# Patient Record
Sex: Male | Born: 1935 | Race: White | Hispanic: No | Marital: Married | Smoking: Former smoker
Health system: Southern US, Community
[De-identification: ages and names within clinical notes are randomized; demographics above are authoritative.]

## PROBLEM LIST (undated history)

## (undated) DIAGNOSIS — M545 Low back pain, unspecified: Secondary | ICD-10-CM

## (undated) DIAGNOSIS — N189 Chronic kidney disease, unspecified: Secondary | ICD-10-CM

## (undated) DIAGNOSIS — Z8679 Personal history of other diseases of the circulatory system: Secondary | ICD-10-CM

## (undated) DIAGNOSIS — N1330 Unspecified hydronephrosis: Secondary | ICD-10-CM

## (undated) DIAGNOSIS — R972 Elevated prostate specific antigen [PSA]: Secondary | ICD-10-CM

## (undated) DIAGNOSIS — Z8551 Personal history of malignant neoplasm of bladder: Secondary | ICD-10-CM

## (undated) DIAGNOSIS — D509 Iron deficiency anemia, unspecified: Secondary | ICD-10-CM

## (undated) DIAGNOSIS — K5909 Other constipation: Secondary | ICD-10-CM

## (undated) DIAGNOSIS — Z978 Presence of other specified devices: Secondary | ICD-10-CM

## (undated) DIAGNOSIS — G8929 Other chronic pain: Secondary | ICD-10-CM

## (undated) DIAGNOSIS — N401 Enlarged prostate with lower urinary tract symptoms: Secondary | ICD-10-CM

## (undated) DIAGNOSIS — M1990 Unspecified osteoarthritis, unspecified site: Secondary | ICD-10-CM

## (undated) DIAGNOSIS — L853 Xerosis cutis: Secondary | ICD-10-CM

## (undated) DIAGNOSIS — Z973 Presence of spectacles and contact lenses: Secondary | ICD-10-CM

## (undated) DIAGNOSIS — N138 Other obstructive and reflux uropathy: Secondary | ICD-10-CM

## (undated) DIAGNOSIS — R399 Unspecified symptoms and signs involving the genitourinary system: Secondary | ICD-10-CM

## (undated) DIAGNOSIS — J302 Other seasonal allergic rhinitis: Secondary | ICD-10-CM

## (undated) DIAGNOSIS — Z8619 Personal history of other infectious and parasitic diseases: Secondary | ICD-10-CM

## (undated) DIAGNOSIS — C679 Malignant neoplasm of bladder, unspecified: Secondary | ICD-10-CM

## (undated) DIAGNOSIS — N402 Nodular prostate without lower urinary tract symptoms: Secondary | ICD-10-CM

## (undated) DIAGNOSIS — R4189 Other symptoms and signs involving cognitive functions and awareness: Secondary | ICD-10-CM

## (undated) DIAGNOSIS — Z860100 Personal history of colon polyps, unspecified: Secondary | ICD-10-CM

## (undated) DIAGNOSIS — R339 Retention of urine, unspecified: Secondary | ICD-10-CM

## (undated) DIAGNOSIS — M48061 Spinal stenosis, lumbar region without neurogenic claudication: Secondary | ICD-10-CM

## (undated) HISTORY — PX: OTHER SURGICAL HISTORY: SHX169

## (undated) HISTORY — PX: HERNIA REPAIR: SHX51

---

## 2000-08-17 ENCOUNTER — Encounter (INDEPENDENT_AMBULATORY_CARE_PROVIDER_SITE_OTHER): Payer: Self-pay | Admitting: Specialist

## 2000-08-17 ENCOUNTER — Ambulatory Visit (HOSPITAL_COMMUNITY): Admission: RE | Admit: 2000-08-17 | Discharge: 2000-08-17 | Payer: Self-pay | Admitting: Gastroenterology

## 2003-06-04 ENCOUNTER — Encounter: Payer: Self-pay | Admitting: Internal Medicine

## 2003-06-13 ENCOUNTER — Encounter: Payer: Self-pay | Admitting: Internal Medicine

## 2004-02-26 ENCOUNTER — Ambulatory Visit: Payer: Self-pay | Admitting: Internal Medicine

## 2004-06-02 ENCOUNTER — Ambulatory Visit: Payer: Self-pay | Admitting: Internal Medicine

## 2004-10-30 ENCOUNTER — Encounter: Payer: Self-pay | Admitting: Internal Medicine

## 2005-01-12 ENCOUNTER — Ambulatory Visit: Payer: Self-pay | Admitting: Internal Medicine

## 2005-02-23 ENCOUNTER — Ambulatory Visit: Payer: Self-pay | Admitting: Internal Medicine

## 2005-06-23 ENCOUNTER — Ambulatory Visit: Payer: Self-pay | Admitting: Internal Medicine

## 2005-08-17 ENCOUNTER — Encounter: Payer: Self-pay | Admitting: Internal Medicine

## 2005-09-04 LAB — HM COLONOSCOPY

## 2005-09-30 ENCOUNTER — Ambulatory Visit: Payer: Self-pay | Admitting: Internal Medicine

## 2006-02-17 ENCOUNTER — Ambulatory Visit: Payer: Self-pay | Admitting: Internal Medicine

## 2006-07-21 ENCOUNTER — Ambulatory Visit: Payer: Self-pay | Admitting: Internal Medicine

## 2006-07-21 LAB — CONVERTED CEMR LAB
ALT: 17 units/L (ref 0–40)
AST: 28 units/L (ref 0–37)
Albumin: 3.7 g/dL (ref 3.5–5.2)
Alkaline Phosphatase: 64 units/L (ref 39–117)
Basophils Absolute: 0 10*3/uL (ref 0.0–0.1)
Basophils Relative: 0.4 % (ref 0.0–1.0)
CO2: 32 meq/L (ref 19–32)
Calcium: 9.5 mg/dL (ref 8.4–10.5)
Chloride: 109 meq/L (ref 96–112)
Cholesterol: 189 mg/dL (ref 0–200)
Creatinine, Ser: 0.8 mg/dL (ref 0.4–1.5)
Glucose, Bld: 94 mg/dL (ref 70–99)
Hemoglobin: 14.6 g/dL (ref 13.0–17.0)
LDL Cholesterol: 118 mg/dL — ABNORMAL HIGH (ref 0–99)
MCHC: 34 g/dL (ref 30.0–36.0)
MCV: 92.8 fL (ref 78.0–100.0)
Monocytes Absolute: 0.3 10*3/uL (ref 0.2–0.7)
Monocytes Relative: 6.3 % (ref 3.0–11.0)
Neutro Abs: 3.2 10*3/uL (ref 1.4–7.7)
RDW: 12.7 % (ref 11.5–14.6)
Total CHOL/HDL Ratio: 2.9
Triglycerides: 28 mg/dL (ref 0–149)

## 2006-08-06 ENCOUNTER — Ambulatory Visit: Payer: Self-pay | Admitting: Internal Medicine

## 2006-12-02 ENCOUNTER — Encounter: Payer: Self-pay | Admitting: Internal Medicine

## 2007-01-06 DIAGNOSIS — N400 Enlarged prostate without lower urinary tract symptoms: Secondary | ICD-10-CM | POA: Insufficient documentation

## 2007-01-06 DIAGNOSIS — Z86010 Personal history of colonic polyps: Secondary | ICD-10-CM | POA: Insufficient documentation

## 2007-02-08 ENCOUNTER — Telehealth: Payer: Self-pay | Admitting: Internal Medicine

## 2007-02-11 ENCOUNTER — Telehealth: Payer: Self-pay | Admitting: Internal Medicine

## 2007-02-15 ENCOUNTER — Ambulatory Visit: Payer: Self-pay | Admitting: Internal Medicine

## 2007-02-15 DIAGNOSIS — M542 Cervicalgia: Secondary | ICD-10-CM

## 2007-07-21 ENCOUNTER — Encounter: Payer: Self-pay | Admitting: Internal Medicine

## 2007-07-22 ENCOUNTER — Ambulatory Visit: Payer: Self-pay | Admitting: Internal Medicine

## 2007-07-22 DIAGNOSIS — R498 Other voice and resonance disorders: Secondary | ICD-10-CM | POA: Insufficient documentation

## 2007-07-22 LAB — CONVERTED CEMR LAB
ALT: 18 units/L (ref 0–53)
AST: 25 units/L (ref 0–37)
Alkaline Phosphatase: 59 units/L (ref 39–117)
BUN: 12 mg/dL (ref 6–23)
CO2: 32 meq/L (ref 19–32)
Eosinophils Relative: 2.3 % (ref 0.0–5.0)
GFR calc non Af Amer: 100 mL/min
Lymphocytes Relative: 28.1 % (ref 12.0–46.0)
MCHC: 34 g/dL (ref 30.0–36.0)
Monocytes Absolute: 0.4 10*3/uL (ref 0.1–1.0)
Neutro Abs: 3.4 10*3/uL (ref 1.4–7.7)
Platelets: 202 10*3/uL (ref 150–400)
Potassium: 4.1 meq/L (ref 3.5–5.1)
RBC: 4.58 M/uL (ref 4.22–5.81)
Sodium: 140 meq/L (ref 135–145)
Total Bilirubin: 1.1 mg/dL (ref 0.3–1.2)
Total Protein: 6.9 g/dL (ref 6.0–8.3)
WBC: 5.4 10*3/uL (ref 4.5–10.5)

## 2007-07-29 ENCOUNTER — Telehealth: Payer: Self-pay | Admitting: Internal Medicine

## 2007-08-01 ENCOUNTER — Encounter: Admission: RE | Admit: 2007-08-01 | Discharge: 2007-08-24 | Payer: Self-pay | Admitting: Internal Medicine

## 2007-08-12 ENCOUNTER — Encounter: Payer: Self-pay | Admitting: Internal Medicine

## 2007-11-13 DIAGNOSIS — L02229 Furuncle of trunk, unspecified: Secondary | ICD-10-CM

## 2007-11-18 ENCOUNTER — Ambulatory Visit: Payer: Self-pay | Admitting: Family Medicine

## 2007-11-19 ENCOUNTER — Encounter: Payer: Self-pay | Admitting: Internal Medicine

## 2007-11-22 ENCOUNTER — Telehealth: Payer: Self-pay | Admitting: Internal Medicine

## 2007-11-29 ENCOUNTER — Encounter: Payer: Self-pay | Admitting: Internal Medicine

## 2007-12-20 ENCOUNTER — Ambulatory Visit: Payer: Self-pay | Admitting: Internal Medicine

## 2007-12-20 DIAGNOSIS — M5430 Sciatica, unspecified side: Secondary | ICD-10-CM | POA: Insufficient documentation

## 2007-12-21 ENCOUNTER — Ambulatory Visit: Payer: Self-pay | Admitting: Family Medicine

## 2007-12-21 DIAGNOSIS — S336XXA Sprain of sacroiliac joint, initial encounter: Secondary | ICD-10-CM | POA: Insufficient documentation

## 2007-12-28 ENCOUNTER — Telehealth: Payer: Self-pay | Admitting: Internal Medicine

## 2008-01-16 ENCOUNTER — Encounter: Payer: Self-pay | Admitting: Internal Medicine

## 2008-01-19 ENCOUNTER — Ambulatory Visit: Payer: Self-pay | Admitting: Internal Medicine

## 2008-02-21 ENCOUNTER — Encounter: Payer: Self-pay | Admitting: Internal Medicine

## 2008-07-31 ENCOUNTER — Ambulatory Visit: Payer: Self-pay | Admitting: Internal Medicine

## 2008-08-08 ENCOUNTER — Telehealth: Payer: Self-pay | Admitting: Internal Medicine

## 2008-10-01 ENCOUNTER — Encounter: Payer: Self-pay | Admitting: Internal Medicine

## 2008-11-22 ENCOUNTER — Encounter: Payer: Self-pay | Admitting: Internal Medicine

## 2009-01-31 ENCOUNTER — Ambulatory Visit: Payer: Self-pay | Admitting: Internal Medicine

## 2009-01-31 DIAGNOSIS — M1990 Unspecified osteoarthritis, unspecified site: Secondary | ICD-10-CM

## 2009-09-20 ENCOUNTER — Telehealth: Payer: Self-pay

## 2009-09-20 ENCOUNTER — Ambulatory Visit: Payer: Self-pay | Admitting: Internal Medicine

## 2009-09-20 DIAGNOSIS — R0989 Other specified symptoms and signs involving the circulatory and respiratory systems: Secondary | ICD-10-CM

## 2009-09-23 ENCOUNTER — Encounter: Payer: Self-pay | Admitting: Internal Medicine

## 2009-09-23 ENCOUNTER — Ambulatory Visit: Payer: Self-pay

## 2009-10-04 ENCOUNTER — Ambulatory Visit: Payer: Self-pay | Admitting: Internal Medicine

## 2009-10-04 DIAGNOSIS — L738 Other specified follicular disorders: Secondary | ICD-10-CM

## 2009-10-16 ENCOUNTER — Encounter: Payer: Self-pay | Admitting: Internal Medicine

## 2009-11-12 ENCOUNTER — Telehealth: Payer: Self-pay | Admitting: Family Medicine

## 2009-11-13 ENCOUNTER — Ambulatory Visit: Payer: Self-pay | Admitting: Family Medicine

## 2009-11-13 ENCOUNTER — Telehealth (INDEPENDENT_AMBULATORY_CARE_PROVIDER_SITE_OTHER): Payer: Self-pay | Admitting: *Deleted

## 2009-11-13 DIAGNOSIS — R071 Chest pain on breathing: Secondary | ICD-10-CM

## 2009-12-26 ENCOUNTER — Telehealth: Payer: Self-pay | Admitting: Internal Medicine

## 2010-01-30 ENCOUNTER — Ambulatory Visit: Payer: Self-pay | Admitting: Internal Medicine

## 2010-04-21 ENCOUNTER — Telehealth: Payer: Self-pay | Admitting: Internal Medicine

## 2010-04-21 ENCOUNTER — Ambulatory Visit
Admission: RE | Admit: 2010-04-21 | Discharge: 2010-04-21 | Payer: Self-pay | Source: Home / Self Care | Attending: Internal Medicine | Admitting: Internal Medicine

## 2010-04-21 DIAGNOSIS — J069 Acute upper respiratory infection, unspecified: Secondary | ICD-10-CM | POA: Insufficient documentation

## 2010-05-04 LAB — CONVERTED CEMR LAB
ALT: 16 units/L (ref 0–53)
AST: 23 units/L (ref 0–37)
AST: 24 units/L (ref 0–37)
Albumin: 4.1 g/dL (ref 3.5–5.2)
Alkaline Phosphatase: 67 units/L (ref 39–117)
Alkaline Phosphatase: 74 units/L (ref 39–117)
BUN: 14 mg/dL (ref 6–23)
Basophils Absolute: 0 10*3/uL (ref 0.0–0.1)
Basophils Absolute: 0 10*3/uL (ref 0.0–0.1)
Basophils Relative: 0 % (ref 0.0–3.0)
CO2: 31 meq/L (ref 19–32)
CO2: 31 meq/L (ref 19–32)
Chloride: 108 meq/L (ref 96–112)
Creatinine, Ser: 0.8 mg/dL (ref 0.4–1.5)
Direct LDL: 124.7 mg/dL
Eosinophils Absolute: 0.2 10*3/uL (ref 0.0–0.7)
Eosinophils Relative: 2.2 % (ref 0.0–5.0)
Eosinophils Relative: 2.6 % (ref 0.0–5.0)
GFR calc non Af Amer: 103.84 mL/min (ref >60)
Glucose, Bld: 89 mg/dL (ref 70–99)
HCT: 41.5 % (ref 39.0–52.0)
HCT: 42.4 % (ref 39.0–52.0)
Hemoglobin: 14 g/dL (ref 13.0–17.0)
Lymphs Abs: 1.5 10*3/uL (ref 0.7–4.0)
MCV: 92.6 fL (ref 78.0–100.0)
Monocytes Absolute: 0.3 10*3/uL (ref 0.1–1.0)
Monocytes Absolute: 0.3 10*3/uL (ref 0.1–1.0)
Monocytes Relative: 5.7 % (ref 3.0–12.0)
Neutro Abs: 3.8 10*3/uL (ref 1.4–7.7)
Neutrophils Relative %: 64.7 % (ref 43.0–77.0)
PSA: 3.64 ng/mL (ref 0.10–4.00)
Platelets: 174 10*3/uL (ref 150.0–400.0)
Potassium: 4 meq/L (ref 3.5–5.1)
Potassium: 4.7 meq/L (ref 3.5–5.1)
RBC: 4.58 M/uL (ref 4.22–5.81)
RDW: 13 % (ref 11.5–14.6)
Sodium: 144 meq/L (ref 135–145)
TSH: 3.33 microintl units/mL (ref 0.35–5.50)
Total CHOL/HDL Ratio: 3
Total CHOL/HDL Ratio: 3
Triglycerides: 39 mg/dL (ref 0.0–149.0)

## 2010-05-06 NOTE — Progress Notes (Signed)
Summary: Call a nurse    [place] Triage Call Report Triage Record Num: [id number] Operator: Lynden Oxford Patient Name: Benjamin Cardenas Call Date & Time: 12/26/2009 2:18:41AM Patient Phone: PCP: Marletta Lor Patient Gender: Male PCP Fax : [phone] Patient DOB: 07/04/1929 Practice Name: [facility] Reason for Call: Pt says he ate a grape about 45 mins ago, feels like something may be stuck in his throat. Can swallow ok and no breathing problems/ or pain. Advised to eat something soft and drink flds. Pt to call back if no relief. Protocol(s) Used: Swallowing Difficulty Recommended Outcome per Protocol: See Provider within 2 Weeks Reason for Outcome: All other situations Care Advice:  ~

## 2010-05-06 NOTE — Assessment & Plan Note (Signed)
Summary: ?boil in genital area/cjr/pt rescd per kim//ccm   Vital Signs:  Patient profile:   77 year old male Weight:      164 pounds Temp:     97.5 degrees F oral BP sitting:   110 / 68  (left arm) Cuff size:   regular  Vitals Entered By: Cay Schillings LPN (July  1, 624THL QA348G PM) CC: c/o ?boil in (L) groin area Is Patient Diabetic? No   CC:  c/o ?boil in (L) groin area.  History of Present Illness: 77 year old patient who is concerned about a small lesion and on the skin on his scrotal region.  He notices last night.  There's been no pain or local inflammation.  There's been no drainage.  He was seen last month for a physical and results of his lab as well as a aorta.  Ultrasound reviewed. He has a history of osteo-arthritis, and neck pain, which have been fairly stable.  No other new concerns or complaints  Allergies: 1)  ! * Z Pack 2)  ! * Plague Vaccine 3)  ! Amoxicillin 4)  ! Sulfa 5)  Cipro 6)  Doxycycline Hyclate (Doxycycline Hyclate) 7)  Levaquin (Levofloxacin)  Past History:  Past Medical History: Reviewed history from 01/31/2009 and no changes required. High cholesterol Colonic polyps, hx of Benign prostatic hypertrophy neck pain left hip extensor, weakness Osteoarthritis  Physical Exam  General:  Well-developed,well-nourished,in no acute distress; alert,appropriate and cooperative throughout examination Skin:  small 3-mm papule, involving the scrotal skin on the left; appeared most consistent with small area of folliculitis   Impression & Recommendations:  Problem # 1:  FOLLICULITIS (0000000) local skin care discussed  Problem # 2:  OSTEOARTHRITIS (ICD-715.90)  His updated medication list for this problem includes:    Aspirin 81 Mg Tbec (Aspirin) .[name]... Take 1 tablet by mouth once a day  His updated medication list for this problem includes:    Aspirin 81 Mg Tbec (Aspirin) .[name]... Take 1 tablet by mouth once a day  His updated medication list  for this problem includes:    Aspirin 81 Mg Tbec (Aspirin) .[name]... Take 1 tablet by mouth once a day  Complete Medication List: 1)  Aspirin 81 Mg Tbec (Aspirin) .... Take 1 tablet by mouth once a day 2)  Fexofenadine Hcl 180 Mg Tabs (Fexofenadine hcl) .[name].. 1 once daily 3)  Naphcon 0.012 % Soln (Naphazoline hcl) 4)  Fish Oil 1200 Mg Caps (Omega-3 fatty acids) .... Qd 5)  Saw Palmetto 500 Mg Caps (Saw palmetto (serenoa repens)) .... Qd 6)  Westcort 0.2 % Oint (Hydrocortisone valerate) .... Prn 7)  Vitamin E 200 Unit Caps (Vitamin e) .... Qd  Patient Instructions: 1)  Please schedule a follow-up appointment in 1 year.

## 2010-05-06 NOTE — Progress Notes (Signed)
Summary: xray results  Phone Note Call from Patient Call back at Home Phone [phone]   Caller: Patient Call For: Marletta Lor  MD Summary of Call: pt would like xray result Initial call taken by: Glo Herring,  November 13, 2009 3:31 PM  Follow-up for Phone Call        Informed pt Follow-up by: Gardenia Phlegm RMA,  November 13, 2009 4:04 PM

## 2010-05-06 NOTE — Progress Notes (Signed)
Summary: hurt ribs & into back k pat  Phone Note Call from Patient Call back at Home Phone [phone]   Caller: vm Call For: todd for k Summary of Call: Hurt left ribs to back yesterday, leaning on hard surface & stretching up to retrrieve something.  Xray?  Ligaments or ribs?  Heard a sound when I did it.  Aches ribs & back.  Xray left ribs & back?  Or does he have to see you first? Shelbie Hutching, RN  November 12, 2009 4:15 PM  Initial call taken by: Shelbie Hutching, RN,  November 12, 2009 3:53 PM  Follow-up for Phone Call        off his visit tomorrow with doctor blythe Follow-up by: Dorena Cookey MD,  November 12, 2009 4:21 PM  Additional Follow-up for Phone Call Additional follow up Details #1::        Phone Call Completed Additional Follow-up by: Shelbie Hutching, RN,  November 12, 2009 4:57 PM

## 2010-05-06 NOTE — Progress Notes (Signed)
Summary: changes to med list       New/Updated Medications: FISH OIL 1200 MG CAPS (OMEGA-3 FATTY ACIDS) qd VITAMIN E 200 UNIT CAPS (VITAMIN E) qd

## 2010-05-06 NOTE — Miscellaneous (Signed)
Summary: Orders Update  Clinical Lists Changes  Problems: Added new problem of OTHER SYMPTOMS INVOLVING CARDIOVASCULAR SYSTEM (ICD-785.9) Orders: Added new Test order of Abdominal Aorta Duplex (Abd Aorta Duplex) - Signed

## 2010-05-06 NOTE — Letter (Signed)
Summary: [facility]  [facility]   Imported By: Laural Benes 10/21/2009 14:24:27  _____________________________________________________________________  External Attachment:    Type:   Image     Comment:   External Document

## 2010-05-06 NOTE — Assessment & Plan Note (Signed)
Summary: pt will come in fasting/njr/pt rsc from bmp/cjr/pt rescd from...   Vital Signs:  Patient profile:   77 year old male Height:      65.5 inches Weight:      162 pounds Temp:     97.6 degrees F oral BP sitting:   110 / 70  (left arm) Cuff size:   regular  Vitals Entered By: Clearnce Sorrel CMA (September 20, 2009 9:39 AM) CC: CPX fasting   CC:  CPX fasting.  History of Present Illness: 77 year old patient who is seen today for a comprehensive evaluation.  He has a history of colonic polyps osteoarthritis.  Neck pain.  BPH.  He is followed by urology annually and is scheduled for a recheck next month.  He has no concerns or complaints today. Here for Medicare AWV:  1.   Risk factors based on Past M, S, F history: no significant cardiovascular risk factors.  Family history is positive for colon cancer in a first-degree relative 2.   Physical Activities: exercises regularly 3 to 4 times per week.  This includes walking and swimming 3.   Depression/mood: no history of depression or mood disorder 4.   Hearing: no deficits 5.   ADL's: completely independent in all aspects of daily living 6.   Fall Risk: low 7.   Home Safety: no problems identified 8.   Height, weight, &visual acuity:no change in height, or weight no difficulties with visual acuity has had a recent eye exam 9.   Counseling:  heart healthy diet, ongoing regular exercise regimen discussed and encouraged 10.   Labs ordered based on risk factors: complete laboratory profile, including PSA, and lipid profile will be reviewed 11.           Referral Coordination- follow-up with the urology will need a follow-up colonoscopy in one year 48.           Care Plan- continued heart healthy diet regular.  Exercise regimen 13.            Cognitive Assessment- alert and oriented, with normal affect   Allergies: 1)  ! * Z Pack 2)  ! * Plague Vaccine 3)  ! Amoxicillin 4)  ! Sulfa 5)  Cipro 6)  Doxycycline Hyclate (Doxycycline  Hyclate) 7)  Levaquin (Levofloxacin)  Past History:  Past Medical History: Reviewed history from 01/31/2009 and no changes required. High cholesterol Colonic polyps, hx of Benign prostatic hypertrophy neck pain left hip extensor, weakness Osteoarthritis  Past Surgical History: Reviewed history from 07/31/2008 and no changes required. Colon polypectomy status post right angle hernia repair at age 76 colonoscopy 2007 negative for treadmill ETT May 2008  Family History: Reviewed history from 07/22/2007 and no changes required. Family History Breast cancer 1st degree relative <50 Family History of Colon CA 1st degree relative <60 Family History of Stroke F 1st degree relative <60 Family History of Cardiovascular disorder father died at 60, MI mother died age 35 complications of cerebrovascular disease, pneumonia; history of congestive heart failure  One brother.  History colon cancer, status post CABG one sister died of breast cancer at age 36  Social History: Reviewed history from 01/06/2007 and no changes required. Occupation: Married Former Smoker  Review of Systems  The patient denies anorexia, fever, weight loss, weight gain, vision loss, decreased hearing, hoarseness, chest pain, syncope, dyspnea on exertion, peripheral edema, prolonged cough, headaches, hemoptysis, abdominal pain, melena, hematochezia, severe indigestion/heartburn, hematuria, incontinence, genital sores, muscle weakness, suspicious skin lesions, transient blindness, difficulty  walking, depression, unusual weight change, abnormal bleeding, enlarged lymph nodes, angioedema, breast masses, and testicular masses.    Physical Exam  General:  Well-developed,well-nourished,in no acute distress; alert,appropriate and cooperative throughout examination Head:  Normocephalic and atraumatic without obvious abnormalities. No apparent alopecia or balding. Eyes:  No corneal or conjunctival inflammation noted. EOMI.  Perrla. Funduscopic exam benign, without hemorrhages, exudates or papilledema. Vision grossly normal. Ears:  External ear exam shows no significant lesions or deformities.  Otoscopic examination reveals clear canals, tympanic membranes are intact bilaterally without bulging, retraction, inflammation or discharge. Hearing is grossly normal bilaterally. Nose:  External nasal examination shows no deformity or inflammation. Nasal mucosa are pink and moist without lesions or exudates. Mouth:  Oral mucosa and oropharynx without lesions or exudates.  Teeth in good repair. Neck:  No deformities, masses, or tenderness noted. Chest Wall:  No deformities, masses, tenderness or gynecomastia noted. Breasts:  No masses or gynecomastia noted Lungs:  Normal respiratory effort, chest expands symmetrically. Lungs are clear to auscultation, no crackles or wheezes. Heart:  Normal rate and regular rhythm. S1 and S2 normal without gallop, murmur, click, rub or other extra sounds. Abdomen:  Bowel sounds positive,abdomen soft and non-tender without masses, organomegaly or hernias noted.  prominent aortic pulsation Rectal:  per urology Genitalia:  Testes bilaterally descended without nodularity, tenderness or masses. No scrotal masses or lesions. No penis lesions or urethral discharge. Prostate:  per urology Msk:  No deformity or scoliosis noted of thoracic or lumbar spine.   Pulses:  R and L carotid,radial,femoral,dorsalis pedis and posterior tibial pulses are full and equal bilaterally Extremities:  No clubbing, cyanosis, edema, or deformity noted with normal full range of motion of all joints.   Neurologic:  No cranial nerve deficits noted. Station and gait are normal. Plantar reflexes are down-going bilaterally. DTRs are symmetrical throughout except for diminished left patellar reflex. Sensory, motor and coordinative functions appear intact. Skin:  Intact without suspicious lesions or rashes Cervical Nodes:  No  lymphadenopathy noted Axillary Nodes:  No palpable lymphadenopathy Inguinal Nodes:  No significant adenopathy Psych:  Cognition and judgment appear intact. Alert and cooperative with normal attention span and concentration. No apparent delusions, illusions, hallucinations   Impression & Recommendations:  Problem # 1:  Benjamin Cardenas (ICD-V70.0)  Orders: EKG w/ Interpretation (93000) First annual wellness visit with prevention plan  [id number]) Venipuncture [id number]) TLB-Lipid Panel (80061-LIPID) TLB-BMP (Basic Metabolic Panel-BMET) (99991111) TLB-CBC Platelet - w/Differential (85025-CBCD) TLB-Hepatic/Liver Function Pnl (80076-HEPATIC) TLB-TSH (Thyroid Stimulating Hormone) (84443-TSH) TLB-PSA (Prostate Specific Antigen) (84153-PSA) [facility] ([facility]ascular)  Complete Medication List: 1)  Aspirin 81 Mg Tbec (Aspirin) .... Take 1 tablet by mouth once a day 2)  Fexofenadine Hcl 180 Mg Tabs (Fexofenadine hcl) .[name].. 1 once daily 3)  Naphcon 0.012 % Soln (Naphazoline hcl) 4)  Fish Oil 1000 Mg Caps (Omega-3 fatty acids) .... Qd 5)  Vita-plus E 400 Unit Caps (Vitamin e) .... Qd 6)  Saw Palmetto 500 Mg Caps (Saw palmetto (serenoa repens)) .... Qd 7)  Westcort 0.2 % Oint (Hydrocortisone valerate) .... Prn  Patient Instructions: 1)  Limit your Sodium (Salt). 2)  It is important that you exercise regularly at least 20 minutes 5 times a week. If you develop chest pain, have severe difficulty breathing, or feel very tired , stop exercising immediately and seek medical attention. 3)  Take calcium +Vitamin D daily. 4)  urology follow-up as scheduled Prescriptions: FEXOFENADINE HCL 180 MG TABS (FEXOFENADINE HCL) 1 once daily  #90 x 6  Entered and Authorized by:   Marletta Lor  MD   Signed by:   Marletta Lor  MD on 09/20/2009   Method used:   Electronically to        [facility]  [phone]* (retail)       [place], [place]  [address]       Ph:  [phone] or [phone]       Fax: [phone]   RxID:   [id number]

## 2010-05-06 NOTE — Assessment & Plan Note (Signed)
Summary: FLU SHOT//SLM  Nurse Visit   Allergies: 1)  ! * Z Pack 2)  ! * Plague Vaccine 3)  ! Amoxicillin 4)  ! Sulfa 5)  Cipro 6)  Doxycycline Hyclate (Doxycycline Hyclate) 7)  Levaquin (Levofloxacin)  Review of Systems       Flu Vaccine Consent Questions     Do you have a history of severe allergic reactions to this vaccine? no    Any prior history of allergic reactions to egg and/or gelatin? no    Do you have a sensitivity to the preservative Thimersol? no    Do you have a past history of Guillan-Barre Syndrome? no    Do you currently have an acute febrile illness? no    Have you ever had a severe reaction to latex? no    Vaccine information given and explained to patient? yes    Are you currently pregnant? no    Lot Number:[id number]   Exp Date:10/04/2010   Site Given  Left Deltoid IM    Orders Added: 1)  Flu Vaccine 20yrs + MEDICARE PATIENTS [Q2039] 2)  Administration Flu vaccine - MCR [id number]

## 2010-05-06 NOTE — Assessment & Plan Note (Signed)
Summary: FRACTURED RIBS/BACK ACHING ALSO/K PT/PS   Vital Signs:  Patient profile:   77 year old male Height:      65.5 inches (166.37 cm) Weight:      164 pounds (74.55 kg) BMI:     26.97 O2 Sat:      98 % on Room air Temp:     98.1 degrees F (36.72 degrees C) oral Pulse rate:   56 / minute BP sitting:   124 / 62  (left arm) Cuff size:   regular  Vitals Entered By: Gardenia Phlegm RMA (November 13, 2009 9:13 AM)  O2 Flow:  Room air CC: Possible fractured ribs on left side (pt states he slipped 2 days ago)/ Back aching on left side/ CF Is Patient Diabetic? No   History of Present Illness: Patient in today with concerns over some pain over his left thorax. 2 days ago he was reaching over a countertop to grab something when he lost his balance, slipped and fell against the counter and hit his left lateral chest wall. He felt or heard (he is not sure) a crunching sensation and it has hurt ever since. He can find a comfortable position sitting or lying down where it does not bother him but with certain movements/twisting/bending the paincan get sharp and worse. He denies any SOB/cough/wheeze/palp/f/c/congestion/GI or GU c/o.  Current Medications (verified): 1)  Aspirin 81 Mg Tbec (Aspirin) .... Take 1 Tablet By Mouth Once A Day 2)  Fexofenadine Hcl 180 Mg Tabs (Fexofenadine Hcl) .[name].. 1 Once Daily 3)  Naphcon 0.012 %  Soln (Naphazoline Hcl) 4)  Fish Oil 1200 Mg Caps (Omega-3 Fatty Acids) .... Qd 5)  Saw Palmetto 500 Mg Caps (Saw Palmetto (Serenoa Repens)) .... Qd 6)  Westcort 0.2 % Oint (Hydrocortisone Valerate) .... Prn 7)  Vitamin E 200 Unit Caps (Vitamin E) .... Qd  Allergies (verified): 1)  ! * Z Pack 2)  ! * Plague Vaccine 3)  ! Amoxicillin 4)  ! Sulfa 5)  Cipro 6)  Doxycycline Hyclate (Doxycycline Hyclate) 7)  Levaquin (Levofloxacin)  Past History:  Past medical history reviewed for relevance to current acute and chronic problems. Social history (including risk factors)  reviewed for relevance to current acute and chronic problems.  Past Medical History: Reviewed history from 01/31/2009 and no changes required. High cholesterol Colonic polyps, hx of Benign prostatic hypertrophy neck pain left hip extensor, weakness Osteoarthritis  Social History: Reviewed history from 01/06/2007 and no changes required. Occupation: Married Former Smoker  Review of Systems      See HPI  Physical Exam  General:  Well-developed,well-nourished,in no acute distress; alert,appropriate and cooperative throughout examination Head:  Normocephalic and atraumatic without obvious abnormalities. No apparent alopecia or balding. Mouth:  Oral mucosa and oropharynx without lesions or exudates.  Teeth in good repair. Neck:  No deformities, masses, or tenderness noted. Lungs:  Normal respiratory effort, chest expands symmetrically. Lungs are clear to auscultation, no crackles or wheezes. Heart:  Normal rate and regular rhythm. S1 and S2 normal without gallop, murmur, click, rub or other extra sounds. Abdomen:  Bowel sounds positive,abdomen soft and non-tender without masses, organomegaly or hernias noted. Msk:  Pain with palpation noted over ribs 5-10 from below his scapula around to the front midclavicular line. No crepitus or bony abnormality palpated Extremities:  No clubbing, cyanosis, edema, or deformity noted s.   Skin:  Intact without suspicious lesions or rashes Psych:  Cognition and judgment appear intact. Alert and cooperative with normal attention  span and concentration. No apparent delusions, illusions, hallucinations   Impression & Recommendations:  Problem # 1:  CHEST WALL PAIN, ACUTE (ICD-786.52)  His updated medication list for this problem includes:    Aspirin 81 Mg Tbec (Aspirin) .[name]... Take 1 tablet by mouth once a day    Meloxicam 7.5 Mg Tabs (Meloxicam) .[name]... 1 tab by mouth daily as needed pain  Orders: T-Ribs Unilateral 2 Views (71100TC) s/p trauma.  Given samples of Flector patches to use apply 1 to painful area every 12 hours as needed. May try the meloxicam when the Flector is gone, if inadequate relief can call for more Flector. If no fracture is found on xray may resume normal activity as tolerated. If fracture noted should refrain from excessive lifting , bending and twisting for next 2-4 weeks.  Complete Medication List: 1)  Aspirin 81 Mg Tbec (Aspirin) .... Take 1 tablet by mouth once a day 2)  Fexofenadine Hcl 180 Mg Tabs (Fexofenadine hcl) .[name].. 1 once daily 3)  Naphcon 0.012 % Soln (Naphazoline hcl) 4)  Fish Oil 1200 Mg Caps (Omega-3 fatty acids) .... Qd 5)  Saw Palmetto 500 Mg Caps (Saw palmetto (serenoa repens)) .... Qd 6)  Westcort 0.2 % Oint (Hydrocortisone valerate) .... Prn 7)  Vitamin E 200 Unit Caps (Vitamin e) .... Qd 8)  Meloxicam 7.5 Mg Tabs (Meloxicam) .[name].. 1 tab by mouth daily as needed pain 9)  Flector 1.3 % Ptch (Diclofenac epolamine) .[name].. 1 patch applied topically to painful area every 12 hours as needed. may alternate with meloxicam  Patient Instructions: 1)  Please schedule a follow-up appointment as needed if symptoms worsen or do not improve. 2)  Try Flector patches first, change every 12 hours, samples provided, when samples gone try Meloxicam tablets for pain daily if painis peristent. If pain persists and Meloxicam does not work as well as Research officer, political party we can call in a prescription for them. 3)  Take 650 - 1000 mg of tylenol every 4-6 hours as needed for relief of pain or comfort of fever. Avoid taking more than 3000 mg in a 24 hour period( can cause liver damage in higher doses).  4)  Avoid heavy lifting, twisting, bending until the xray results are available Prescriptions: FLECTOR 1.3 % PTCH (DICLOFENAC EPOLAMINE) 1 patch applied topically to painful area every 12 hours as needed. May alternate with Meloxicam  #6 x 0   Entered and Authorized by:   Penni Homans MD   Signed by:   Penni Homans MD on  11/13/2009   Method used:   Samples Given   RxID:   [id number] MELOXICAM 7.5 MG TABS (MELOXICAM) 1 tab by mouth daily as needed pain  #30 x 1   Entered and Authorized by:   Penni Homans MD   Signed by:   Penni Homans MD on 11/13/2009   Method used:   Electronically to        [facility]  [phone]* (retail)       [place], [place]  [address]       Ph: [phone] or [phone]       Fax: [phone]   RxID:   [id number]

## 2010-05-08 NOTE — Progress Notes (Signed)
Summary: Call A Nurse   [place] Triage Call Report Triage Record Num: [id number] Operator: Jeanett Schlein Patient Name: Benjamin Cardenas Call Date & Time: 04/20/2010 10:34:17AM Patient Phone: [phone] PCP: Marletta Lor Patient Gender: Male PCP Fax : [phone] Patient DOB: 1929-04-13 Practice Name: [facility] Reason for Call: Pt calling, has had some sinus issues for a week. Had yellow nasal drainage. Last night 1/14, he woke up at least 4 times with "almost choking with the mucous". He would cough up a TBSP yellow/brown mucous. Has a temp 99.3. Needs to be seen in 24 hours. Has Mucinex and will start to take that as directed. Protocol(s) Used: Cough - Adult Recommended Outcome per Protocol: See Provider within 24 hours Reason for Outcome: Productive cough with colored sputum (other than clear or white sputum) Care Advice: Increase fluids to 8-12 eight oz (1.6 to 2.4 liters) glasses per day, half of them to be water. Soups, popsicles, fruit juices, non-caffeinated sodas (unless restricting sodium intake), jello, broths, decaf teas, etc. are all okay. Warm fluids can be soothing.  ~  ~ SYMPTOM / CONDITION MANAGEMENT Coughing up mucus or phlegm helps to get rid of an infection. A productive cough should not be stopped. A cough medicine with guaifenesin (Robitussin, Mucinex) can help loosen the mucus. Cough medicine with dextromethorphan (DM) should be avoided. Drinking lots of fluids can help loosen the mucus too, especially warm fluids.  ~ 04/20/2010 10:42:39AM Page 1 of 1 [id number]

## 2010-05-08 NOTE — Assessment & Plan Note (Signed)
Summary: chest congestion//ccm   Vital Signs:  Patient profile:   77 year old male Weight:      166 pounds Temp:     97.5 degrees F oral BP sitting:   120 / 80  (left arm) Cuff size:   regular  Vitals Entered By: Cay Schillings LPN (January 16, [id number] 11:03 AM) CC: c/o chest congestion , productive cough Is Patient Diabetic? No   CC:  c/o chest congestion  and productive cough.  History of Present Illness: 77 year old patient, who presents today with a 3-day history of head and chest congestion.  He has had reductive cough that has largely resolved.  His sinus congestion also improved.  Over the weekend.  He had temperature as high as 100.9 degrees, but this has normalized today.  He feels greatly improved.  He has been using Mucinex, with nice benefit. He has a history of arthritis, which has been stable and reasonably well-controlled on meloxicam  Preventive Screening-Counseling & Management  Alcohol-Tobacco     Smoking Status: quit  Allergies: 1)  ! * Z Pack 2)  ! * Plague Vaccine 3)  ! Amoxicillin 4)  ! Sulfa 5)  Cipro (Ciprofloxacin) 6)  Doxycycline Hyclate (Doxycycline Hyclate) 7)  Levaquin (Levofloxacin)  Past History:  Past Medical History: Reviewed history from 01/31/2009 and no changes required. High cholesterol Colonic polyps, hx of Benign prostatic hypertrophy neck pain left hip extensor, weakness Osteoarthritis  Review of Systems       The patient complains of anorexia, fever, and prolonged cough.  The patient denies weight loss, weight gain, vision loss, decreased hearing, hoarseness, chest pain, syncope, dyspnea on exertion, peripheral edema, headaches, hemoptysis, abdominal pain, melena, hematochezia, severe indigestion/heartburn, hematuria, incontinence, genital sores, muscle weakness, suspicious skin lesions, transient blindness, difficulty walking, depression, unusual weight change, abnormal bleeding, enlarged lymph nodes, angioedema, breast  masses, and testicular masses.    Physical Exam  General:  Well-developed,well-nourished,in no acute distress; alert,appropriate and cooperative throughout examination Head:  Normocephalic and atraumatic without obvious abnormalities. No apparent alopecia or balding. Eyes:  No corneal or conjunctival inflammation noted. EOMI. Perrla. Funduscopic exam benign, without hemorrhages, exudates or papilledema. Vision grossly normal. Ears:  External ear exam shows no significant lesions or deformities.  Otoscopic examination reveals clear canals, tympanic membranes are intact bilaterally without bulging, retraction, inflammation or discharge. Hearing is grossly normal bilaterally. Mouth:  Oral mucosa and oropharynx without lesions or exudates.  Teeth in good repair. Neck:  No deformities, masses, or tenderness noted. Lungs:  Normal respiratory effort, chest expands symmetrically. Lungs are clear to auscultation, no crackles or wheezes. Heart:  Normal rate and regular rhythm. S1 and S2 normal without gallop, murmur, click, rub or other extra sounds. Abdomen:  Bowel sounds positive,abdomen soft and non-tender without masses, organomegaly or hernias noted. Msk:  No deformity or scoliosis noted of thoracic or lumbar spine.     Impression & Recommendations:  Problem # 1:  URI (ICD-465.9)  His updated medication list for this problem includes:    Aspirin 81 Mg Tbec (Aspirin) .[name]... Take 1 tablet by mouth once a day    Fexofenadine Hcl 180 Mg Tabs (Fexofenadine hcl) .[name]... 1 once daily    Meloxicam 7.5 Mg Tabs (Meloxicam) .[name]... 1 tab by mouth daily as needed pain  His updated medication list for this problem includes:    Aspirin 81 Mg Tbec (Aspirin) .[name]... Take 1 tablet by mouth once a day    Fexofenadine Hcl 180 Mg Tabs (Fexofenadine hcl) .Marland KitchenMarland KitchenMarland KitchenMarland Kitchen  1 once daily    Meloxicam 7.5 Mg Tabs (Meloxicam) .[name]... 1 tab by mouth daily as needed pain  Problem # 2:  OSTEOARTHRITIS (ICD-715.90)  His updated medication  list for this problem includes:    Aspirin 81 Mg Tbec (Aspirin) .[name]... Take 1 tablet by mouth once a day    Meloxicam 7.5 Mg Tabs (Meloxicam) .[name]... 1 tab by mouth daily as needed pain  His updated medication list for this problem includes:    Aspirin 81 Mg Tbec (Aspirin) .[name]... Take 1 tablet by mouth once a day    Meloxicam 7.5 Mg Tabs (Meloxicam) .[name]... 1 tab by mouth daily as needed pain  Complete Medication List: 1)  Aspirin 81 Mg Tbec (Aspirin) .... Take 1 tablet by mouth once a day 2)  Fexofenadine Hcl 180 Mg Tabs (Fexofenadine hcl) .[name].. 1 once daily 3)  Naphcon 0.012 % Soln (Naphazoline hcl) 4)  Fish Oil 1200 Mg Caps (Omega-3 fatty acids) .... Qd 5)  Saw Palmetto 500 Mg Caps (Saw palmetto (serenoa repens)) .... Qd 6)  Westcort 0.2 % Oint (Hydrocortisone valerate) .... Prn 7)  Vitamin E 200 Unit Caps (Vitamin e) .... Qd 8)  Meloxicam 7.5 Mg Tabs (Meloxicam) .[name].. 1 tab by mouth daily as needed pain 9)  Flector 1.3 % Ptch (Diclofenac epolamine) .[name].. 1 patch applied topically to painful area every 12 hours as needed. may alternate with meloxicam  Patient Instructions: 1)  Get plenty of rest, drink lots of clear liquids, and use Tylenol or Ibuprofen for fever and comfort. Return in 7-10 days if you're not better:sooner if you're feeling worse.   Orders Added: 1)  Est. Patient Level III [id number]

## 2010-05-19 ENCOUNTER — Telehealth: Payer: Self-pay | Admitting: *Deleted

## 2010-05-19 NOTE — Telephone Encounter (Signed)
[place] for both but [employer] may have restrictions such as age

## 2010-05-19 NOTE — Telephone Encounter (Signed)
[place] and [place] both want to give blood due to needs in their family.  Is it ok?

## 2010-05-19 NOTE — Telephone Encounter (Signed)
Notified pts. Of Dr. Marthann Schiller recommendations.

## 2010-08-22 NOTE — Assessment & Plan Note (Signed)
[facility] OFFICE NOTE   NAME:Benjamin Cardenas, Cipres                      MRN:          [id number]  DATE:07/21/2006                            DOB:          Aug 30, 1929    REASON FOR VISIT:  This is a 77 year old gentleman seen today for an  annual exam.  He enjoys excellent health.  He is followed bi-annually by  Dr. Karsten Ro for BPH.  He has a history of mild hypercholesterolemia but  last lipid profile was fairly unremarkable. He has a history of colonic  polyps.  He has been hospitalized in the past for back trauma in 1964,  chest pain in 1973. He has also had a right inguinal hernia repair at  age 55.  He is a former smoker discontinued in 1980.  He takes daily  aspirin, a number of supplements, and Allegra for allergies.   REVIEW OF SYSTEMS:  Review of systems is fairly noncontributory.  He  does have some chronic voice complaints.  He did have colonoscopy in  2007.   FAMILY HISTORY:  Positive for cardiac disease.  Father died at 33.  Brother is status post CABG.  Brother also had colon cancer.   PHYSICAL EXAMINATION:  GENERAL APPEARANCE:  Exam reveals a healthy-  appearing fit male in no acute distress.  VITAL SIGNS: Blood pressure is low normal.  HEENT:  Fundi, ear, nose and throat are clear.  NECK:  No adenopathy, bruits or thyroid enlargement.  CHEST:  Clear.  CARDIOVASCULAR:  Normal heart sounds, no murmurs.  ABDOMEN:  Benign.  GENITOURINARY:  External genitalia normal.  RECTAL:  Exam not repeated.  EXTREMITIES: Full peripheral pulses.  No edema.  NEUROLOGICAL:  Negative.   IMPRESSION:  1. Benign prostatic hypertrophy.  2. Seasonal allergic rhinitis.  3. Mild degenerative joint disease.  4. History of colonic polyps.   DISPOSITION:  Will set up for an exercise stress test at his  convenience.  He will consider an ENT referral.  Otherwise, he will  return in one year for followup.     Marletta Lor, MD  Electronically Signed   PFK/MedQ  DD: 07/21/2006  DT: 07/21/2006  Job #: [id number]

## 2010-09-18 ENCOUNTER — Encounter: Payer: Self-pay | Admitting: Internal Medicine

## 2010-09-23 ENCOUNTER — Encounter: Payer: Self-pay | Admitting: Internal Medicine

## 2010-09-23 ENCOUNTER — Ambulatory Visit (INDEPENDENT_AMBULATORY_CARE_PROVIDER_SITE_OTHER): Payer: Medicare Other | Admitting: Internal Medicine

## 2010-09-23 VITALS — BP 114/80 | HR 70 | Temp 97.5°F | Resp 16 | Ht 66.0 in | Wt 163.0 lb

## 2010-09-23 DIAGNOSIS — Z0000 Encounter for general adult medical examination without abnormal findings: Secondary | ICD-10-CM

## 2010-09-23 DIAGNOSIS — N400 Enlarged prostate without lower urinary tract symptoms: Secondary | ICD-10-CM

## 2010-09-23 DIAGNOSIS — Z86010 Personal history of colonic polyps: Secondary | ICD-10-CM

## 2010-09-23 DIAGNOSIS — M1990 Unspecified osteoarthritis, unspecified site: Secondary | ICD-10-CM

## 2010-09-23 DIAGNOSIS — M542 Cervicalgia: Secondary | ICD-10-CM

## 2010-09-23 DIAGNOSIS — R0989 Other specified symptoms and signs involving the circulatory and respiratory systems: Secondary | ICD-10-CM

## 2010-09-23 LAB — LIPID PANEL
Total CHOL/HDL Ratio: 3
VLDL: 5 mg/dL (ref 0.0–40.0)

## 2010-09-23 LAB — CBC WITH DIFFERENTIAL/PLATELET
Basophils Absolute: 0 10*3/uL (ref 0.0–0.1)
Hemoglobin: 13.6 g/dL (ref 13.0–17.0)
Lymphocytes Relative: 25.7 % (ref 12.0–46.0)
Monocytes Relative: 6.1 % (ref 3.0–12.0)
Neutro Abs: 3.9 10*3/uL (ref 1.4–7.7)
RBC: 4.26 Mil/uL (ref 4.22–5.81)
RDW: 14.2 % (ref 11.5–14.6)

## 2010-09-23 LAB — BASIC METABOLIC PANEL
Calcium: 9.6 mg/dL (ref 8.4–10.5)
GFR: 97.69 mL/min (ref >60.00)
Glucose, Bld: 102 mg/dL — ABNORMAL HIGH (ref 70–99)
Sodium: 143 mEq/L (ref 135–145)

## 2010-09-23 LAB — TSH: TSH: 3.14 u[IU]/mL (ref 0.35–5.50)

## 2010-09-23 LAB — HEPATIC FUNCTION PANEL
AST: 26 U/L (ref 0–37)
Albumin: 4.2 g/dL (ref 3.5–5.2)
Alkaline Phosphatase: 66 U/L (ref 39–117)

## 2010-09-23 MED ORDER — MELOXICAM 7.5 MG PO TABS: 7.5000 mg | ORAL_TABLET | Freq: Every day | ORAL | Status: DC | PRN

## 2010-09-23 NOTE — Patient Instructions (Addendum)
Limit your sodium (Salt) intake    It is important that you exercise regularly, at least 20 minutes 3 to 4 times per week.  If you develop chest pain or shortness of breath seek  medical attention.  Return in one year for follow-up  In

## 2010-09-23 NOTE — Progress Notes (Signed)
Subjective:    Patient ID: Benjamin Cardenas, male    DOB: 1929-10-26, 77 y.o.   MRN: [id number]  HPI  History of Present Illness:  50 -year-old patient who is seen today for a comprehensive evaluation. He has a history of colonic polyps osteoarthritis. Neck pain. BPH. He is followed by urology annually and is scheduled for a recheck next month. He has no concerns or complaints today.   Here for Medicare AWV:  1. Risk factors based on Past M, S, F history: no significant cardiovascular risk factors. Family history is positive for colon cancer in a first-degree relative  2. Physical Activities: exercises regularly 3 to 4 times per week. This includes walking and swimming  3. Depression/mood: no history of depression or mood disorder  4. Hearing: no deficits  5. ADL's: completely independent in all aspects of daily living  6. Fall Risk: low  7. Home Safety: no problems identified  8. Height, weight, &visual acuity:no change in height, or weight no difficulties with visual acuity has had a recent eye exam  9. Counseling: heart healthy diet, ongoing regular exercise regimen discussed and encouraged  10. Labs ordered based on risk factors: complete laboratory profile, including PSA, and lipid profile will be reviewed  11. Referral Coordination- follow-up with the urology will need a follow-up colonoscopy in one year  21. Care Plan- continued heart healthy diet regular. Exercise regimen  13. Cognitive Assessment- alert and oriented, with normal affect   Allergies:  1) ! * Z Pack  2) ! * Plague Vaccine  3) ! Amoxicillin  4) ! Sulfa  5) Cipro  6) Doxycycline Hyclate (Doxycycline Hyclate)  7) Levaquin (Levofloxacin)   Past History:  Past Medical History:  Reviewed history from 01/31/2009 and no changes required.  High cholesterol  Colonic polyps, hx of  Benign prostatic hypertrophy  neck pain  left hip extensor, weakness  Osteoarthritis   Past Surgical History:  Reviewed history from  07/31/2008 and no changes required.  Colon polypectomy  status post right angle hernia repair at age 84  colonoscopy 2007  negative for treadmill ETT May 2008   Family History:  Reviewed history from 07/22/2007 and no changes required.  Family History Breast cancer 1st degree relative <50  Family History of Colon CA 1st degree relative <60  Family History of Stroke F 1st degree relative <60  Family History of Cardiovascular disorder  father died at 47, MI  mother died age 62 complications of cerebrovascular disease, pneumonia; history of congestive heart failure  One brother. History colon cancer, status post CABG  one sister died of breast cancer at age 11   Social History:  Reviewed history from 01/06/2007 and no changes required.  Occupation:  Married  Former Production designer, theatre/television/film  Past Medical History  Diagnosis Date  . BENIGN PROSTATIC HYPERTROPHY 01/06/2007  . COLONIC POLYPS, HX OF 01/06/2007  . NECK PAIN 02/15/2007  . OSTEOARTHRITIS 01/31/2009  . Other symptoms involving cardiovascular system 09/20/2009   Past Surgical History  Procedure Date  . Hernia repair     does not have a smoking history on file. He does not have any smokeless tobacco history on file. His alcohol and drug histories not on file. family history is not on file. Allergies  Allergen Reactions  . Amoxicillin   . Ciprofloxacin     REACTION: unspecified  . Doxycycline Hyclate     REACTION: unspecified  . Levofloxacin     REACTION: unspecified  . Sulfonamide Derivatives   chx  Review of Systems  Constitutional: Negative for fever, chills, activity change, appetite change and fatigue.  HENT: Negative for hearing loss, ear pain, congestion, rhinorrhea, sneezing, mouth sores, trouble swallowing, neck pain, neck stiffness, dental problem, voice change, sinus pressure and tinnitus.   Eyes: Negative for photophobia, pain, redness and visual disturbance.  Respiratory: Negative for apnea, cough, choking, chest  tightness, shortness of breath and wheezing.   Cardiovascular: Negative for chest pain, palpitations and leg swelling.  Gastrointestinal: Negative for nausea, vomiting, abdominal pain, diarrhea, constipation, blood in stool, abdominal distention, anal bleeding and rectal pain.  Genitourinary: Negative for dysuria, urgency, frequency, hematuria, flank pain, decreased urine volume, discharge, penile swelling, scrotal swelling, difficulty urinating, genital sores and testicular pain.  Musculoskeletal: Negative for myalgias, back pain, joint swelling, arthralgias and gait problem.  Skin: Positive for rash. Negative for color change and wound.       Followed by dermatology for lichen planus  Neurological: Negative for dizziness, tremors, seizures, syncope, facial asymmetry, speech difficulty, weakness, light-headedness, numbness and headaches.  Hematological: Negative for adenopathy. Does not bruise/bleed easily.  Psychiatric/Behavioral: Negative for suicidal ideas, hallucinations, behavioral problems, confusion, sleep disturbance, self-injury, dysphoric mood, decreased concentration and agitation. The patient is not nervous/anxious.        Objective:   Physical Exam  Constitutional: He is oriented to person, place, and time. He appears well-developed and well-nourished.  HENT:  Head: Normocephalic and atraumatic.  Right Ear: External ear normal.  Left Ear: External ear normal.  Nose: Nose normal.  Mouth/Throat: Oropharynx is clear and moist.  Eyes: Conjunctivae and EOM are normal. Pupils are equal, round, and reactive to light. No scleral icterus.  Neck: Normal range of motion. Neck supple. No JVD present. No thyromegaly present.  Cardiovascular: Normal rate, regular rhythm, normal heart sounds and intact distal pulses.  Exam reveals no gallop and no friction rub.   No murmur heard. Pulmonary/Chest: Effort normal and breath sounds normal. He exhibits no tenderness.  Abdominal: Soft. Bowel  sounds are normal. He exhibits no distension and no mass. There is no tenderness.  Genitourinary: Penis normal.       Per urology  Musculoskeletal: Normal range of motion. He exhibits no edema and no tenderness.  Lymphadenopathy:    He has no cervical adenopathy.  Neurological: He is alert and oriented to person, place, and time. He has normal reflexes. No cranial nerve deficit. Coordination normal.  Skin: Skin is warm and dry. No rash noted.  Psychiatric: He has a normal mood and affect. His behavior is normal.          Assessment & Plan:   Annual clinical exam BPH Osteoarthritis History of colonic polyps

## 2011-01-23 ENCOUNTER — Ambulatory Visit (INDEPENDENT_AMBULATORY_CARE_PROVIDER_SITE_OTHER): Payer: Medicare Other | Admitting: Internal Medicine

## 2011-01-23 ENCOUNTER — Encounter: Payer: Self-pay | Admitting: Internal Medicine

## 2011-01-23 VITALS — BP 112/78 | Temp 98.1°F | Wt 155.0 lb

## 2011-01-23 DIAGNOSIS — K4190 Unilateral femoral hernia, without obstruction or gangrene, not specified as recurrent: Secondary | ICD-10-CM

## 2011-01-23 NOTE — Patient Instructions (Signed)
Gen. surgery evaluation as discussed   Call or return to clinic prn if these symptoms worsen or fail to improve as anticipated.   [name]

## 2011-01-23 NOTE — Progress Notes (Signed)
  Subjective:    Patient ID: Benjamin Cardenas, male    DOB: [birth year], 77 y.o.   MRN: [id number]  HPI 77 year old patient who presents with a several day history of increasing pain involving the left groin area. He has recently returned from a trip to the [place] and a considerable discomfort with his activities. He also has a dermatitis and has been [facility]   Review of Systems  Gastrointestinal: Positive for abdominal pain.  Skin: Positive for rash.       Objective:   Physical Exam  Constitutional: He appears well-developed and well-nourished. No distress.  Abdominal: Soft. Bowel sounds are normal.       Left femoral hernia easily reducible          Assessment & Plan:   Symptomatic left femoral hernia. We'll set up for general surgery evaluation Chronic dermatitis. Evaluation at [facility]

## 2011-01-28 ENCOUNTER — Ambulatory Visit (INDEPENDENT_AMBULATORY_CARE_PROVIDER_SITE_OTHER): Payer: Medicare Other | Admitting: Surgery

## 2011-01-28 ENCOUNTER — Encounter (INDEPENDENT_AMBULATORY_CARE_PROVIDER_SITE_OTHER): Payer: Self-pay | Admitting: Surgery

## 2011-01-28 DIAGNOSIS — K4090 Unilateral inguinal hernia, without obstruction or gangrene, not specified as recurrent: Secondary | ICD-10-CM

## 2011-01-28 NOTE — Progress Notes (Signed)
Chief Complaint  Patient presents with  . New Evaluation    hernia - patient referred by Dr. Bluford Kaufmann and Dr. Vicie Mutters    HISTORY: Patient is a 77 year old white male referred by his primary physician with newly diagnosed inguinal hernia. Patient has been symptomatic for over a month. He has noted slight increase in size of the hernia. It is causing discomfort with physical activity. Symptoms are better in the morning but progressive throughout the day. He denies any signs or symptoms of intestinal obstruction.  Patient has had a previous right inguinal hernia repair as a child. There is been no sign of recurrence. He has had no other abdominal surgery.  Patient does note nocturnal urinary frequency. This has become more significant as the hernia has become larger.   Past Medical History  Diagnosis Date  . BENIGN PROSTATIC HYPERTROPHY 01/06/2007  . COLONIC POLYPS, HX OF 01/06/2007  . NECK PAIN 02/15/2007  . OSTEOARTHRITIS 01/31/2009  . Other symptoms involving cardiovascular system 09/20/2009  . Cough   . Nasal congestion   . Rash october 2012    covering majority of pts body      Current Outpatient Prescriptions  Medication Sig Dispense Refill  . aspirin 81 MG tablet Take 81 mg by mouth daily.        . fexofenadine (ALLEGRA) 180 MG tablet Take 180 mg by mouth daily.        . GuaiFENesin (MUCINEX PO) Take 1,200 mg by mouth as needed.        . hydrocortisone valerate (WEST-CORT) 0.2 % ointment Apply topically as needed.        . naphazoline (CLEAR EYES) 0.012 % ophthalmic solution 1 drop 4 (four) times daily.        . Omega-3 Fatty Acids (FISH OIL) 1000 MG CAPS Take by mouth daily.        . Pseudoephedrine-DM-GG (SUDAFED COUGH PO) Take by mouth as needed.           Allergies  Allergen Reactions  . Amoxicillin   . Ciprofloxacin     REACTION: unspecified  . Doxycycline Hyclate     REACTION: unspecified  . Levofloxacin     REACTION: unspecified  . Sulfonamide  Derivatives      History reviewed. No pertinent family history.   History   Social History  . Marital Status: Married    Spouse Name: N/A    Number of Children: N/A  . Years of Education: N/A   Social History Main Topics  . Smoking status: Former Research scientist (life sciences)  . Smokeless tobacco: Never Used  . Alcohol Use: Yes  . Drug Use: No  . Sexually Active: None   Other Topics Concern  . None   Social History Narrative  . None     REVIEW OF SYSTEMS - PERTINENT POSITIVES ONLY: Positive for pain left groin. Positive for urinary frequency.   EXAM: Filed Vitals:   01/28/11 1129  BP: 128/80  Pulse: 60  Temp: 97.3 F (36.3 C)  Resp: 20    HEENT: normocephalic; pupils equal and reactive; sclerae clear; dentition good; mucous membranes moist NECK:  No palpable nodules; symmetric on extension; no palpable anterior or posterior cervical lymphadenopathy; no supraclavicular masses; no tenderness CHEST: clear to auscultation bilaterally without rales, rhonchi, or wheezes CARDIAC: regular rate and rhythm without significant murmur; peripheral pulses are full GU:  No sign of umbilical hernia. Well-healed surgical wound right groin. Palpation in the right inguinal canal with cough and Valsalva and  shows no sign of recurrence. Left groin shows an obvious bulge. Palpation in the left inguinal canal with cough and Valsalva shows a direct inguinal hernia. It is reducible. It is mildly tender. Penis and testicles are normal without mass or lesion. EXT:  non-tender without edema; no deformity NEURO: no gross focal deficits; no sign of tremor   LABORATORY RESULTS: See E-Chart for most recent results   RADIOLOGY RESULTS: See E-Chart or I-Site for most recent results   IMPRESSION: #1 left inguinal hernia, likely direct, reducible, moderately symptomatic #2 history of right inguinal hernia repair as a child   PLAN: The patient and I had a lengthy discussion regarding inguinal hernia repair in  the use of prosthetic mesh. I provided him with written literature to review. I think he is a good candidate for open left inguinal hernia repair with mesh. This can be done as an outpatient procedure. We will make arrangements for surgery in the near future.  The patient I discussed potential complications including recurrence and infection. He understands and wishes to proceed.  The risks and benefits of the procedure have been discussed at length with the patient.  The patient understands the proposed procedure, potential alternative treatments, and the course of recovery to be expected.  All of the patient's questions have been answered at this time.  The patient wishes to proceed with surgery and will schedule a date for their procedure through our office staff.   Earnstine Regal, MD, [facility]    Visit Diagnoses: 1. Inguinal hernia unilateral, non-recurrent, left     Primary Care Physician: Nyoka Cowden, MD

## 2011-02-05 ENCOUNTER — Ambulatory Visit (INDEPENDENT_AMBULATORY_CARE_PROVIDER_SITE_OTHER)
Admission: RE | Admit: 2011-02-05 | Discharge: 2011-02-05 | Disposition: A | Discharge status: Home / Self Care | Payer: Medicare Other | Source: Ambulatory Visit | Attending: Family Medicine | Admitting: Family Medicine

## 2011-02-05 ENCOUNTER — Encounter: Payer: Self-pay | Admitting: Family Medicine

## 2011-02-05 ENCOUNTER — Ambulatory Visit (INDEPENDENT_AMBULATORY_CARE_PROVIDER_SITE_OTHER): Payer: Medicare Other | Admitting: Family Medicine

## 2011-02-05 DIAGNOSIS — R05 Cough: Secondary | ICD-10-CM

## 2011-02-05 DIAGNOSIS — R0989 Other specified symptoms and signs involving the circulatory and respiratory systems: Secondary | ICD-10-CM

## 2011-02-05 DIAGNOSIS — R059 Cough, unspecified: Secondary | ICD-10-CM

## 2011-02-05 MED ORDER — AZITHROMYCIN 250 MG PO TABS: ORAL_TABLET | ORAL | Status: DC

## 2011-02-05 NOTE — Patient Instructions (Signed)
Leave off pseudoephedrine for now Lots of fluids Follow up promptly for any vomiting or worsening respiratory symptoms.

## 2011-02-05 NOTE — Progress Notes (Signed)
  Subjective:    Patient ID: Benjamin Cardenas, male    DOB: [birth year], 77 y.o.   MRN: [id number]  HPI  Patient seen with 2 week history of cough. Productive of yellow sputum. Not aware of any fever or chills. No dyspnea. No pleuritic pain. Ex-smoker. No reported appetite or weight changes. Has taken Mucinex DM and over-the-counter pseudoephedrine without mitral. Denies hemoptysis. Patient has not had any recent GERD symptoms, postnasal drip, wheezing. Does have some BPH history and somewhat slow urinary stream since starting Sudafed. Multiple drug allergies are reviewed.  Past Medical History  Diagnosis Date  . BENIGN PROSTATIC HYPERTROPHY 01/06/2007  . COLONIC POLYPS, HX OF 01/06/2007  . NECK PAIN 02/15/2007  . OSTEOARTHRITIS 01/31/2009  . Other symptoms involving cardiovascular system 09/20/2009  . Cough   . Nasal congestion   . Rash october 2012    covering majority of pts body    Past Surgical History  Procedure Date  . Hernia repair     reports that he has quit smoking. He has never used smokeless tobacco. He reports that he drinks alcohol. He reports that he does not use illicit drugs. family history is not on file. Allergies  Allergen Reactions  . Amoxicillin   . Ciprofloxacin     REACTION: unspecified  . Doxycycline Hyclate     REACTION: unspecified  . Levofloxacin     REACTION: unspecified  . Sulfonamide Derivatives       Review of Systems  Constitutional: Positive for fatigue. Negative for fever, chills, appetite change and unexpected weight change.  HENT: Negative for sore throat and postnasal drip.   Respiratory: Positive for cough. Negative for wheezing.   Cardiovascular: Negative for chest pain.  Genitourinary: Negative for dysuria.  Neurological: Negative for syncope.  Hematological: Negative for adenopathy.       Objective:   Physical Exam  Constitutional: He is oriented to person, place, and time. He appears well-developed and well-nourished.  HENT:    Right Ear: External ear normal.  Left Ear: External ear normal.  Mouth/Throat: Oropharynx is clear and moist.  Neck: Neck supple.  Cardiovascular: Normal rate and regular rhythm.   Pulmonary/Chest: He has rales.       Patient has rales left base. Right base is clear Normal respiratory rate. No retractions.  Musculoskeletal: He exhibits no edema.  Lymphadenopathy:    He has no cervical adenopathy.  Neurological: He is alert and oriented to person, place, and time.          Assessment & Plan:  Cough with asymmetric breath sounds as above. Chest x-ray to rule out left lower lobe pneumonia. Pulse oximetry 97%. No signs of respiratory distress. Start Zithromax for 5 days. He has multiple other drug allergies which are reviewed. No clear history of allergy to Zithromax. If x-ray confirms pneumonia followup with primary next week to reassess

## 2011-02-06 ENCOUNTER — Telehealth: Payer: Self-pay | Admitting: *Deleted

## 2011-02-06 NOTE — Telephone Encounter (Signed)
Pt called and said that he has enough abx to last until 02/09/11 and has sch a fup ov with Dr Burnice Logan and his earliest appt on 02/17/11. Pt is wondering if he would need to have fup with Dr Elease Hashimoto on 11/6, since Dr Burnice Logan will still be out of office.

## 2011-02-06 NOTE — Telephone Encounter (Signed)
Follow up on the 13 th should be fine as long as no fever and doing well symptomatically. 5 days of Zithromax should be adequate as this works for 10 days.

## 2011-02-06 NOTE — Telephone Encounter (Signed)
Pt is scheduled for hernia surg 11/26 - please schedule in one of the same day slots with dr. Burnice Logan

## 2011-02-06 NOTE — Telephone Encounter (Signed)
[facility] Triage Call Report Triage Record Num: [id number] Operator: Vaughan Sine Patient Name: Benjamin Cardenas Call Date & Time: 02/05/2011 7:27:21PM Patient Phone: [phone] PCP: Marletta Lor Patient Gender: Male PCP Fax : [phone] Patient DOB: July 02, 1929 Practice Name: [facility] Reason for Call: Pamala Hurry, Spouse, calling regarding Other. PCP is Bluford Kaufmann. Callback number is [phone]. Spouse wanting to know how long Pt would be contagous after being DX w/ Pneumonia on 02-05-11, ABX started. Per Health Education, advised Spouse Pt should allow 48 hrs for ABX to enter system and as long as Pt was Afebrile, Pneumonia is spread via dropplet procautions, advised Pt to wash hands and rest. Spouse verbalized understanding. Protocol(s) Used: Information Only Call; No Symptom Triage (Adult) Recommended Outcome per Protocol: Provide Information or Advice Only Reason for Outcome: Health information question; no triage required. Information provided from approved references or clinical experience. Care Advice: ~ 02/05/2011 7:36:22PM Page 1 of 1 [email or web address]

## 2011-02-06 NOTE — Telephone Encounter (Signed)
DR. Elease Hashimoto AWARE I HAVE ASKED FOR APPT NEXT WEEK WITH DR. Burnice Logan

## 2011-02-09 ENCOUNTER — Telehealth: Payer: Self-pay | Admitting: *Deleted

## 2011-02-09 NOTE — Telephone Encounter (Signed)
Benjamin Cardenas, please call pt re: pre op exam this week.  He is still not feeling well.

## 2011-02-09 NOTE — Telephone Encounter (Signed)
Spoke with pt - concerned about cough - no fever , still with drainage , appt made for Friday

## 2011-02-09 NOTE — Telephone Encounter (Signed)
Pt called and said that he does not have a fever, but he does have the same symptoms as before and has yellow sputum. Pt says that is uncomfortable to take deep breaths through mouth, it makes him cough. Should he still wait until 11/13 to see Dr Burnice Logan?

## 2011-02-10 NOTE — Telephone Encounter (Signed)
I spoke with pt earlier today, OV scheduled with Dr Raliegh Ip on Friday, 11/9

## 2011-02-13 ENCOUNTER — Ambulatory Visit (INDEPENDENT_AMBULATORY_CARE_PROVIDER_SITE_OTHER): Payer: Medicare Other | Admitting: Internal Medicine

## 2011-02-13 ENCOUNTER — Encounter: Payer: Self-pay | Admitting: Internal Medicine

## 2011-02-13 DIAGNOSIS — J189 Pneumonia, unspecified organism: Secondary | ICD-10-CM

## 2011-02-13 DIAGNOSIS — K4090 Unilateral inguinal hernia, without obstruction or gangrene, not specified as recurrent: Secondary | ICD-10-CM

## 2011-02-13 NOTE — Progress Notes (Signed)
  Subjective:    Patient ID: Benjamin Cardenas, male    DOB: Jun 07, 1929, 77 y.o.   MRN: [id number]  HPI 77 year old patient who is seen today for followup of a left lower lobe community-acquired pneumonia. He has a 20-pack-year smoking history but discontinued in 1980. He has minimal residual cough this seems to be improving and feels quite well. He is scheduled for elective hernia surgery later this month. There's been no fever chest x-ray revealed a left lower lobe infiltrate. He did antibiotic therapy with azithromycin  Review of Systems  Constitutional: Positive for fatigue and unexpected weight change. Negative for fever, chills and appetite change.  HENT: Negative for hearing loss, ear pain, congestion, sore throat, trouble swallowing, neck stiffness, dental problem, voice change and tinnitus.   Eyes: Negative for pain, discharge and visual disturbance.  Respiratory: Positive for cough. Negative for chest tightness, wheezing and stridor.   Cardiovascular: Negative for chest pain, palpitations and leg swelling.  Gastrointestinal: Negative for nausea, vomiting, abdominal pain, diarrhea, constipation, blood in stool and abdominal distention.  Genitourinary: Negative for urgency, hematuria, flank pain, discharge, difficulty urinating and genital sores.  Musculoskeletal: Negative for myalgias, back pain, joint swelling, arthralgias and gait problem.  Skin: Negative for rash.  Neurological: Negative for dizziness, syncope, speech difficulty, weakness, numbness and headaches.  Hematological: Negative for adenopathy. Does not bruise/bleed easily.  Psychiatric/Behavioral: Negative for behavioral problems and dysphoric mood. The patient is not nervous/anxious.        Objective:   Physical Exam  Constitutional: He is oriented to person, place, and time. He appears well-developed and well-nourished. No distress.  HENT:  Head: Normocephalic.  Right Ear: External ear normal.  Left Ear: External ear  normal.  Eyes: Conjunctivae and EOM are normal.  Neck: Normal range of motion.  Cardiovascular: Normal rate and normal heart sounds.   Pulmonary/Chest: Effort normal and breath sounds normal.       Persistent rales left lower lung  Abdominal: Bowel sounds are normal.  Musculoskeletal: Normal range of motion. He exhibits no edema and no tenderness.  Neurological: He is alert and oriented to person, place, and time.  Psychiatric: He has a normal mood and affect. His behavior is normal.          Assessment & Plan:   Resolving left lower lobes required pneumonia History of former tobacco use History weight loss Symptomatic inguinal hernia  Patient has been asked to return in 6 weeks for followup a followup chest x-ray will be obtained at that time to confirm resolution

## 2011-02-13 NOTE — Patient Instructions (Signed)
Return in 6 weeks for followup and followup chest x-ray

## 2011-02-16 ENCOUNTER — Encounter (HOSPITAL_COMMUNITY): Payer: Self-pay

## 2011-02-16 ENCOUNTER — Telehealth: Payer: Self-pay | Admitting: Internal Medicine

## 2011-02-16 NOTE — Telephone Encounter (Signed)
Attempt to call- [facility] - lmtcb if questions - no futher request needed.Benjamin Cardenas

## 2011-02-16 NOTE — Telephone Encounter (Signed)
No further pre op studies needed

## 2011-02-16 NOTE — Telephone Encounter (Signed)
Pt called and said that he is sch for hernia op on 03/02/11. Pt is having pre-op on 11/13 and is also suppose to have an xray done prior to surgery, so he may be getting xray done during pre-op. Pt is sch to see Dr Raliegh Ip on 12/21 and needs to know if Dr Raliegh Ip is wanting any additional xray done?

## 2011-02-17 ENCOUNTER — Other Ambulatory Visit: Payer: Self-pay

## 2011-02-17 ENCOUNTER — Encounter (HOSPITAL_COMMUNITY): Payer: Medicare Other

## 2011-02-17 ENCOUNTER — Ambulatory Visit (INDEPENDENT_AMBULATORY_CARE_PROVIDER_SITE_OTHER): Payer: Medicare Other | Admitting: Surgery

## 2011-02-17 ENCOUNTER — Encounter (HOSPITAL_COMMUNITY): Payer: Self-pay

## 2011-02-17 ENCOUNTER — Ambulatory Visit (HOSPITAL_COMMUNITY)
Admission: RE | Admit: 2011-02-17 | Discharge: 2011-02-17 | Disposition: A | Discharge status: Home / Self Care | Payer: Medicare Other | Source: Ambulatory Visit | Attending: Surgery | Admitting: Surgery

## 2011-02-17 ENCOUNTER — Ambulatory Visit: Payer: Medicare Other | Admitting: Internal Medicine

## 2011-02-17 DIAGNOSIS — Z8701 Personal history of pneumonia (recurrent): Secondary | ICD-10-CM | POA: Insufficient documentation

## 2011-02-17 DIAGNOSIS — Z01812 Encounter for preprocedural laboratory examination: Secondary | ICD-10-CM | POA: Insufficient documentation

## 2011-02-17 DIAGNOSIS — Z01818 Encounter for other preprocedural examination: Secondary | ICD-10-CM | POA: Insufficient documentation

## 2011-02-17 DIAGNOSIS — Z01810 Encounter for preprocedural cardiovascular examination: Secondary | ICD-10-CM | POA: Insufficient documentation

## 2011-02-17 LAB — PROTIME-INR
INR: 0.92 (ref 0.00–1.49)
Prothrombin Time: 12.6 seconds (ref 11.6–15.2)

## 2011-02-17 LAB — SURGICAL PCR SCREEN: Staphylococcus aureus: POSITIVE — AB

## 2011-02-17 LAB — DIFFERENTIAL
Eosinophils Absolute: 0.1 10*3/uL (ref 0.0–0.7)
Lymphs Abs: 1.3 10*3/uL (ref 0.7–4.0)
Monocytes Relative: 7 % (ref 3–12)
Neutro Abs: 4.3 10*3/uL (ref 1.7–7.7)
Neutrophils Relative %: 70 % (ref 43–77)

## 2011-02-17 LAB — BASIC METABOLIC PANEL
Calcium: 10.4 mg/dL (ref 8.4–10.5)
GFR calc Af Amer: >90 mL/min (ref >90)
GFR calc non Af Amer: 87 mL/min — ABNORMAL LOW (ref >90)
Potassium: 5.4 mEq/L — ABNORMAL HIGH (ref 3.5–5.1)
Sodium: 139 mEq/L (ref 135–145)

## 2011-02-17 LAB — URINALYSIS, ROUTINE W REFLEX MICROSCOPIC
Bilirubin Urine: NEGATIVE
Ketones, ur: NEGATIVE mg/dL
Leukocytes, UA: NEGATIVE
Nitrite: NEGATIVE
Specific Gravity, Urine: 1.005 (ref 1.005–1.030)
Urobilinogen, UA: 0.2 mg/dL (ref 0.0–1.0)
pH: 6.5 (ref 5.0–8.0)

## 2011-02-17 LAB — CBC
Hemoglobin: 14.4 g/dL (ref 13.0–17.0)
MCH: 30.8 pg (ref 26.0–34.0)
Platelets: 247 10*3/uL (ref 150–400)
RBC: 4.68 MIL/uL (ref 4.22–5.81)
WBC: 6.2 10*3/uL (ref 4.0–10.5)

## 2011-02-17 NOTE — Progress Notes (Signed)
Faxed to [facility]

## 2011-02-17 NOTE — Progress Notes (Signed)
Quick Note:  These results are acceptable for scheduled surgery. TMG ______

## 2011-02-17 NOTE — Patient Instructions (Signed)
[place]  02/17/2011   Your procedure is scheduled on:  Mon. 03/02/2011  Report to [place] at 1110 AM.  Call this number if you have problems the morning of surgery: [phone]   Remember:   Do not eat food:after midnight  Do not drink clear liquids: 6 Hours before arrival.  Take these medicines the morning of surgery with A SIP OF WATER: allegra, may use clear eyes opthalmic sol.   Do not wear jewelry, make-up or nail polish.  Do not wear lotions, powders, or perfumes.   Do not shave 48 hours prior to surgery.  Do not bring valuables to the hospital.  Contacts, dentures or bridgework may not be worn into surgery.  Leave suitcase in the car. After surgery it may be brought to your room.  For patients admitted to the hospital, checkout time is 11:00 AM the day of discharge.   Patients discharged the day of surgery will not be allowed to drive home.  Name and phone number of your driver: [phone]  Special Instructions: CHG Shower Use Special Wash: 1/2 bottle night before surgery and 1/2 bottle morning of surgery.   Please read over the following fact sheets that you were given: MRSA Information

## 2011-02-18 ENCOUNTER — Telehealth (INDEPENDENT_AMBULATORY_CARE_PROVIDER_SITE_OTHER): Payer: Self-pay

## 2011-02-18 ENCOUNTER — Telehealth: Payer: Self-pay | Admitting: *Deleted

## 2011-02-18 NOTE — Telephone Encounter (Signed)
Per Dr. Shawna Orleans, he can visit with his famiily and friends.

## 2011-02-18 NOTE — Telephone Encounter (Signed)
Wife called with concern about chest xray, husband still has a bad cough, and positive MRSA swab. I told her that her husband  will be treated today for MRSA, xray of lungs where clear. All other concern should be referred to his primary care provider at this point. He also be around a patient receiving chemo today.  They are advised to check with the patients healthcare providers about exposure.

## 2011-02-18 NOTE — Telephone Encounter (Signed)
Wife called to ask Dr. Amparo Bristol what to do about his surgery coming up, and whether he should be around pt with Chemo.  Already had his chest xray that was negative.  His MRSA was positive, and they need to know whether they should go out with this couple since wife states her friend is being treated for ovarian Cancer.  His surgery will be hernia in the Spring.

## 2011-02-19 NOTE — Telephone Encounter (Signed)
Ok to visit if not ill

## 2011-02-24 ENCOUNTER — Telehealth (INDEPENDENT_AMBULATORY_CARE_PROVIDER_SITE_OTHER): Payer: Self-pay

## 2011-02-24 NOTE — Telephone Encounter (Signed)
C/o constipation. Taking Doculax is there something more effective for his constipation. Patient advised to try OTC Miralax. He still concerned about the rash he is being treated for by [facility]. He is wondering if the rash could be related to his positive MRSA test?

## 2011-02-25 DIAGNOSIS — L432 Lichenoid drug reaction: Secondary | ICD-10-CM | POA: Insufficient documentation

## 2011-02-25 DIAGNOSIS — L858 Other specified epidermal thickening: Secondary | ICD-10-CM | POA: Insufficient documentation

## 2011-03-01 NOTE — H&P (Signed)
HISTORY AND PHYSICAL EXAM  Diagnoses     Inguinal hernia unilateral, non-recurrent     550.90     .  New Evaluation       hernia - patient referred by Dr. Bluford Kaufmann and Dr. Vicie Mutters    HISTORY: Patient is a 77 year old white male referred by his primary physician with newly diagnosed inguinal hernia. Patient has been symptomatic for over a month. He has noted slight increase in size of the hernia. It is causing discomfort with physical activity. Symptoms are better in the morning but progressive throughout the day. He denies any signs or symptoms of intestinal obstruction.  Patient has had a previous right inguinal hernia repair as a child. There is been no sign of recurrence. He has had no other abdominal surgery.  Patient does note nocturnal urinary frequency. This has become more significant as the hernia has become larger.    Past Medical History   Diagnosis  Date   .  BENIGN PROSTATIC HYPERTROPHY  01/06/2007   .  COLONIC POLYPS, HX OF  01/06/2007   .  NECK PAIN  02/15/2007   .  OSTEOARTHRITIS  01/31/2009   .  Other symptoms involving cardiovascular system  09/20/2009   .  Cough     .  Nasal congestion     .  Rash  october 2012       covering majority of pts body       Current Outpatient Prescriptions   Medication  Sig  Dispense  Refill   .  aspirin 81 MG tablet  Take 81 mg by mouth daily.           .  fexofenadine (ALLEGRA) 180 MG tablet  Take 180 mg by mouth daily.           .  GuaiFENesin (MUCINEX PO)  Take 1,200 mg by mouth as needed.           .  hydrocortisone valerate (WEST-CORT) 0.2 % ointment  Apply topically as needed.           .  naphazoline (CLEAR EYES) 0.012 % ophthalmic solution  1 drop 4 (four) times daily.           .  Omega-3 Fatty Acids (FISH OIL) 1000 MG CAPS  Take by mouth daily.           .  Pseudoephedrine-DM-GG (SUDAFED COUGH PO)  Take by mouth as needed.            Allergies   Allergen  Reactions   .  Amoxicillin     .  Ciprofloxacin        REACTION: unspecified   .  Doxycycline Hyclate         REACTION: unspecified   .  Levofloxacin         REACTION: unspecified   .  Sulfonamide Derivatives      History reviewed. No pertinent family history.  Social History   .  Marital Status:  Married       Spouse Name:  N/A       Number of Children:  N/A   .  Years of Education:  N/A   Social History Main Topics   .  Smoking status:  Former Research scientist (life sciences)   .  Smokeless tobacco:  Never Used   .  Alcohol Use:  Yes   .  Drug Use:  No   .  Sexually Active:  None    REVIEW  OF SYSTEMS - PERTINENT POSITIVES ONLY: Positive for pain left groin. Positive for urinary frequency.  EXAM: Filed Vitals:     01/28/11 1129   BP:  128/80   Pulse:  60   Temp:  97.3 F (36.3 C)   Resp:  20   HEENT:           normocephalic; pupils equal and reactive; sclerae clear; dentition good; mucous membranes moist NECK:             No palpable nodules; symmetric on extension; no palpable anterior or posterior cervical lymphadenopathy; no supraclavicular masses; no tenderness CHEST:           clear to auscultation bilaterally without rales, rhonchi, or wheezes CARDIAC:       regular rate and rhythm without significant murmur; peripheral pulses are full GU:                  No sign of umbilical hernia. Well-healed surgical wound right groin. Palpation in the right inguinal canal with cough and Valsalva and shows no sign of recurrence. Left groin shows an obvious bulge. Palpation in the left inguinal canal with cough and Valsalva shows a direct inguinal hernia. It is reducible. It is mildly tender. Penis and testicles are normal without mass or lesion. EXT:                non-tender without edema; no deformity NEURO:          no gross focal deficits; no sign of tremor  LABORATORY RESULTS: See E-Chart for most recent results  RADIOLOGY RESULTS: See E-Chart or I-Site for most recent results  IMPRESSION: #1 left inguinal hernia, likely direct, reducible,  moderately symptomatic #2 history of right inguinal hernia repair as a child  PLAN: The patient and I had a lengthy discussion regarding inguinal hernia repair in the use of prosthetic mesh. I provided him with written literature to review. I think he is a good candidate for open left inguinal hernia repair with mesh. This can be done as an outpatient procedure. We will make arrangements for surgery in the near future.  The patient I discussed potential complications including recurrence and infection. He understands and wishes to proceed.  The risks and benefits of the procedure have been discussed at length with the patient.  The patient understands the proposed procedure, potential alternative treatments, and the course of recovery to be expected.  All of the patient's questions have been answered at this time.  The patient wishes to proceed with surgery.   Earnstine Regal, MD, [facility]

## 2011-03-02 ENCOUNTER — Encounter (HOSPITAL_COMMUNITY): Payer: Self-pay | Admitting: Anesthesiology

## 2011-03-02 ENCOUNTER — Encounter (HOSPITAL_COMMUNITY): Payer: Self-pay | Admitting: *Deleted

## 2011-03-02 ENCOUNTER — Ambulatory Visit (HOSPITAL_COMMUNITY)
Admission: RE | Admit: 2011-03-02 | Discharge: 2011-03-02 | Disposition: A | Discharge status: Home / Self Care | Payer: Medicare Other | Source: Ambulatory Visit | Attending: Surgery | Admitting: Surgery

## 2011-03-02 ENCOUNTER — Encounter (HOSPITAL_COMMUNITY)
Admission: RE | Disposition: A | Discharge status: Home / Self Care | Payer: Self-pay | Source: Ambulatory Visit | Attending: Surgery

## 2011-03-02 ENCOUNTER — Ambulatory Visit (HOSPITAL_COMMUNITY): Payer: Medicare Other | Admitting: Anesthesiology

## 2011-03-02 DIAGNOSIS — K4090 Unilateral inguinal hernia, without obstruction or gangrene, not specified as recurrent: Secondary | ICD-10-CM | POA: Insufficient documentation

## 2011-03-02 DIAGNOSIS — Z79899 Other long term (current) drug therapy: Secondary | ICD-10-CM | POA: Insufficient documentation

## 2011-03-02 HISTORY — PX: INGUINAL HERNIA REPAIR: SHX194

## 2011-03-02 SURGERY — REPAIR, HERNIA, INGUINAL, ADULT
Anesthesia: General | Site: Pelvis | Laterality: Left | Wound class: Clean

## 2011-03-02 MED ORDER — VANCOMYCIN HCL IN DEXTROSE 1-5 GM/200ML-% IV SOLN
1000.0000 mg | INTRAVENOUS | Status: AC
  Administered 2011-03-02: 1000 mg via INTRAVENOUS

## 2011-03-02 MED ORDER — HYDROCODONE-ACETAMINOPHEN 10-325 MG PO TABS
ORAL_TABLET | ORAL | Status: AC
  Administered 2011-03-02: 1 via ORAL
  Filled 2011-03-02: qty 1

## 2011-03-02 MED ORDER — BUPIVACAINE-EPINEPHRINE (PF) 0.5% -1:200000 IJ SOLN
INTRAMUSCULAR | Status: AC
  Filled 2011-03-02: qty 10

## 2011-03-02 MED ORDER — BUPIVACAINE HCL (PF) 0.5 % IJ SOLN
INTRAMUSCULAR | Status: DC | PRN
  Administered 2011-03-02: 20 mL

## 2011-03-02 MED ORDER — GLYCOPYRROLATE 0.2 MG/ML IJ SOLN
INTRAMUSCULAR | Status: DC | PRN
  Administered 2011-03-02: .4 mg via INTRAVENOUS

## 2011-03-02 MED ORDER — SODIUM CHLORIDE 0.9 % IR SOLN
Status: DC | PRN
  Administered 2011-03-02: 1000 mL

## 2011-03-02 MED ORDER — VANCOMYCIN HCL IN DEXTROSE 1-5 GM/200ML-% IV SOLN
INTRAVENOUS | Status: AC
  Filled 2011-03-02: qty 200

## 2011-03-02 MED ORDER — CISATRACURIUM BESYLATE 2 MG/ML IV SOLN
INTRAVENOUS | Status: DC | PRN
  Administered 2011-03-02: 8 mg via INTRAVENOUS

## 2011-03-02 MED ORDER — LACTATED RINGERS IV SOLN
INTRAVENOUS | Status: DC | PRN
  Administered 2011-03-02: 12:00:00 via INTRAVENOUS
  Administered 2011-03-02: 14:00:00

## 2011-03-02 MED ORDER — FENTANYL CITRATE 0.05 MG/ML IJ SOLN
INTRAMUSCULAR | Status: AC
  Filled 2011-03-02: qty 2

## 2011-03-02 MED ORDER — NEOSTIGMINE METHYLSULFATE 1 MG/ML IJ SOLN
INTRAMUSCULAR | Status: DC | PRN
  Administered 2011-03-02: 3 mg via INTRAVENOUS

## 2011-03-02 MED ORDER — ETOMIDATE 2 MG/ML IV SOLN
INTRAVENOUS | Status: DC | PRN
  Administered 2011-03-02: 13 mg via INTRAVENOUS

## 2011-03-02 MED ORDER — HYDROCODONE-ACETAMINOPHEN 10-325 MG PO TABS
1.0000 | ORAL_TABLET | ORAL | Status: DC | PRN
  Administered 2011-03-02: 1 via ORAL

## 2011-03-02 MED ORDER — METOCLOPRAMIDE HCL 5 MG/ML IJ SOLN: 10.0000 mg | Freq: Once | INTRAMUSCULAR | Status: DC | PRN

## 2011-03-02 MED ORDER — FENTANYL CITRATE 0.05 MG/ML IJ SOLN
25.0000 ug | INTRAMUSCULAR | Status: DC | PRN
  Administered 2011-03-02 (×2): 50 ug via INTRAVENOUS

## 2011-03-02 MED ORDER — HYDROCODONE-ACETAMINOPHEN 10-325 MG PO TABS: 1.0000 | ORAL_TABLET | ORAL | Status: DC | PRN

## 2011-03-02 MED ORDER — ONDANSETRON HCL 4 MG/2ML IJ SOLN
INTRAMUSCULAR | Status: DC | PRN
  Administered 2011-03-02: 4 mg via INTRAVENOUS

## 2011-03-02 MED ORDER — LACTATED RINGERS IV SOLN: INTRAVENOUS | Status: DC

## 2011-03-02 MED ORDER — FENTANYL CITRATE 0.05 MG/ML IJ SOLN
INTRAMUSCULAR | Status: DC | PRN
  Administered 2011-03-02 (×2): 50 ug via INTRAVENOUS

## 2011-03-02 SURGICAL SUPPLY — 39 items
APL SKNCLS STERI-STRIP NONHPOA (GAUZE/BANDAGES/DRESSINGS) ×1
APPLICATOR COTTON TIP 6IN STRL (MISCELLANEOUS) ×2 IMPLANT
BENZOIN TINCTURE PRP APPL 2/3 (GAUZE/BANDAGES/DRESSINGS) ×2 IMPLANT
BLADE HEX COATED 2.75 (ELECTRODE) ×2 IMPLANT
BLADE SURG 15 STRL LF DISP TIS (BLADE) ×1 IMPLANT
BLADE SURG 15 STRL SS (BLADE) ×2
BLADE SURG SZ10 CARB STEEL (BLADE) IMPLANT
CANISTER SUCTION 2500CC (MISCELLANEOUS) ×2 IMPLANT
CLOSURE STERI STRIP 1/2 X4 (GAUZE/BANDAGES/DRESSINGS) ×1 IMPLANT
CLOTH BEACON ORANGE TIMEOUT ST (SAFETY) ×2 IMPLANT
DECANTER SPIKE VIAL GLASS SM (MISCELLANEOUS) ×2 IMPLANT
DRAIN PENROSE 18X1/2 LTX STRL (DRAIN) ×2 IMPLANT
DRAPE LAPAROTOMY TRNSV 102X78 (DRAPE) ×2 IMPLANT
ELECT REM PT RETURN 9FT ADLT (ELECTROSURGICAL) ×2
ELECTRODE REM PT RTRN 9FT ADLT (ELECTROSURGICAL) ×1 IMPLANT
GLOVE BIOGEL PI IND STRL 7.0 (GLOVE) ×1 IMPLANT
GLOVE BIOGEL PI INDICATOR 7.0 (GLOVE) ×1
GLOVE SURG ORTHO 8.0 STRL STRW (GLOVE) ×2 IMPLANT
GOWN STRL NON-REIN LRG LVL3 (GOWN DISPOSABLE) ×2 IMPLANT
GOWN STRL REIN XL XLG (GOWN DISPOSABLE) ×4 IMPLANT
KIT BASIN OR (CUSTOM PROCEDURE TRAY) ×2 IMPLANT
MESH ULTRAPRO 3X6 7.6X15CM (Mesh General) ×1 IMPLANT
NDL HYPO 25X1 1.5 SAFETY (NEEDLE) ×1 IMPLANT
NEEDLE HYPO 25X1 1.5 SAFETY (NEEDLE) ×2 IMPLANT
NS IRRIG 1000ML POUR BTL (IV SOLUTION) ×2 IMPLANT
PACK BASIC VI WITH GOWN DISP (CUSTOM PROCEDURE TRAY) ×2 IMPLANT
PENCIL BUTTON HOLSTER BLD 10FT (ELECTRODE) ×2 IMPLANT
SPONGE GAUZE 4X4 12PLY (GAUZE/BANDAGES/DRESSINGS) ×2 IMPLANT
SPONGE LAP 4X18 X RAY DECT (DISPOSABLE) ×6 IMPLANT
STRIP CLOSURE SKIN 1/2X4 (GAUZE/BANDAGES/DRESSINGS) ×2 IMPLANT
SUT NOVA NAB GS-22 2 0 T19 (SUTURE) ×4 IMPLANT
SUT SILK 2 0 SH (SUTURE) ×2 IMPLANT
SUT VIC AB 3-0 SH 18 (SUTURE) ×2 IMPLANT
SUT VIC AB 4-0 PS2 27 (SUTURE) ×2 IMPLANT
SYR BULB IRRIGATION 50ML (SYRINGE) ×2 IMPLANT
SYR CONTROL 10ML LL (SYRINGE) ×2 IMPLANT
TAPE CLOTH SURG 4X10 WHT LF (GAUZE/BANDAGES/DRESSINGS) ×1 IMPLANT
TOWEL OR 17X26 10 PK STRL BLUE (TOWEL DISPOSABLE) ×2 IMPLANT
YANKAUER SUCT BULB TIP 10FT TU (MISCELLANEOUS) ×2 IMPLANT

## 2011-03-02 NOTE — Progress Notes (Signed)
In and out cath performed with 250cc amber urine returned. Tolerated well. Pt and wife verbalize understanding of d/c instructions. Wife has prescription for pain meds. Vss. Pain controlled well. Informed pt and wife if he is unable to void in 8 hours/or when he becomes uncomfortable to return to the ER per Dr. Lear Ng orders. Verbalize understanding. D/C home with family.

## 2011-03-02 NOTE — Anesthesia Postprocedure Evaluation (Signed)
  Anesthesia Post-op Note  Patient: Benjamin Cardenas  Procedure(s) Performed:  HERNIA REPAIR INGUINAL ADULT - with mesh   Patient Location: PACU  Anesthesia Type: General  Level of Consciousness: awake and alert   Airway and Oxygen Therapy: Patient Spontanous Breathing  Post-op Pain: mild  Post-op Assessment: Post-op Vital signs reviewed, Patient's Cardiovascular Status Stable, Respiratory Function Stable, Patent Airway and No signs of Nausea or vomiting  Post-op Vital Signs: stable  Complications: No apparent anesthesia complications

## 2011-03-02 NOTE — Anesthesia Preprocedure Evaluation (Signed)
Anesthesia Evaluation  Patient identified by MRN, date of birth, ID band Patient awake    Reviewed: Allergy & Precautions, H&P , NPO status , Patient's Chart, lab work & pertinent test results  Airway Mallampati: II TM Distance: >3 FB Neck ROM: Full    Dental No notable dental hx.    Pulmonary neg pulmonary ROS, pneumonia ,  clear to auscultation  Pulmonary exam normal       Cardiovascular neg cardio ROS Regular Normal    Neuro/Psych Negative Neurological ROS  Negative Psych ROS   GI/Hepatic negative GI ROS, Neg liver ROS,   Endo/Other  Negative Endocrine ROS  Renal/GU negative Renal ROS  Genitourinary negative   Musculoskeletal negative musculoskeletal ROS (+)   Abdominal   Peds negative pediatric ROS (+)  Hematology negative hematology ROS (+)   Anesthesia Other Findings   Reproductive/Obstetrics negative OB ROS                           Anesthesia Physical Anesthesia Plan  ASA: II  Anesthesia Plan: General   Post-op Pain Management:    Induction: Intravenous  Airway Management Planned: LMA  Additional Equipment:   Intra-op Plan:   Post-operative Plan: Extubation in OR  Informed Consent: I have reviewed the patients History and Physical, chart, labs and discussed the procedure including the risks, benefits and alternatives for the proposed anesthesia with the patient or authorized representative who has indicated his/her understanding and acceptance.   Dental advisory given  Plan Discussed with: CRNA  Anesthesia Plan Comments:         Anesthesia Quick Evaluation

## 2011-03-02 NOTE — Op Note (Signed)
Inguinal Hernia, Open, Procedure Note  Pre-operative Diagnosis:  Left inguinal hernia  Post-operative Diagnosis: same  Procedure:  Left inguinal hernia repair with mesh  Surgeon:  Earnstine Regal, MD, FACS  Anesthesia:  General  Indications: The patient presented with a left reducible inguinal hernia.    Procedure Details  The patient was seen again in the Holding Room. The risks, benefits, complications, treatment options, and expected outcomes were discussed with the patient.  There was concurrence with the proposed plan, and informed consent was obtained. The site of surgery was properly noted/marked. The patient was taken to the Operating Room, identified by name, and the procedure verified as hernia repair. A Time Out was held and the above information confirmed.  The patient was placed in the supine position and underwent induction of anesthesia.  The lower abdomen and groin was prepped and draped in the usual strict aseptic fashion.  After ascertaining that an adequate level of anesthesia had been obtained, and incision is made in the groin with a #10 blade.  Dissection is carried through the subcutaneous tissues and hemostasis obtained with the electrocautery.  A Gelpi retractor is placed for exposure.  The external oblique fascia is incised in line with it's fibers and extended through the external inguinal ring.  The cord structures are dissected out of the inguinal canal and encircled with a Penrose drain.  The floor of the inguinal canal is dissected out.  The cord is explored and a moderate sized indirect inguinal hernia sac is identified.  The sac is dissected out up to the level of the internal inguinal ring.  The sac is opened and contains appendix epiploica which are reduced back into the peritoneal cavity.  A high ligation of the sac is performed with a 2-0 silk suture.  The floor of the inguinal canal is reconstructed with a sheet of mesh cut to the appropriate dimensions.  It is  secured to the pubic tubercle with a 2-0 Novafil suture and along the inguinal ligament with a running 2-0 Novafil suture.  Mesh is split to accommodate the cord structures.  The superior edge of the mesh is secured to the transversalis and internal oblique muscles with interrupted 2-0 Novafil sutures.  The tails of the mesh are overlapped lateral to the cord structures and secured to the inguinal ligament with interrupted 2-0 Novafil sutures to recreate the internal inguinal ring.  Cord structures are returned to the inguinal canal.  Local anesthetic is infiltrated throughout the field.  External oblique fascia is closed with interrupted 3-0 Vicryl sutures.  Subcutaneous tissues are closed with interrupted 3-0 Vicryl sutures.  Skin is anesthetized with local anesthetic, and the skin edges re-approximated with a running 4-0 Monocryl suture.  Wound is washed and dried and benzoin and steristrips are applied.  A gauze dressing is then applied.  Instrument, sponge, and needle counts were correct prior to closure and at the conclusion of the case.  Earnstine Regal, MD, [facility]   Findings: Hernia as above  Estimated Blood Loss: Minimal         Specimens: None  Complications: None; patient tolerated the procedure well.         Disposition: PACU - hemodynamically stable.         Condition: stable

## 2011-03-02 NOTE — Progress Notes (Signed)
Patient here from PACU at 1400. Has been unable to void. Has ambulated well and had 3 cups of liquid intake and 400 cc ivf. Bladder scan showed 500cc.  MD on call made aware and order received.

## 2011-03-02 NOTE — Transfer of Care (Signed)
Immediate Anesthesia Transfer of Care Note  Patient: Benjamin Cardenas  Procedure(s) Performed:  HERNIA REPAIR INGUINAL ADULT - with mesh   Patient Location: PACU  Anesthesia Type: General  Level of Consciousness: awake, alert  and sedated  Airway & Oxygen Therapy: Patient Spontanous Breathing and Patient connected to face mask oxygen  Post-op Assessment: Report given to PACU RN and Post -op Vital signs reviewed and stable  Post vital signs: Reviewed and stable  Complications: No apparent anesthesia complications

## 2011-03-02 NOTE — Interval H&P Note (Signed)
History and Physical Interval Note:   03/02/2011   12:34 PM   Benjamin Cardenas  has presented today for surgery, with the diagnosis of left inguinal hernia   The various methods of treatment have been discussed with the patient. After consideration of risks, benefits and other options for treatment, the patient has consented to  Procedure(s):  HERNIA REPAIR INGUINAL ADULT as a surgical intervention .  The patients' history has been reviewed, patient examined, no change in status, stable for surgery.  I have reviewed the patients' chart and labs.  Questions were answered to the patient's satisfaction.  Patient was examined in the holding area.  New vitals were recorded.  He is acceptable for surgery.  Earnstine Regal, MD, [facility]    Earnstine Regal  MD

## 2011-03-04 ENCOUNTER — Encounter (HOSPITAL_COMMUNITY): Payer: Self-pay | Admitting: Surgery

## 2011-03-06 ENCOUNTER — Telehealth (INDEPENDENT_AMBULATORY_CARE_PROVIDER_SITE_OTHER): Payer: Self-pay

## 2011-03-06 NOTE — Telephone Encounter (Signed)
Pts wife called asking what pt can take for constipation. Miralax is not helping. Advised MOM adult dose,increase fluids,use glycerin suppository if needed. Pt to call if recommendations do not resolve constipation.

## 2011-03-10 ENCOUNTER — Ambulatory Visit (INDEPENDENT_AMBULATORY_CARE_PROVIDER_SITE_OTHER): Payer: Medicare Other | Admitting: Surgery

## 2011-03-10 ENCOUNTER — Encounter (INDEPENDENT_AMBULATORY_CARE_PROVIDER_SITE_OTHER): Payer: Self-pay | Admitting: Surgery

## 2011-03-10 VITALS — BP 118/72 | HR 72 | Temp 97.6°F | Resp 12 | Ht 67.0 in | Wt 152.2 lb

## 2011-03-10 DIAGNOSIS — K4090 Unilateral inguinal hernia, without obstruction or gangrene, not specified as recurrent: Secondary | ICD-10-CM

## 2011-03-10 NOTE — Progress Notes (Signed)
Visit Diagnoses: 1. Inguinal hernia unilateral, non-recurrent, left     HISTORY: Patient returns for his first postoperative visit having undergone left inguinal hernia repair with mesh. Postoperative course has been uneventful.  EXAM: Surgical wound is well healed. Steri-Strips are removed in the office. Mild soft tissue swelling. No sign of recurrence with cough or Valsalva. No sign of infection.  IMPRESSION: Status post left inguinal hernia repair with mesh  PLAN: Patient will begin applying topical creams to his incision. He is restricted to 25 pounds lifting. He will return in 6 weeks for a final wound check.   Earnstine Regal, MD, [facility]

## 2011-03-10 NOTE — Patient Instructions (Signed)
  COCOA BUTTER & VITAMIN E CREAM  (Palmer's or other brand)  Apply cocoa butter/vitamin E cream to your incision 2 - 3 times daily.  Massage cream into incision for one minute with each application.  Use sunscreen (50 SPF or higher) for first 6 months after surgery.  You may substitute Mederma or other scar reducing creams as desired.   

## 2011-03-27 ENCOUNTER — Ambulatory Visit: Payer: Medicare Other | Admitting: Internal Medicine

## 2011-04-22 ENCOUNTER — Ambulatory Visit (INDEPENDENT_AMBULATORY_CARE_PROVIDER_SITE_OTHER): Payer: Medicare Other | Admitting: Surgery

## 2011-04-22 ENCOUNTER — Encounter (INDEPENDENT_AMBULATORY_CARE_PROVIDER_SITE_OTHER): Payer: Self-pay | Admitting: Surgery

## 2011-04-22 VITALS — BP 108/72 | HR 100 | Temp 97.0°F | Resp 16 | Ht 67.0 in | Wt 155.6 lb

## 2011-04-22 DIAGNOSIS — K4090 Unilateral inguinal hernia, without obstruction or gangrene, not specified as recurrent: Secondary | ICD-10-CM

## 2011-04-22 NOTE — Patient Instructions (Signed)
  COCOA BUTTER & VITAMIN E CREAM  (Palmer's or other brand)  Apply cocoa butter/vitamin E cream to your incision 2 - 3 times daily.  Massage cream into incision for one minute with each application.  Use sunscreen (50 SPF or higher) for first 6 months after surgery if area is exposed to sun.  You may substitute Mederma or other scar reducing creams as desired.   

## 2011-04-22 NOTE — Progress Notes (Signed)
Visit Diagnoses: 1. Inguinal hernia unilateral, non-recurrent, left     HISTORY: The patient is a 78 year old white male who underwent left inguinal hernia repair with mesh. No postoperative complications.  EXAM: Surgical wound is well-healed. No soft tissue swelling. With Valsalva and cough in a standing position there is no sign of recurrent hernia.  IMPRESSION: Status post left inguinal hernia repair with mesh  PLAN: Patient may resume all normal activity without restriction. I've asked him to go slowly at the gym as he increases his exercise regimen over the next month. He will apply topical creams to his incision. He will return to see me as needed.  Earnstine Regal, MD, [facility]

## 2011-05-14 ENCOUNTER — Ambulatory Visit (INDEPENDENT_AMBULATORY_CARE_PROVIDER_SITE_OTHER): Payer: Medicare Other | Admitting: Internal Medicine

## 2011-05-14 ENCOUNTER — Encounter: Payer: Self-pay | Admitting: Internal Medicine

## 2011-05-14 DIAGNOSIS — J069 Acute upper respiratory infection, unspecified: Secondary | ICD-10-CM | POA: Diagnosis not present

## 2011-05-14 DIAGNOSIS — M1990 Unspecified osteoarthritis, unspecified site: Secondary | ICD-10-CM

## 2011-05-14 NOTE — Progress Notes (Signed)
  Subjective:    Patient ID: Benjamin Cardenas, male    DOB: [birth year], 78 y.o.   MRN: [id number]  HPI  78 year old patient who is seen today for followup. He has developed a mild viral URI with some nasal congestion. Preoperatively he was treated for MRSA colonization and was concerned about the need to retreat in view of his mild nasal symptoms. He has had no fever he does have a history of allergic rhinitis. He did quite well with his left inguinal hernia surgery. He has osteoarthritis which has been stable.    Review of Systems  HENT: Positive for congestion and rhinorrhea.   Respiratory: Positive for cough.        Objective:   Physical Exam  Constitutional: He is oriented to person, place, and time. He appears well-developed.  HENT:  Head: Normocephalic.  Right Ear: External ear normal.  Left Ear: External ear normal.  Eyes: Conjunctivae and EOM are normal.  Neck: Normal range of motion.  Cardiovascular: Normal rate and normal heart sounds.   Pulmonary/Chest: Breath sounds normal.  Abdominal: Bowel sounds are normal.  Musculoskeletal: Normal range of motion. He exhibits no edema and no tenderness.  Neurological: He is alert and oriented to person, place, and time.  Psychiatric: He has a normal mood and affect. His behavior is normal.          Assessment & Plan:    Viral URI. Will treat symptomatically History of MRSA colonization. Osteoarthritis  We'll see in 6 months for his annual exam

## 2011-05-14 NOTE — Patient Instructions (Signed)
Get plenty of rest, Drink lots of  clear liquids, and use Tylenol or ibuprofen for fever and discomfort.      It is important that you exercise regularly, at least 20 minutes 3 to 4 times per week.  If you develop chest pain or shortness of breath seek  medical attention.  Return in 6 months for follow-up

## 2011-06-16 ENCOUNTER — Encounter: Payer: Self-pay | Admitting: Internal Medicine

## 2011-06-16 ENCOUNTER — Ambulatory Visit (INDEPENDENT_AMBULATORY_CARE_PROVIDER_SITE_OTHER): Payer: Medicare Other | Admitting: Internal Medicine

## 2011-06-16 DIAGNOSIS — J069 Acute upper respiratory infection, unspecified: Secondary | ICD-10-CM | POA: Diagnosis not present

## 2011-06-16 DIAGNOSIS — M1990 Unspecified osteoarthritis, unspecified site: Secondary | ICD-10-CM | POA: Diagnosis not present

## 2011-06-16 NOTE — Progress Notes (Signed)
  Subjective:    Patient ID: Benjamin Cardenas, male    DOB: Sep 01, 1929, 78 y.o.   MRN: [id number]  HPI 78 year old patient who has a history of osteoarthritis;  he was seen for URI approximately 5 weeks ago and then relapsed approximately 2 weeks ago. His chief complaint is cough. Cough is minimally reproductive of dark sputum. He has had no documented fever. Denies any chest pain chills or shortness of breath. No wheezing.    Review of Systems  Constitutional: Negative for fever, chills, appetite change and fatigue.  HENT: Positive for congestion and voice change. Negative for hearing loss, ear pain, sore throat, trouble swallowing, neck stiffness, dental problem and tinnitus.   Eyes: Negative for pain, discharge and visual disturbance.  Respiratory: Positive for cough. Negative for chest tightness, wheezing and stridor.   Cardiovascular: Negative for chest pain, palpitations and leg swelling.  Gastrointestinal: Negative for nausea, vomiting, abdominal pain, diarrhea, constipation, blood in stool and abdominal distention.  Genitourinary: Negative for urgency, hematuria, flank pain, discharge, difficulty urinating and genital sores.  Musculoskeletal: Negative for myalgias, back pain, joint swelling, arthralgias and gait problem.  Skin: Negative for rash.  Neurological: Negative for dizziness, syncope, speech difficulty, weakness, numbness and headaches.  Hematological: Negative for adenopathy. Does not bruise/bleed easily.  Psychiatric/Behavioral: Negative for behavioral problems and dysphoric mood. The patient is not nervous/anxious.        Objective:   Physical Exam  Constitutional: He is oriented to person, place, and time. He appears well-developed and well-nourished. No distress.  HENT:  Head: Normocephalic.  Right Ear: External ear normal.  Left Ear: External ear normal.  Eyes: Conjunctivae and EOM are normal.  Neck: Normal range of motion.  Cardiovascular: Normal rate and normal  heart sounds.   Pulmonary/Chest: Effort normal and breath sounds normal. No respiratory distress. He has no wheezes. He has no rales.  Abdominal: Bowel sounds are normal.  Musculoskeletal: Normal range of motion. He exhibits no edema and no tenderness.  Neurological: He is alert and oriented to person, place, and time.  Psychiatric: He has a normal mood and affect. His behavior is normal.          Assessment & Plan:   Viral URI. Will treat symptomatically Osteoarthritis stable

## 2011-06-16 NOTE — Patient Instructions (Signed)
Get plenty of rest, Drink lots of  clear liquids, and use Tylenol or ibuprofen for fever and discomfort.     Use saline irrigation, warm  moist compresses and over-the-counter decongestants only as directed.  Call if there is no improvement in 5 to 7 days, or sooner if you develop increasing pain, fever, or any new symptoms.  musinex dm  One twice daily

## 2011-06-25 ENCOUNTER — Encounter: Payer: Self-pay | Admitting: Internal Medicine

## 2011-06-25 ENCOUNTER — Ambulatory Visit (INDEPENDENT_AMBULATORY_CARE_PROVIDER_SITE_OTHER): Payer: Medicare Other | Admitting: Internal Medicine

## 2011-06-25 VITALS — BP 110/70 | Temp 98.4°F | Wt 154.0 lb

## 2011-06-25 DIAGNOSIS — J069 Acute upper respiratory infection, unspecified: Secondary | ICD-10-CM

## 2011-06-25 MED ORDER — AZITHROMYCIN 250 MG PO TABS: ORAL_TABLET | ORAL | Status: AC

## 2011-06-25 NOTE — Progress Notes (Signed)
  Subjective:    Patient ID: Benjamin Cardenas, male    DOB: Aug 10, 1929, 78 y.o.   MRN: [id number]  HPI 78 year old patient who was treated earlier for a URI. Last night he developed worsening cough which now is described as a dark orange. He states that he had fever as high as 102 twice during the night. His chief complaint today is persistent cough and malaise. Denies any chills denies any chest pain shortness or breath or wheezing    Review of Systems  Constitutional: Positive for fever and fatigue. Negative for chills and appetite change.  HENT: Negative for hearing loss, ear pain, congestion, sore throat, trouble swallowing, neck stiffness, dental problem, voice change and tinnitus.   Eyes: Negative for pain, discharge and visual disturbance.  Respiratory: Positive for cough. Negative for chest tightness, wheezing and stridor.   Cardiovascular: Negative for chest pain, palpitations and leg swelling.  Gastrointestinal: Negative for nausea, vomiting, abdominal pain, diarrhea, constipation, blood in stool and abdominal distention.  Genitourinary: Negative for urgency, hematuria, flank pain, discharge, difficulty urinating and genital sores.  Musculoskeletal: Negative for myalgias, back pain, joint swelling, arthralgias and gait problem.  Skin: Negative for rash.  Neurological: Negative for dizziness, syncope, speech difficulty, weakness, numbness and headaches.  Hematological: Negative for adenopathy. Does not bruise/bleed easily.  Psychiatric/Behavioral: Negative for behavioral problems and dysphoric mood. The patient is not nervous/anxious.        Objective:   Physical Exam  Constitutional: He is oriented to person, place, and time. He appears well-developed.  HENT:  Head: Normocephalic.  Right Ear: External ear normal.  Left Ear: External ear normal.  Eyes: Conjunctivae and EOM are normal.  Neck: Normal range of motion.  Cardiovascular: Normal rate and normal heart sounds.     Pulmonary/Chest: Breath sounds normal. No respiratory distress. He has no wheezes.       O2 saturation 98% Pulse rate 78  Abdominal: Bowel sounds are normal.  Musculoskeletal: Normal range of motion. He exhibits no edema and no tenderness.  Neurological: He is alert and oriented to person, place, and time.  Psychiatric: He has a normal mood and affect. His behavior is normal.          Assessment & Plan:   URI with bronchitis. Patient has multiple antibiotic allergies but has done well with azithromycin. We'll treat

## 2011-06-25 NOTE — Patient Instructions (Signed)
Get plenty of rest, Drink lots of  clear liquids, and use Tylenol or ibuprofen for fever and discomfort.    Mucinex  DM twice daily  Call or return to clinic prn if these symptoms worsen or fail to improve as anticipated.  Take your antibiotic as prescribed until ALL of it is gone, but stop if you develop a rash, swelling, or any side effects of the medication.  Contact our office as soon as possible if  there are side effects of the medication.

## 2011-07-10 ENCOUNTER — Encounter: Payer: Self-pay | Admitting: Internal Medicine

## 2011-07-10 ENCOUNTER — Ambulatory Visit (INDEPENDENT_AMBULATORY_CARE_PROVIDER_SITE_OTHER): Payer: Medicare Other | Admitting: Internal Medicine

## 2011-07-10 VITALS — BP 140/82 | Temp 98.5°F | Wt 154.0 lb

## 2011-07-10 DIAGNOSIS — R071 Chest pain on breathing: Secondary | ICD-10-CM

## 2011-07-10 DIAGNOSIS — J069 Acute upper respiratory infection, unspecified: Secondary | ICD-10-CM

## 2011-07-10 NOTE — Patient Instructions (Signed)
Call or return to clinic prn if these symptoms worsen or fail to improve as anticipated.

## 2011-07-10 NOTE — Progress Notes (Signed)
  Subjective:    Patient ID: Benjamin Cardenas, male    DOB: 1929-06-13, 78 y.o.   MRN: [id number]  HPI  78 year old patient who was seen earlier for a URI. He continues to have the nonproductive cough and has developed some mild bilateral chest wall pain. His chief complaint is persistent cough. Denies any sputum production  Fever chillswheezing or shortness of breath. He continues using Mucinex    Review of Systems  Constitutional: Negative for fever, chills, appetite change and fatigue.  HENT: Negative for hearing loss, ear pain, congestion, sore throat, trouble swallowing, neck stiffness, dental problem, voice change and tinnitus.   Eyes: Negative for pain, discharge and visual disturbance.  Respiratory: Positive for cough. Negative for chest tightness, wheezing and stridor.   Cardiovascular: Positive for chest pain. Negative for palpitations and leg swelling.  Gastrointestinal: Negative for nausea, vomiting, abdominal pain, diarrhea, constipation, blood in stool and abdominal distention.  Genitourinary: Negative for urgency, hematuria, flank pain, discharge, difficulty urinating and genital sores.  Musculoskeletal: Negative for myalgias, back pain, joint swelling, arthralgias and gait problem.  Skin: Negative for rash.  Neurological: Negative for dizziness, syncope, speech difficulty, weakness, numbness and headaches.  Hematological: Negative for adenopathy. Does not bruise/bleed easily.  Psychiatric/Behavioral: Negative for behavioral problems and dysphoric mood. The patient is not nervous/anxious.        Objective:   Physical Exam  Constitutional: He is oriented to person, place, and time. He appears well-developed.       Blood pressure 130/78  HENT:  Head: Normocephalic.  Right Ear: External ear normal.  Left Ear: External ear normal.  Eyes: Conjunctivae and EOM are normal.  Neck: Normal range of motion.  Cardiovascular: Normal rate and normal heart sounds.   Pulmonary/Chest:  Effort normal and breath sounds normal. No respiratory distress. He has no wheezes. He has no rales. He exhibits no tenderness.       O2 saturation 97 percent  Abdominal: Bowel sounds are normal.  Musculoskeletal: Normal range of motion. He exhibits no edema and no tenderness.  Neurological: He is alert and oriented to person, place, and time.  Psychiatric: He has a normal mood and affect. His behavior is normal.          Assessment & Plan:   Resolving viral tracheobronchitis. Patient was reassured. We'll continue symptomatic treatment Associated mild chest wall pain. He'll continue Aleve twice daily He'll call develops fever or any worsening shortness of breath

## 2011-07-28 DIAGNOSIS — L570 Actinic keratosis: Secondary | ICD-10-CM | POA: Diagnosis not present

## 2011-07-28 DIAGNOSIS — Q828 Other specified congenital malformations of skin: Secondary | ICD-10-CM | POA: Diagnosis not present

## 2011-07-28 DIAGNOSIS — L821 Other seborrheic keratosis: Secondary | ICD-10-CM | POA: Diagnosis not present

## 2011-07-28 DIAGNOSIS — I781 Nevus, non-neoplastic: Secondary | ICD-10-CM | POA: Diagnosis not present

## 2011-09-23 ENCOUNTER — Ambulatory Visit (INDEPENDENT_AMBULATORY_CARE_PROVIDER_SITE_OTHER): Payer: Medicare Other | Admitting: Internal Medicine

## 2011-09-23 ENCOUNTER — Encounter: Payer: Self-pay | Admitting: Internal Medicine

## 2011-09-23 VITALS — BP 118/78 | HR 68 | Temp 97.9°F | Resp 16 | Ht 66.5 in | Wt 157.0 lb

## 2011-09-23 DIAGNOSIS — M1990 Unspecified osteoarthritis, unspecified site: Secondary | ICD-10-CM

## 2011-09-23 DIAGNOSIS — N400 Enlarged prostate without lower urinary tract symptoms: Secondary | ICD-10-CM | POA: Diagnosis not present

## 2011-09-23 DIAGNOSIS — Z0000 Encounter for general adult medical examination without abnormal findings: Secondary | ICD-10-CM | POA: Diagnosis not present

## 2011-09-23 DIAGNOSIS — Z86010 Personal history of colonic polyps: Secondary | ICD-10-CM | POA: Diagnosis not present

## 2011-09-23 NOTE — Patient Instructions (Signed)
It is important that you exercise regularly, at least 20 minutes 3 to 4 times per week.  If you develop chest pain or shortness of breath seek  medical attention.  Return in one year for follow-up   

## 2011-09-23 NOTE — Progress Notes (Signed)
Subjective:    Patient ID: Benjamin Cardenas, male    DOB: 08/18/1929, 78 y.o.   MRN: [id number]  HPI   History of Present Illness:   68 -year-old patient who is seen today for a comprehensive evaluation. He has a history of colonic polyps osteoarthritis. Neck pain. BPH. He is followed by urology annually and is scheduled for a recheck next month. He has no concerns or complaints today.  Since his last exam he has had a left angle hernia repair.  Here for Medicare AWV:  1. Risk factors based on Past M, S, F history: no significant cardiovascular risk factors. Family history is positive for colon cancer in a first-degree relative  2. Physical Activities: exercises regularly 3 to 4 times per week. This includes walking and swimming  3. Depression/mood: no history of depression or mood disorder  4. Hearing: no deficits  5. ADL's: completely independent in all aspects of daily living  6. Fall Risk: low  7. Home Safety: no problems identified  8. Height, weight, &visual acuity:no change in height, or weight no difficulties with visual acuity has had a recent eye exam  9. Counseling: heart healthy diet, ongoing regular exercise regimen discussed and encouraged  10. Labs ordered based on risk factors: complete laboratory profile, including PSA, and lipid profile will be reviewed  11. Referral Coordination- follow-up with the urology will need a follow-up colonoscopy in one year  52. Care Plan- continued heart healthy diet regular. Exercise regimen  13. Cognitive Assessment- alert and oriented, with normal affect   Allergies:  1) ! * Z Pack  2) ! * Plague Vaccine  3) ! Amoxicillin  4) ! Sulfa  5) Cipro  6) Doxycycline Hyclate (Doxycycline Hyclate)  7) Levaquin (Levofloxacin)   Past History:  Past Medical History:  Reviewed history from 01/31/2009 and no changes required.  High cholesterol  Colonic polyps, hx of  Benign prostatic hypertrophy  neck pain  left hip extensor, weakness    Osteoarthritis   Past Surgical History:  Reviewed history from 07/31/2008 and no changes required.  Colon polypectomy  status post right angle hernia repair at age 13  colonoscopy 2007  2010 [facility]) negative for treadmill ETT May 2008  Left inguinal hernia repair  Family History:  Reviewed history from 07/22/2007 and no changes required.  Family History Breast cancer 1st degree relative <50  Family History of Colon CA 1st degree relative <60  Family History of Stroke F 1st degree relative <60  Family History of Cardiovascular disorder  father died at 50, MI  mother died age 3 complications of cerebrovascular disease, pneumonia; history of congestive heart failure  One brother. History colon cancer, status post CABG  one sister died of breast cancer at age 78   Social History:  Reviewed history from 01/06/2007 and no changes required.  Occupation: retired Married  Former Smoker  Past Medical History  Diagnosis Date  . BENIGN PROSTATIC HYPERTROPHY 01/06/2007  . COLONIC POLYPS, HX OF 01/06/2007  . NECK PAIN 02/15/2007  . Other symptoms involving cardiovascular system 09/20/2009  . Cough   . Nasal congestion   . Rash october 2012    covering majority of pts body   . Pneumonia 1985    02/05/2011-most recent  . OSTEOARTHRITIS 01/31/2009  . MRSA nasal colonization     tested postive before surgary Jan 2013   Past Surgical History  Procedure Date  . Hernia repair 76    age 27-rupture right inguinal hernia  . Inguinal  hernia repair 03/02/2011    Procedure: HERNIA REPAIR INGUINAL ADULT;  Surgeon: Earnstine Regal, MD;  Location: [facility];  Service: General;  Laterality: Left;  with mesh     reports that he quit smoking about 32 years ago. His smoking use included Cigarettes. He has a 34.5 pack-year smoking history. He has never used smokeless tobacco. He reports that he drinks about .6 ounces of alcohol per week. He reports that he does not use illicit drugs. family history  includes Cancer in his brother and sister and Heart disease in his father and mother. Allergies  Allergen Reactions  . Sulfonamide Derivatives Swelling  . Amoxicillin Rash  . Ciprofloxacin Rash    REACTION: unspecified  . Doxycycline Hyclate Rash    REACTION: unspecified  . Levofloxacin Rash    REACTION: unspecified  chx   Review of Systems  Constitutional: Negative for fever, chills, activity change, appetite change and fatigue.  HENT: Negative for hearing loss, ear pain, congestion, rhinorrhea, sneezing, mouth sores, trouble swallowing, neck pain, neck stiffness, dental problem, voice change, sinus pressure and tinnitus.   Eyes: Negative for photophobia, pain, redness and visual disturbance.  Respiratory: Negative for apnea, cough, choking, chest tightness, shortness of breath and wheezing.   Cardiovascular: Negative for chest pain, palpitations and leg swelling.  Gastrointestinal: Negative for nausea, vomiting, abdominal pain, diarrhea, constipation, blood in stool, abdominal distention, anal bleeding and rectal pain.  Genitourinary: Negative for dysuria, urgency, frequency, hematuria, flank pain, decreased urine volume, discharge, penile swelling, scrotal swelling, difficulty urinating, genital sores and testicular pain.  Musculoskeletal: Negative for myalgias, back pain, joint swelling, arthralgias and gait problem.  Skin: Positive for rash. Negative for color change and wound.       Followed by dermatology for lichen planus  Neurological: Negative for dizziness, tremors, seizures, syncope, facial asymmetry, speech difficulty, weakness, light-headedness, numbness and headaches.  Hematological: Negative for adenopathy. Does not bruise/bleed easily.  Psychiatric/Behavioral: Negative for suicidal ideas, hallucinations, behavioral problems, confusion, disturbed wake/sleep cycle, self-injury, dysphoric mood, decreased concentration and agitation. The patient is not nervous/anxious.         Objective:   Physical Exam  Constitutional: He is oriented to person, place, and time. He appears well-developed and well-nourished.  HENT:  Head: Normocephalic and atraumatic.  Right Ear: External ear normal.  Left Ear: External ear normal.  Nose: Nose normal.  Mouth/Throat: Oropharynx is clear and moist.  Eyes: Conjunctivae and EOM are normal. Pupils are equal, round, and reactive to light. No scleral icterus.  Neck: Normal range of motion. Neck supple. No JVD present. No thyromegaly present.  Cardiovascular: Normal rate, regular rhythm, normal heart sounds and intact distal pulses.  Exam reveals no gallop and no friction rub.   No murmur heard. Pulmonary/Chest: Effort normal and breath sounds normal. He exhibits no tenderness.  Abdominal: Soft. Bowel sounds are normal. He exhibits no distension and no mass. There is no tenderness.  Genitourinary: Penis normal.       Per urology  Musculoskeletal: Normal range of motion. He exhibits no edema and no tenderness.  Lymphadenopathy:    He has no cervical adenopathy.  Neurological: He is alert and oriented to person, place, and time. He has normal reflexes. No cranial nerve deficit. Coordination normal.       Left patellar reflex absent  Skin: Skin is warm and dry. No rash noted.  Psychiatric: He has a normal mood and affect. His behavior is normal.  Assessment & Plan:   Annual clinical exam BPH  followup urology next month Osteoarthritis History of colonic polyps  Heart healthy diet regular exercise encouraged. Will reassess in one year or as needed

## 2011-10-26 DIAGNOSIS — N401 Enlarged prostate with lower urinary tract symptoms: Secondary | ICD-10-CM | POA: Diagnosis not present

## 2011-10-30 DIAGNOSIS — N401 Enlarged prostate with lower urinary tract symptoms: Secondary | ICD-10-CM | POA: Diagnosis not present

## 2011-10-30 DIAGNOSIS — N402 Nodular prostate without lower urinary tract symptoms: Secondary | ICD-10-CM | POA: Diagnosis not present

## 2011-10-30 DIAGNOSIS — R972 Elevated prostate specific antigen [PSA]: Secondary | ICD-10-CM | POA: Diagnosis not present

## 2012-01-08 DIAGNOSIS — L821 Other seborrheic keratosis: Secondary | ICD-10-CM | POA: Diagnosis not present

## 2012-01-08 DIAGNOSIS — L940 Localized scleroderma [morphea]: Secondary | ICD-10-CM | POA: Diagnosis not present

## 2012-01-08 DIAGNOSIS — L259 Unspecified contact dermatitis, unspecified cause: Secondary | ICD-10-CM | POA: Diagnosis not present

## 2012-02-02 ENCOUNTER — Ambulatory Visit (INDEPENDENT_AMBULATORY_CARE_PROVIDER_SITE_OTHER): Payer: Medicare Other

## 2012-02-02 DIAGNOSIS — Z23 Encounter for immunization: Secondary | ICD-10-CM

## 2012-02-03 DIAGNOSIS — Z23 Encounter for immunization: Secondary | ICD-10-CM | POA: Diagnosis not present

## 2012-03-24 ENCOUNTER — Encounter: Payer: Self-pay | Admitting: Internal Medicine

## 2012-03-24 ENCOUNTER — Ambulatory Visit (INDEPENDENT_AMBULATORY_CARE_PROVIDER_SITE_OTHER): Payer: Medicare Other | Admitting: Internal Medicine

## 2012-03-24 VITALS — BP 100/60 | HR 70 | Temp 97.8°F | Resp 18 | Wt 164.0 lb

## 2012-03-24 DIAGNOSIS — J069 Acute upper respiratory infection, unspecified: Secondary | ICD-10-CM

## 2012-03-24 NOTE — Patient Instructions (Signed)
Acute bronchitis symptoms for less than 10 days are generally not helped by antibiotics.  Take over-the-counter expectorants and cough medications such as  Mucinex DM.  Call if there is no improvement in 5 to 7 days or if he developed worsening cough, fever, or new symptoms, such as shortness of breath or chest pain.    

## 2012-03-24 NOTE — Progress Notes (Signed)
Subjective:    Patient ID: Benjamin Cardenas, male    DOB: Apr 13, 1929, 78 y.o.   MRN: [id number]  HPI  78 year old patient who presents with a 5 to six-day history of head and chest congestion. He has had mild cough. No fever. He was concerned due to  a history of pneumonia that occurred about this time one year ago.  Past Medical History  Diagnosis Date  . BENIGN PROSTATIC HYPERTROPHY 01/06/2007  . COLONIC POLYPS, HX OF 01/06/2007  . NECK PAIN 02/15/2007  . Other symptoms involving cardiovascular system 09/20/2009  . Cough   . Nasal congestion   . Rash october 2012    covering majority of pts body   . Pneumonia 1985    02/05/2011-most recent  . OSTEOARTHRITIS 01/31/2009  . MRSA nasal colonization     tested postive before surgary Jan 2013    History   Social History  . Marital Status: Married    Spouse Name: N/A    Number of Children: N/A  . Years of Education: N/A   Occupational History  . Not on file.   Social History Main Topics  . Smoking status: Former Smoker -- 1.5 packs/day for 23 years    Types: Cigarettes    Quit date: 02/17/1979  . Smokeless tobacco: Never Used  . Alcohol Use: 0.6 oz/week    1 Cans of beer per week  . Drug Use: No  . Sexually Active: Not on file   Other Topics Concern  . Not on file   Social History Narrative  . No narrative on file    Past Surgical History  Procedure Date  . Hernia repair 30    age 22-rupture right inguinal hernia  . Inguinal hernia repair 03/02/2011    Procedure: HERNIA REPAIR INGUINAL ADULT;  Surgeon: Earnstine Regal, MD;  Location: [facility];  Service: General;  Laterality: Left;  with mesh     Family History  Problem Relation Age of Onset  . Heart disease Mother   . Heart disease Father   . Cancer Sister     breast  . Cancer Brother     colon    Allergies  Allergen Reactions  . Sulfonamide Derivatives Swelling  . Amoxicillin Rash  . Ciprofloxacin Rash    REACTION: unspecified  . Doxycycline Hyclate  Rash    REACTION: unspecified  . Levofloxacin Rash    REACTION: unspecified    Current Outpatient Prescriptions on File Prior to Visit  Medication Sig Dispense Refill  . aspirin 81 MG tablet Take 81 mg by mouth daily.       . fexofenadine (ALLEGRA) 180 MG tablet Take 180 mg by mouth daily.       . Magnesium Hydroxide (MILK OF MAGNESIA PO) Take by mouth as needed.        . naphazoline (CLEAR EYES) 0.012 % ophthalmic solution 1 drop 4 (four) times daily.       . Omega-3 Fatty Acids (FISH OIL) 1000 MG CAPS Take 1,000 mg by mouth daily.         BP 100/60  Pulse 70  Temp 97.8 F (36.6 C) (Oral)  Resp 18  Wt 164 lb (74.39 kg)  SpO2 98%      Review of Systems  Constitutional: Negative for fever, chills, appetite change and fatigue.  HENT: Positive for congestion. Negative for hearing loss, ear pain, sore throat, trouble swallowing, neck stiffness, dental problem, voice change and tinnitus.   Eyes: Negative for pain, discharge and  visual disturbance.  Respiratory: Positive for cough. Negative for chest tightness, wheezing and stridor.   Cardiovascular: Negative for chest pain, palpitations and leg swelling.  Gastrointestinal: Negative for nausea, vomiting, abdominal pain, diarrhea, constipation, blood in stool and abdominal distention.  Genitourinary: Negative for urgency, hematuria, flank pain, discharge, difficulty urinating and genital sores.  Musculoskeletal: Negative for myalgias, back pain, joint swelling, arthralgias and gait problem.  Skin: Negative for rash.  Neurological: Negative for dizziness, syncope, speech difficulty, weakness, numbness and headaches.  Hematological: Negative for adenopathy. Does not bruise/bleed easily.  Psychiatric/Behavioral: Negative for behavioral problems and dysphoric mood. The patient is not nervous/anxious.        Objective:   Physical Exam  Constitutional: He is oriented to person, place, and time. He appears well-developed.  HENT:   Head: Normocephalic.  Right Ear: External ear normal.  Left Ear: External ear normal.  Eyes: Conjunctivae normal and EOM are normal.  Neck: Normal range of motion.  Cardiovascular: Normal rate and normal heart sounds.   Pulmonary/Chest: Effort normal and breath sounds normal. No respiratory distress. He has no wheezes. He has no rales.       O2 saturation 98%  Abdominal: Bowel sounds are normal.  Musculoskeletal: Normal range of motion. He exhibits no edema and no tenderness.  Neurological: He is alert and oriented to person, place, and time.  Psychiatric: He has a normal mood and affect. His behavior is normal.          Assessment & Plan:   Viral URI with cough  We'll continue symptomatic treatment. He'll call if there is any clinical worsening or fever

## 2012-07-11 ENCOUNTER — Emergency Department (HOSPITAL_COMMUNITY)
Admission: EM | Admit: 2012-07-11 | Discharge: 2012-07-11 | Disposition: A | Discharge status: Home / Self Care | Payer: Medicare Other | Attending: Emergency Medicine | Admitting: Emergency Medicine

## 2012-07-11 ENCOUNTER — Encounter (HOSPITAL_COMMUNITY): Payer: Self-pay | Admitting: Emergency Medicine

## 2012-07-11 ENCOUNTER — Ambulatory Visit: Payer: Medicare Other

## 2012-07-11 ENCOUNTER — Ambulatory Visit (INDEPENDENT_AMBULATORY_CARE_PROVIDER_SITE_OTHER): Payer: Medicare Other | Admitting: Internal Medicine

## 2012-07-11 ENCOUNTER — Telehealth: Payer: Self-pay | Admitting: Internal Medicine

## 2012-07-11 ENCOUNTER — Encounter: Payer: Self-pay | Admitting: Internal Medicine

## 2012-07-11 VITALS — BP 140/82 | HR 86 | Temp 98.7°F | Resp 20 | Wt 157.0 lb

## 2012-07-11 DIAGNOSIS — Z8739 Personal history of other diseases of the musculoskeletal system and connective tissue: Secondary | ICD-10-CM | POA: Insufficient documentation

## 2012-07-11 DIAGNOSIS — Z87448 Personal history of other diseases of urinary system: Secondary | ICD-10-CM | POA: Insufficient documentation

## 2012-07-11 DIAGNOSIS — M1990 Unspecified osteoarthritis, unspecified site: Secondary | ICD-10-CM | POA: Insufficient documentation

## 2012-07-11 DIAGNOSIS — Z8709 Personal history of other diseases of the respiratory system: Secondary | ICD-10-CM | POA: Diagnosis not present

## 2012-07-11 DIAGNOSIS — Z860100 Personal history of colon polyps, unspecified: Secondary | ICD-10-CM | POA: Insufficient documentation

## 2012-07-11 DIAGNOSIS — M542 Cervicalgia: Secondary | ICD-10-CM

## 2012-07-11 DIAGNOSIS — R5381 Other malaise: Secondary | ICD-10-CM | POA: Diagnosis not present

## 2012-07-11 DIAGNOSIS — IMO0001 Reserved for inherently not codable concepts without codable children: Secondary | ICD-10-CM | POA: Diagnosis not present

## 2012-07-11 DIAGNOSIS — Z7982 Long term (current) use of aspirin: Secondary | ICD-10-CM | POA: Insufficient documentation

## 2012-07-11 DIAGNOSIS — Z8679 Personal history of other diseases of the circulatory system: Secondary | ICD-10-CM | POA: Insufficient documentation

## 2012-07-11 DIAGNOSIS — Z8614 Personal history of Methicillin resistant Staphylococcus aureus infection: Secondary | ICD-10-CM | POA: Insufficient documentation

## 2012-07-11 DIAGNOSIS — Z79899 Other long term (current) drug therapy: Secondary | ICD-10-CM | POA: Insufficient documentation

## 2012-07-11 DIAGNOSIS — Z8701 Personal history of pneumonia (recurrent): Secondary | ICD-10-CM | POA: Diagnosis not present

## 2012-07-11 DIAGNOSIS — R21 Rash and other nonspecific skin eruption: Secondary | ICD-10-CM

## 2012-07-11 DIAGNOSIS — M7989 Other specified soft tissue disorders: Secondary | ICD-10-CM | POA: Diagnosis not present

## 2012-07-11 DIAGNOSIS — Z87891 Personal history of nicotine dependence: Secondary | ICD-10-CM | POA: Diagnosis not present

## 2012-07-11 LAB — CBC WITH DIFFERENTIAL/PLATELET
HCT: 38.3 % — ABNORMAL LOW (ref 39.0–52.0)
Hemoglobin: 13.4 g/dL (ref 13.0–17.0)
Lymphocytes Relative: 21 % (ref 12–46)
MCHC: 35 g/dL (ref 30.0–36.0)
Monocytes Absolute: 0.4 10*3/uL (ref 0.1–1.0)
Monocytes Relative: 7 % (ref 3–12)
Neutro Abs: 4.4 10*3/uL (ref 1.7–7.7)
WBC: 6.3 10*3/uL (ref 4.0–10.5)

## 2012-07-11 LAB — URINALYSIS, ROUTINE W REFLEX MICROSCOPIC
Bilirubin Urine: NEGATIVE
Glucose, UA: NEGATIVE mg/dL
Hgb urine dipstick: NEGATIVE
Specific Gravity, Urine: 1.008 (ref 1.005–1.030)
Urobilinogen, UA: 0.2 mg/dL (ref 0.0–1.0)

## 2012-07-11 LAB — ROCKY MTN SPOTTED FVR AB, IGG-BLOOD: RMSF IgG: 0.17 IV

## 2012-07-11 LAB — COMPREHENSIVE METABOLIC PANEL
Albumin: 3.4 g/dL — ABNORMAL LOW (ref 3.5–5.2)
Alkaline Phosphatase: 86 U/L (ref 39–117)
BUN: 15 mg/dL (ref 6–23)
Chloride: 103 mEq/L (ref 96–112)
Glucose, Bld: 111 mg/dL — ABNORMAL HIGH (ref 70–99)
Potassium: 4.1 mEq/L (ref 3.5–5.1)
Total Bilirubin: 0.5 mg/dL (ref 0.3–1.2)

## 2012-07-11 LAB — ROCKY MTN SPOTTED FVR AB, IGM-BLOOD: RMSF IgM: 0.07 IV (ref 0.00–0.89)

## 2012-07-11 LAB — PROTIME-INR: Prothrombin Time: 12.2 seconds (ref 11.6–15.2)

## 2012-07-11 LAB — SEDIMENTATION RATE: Sed Rate: 10 mm/hr (ref 0–16)

## 2012-07-11 MED ORDER — DOXYCYCLINE HYCLATE 100 MG PO CAPS: 100.0000 mg | ORAL_CAPSULE | Freq: Two times a day (BID) | ORAL | Status: DC

## 2012-07-11 NOTE — ED Notes (Signed)
Noted bilateral leg and hand swelling and tight, stated swelling on the hand started on Friday, rashes also on the legs started yesterday. No open area noted. Stated having problem gripping things. Denies any numbness.

## 2012-07-11 NOTE — ED Notes (Signed)
PT. REPORTS SWELLING AT HANDS AND FEET WITH RASHES AT LOWER LEGS AND AXILLA FOR SEVERAL DAYS .

## 2012-07-11 NOTE — Patient Instructions (Signed)
Call or return to clinic prn if these symptoms worsen or fail to improve as anticipated.  Return in 8 days for followup

## 2012-07-11 NOTE — Telephone Encounter (Signed)
[facility] Triage Call Report Triage Record Num: [id number] Operator: Verneita Griffes Patient Name: Benjamin Cardenas Call Date & Time: 07/11/2012 12:03:48AM Patient Phone: [phone] PCP: Marletta Lor Patient Gender: Male PCP Fax : [phone] Patient DOB: 03-Jun-1929 Practice Name: [facility] Reason for Call: Caller: Precious Reel; PCP: Bluford Kaufmann [facility]); CB#: [phone]; Call regarding Rash/Hives; Wife reports pt hands and feet are swollen with red rash on legs up to knee. States hands hot to touch , no itching but are painful. rash red on legs and warm to touch also Temp 99.4 orally, BP 131/74 Rash Guideline - Neg responses til New Rash And any temp elevation in frail elderly Disposition - Call Provider Immediately - override to See Immediately per Nursing Judgement To [facility] for evaluation now. Protocol(s) Used: Rash Recommended Outcome per Protocol: Call Provider Immediately Override Outcome if Used in Protocol: See ED Immediately RN Reason for Override Outcome: Nursing Judgement Used. Reason for Outcome: New rash/eruptions AND any temperature elevation in an immunocompromised individual or frail elderly Care Advice: ~ 04/

## 2012-07-11 NOTE — ED Provider Notes (Signed)
History     CSN: [id number]  Arrival date & time 07/11/12  [id number]   First MD Initiated Contact with Patient 07/11/12 0114      Chief Complaint  Patient presents with  . Hand Problem  . Rash    (Consider location/radiation/quality/duration/timing/severity/associated sxs/prior treatment) HPI Pt with 2 days of rash to Bl lower ext and swelling to both hand and feet. Question possible bite to L shoulder preceeding rash. No fever chills, HA, myalgias. +fatigue. No epistaxis, blood in stool, blood in urine, recent travel.  Past Medical History  Diagnosis Date  . BENIGN PROSTATIC HYPERTROPHY 01/06/2007  . COLONIC POLYPS, HX OF 01/06/2007  . NECK PAIN 02/15/2007  . Other symptoms involving cardiovascular system 09/20/2009  . Cough   . Nasal congestion   . Rash october 2012    covering majority of pts body   . Pneumonia 1985    02/05/2011-most recent  . OSTEOARTHRITIS 01/31/2009  . MRSA nasal colonization     tested postive before surgary Jan 2013    Past Surgical History  Procedure Laterality Date  . Hernia repair  50    age 63-rupture right inguinal hernia  . Inguinal hernia repair  03/02/2011    Procedure: HERNIA REPAIR INGUINAL ADULT;  Surgeon: Earnstine Regal, MD;  Location: [facility];  Service: General;  Laterality: Left;  with mesh     Family History  Problem Relation Age of Onset  . Heart disease Mother   . Heart disease Father   . Cancer Sister     breast  . Cancer Brother     colon    History  Substance Use Topics  . Smoking status: Former Smoker -- 1.50 packs/day for 23 years    Types: Cigarettes    Quit date: 02/17/1979  . Smokeless tobacco: Never Used  . Alcohol Use: 0.6 oz/week    1 Cans of beer per week      Review of Systems  Constitutional: Negative for fever and chills.  HENT: Negative for neck pain and neck stiffness.   Respiratory: Negative for cough and shortness of breath.   Cardiovascular: Positive for leg swelling. Negative for chest pain and  palpitations.  Gastrointestinal: Negative for nausea, vomiting, abdominal pain, diarrhea and blood in stool.  Genitourinary: Negative for hematuria.  Musculoskeletal: Positive for myalgias. Negative for back pain.  Skin: Positive for rash. Negative for pallor and wound.  Neurological: Negative for dizziness, weakness, light-headedness, numbness and headaches.    Allergies  Sulfonamide derivatives; Amoxicillin; Ciprofloxacin; Doxycycline hyclate; and Levofloxacin  Home Medications   Current Outpatient Rx  Name  Route  Sig  Dispense  Refill  . aspirin 81 MG tablet   Oral   Take 81 mg by mouth daily.          . fexofenadine (ALLEGRA) 180 MG tablet   Oral   Take 180 mg by mouth daily.          . naphazoline (CLEAR EYES) 0.012 % ophthalmic solution      1 drop 4 (four) times daily.          [name] PRESCRIPTION MEDICATION   Topical   Apply 1 application topically 2 (two) times daily as needed (Cream and Ointment for rash/dry skin from dermatologist.).         [name] doxycycline (VIBRAMYCIN) 100 MG capsule   Oral   Take 1 capsule (100 mg total) by mouth 2 (two) times daily. One po bid x 7 days   14  capsule   0     BP 124/71  Pulse 87  Temp(Src) 98.2 F (36.8 C) (Oral)  Resp 16  SpO2 100%  Physical Exam  Nursing note and vitals reviewed. Constitutional: He is oriented to person, place, and time. He appears well-developed and well-nourished. No distress.  HENT:  Head: Normocephalic and atraumatic.  Mouth/Throat: Oropharynx is clear and moist.  Eyes: EOM are normal. Pupils are equal, round, and reactive to light.  Neck: Normal range of motion. Neck supple.  Cardiovascular: Normal rate and regular rhythm.   Pulmonary/Chest: Effort normal and breath sounds normal. No respiratory distress. He has no wheezes. He has no rales.  Abdominal: Soft. Bowel sounds are normal. He exhibits no distension and no mass. There is no tenderness. There is no rebound and no guarding.   Musculoskeletal: Normal range of motion. He exhibits edema (Bl feet and hand edema and blanching erythema). He exhibits no tenderness.  Neurological: He is alert and oriented to person, place, and time.  Skin: Skin is warm and dry. Rash (non blanching petichial rash from knees to feet bl. No pain. ) noted. There is erythema.  Psychiatric: He has a normal mood and affect. His behavior is normal.    ED Course  Procedures (including critical care time)  Labs Reviewed  CBC WITH DIFFERENTIAL - Abnormal; Notable for the following:    HCT 38.3 (*)    All other components within normal limits  COMPREHENSIVE METABOLIC PANEL - Abnormal; Notable for the following:    Glucose, Bld 111 (*)    Albumin 3.4 (*)    GFR calc non Af Amer 86 (*)    All other components within normal limits  PROTIME-INR  URINALYSIS, ROUTINE W REFLEX MICROSCOPIC  APTT  ROCKY MTN SPOTTED FVR AB, IGG-BLOOD  ROCKY MTN SPOTTED FVR AB, IGM-BLOOD   No results found.   1. Rash       MDM   Discussed with triad hospitalist. Suggested covering for RMSF and having f/u with the next 1-2 days with PMD to check titer. Suspect likely vasculitis. Return precautions given.    Pt has had reaction to doxycyline in the past described as rash. No airway compromise. Discussed options of waiting to start abx until seen PMD and pt states reaction was mild and would like to go ahead and start.     Julianne Rice, MD 07/11/12 0400

## 2012-07-11 NOTE — Progress Notes (Signed)
Subjective:    Patient ID: Benjamin Cardenas, male    DOB: [birth year], 79 y.o.   MRN: [id number]  HPI  79 year old patient who is seen today in followup after an ED visit there earlier today. The patient presented to the emergency room complaining of a rash involving his lower extremities as well as swelling involving his hands and feet that began 3 days ago. The patient was noted to have a petechial rash and he was started on doxycycline until RMSF   serology is available.  He has some tightness and stiffness involving his hands especially when he grips but no generalized arthralgias. Denies any fever or headache. He is unclear the duration of the rash involving his lower extremities. He has been seen by dermatology in the past for a variety of rashes the date back several years. He also states that he has had a skin rash secondary to doxycycline and 2005.  The patient is unaware of any erythematous blanching macular rash that progressed to the petechial rash. There is no rash involving his palms or soles of the feet. No tick bite exposure  ED records reviewed  Past Medical History  Diagnosis Date  . BENIGN PROSTATIC HYPERTROPHY 01/06/2007  . COLONIC POLYPS, HX OF 01/06/2007  . NECK PAIN 02/15/2007  . Other symptoms involving cardiovascular system 09/20/2009  . Cough   . Nasal congestion   . Rash october 2012    covering majority of pts body   . Pneumonia 1985    02/05/2011-most recent  . OSTEOARTHRITIS 01/31/2009  . MRSA nasal colonization     tested postive before surgary Jan 2013    History   Social History  . Marital Status: Married    Spouse Name: N/A    Number of Children: N/A  . Years of Education: N/A   Occupational History  . Not on file.   Social History Main Topics  . Smoking status: Former Smoker -- 1.50 packs/day for 23 years    Types: Cigarettes    Quit date: 02/17/1979  . Smokeless tobacco: Never Used  . Alcohol Use: 0.6 oz/week    1 Cans of beer per week  . Drug  Use: No  . Sexually Active: Not on file   Other Topics Concern  . Not on file   Social History Narrative  . No narrative on file    Past Surgical History  Procedure Laterality Date  . Hernia repair  40    age 70-rupture right inguinal hernia  . Inguinal hernia repair  03/02/2011    Procedure: HERNIA REPAIR INGUINAL ADULT;  Surgeon: Earnstine Regal, MD;  Location: [facility];  Service: General;  Laterality: Left;  with mesh     Family History  Problem Relation Age of Onset  . Heart disease Mother   . Heart disease Father   . Cancer Sister     breast  . Cancer Brother     colon    Allergies  Allergen Reactions  . Sulfonamide Derivatives Swelling  . Amoxicillin Rash  . Ciprofloxacin Rash    REACTION: unspecified  . Doxycycline Hyclate Rash    REACTION: unspecified  . Levofloxacin Rash    REACTION: unspecified    Current Outpatient Prescriptions on File Prior to Visit  Medication Sig Dispense Refill  . aspirin 81 MG tablet Take 81 mg by mouth daily.       [name] doxycycline (VIBRAMYCIN) 100 MG capsule Take 1 capsule (100 mg total) by mouth 2 (two) times daily.  One po bid x 7 days  14 capsule  0  . fexofenadine (ALLEGRA) 180 MG tablet Take 180 mg by mouth daily.       . naphazoline (CLEAR EYES) 0.012 % ophthalmic solution 1 drop 4 (four) times daily.       [name] PRESCRIPTION MEDICATION Apply 1 application topically 2 (two) times daily as needed (Cream and Ointment for rash/dry skin from dermatologist.).       No current facility-administered medications on file prior to visit.    BP 140/82  Pulse 86  Temp(Src) 98.7 F (37.1 C) (Oral)  Resp 20  Wt 157 lb (71.215 kg)  BMI 24.96 kg/m2  SpO2 98%        Review of Systems  Constitutional: Negative for fever, chills, appetite change and fatigue.  HENT: Negative for hearing loss, ear pain, congestion, sore throat, trouble swallowing, neck stiffness, dental problem, voice change and tinnitus.   Eyes: Negative for pain,  discharge and visual disturbance.  Respiratory: Negative for cough, chest tightness, wheezing and stridor.   Cardiovascular: Positive for leg swelling. Negative for chest pain and palpitations.  Gastrointestinal: Negative for nausea, vomiting, abdominal pain, diarrhea, constipation, blood in stool and abdominal distention.  Genitourinary: Negative for urgency, hematuria, flank pain, discharge, difficulty urinating and genital sores.  Musculoskeletal: Negative for myalgias, back pain, joint swelling, arthralgias and gait problem.  Skin: Positive for rash.  Neurological: Negative for dizziness, syncope, speech difficulty, weakness, numbness and headaches.  Hematological: Negative for adenopathy. Does not bruise/bleed easily.  Psychiatric/Behavioral: Negative for behavioral problems and dysphoric mood. The patient is not nervous/anxious.        Objective:   Physical Exam  Constitutional: He is oriented to person, place, and time. He appears well-developed and well-nourished. No distress.  Appears clinically well  HENT:  Head: Normocephalic.  Right Ear: External ear normal.  Left Ear: External ear normal.  Eyes: Conjunctivae and EOM are normal.  Neck: Normal range of motion.  Cardiovascular: Normal rate and normal heart sounds.   Pulmonary/Chest: Breath sounds normal.  Abdominal: Bowel sounds are normal.  Musculoskeletal: Normal range of motion. He exhibits edema. He exhibits no tenderness.  Patient has considerable puffiness involving his hands and milder swelling involving his feet  Neurological: He is alert and oriented to person, place, and time.  Skin:  A nonblanching petechial rash present involving the lower legs from the knees to the ankles. No dermatitis affecting the palms or the soles  Psychiatric: He has a normal mood and affect. His behavior is normal.          Assessment & Plan:   Petechial rash lower extremities. Unclear etiology.  Consistent with a leukocytoclastic  vasculitis. Except for the rash, little clinical suspicion for [facility] spotted fever History of cervical disc disease and osteoarthritis  ED records reviewed. Laboratory studies including white count were unremarkable will check a sed rate today. The patient has taken a single tablet of doxycycline several hours ago. The patient will discontinue if he develops any side effects. Await [facility] spotted fever serology. We'll check a sed rate. Will hold off on further studies and recheck early next week.

## 2012-07-13 ENCOUNTER — Telehealth: Payer: Self-pay | Admitting: Internal Medicine

## 2012-07-13 NOTE — Telephone Encounter (Signed)
Called for results of sed rate and tests done to rule out [place] spotted fever at office visit 07/11/12.  Since there are some abnormal values (CMP and CBC) and no comment from the MD, results not provided;  advised to follow up with office when open 07/14/12 per MD lab orders.

## 2012-07-14 NOTE — Telephone Encounter (Signed)
Left message on voicemail to call office.  

## 2012-07-14 NOTE — Telephone Encounter (Signed)
Spoke to pt told him Sed rate came back normal and [facility] Spotted fever was negative also. Pt verbalized understanding, and said he has 3 days left of Doxycycline should he finish it? Told pt I would finish antibiotic being as no side effects and keep follow up appt on 4/17. Also Dr. Raliegh Ip is out of the office until Tues and I will follow up with him with results and if there is anything else I will get back to him. Pt verbalized understanding.

## 2012-07-14 NOTE — Telephone Encounter (Signed)
Caller: Barbara/Spouse; Phone: [phone]; Reason for Call: Missed call from office nurse -regarding lab tests; he is taking Doxycycline for possible [facility] Spotted Fever.  Please call patient or spouse; they are on cell phone today.

## 2012-07-16 ENCOUNTER — Ambulatory Visit (INDEPENDENT_AMBULATORY_CARE_PROVIDER_SITE_OTHER): Payer: Medicare Other | Admitting: Family Medicine

## 2012-07-16 ENCOUNTER — Encounter: Payer: Self-pay | Admitting: Family Medicine

## 2012-07-16 VITALS — BP 122/62 | HR 76 | Temp 97.8°F | Ht 67.0 in | Wt 159.8 lb

## 2012-07-16 DIAGNOSIS — R609 Edema, unspecified: Secondary | ICD-10-CM

## 2012-07-16 MED ORDER — SPIRONOLACTONE 25 MG PO TABS: 25.0000 mg | ORAL_TABLET | Freq: Every day | ORAL | Status: DC

## 2012-07-16 NOTE — Patient Instructions (Signed)
Edema Edema is an abnormal build-up of fluids in tissues. Because this is partly dependent on gravity (water flows to the lowest place), it is more common in the legs and thighs (lower extremities). It is also common in the looser tissues, like around the eyes. Painless swelling of the feet and ankles is common and increases as a person ages. It may affect both legs and may include the calves or even thighs. When squeezed, the fluid may move out of the affected area and may leave a dent for a few moments. CAUSES   Prolonged standing or sitting in one place for extended periods of time. Movement helps pump tissue fluid into the veins, and absence of movement prevents this, resulting in edema.  Varicose veins. The valves in the veins do not work as well as they should. This causes fluid to leak into the tissues.  Fluid and salt overload.  Injury, burn, or surgery to the leg, ankle, or foot, may damage veins and allow fluid to leak out.  Sunburn damages vessels. Leaky vessels allow fluid to go out into the sunburned tissues.  Allergies (from insect bites or stings, medications or chemicals) cause swelling by allowing vessels to become leaky.  Protein in the blood helps keep fluid in your vessels. Low protein, as in malnutrition, allows fluid to leak out.  Hormonal changes, including pregnancy and menstruation, cause fluid retention. This fluid may leak out of vessels and cause edema.  Medications that cause fluid retention. Examples are sex hormones, blood pressure medications, steroid treatment, or anti-depressants.  Some illnesses cause edema, especially heart failure, kidney disease, or liver disease.  Surgery that cuts veins or lymph nodes, such as surgery done for the heart or for breast cancer, may result in edema. DIAGNOSIS  Your caregiver is usually easily able to determine what is causing your swelling (edema) by simply asking what is wrong (getting a history) and examining you (doing  a physical). Sometimes x-rays, EKG (electrocardiogram or heart tracing), and blood work may be done to evaluate for underlying medical illness. TREATMENT  General treatment includes:  Leg elevation (or elevation of the affected body part).  Restriction of fluid intake.  Prevention of fluid overload.  Compression of the affected body part. Compression with elastic bandages or support stockings squeezes the tissues, preventing fluid from entering and forcing it back into the blood vessels.  Diuretics (also called water pills or fluid pills) pull fluid out of your body in the form of increased urination. These are effective in reducing the swelling, but can have side effects and must be used only under your caregiver's supervision. Diuretics are appropriate only for some types of edema. The specific treatment can be directed at any underlying causes discovered. Heart, liver, or kidney disease should be treated appropriately. HOME CARE INSTRUCTIONS   Elevate the legs (or affected body part) above the level of the heart, while lying down.  Avoid sitting or standing still for prolonged periods of time.  Avoid putting anything directly under the knees when lying down, and do not wear constricting clothing or garters on the upper legs.  Exercising the legs causes the fluid to work back into the veins and lymphatic channels. This may help the swelling go down.  The pressure applied by elastic bandages or support stockings can help reduce ankle swelling.  A low-salt diet may help reduce fluid retention and decrease the ankle swelling.  Take any medications exactly as prescribed. SEEK MEDICAL CARE IF:  Your edema is   not responding to recommended treatments. SEEK IMMEDIATE MEDICAL CARE IF:   You develop shortness of breath or chest pain.  You cannot breathe when you lay down; or if, while lying down, you have to get up and go to the window to get your breath.  You are having increasing  swelling without relief from treatment.  You develop a fever over 102 F (38.9 C).  You develop pain or redness in the areas that are swollen.  Tell your caregiver right away if you have gained 3 lb/1.4 kg in 1 day or 5 lb/2.3 kg in a week. MAKE SURE YOU:   Understand these instructions.  Will watch your condition.  Will get help right away if you are not doing well or get worse. Document Released: 03/23/2005 Document Revised: 09/22/2011 Document Reviewed: 11/09/2007 [employer] Patient Information 2013 [employer].  

## 2012-07-16 NOTE — Progress Notes (Signed)
  Subjective:    Benjamin Cardenas is a 79 y.o. male who presents for evaluation of edema in both feet.--- last week he states his hands got very swollen too but that has resolved.  The edema has been moderate. Onset of symptoms was 1 week ago, and patient reports symptoms have gradually improved since that time. The edema is present all day. The patient states the problem is new. The swelling has been aggravated by dependency of involved area. The swelling has been relieved by elevation of involved area. Associated factors include: nothing. Cardiac risk factors: advanced age (older than 89 for men, 57 for women), male gender and obesity (BMI >= 30 kg/m2).  Pt had a rash on legs too and was seen in ER---see er visit.  Labs were done and he was given doxy--- rmsf neg.  The following portions of the patient's history were reviewed and updated as appropriate: allergies, current medications, past family history, past medical history, past social history, past surgical history and problem list.  Review of Systems Pertinent items are noted in HPI.   Objective:    BP 122/62  Pulse 76  Temp(Src) 97.8 F (36.6 C) (Oral)  Ht 5\' 7"  (1.702 m)  Wt 159 lb 12 oz (72.462 kg)  BMI 25.01 kg/m2  SpO2 97% General appearance: alert, cooperative, appears stated age and no distress Lungs: clear to auscultation bilaterally Heart: S1, S2 normal Extremities: edema b/l +1 pitting edema, no calf pain,  no errythema   Cardiographics ECG: not done  Imaging Chest x-ray: not indicated   Assessment:     Edema .    Plan:    Recommendations: decrease sodium in the diet, elevate feet above the level of the heart whenever possible and use of compression stockings. Diuretic ordered The patient was also instructed to call IMMEDIATELY (i.e., day or night) if any cardiopulmonary symptoms occur, especially chest pain, shortness of breath, dyspnea on exertion, paroxysmal nocturnal dyspnea, or orthopnea, and these were  explained. Follow up in 1 week--- keep your appointment with Dr Raliegh Ip - and as needed.

## 2012-07-18 ENCOUNTER — Telehealth: Payer: Self-pay | Admitting: Internal Medicine

## 2012-07-18 NOTE — Telephone Encounter (Signed)
[facility] Triage Call Report Triage Record Num: [id number] Operator: Doug Sou Patient Name: Benjamin Cardenas Call Date & Time: 07/16/2012 12:22:49AM Patient Phone: [phone] PCP: Marletta Lor Patient Gender: Male PCP Fax : [phone] Patient DOB: [birth year] [facility] Name[facility] Reason for Call: Caller: Precious Reel; PCP: Bluford Kaufmann [facility]); CB#: [phone]; Call regarding feet swelling. Started swelling on 07/10/12. States the feet seem to be swollen worse since 07/10/12. Hands started swelling on 07/08/12. Hand swelling looks better. Started breaking out in a rash on 07/10/12 from the ankles to both knees that was red dots. States some areas look bruised. Went to the ED on 07/10/12 and was started on Doxycycline for possible [facility] Spotted Fever. Had a Sed rate that was negative and the [facility] Spotted Fever test was negative. Was seen in the office on 07/11/12. Was called by the office on 07/14/12 and was told the lab results were negative and was instructed to finish the Doxycycline and to see Dr. Burnice Logan on 07/21/12. States the rash to the legs look sort of like bruises since 07/15/12. Had a reaction in 2004 to Doxycycline and broke out in whelps. States he is going to stop taking the Doxycycline. Triaged per Foot Non-Injury guideline. To see provider within 4 hours due to new marked swelling. Care advice given. Instructed to be seen within 4 hours and caller requests appointment for 4/12 and appointment scheduled at [facility] for 10 am on 4/12. Caller agreed. Protocol(s) Used: Foot Non-Injury Recommended Outcome per Protocol: See Provider within 4 hours Reason for Outcome: New marked swelling (twice the normal size as compared to usual appearance) Care Advice: ~ Another adult should drive. ~ Call provider if symptoms worsen or new symptoms develop. ~ Limit weight-bearing activity until evaluated by provider. Avoid  standing or sitting with legs dependent for more than 1-2 hours at a time. Change positions and move extremities every hour. Do not cross legs. Avoid tight or restricting clothing. ~ ~ SYMPTOM / CONDITION MANAGEMENT Position affected part so it is elevated at least 12 inches (30 cm) above level of heart to improve circulation and decrease discomfort. ~ ~ CAUTIONS ~ List, or take, all current prescription(s), nonprescription or alternative medication(s) to provider for evaluation. 07/16/2012 1:20:31AM Page 1 of 1 [email or web address]

## 2012-07-21 ENCOUNTER — Ambulatory Visit (INDEPENDENT_AMBULATORY_CARE_PROVIDER_SITE_OTHER): Payer: Medicare Other | Admitting: Internal Medicine

## 2012-07-21 ENCOUNTER — Encounter: Payer: Self-pay | Admitting: Internal Medicine

## 2012-07-21 VITALS — BP 110/70 | HR 68 | Temp 98.7°F | Resp 18 | Wt 155.0 lb

## 2012-07-21 DIAGNOSIS — R609 Edema, unspecified: Secondary | ICD-10-CM | POA: Diagnosis not present

## 2012-07-21 DIAGNOSIS — R600 Localized edema: Secondary | ICD-10-CM

## 2012-07-21 NOTE — Progress Notes (Signed)
Subjective:    Patient ID: Benjamin Cardenas, male    DOB: May 15, 1929, 79 y.o.   MRN: [id number]  HPI  79 year old patient who is seen today for followup. He was in the Saturday clinic complaining of worsening pedal edema. He had been seen earlier in the ED due to the petechial rash involving his lower extremities. This is also associated with considerable swelling of both hands. Basically he has improved the hand edema has resolved and he has only trace peal edema his lower extremity rash has also resolved. He generally feels quite well today. He was initially placed on doxycycline but this was discontinued when [facility] spotted fever screen was negative  Past Medical History  Diagnosis Date  . BENIGN PROSTATIC HYPERTROPHY 01/06/2007  . COLONIC POLYPS, HX OF 01/06/2007  . NECK PAIN 02/15/2007  . Other symptoms involving cardiovascular system 09/20/2009  . Cough   . Nasal congestion   . Rash october 2012    covering majority of pts body   . Pneumonia 1985    02/05/2011-most recent  . OSTEOARTHRITIS 01/31/2009  . MRSA nasal colonization     tested postive before surgary Jan 2013    History   Social History  . Marital Status: Married    Spouse Name: N/A    Number of Children: N/A  . Years of Education: N/A   Occupational History  . Not on file.   Social History Main Topics  . Smoking status: Former Smoker -- 1.50 packs/day for 23 years    Types: Cigarettes    Quit date: 02/17/1979  . Smokeless tobacco: Never Used  . Alcohol Use: 0.6 oz/week    1 Cans of beer per week  . Drug Use: No  . Sexually Active: Not on file   Other Topics Concern  . Not on file   Social History Narrative  . No narrative on file    Past Surgical History  Procedure Laterality Date  . Hernia repair  5    age 79-rupture right inguinal hernia  . Inguinal hernia repair  03/02/2011    Procedure: HERNIA REPAIR INGUINAL ADULT;  Surgeon: Earnstine Regal, MD;  Location: [facility];  Service: General;   Laterality: Left;  with mesh     Family History  Problem Relation Age of Onset  . Heart disease Mother   . Heart disease Father   . Cancer Sister     breast  . Cancer Brother     colon    Allergies  Allergen Reactions  . Sulfonamide Derivatives Swelling  . Amoxicillin Rash  . Ciprofloxacin Rash    REACTION: unspecified  . Doxycycline Hyclate Rash    REACTION: unspecified  . Levofloxacin Rash    REACTION: unspecified    Current Outpatient Prescriptions on File Prior to Visit  Medication Sig Dispense Refill  . aspirin 81 MG tablet Take 81 mg by mouth daily.       . diphenhydrAMINE (BENADRYL) 25 MG tablet Take 25 mg by mouth 2 (two) times daily.      . fexofenadine (ALLEGRA) 180 MG tablet Take 180 mg by mouth daily.       . naphazoline (CLEAR EYES) 0.012 % ophthalmic solution 1 drop 4 (four) times daily.       [name] PRESCRIPTION MEDICATION Apply 1 application topically 2 (two) times daily as needed (Cream and Ointment for rash/dry skin from dermatologist.).      [name] spironolactone (ALDACTONE) 25 MG tablet Take 1 tablet (25 mg total) by  mouth daily.  30 tablet  0   No current facility-administered medications on file prior to visit.    BP 110/70  Pulse 68  Temp(Src) 98.7 F (37.1 C) (Oral)  Resp 18  Wt 155 lb (70.308 kg)  BMI 24.27 kg/m2       Review of Systems  Constitutional: Negative for fever, chills, appetite change and fatigue.  HENT: Negative for hearing loss, ear pain, congestion, sore throat, trouble swallowing, neck stiffness, dental problem, voice change and tinnitus.   Eyes: Negative for pain, discharge and visual disturbance.  Respiratory: Negative for cough, chest tightness, wheezing and stridor.   Cardiovascular: Positive for leg swelling. Negative for chest pain and palpitations.  Gastrointestinal: Negative for nausea, vomiting, abdominal pain, diarrhea, constipation, blood in stool and abdominal distention.  Genitourinary: Negative for urgency,  hematuria, flank pain, discharge, difficulty urinating and genital sores.  Musculoskeletal: Negative for myalgias, back pain, joint swelling, arthralgias and gait problem.  Skin: Positive for rash.  Neurological: Negative for dizziness, syncope, speech difficulty, weakness, numbness and headaches.  Hematological: Negative for adenopathy. Does not bruise/bleed easily.  Psychiatric/Behavioral: Negative for behavioral problems and dysphoric mood. The patient is not nervous/anxious.        Objective:   Physical Exam  Constitutional: He is oriented to person, place, and time. He appears well-developed.  HENT:  Head: Normocephalic.  Right Ear: External ear normal.  Left Ear: External ear normal.  Eyes: Conjunctivae and EOM are normal.  Neck: Normal range of motion.  Cardiovascular: Normal rate, regular rhythm and normal heart sounds.   Pulmonary/Chest: Breath sounds normal. He has no rales.  Abdominal: Bowel sounds are normal.  Musculoskeletal: Normal range of motion. He exhibits no edema and no tenderness.  Hand edema has resolved Rash of her lower extremities has resolved Trace pedal edema  Neurological: He is alert and oriented to person, place, and time.  Psychiatric: He has a normal mood and affect. His behavior is normal.          Assessment & Plan:   Probable viral syndrome with exanthem. Now clinically resolved. Patient was reassured return here as scheduled for his general followup

## 2012-07-21 NOTE — Patient Instructions (Signed)
Limit your sodium (Salt) intake  Call or return to clinic prn if these symptoms worsen or fail to improve as anticipated.   

## 2012-08-03 DIAGNOSIS — L570 Actinic keratosis: Secondary | ICD-10-CM | POA: Diagnosis not present

## 2012-08-03 DIAGNOSIS — L259 Unspecified contact dermatitis, unspecified cause: Secondary | ICD-10-CM | POA: Diagnosis not present

## 2012-08-03 DIAGNOSIS — D239 Other benign neoplasm of skin, unspecified: Secondary | ICD-10-CM | POA: Diagnosis not present

## 2012-08-03 DIAGNOSIS — L821 Other seborrheic keratosis: Secondary | ICD-10-CM | POA: Diagnosis not present

## 2012-09-23 ENCOUNTER — Encounter: Payer: Self-pay | Admitting: Internal Medicine

## 2012-09-23 ENCOUNTER — Ambulatory Visit (INDEPENDENT_AMBULATORY_CARE_PROVIDER_SITE_OTHER): Payer: Medicare Other | Admitting: Internal Medicine

## 2012-09-23 VITALS — BP 110/72 | HR 69 | Temp 97.9°F | Resp 20 | Ht 66.0 in | Wt 157.0 lb

## 2012-09-23 DIAGNOSIS — R0989 Other specified symptoms and signs involving the circulatory and respiratory systems: Secondary | ICD-10-CM | POA: Diagnosis not present

## 2012-09-23 DIAGNOSIS — M1990 Unspecified osteoarthritis, unspecified site: Secondary | ICD-10-CM | POA: Diagnosis not present

## 2012-09-23 DIAGNOSIS — Z86010 Personal history of colonic polyps: Secondary | ICD-10-CM | POA: Diagnosis not present

## 2012-09-23 DIAGNOSIS — N400 Enlarged prostate without lower urinary tract symptoms: Secondary | ICD-10-CM | POA: Diagnosis not present

## 2012-09-23 DIAGNOSIS — Z0000 Encounter for general adult medical examination without abnormal findings: Secondary | ICD-10-CM | POA: Diagnosis not present

## 2012-09-23 LAB — POCT URINALYSIS DIPSTICK
Bilirubin, UA: NEGATIVE
Leukocytes, UA: NEGATIVE
Nitrite, UA: NEGATIVE
Protein, UA: NEGATIVE
Urobilinogen, UA: 0.2
pH, UA: 7

## 2012-09-23 LAB — TSH: TSH: 3.4 u[IU]/mL (ref 0.35–5.50)

## 2012-09-23 NOTE — Progress Notes (Signed)
Patient ID: Benjamin Cardenas, male   DOB: 01-03-1930, 79 y.o.   MRN: [id number]  Subjective:    Patient ID: Benjamin Cardenas, male    DOB: [birth year], 79 y.o.   MRN: [id number]  HPI  History of Present Illness:   79 year-old patient who is seen today for a comprehensive evaluation. He has a history of colonic polyps osteoarthritis. Neck pain. BPH. He is followed by urology annually.  He has no concerns or complaints today.  Since his last exam he has had a left inguinal  hernia repair. He complains of some memory concerns; MMSE today was normal  Here for Medicare AWV:   1. Risk factors based on Past M, S, F history: no significant cardiovascular risk factors. Family history is positive for colon cancer in a first-degree relative  2. Physical Activities: exercises regularly 3 to 4 times per week. This includes walking and swimming  3. Depression/mood: no history of depression or mood disorder  4. Hearing: no deficits  5. ADL's: completely independent in all aspects of daily living  6. Fall Risk: low  7. Home Safety: no problems identified  8. Height, weight, &visual acuity:no change in height, or weight no difficulties with visual acuity has had a recent eye exam  9. Counseling: heart healthy diet, ongoing regular exercise regimen discussed and encouraged  10. Labs ordered based on risk factors: complete laboratory profile, including PSA, and lipid profile will be reviewed  11. Referral Coordination- follow-up with the urology will need a follow-up colonoscopy in one year  53. Care Plan- continued heart healthy diet regular. Exercise regimen  13. Cognitive Assessment- alert and oriented, with normal affect   Allergies:   1) ! * Z Pack  2) ! * Plague Vaccine  3) ! Amoxicillin  4) ! Sulfa  5) Cipro  6) Doxycycline Hyclate (Doxycycline Hyclate)  7) Levaquin (Levofloxacin)   Past History:  Past Medical History:  Reviewed history from 01/31/2009 and no changes required.   High  cholesterol  Colonic polyps, hx of  Benign prostatic hypertrophy  neck pain  left hip extensor, weakness  Osteoarthritis   Past Surgical History:  Reviewed history from 07/31/2008 and no changes required.   Colon polypectomy  status post right angle hernia repair at age 79  colonoscopy 2007  2010 [facility]) negative for treadmill ETT May 2008  Left inguinal hernia repair  11/12  Family History:  Reviewed history from 07/22/2007 and no changes required.   Family History Breast cancer 1st degree relative <50  Family History of Colon CA 1st degree relative <60  brother with colon cancer Family History of Stroke F 1st degree relative <60  Family History of Cardiovascular disorder   father died at 89, MI  mother died age 73 complications of cerebrovascular disease, pneumonia; history of congestive heart failure  One brother. History colon cancer, status post CABG  one sister died of breast cancer at age 31   Social History:  Reviewed history from 01/06/2007 and no changes required.  Occupation: retired Married  Former Smoker  Past Medical History  Diagnosis Date  . BENIGN PROSTATIC HYPERTROPHY 01/06/2007  . COLONIC POLYPS, HX OF 01/06/2007  . NECK PAIN 02/15/2007  . Other symptoms involving cardiovascular system 09/20/2009  . Cough   . Nasal congestion   . Rash october 2012    covering majority of pts body   . Pneumonia 1985    02/05/2011-most recent  . OSTEOARTHRITIS 01/31/2009  . MRSA nasal colonization  tested postive before surgary Jan 2013   Past Surgical History  Procedure Laterality Date  . Hernia repair  14    age 73-rupture right inguinal hernia  . Inguinal hernia repair  03/02/2011    Procedure: HERNIA REPAIR INGUINAL ADULT;  Surgeon: Earnstine Regal, MD;  Location: [facility];  Service: General;  Laterality: Left;  with mesh     reports that he quit smoking about 33 years ago. His smoking use included Cigarettes. He has a 34.5 pack-year smoking history. He has  never used smokeless tobacco. He reports that he drinks about 0.6 ounces of alcohol per week. He reports that he does not use illicit drugs. family history includes Cancer in his brother and sister and Heart disease in his father and mother. Allergies  Allergen Reactions  . Sulfonamide Derivatives Swelling  . Amoxicillin Rash  . Ciprofloxacin Rash    REACTION: unspecified  . Doxycycline Hyclate Rash    REACTION: unspecified  . Levofloxacin Rash    REACTION: unspecified  chx   Review of Systems  Constitutional: Negative for fever, chills, activity change, appetite change and fatigue.  HENT: Negative for hearing loss, ear pain, congestion, rhinorrhea, sneezing, mouth sores, trouble swallowing, neck pain, neck stiffness, dental problem, voice change, sinus pressure and tinnitus.   Eyes: Negative for photophobia, pain, redness and visual disturbance.  Respiratory: Negative for apnea, cough, choking, chest tightness, shortness of breath and wheezing.   Cardiovascular: Negative for chest pain, palpitations and leg swelling.  Gastrointestinal: Negative for nausea, vomiting, abdominal pain, diarrhea, constipation, blood in stool, abdominal distention, anal bleeding and rectal pain.  Genitourinary: Negative for dysuria, urgency, frequency, hematuria, flank pain, decreased urine volume, discharge, penile swelling, scrotal swelling, difficulty urinating, genital sores and testicular pain.  Musculoskeletal: Negative for myalgias, back pain, joint swelling, arthralgias and gait problem.  Skin: Positive for rash. Negative for color change and wound.       Followed by dermatology for lichen planus  Neurological: Negative for dizziness, tremors, seizures, syncope, facial asymmetry, speech difficulty, weakness, light-headedness, numbness and headaches.  Hematological: Negative for adenopathy. Does not bruise/bleed easily.  Psychiatric/Behavioral: Negative for suicidal ideas, hallucinations, behavioral  problems, confusion, sleep disturbance, self-injury, dysphoric mood, decreased concentration and agitation. The patient is not nervous/anxious.        Objective:   Physical Exam  Constitutional: He is oriented to person, place, and time. He appears well-developed and well-nourished.  HENT:  Head: Normocephalic and atraumatic.  Right Ear: External ear normal.  Left Ear: External ear normal.  Nose: Nose normal.  Mouth/Throat: Oropharynx is clear and moist.  Eyes: Conjunctivae and EOM are normal. Pupils are equal, round, and reactive to light. No scleral icterus.  Neck: Normal range of motion. Neck supple. No JVD present. No thyromegaly present.  Cardiovascular: Normal rate, regular rhythm, normal heart sounds and intact distal pulses.  Exam reveals no gallop and no friction rub.   No murmur heard. Decreased right posterior tibial pulse  Pulmonary/Chest: Effort normal and breath sounds normal. He exhibits no tenderness.  Abdominal: Soft. Bowel sounds are normal. He exhibits no distension and no mass. There is no tenderness.  Genitourinary: Prostate normal and penis normal. Guaiac negative stool.  Prostate +3 enlarged  External hemorrhoidal tags  Musculoskeletal: Normal range of motion. He exhibits no edema and no tenderness.  Lymphadenopathy:    He has no cervical adenopathy.  Neurological: He is alert and oriented to person, place, and time. He has normal reflexes. No cranial nerve deficit.  Coordination normal.  Left patellar reflex absent  Skin: Skin is warm and dry. No rash noted.  Psychiatric: He has a normal mood and affect. His behavior is normal.          Assessment & Plan:   Annual clinical exam BPH   Osteoarthritis History of colonic polyps;  it has been 4 years since his last colonoscopy  Heart healthy diet,  regular exercise encouraged. Will reassess in one year or as needed

## 2012-09-23 NOTE — Patient Instructions (Signed)
Limit your sodium (Salt) intake    It is important that you exercise regularly, at least 20 minutes 3 to 4 times per week.  If you develop chest pain or shortness of breath seek  medical attention.  Return in one year for follow-up   

## 2013-01-18 ENCOUNTER — Ambulatory Visit (INDEPENDENT_AMBULATORY_CARE_PROVIDER_SITE_OTHER): Payer: Medicare Other

## 2013-01-18 DIAGNOSIS — Z23 Encounter for immunization: Secondary | ICD-10-CM | POA: Diagnosis not present

## 2013-05-12 ENCOUNTER — Ambulatory Visit (INDEPENDENT_AMBULATORY_CARE_PROVIDER_SITE_OTHER): Payer: Medicare Other | Admitting: Internal Medicine

## 2013-05-12 ENCOUNTER — Encounter: Payer: Self-pay | Admitting: Internal Medicine

## 2013-05-12 VITALS — BP 110/70 | HR 68 | Temp 98.3°F | Resp 20 | Ht 66.0 in | Wt 156.0 lb

## 2013-05-12 DIAGNOSIS — J069 Acute upper respiratory infection, unspecified: Secondary | ICD-10-CM | POA: Diagnosis not present

## 2013-05-12 NOTE — Progress Notes (Signed)
Subjective:    Patient ID: Benjamin Cardenas, male    DOB: Aug 25, 1929, 80 y.o.   MRN: [id number]  HPI  80 year old patient who presents with a 4 to five-day history of mild sore throat nasal congestion and cough. There has been no documented fever. He's also had some hoarseness which has improved. He has been using Mucinex as well as nasal saline irrigation  Past Medical History  Diagnosis Date  . BENIGN PROSTATIC HYPERTROPHY 01/06/2007  . COLONIC POLYPS, HX OF 01/06/2007  . NECK PAIN 02/15/2007  . Other symptoms involving cardiovascular system 09/20/2009  . Cough   . Nasal congestion   . Rash october 2012    covering majority of pts body   . Pneumonia 1985    02/05/2011-most recent  . OSTEOARTHRITIS 01/31/2009  . MRSA nasal colonization     tested postive before surgary Jan 2013    History   Social History  . Marital Status: Married    Spouse Name: N/A    Number of Children: N/A  . Years of Education: N/A   Occupational History  . Not on file.   Social History Main Topics  . Smoking status: Former Smoker -- 1.50 packs/day for 23 years    Types: Cigarettes    Quit date: 02/17/1979  . Smokeless tobacco: Never Used  . Alcohol Use: 0.6 oz/week    1 Cans of beer per week  . Drug Use: No  . Sexual Activity: Not on file   Other Topics Concern  . Not on file   Social History Narrative  . No narrative on file    Past Surgical History  Procedure Laterality Date  . Hernia repair  63    age 43-rupture right inguinal hernia  . Inguinal hernia repair  03/02/2011    Procedure: HERNIA REPAIR INGUINAL ADULT;  Surgeon: Earnstine Regal, MD;  Location: [facility];  Service: General;  Laterality: Left;  with mesh     Family History  Problem Relation Age of Onset  . Heart disease Mother   . Heart disease Father   . Cancer Sister     breast  . Cancer Brother     colon    Allergies  Allergen Reactions  . Sulfonamide Derivatives Swelling  . Amoxicillin Rash  . Ciprofloxacin  Rash    REACTION: unspecified  . Doxycycline Hyclate Rash    REACTION: unspecified  . Levofloxacin Rash    REACTION: unspecified    Current Outpatient Prescriptions on File Prior to Visit  Medication Sig Dispense Refill  . aspirin 81 MG tablet Take 81 mg by mouth daily.       . fexofenadine (ALLEGRA) 180 MG tablet Take 180 mg by mouth daily.       . naphazoline (CLEAR EYES) 0.012 % ophthalmic solution 1 drop 4 (four) times daily.       [name] PRESCRIPTION MEDICATION Apply 1 application topically 2 (two) times daily as needed (Cream and Ointment for rash/dry skin from dermatologist.).       No current facility-administered medications on file prior to visit.    BP 110/70  Pulse 68  Temp(Src) 98.3 F (36.8 C) (Oral)  Resp 20  Ht 5\' 6"  (1.676 m)  Wt 156 lb (70.761 kg)  BMI 25.19 kg/m2       Review of Systems  Constitutional: Negative for fever, chills, appetite change and fatigue.  HENT: Positive for postnasal drip, sinus pressure and sore throat. Negative for congestion, dental problem, ear pain, hearing  loss, tinnitus, trouble swallowing and voice change.   Eyes: Negative for pain, discharge and visual disturbance.  Respiratory: Positive for cough. Negative for chest tightness, wheezing and stridor.   Cardiovascular: Negative for chest pain, palpitations and leg swelling.  Gastrointestinal: Negative for nausea, vomiting, abdominal pain, diarrhea, constipation, blood in stool and abdominal distention.  Genitourinary: Negative for urgency, hematuria, flank pain, discharge, difficulty urinating and genital sores.  Musculoskeletal: Negative for arthralgias, back pain, gait problem, joint swelling, myalgias and neck stiffness.  Skin: Negative for rash.  Neurological: Negative for dizziness, syncope, speech difficulty, weakness, numbness and headaches.  Hematological: Negative for adenopathy. Does not bruise/bleed easily.  Psychiatric/Behavioral: Negative for behavioral problems and  dysphoric mood. The patient is not nervous/anxious.        Objective:   Physical Exam  Constitutional: He is oriented to person, place, and time. He appears well-developed.  HENT:  Head: Normocephalic.  Right Ear: External ear normal.  Left Ear: External ear normal.  Eyes: Conjunctivae and EOM are normal.  Neck: Normal range of motion.  Cardiovascular: Normal rate and normal heart sounds.   Pulmonary/Chest: Breath sounds normal.  Abdominal: Bowel sounds are normal.  Musculoskeletal: Normal range of motion. He exhibits no edema and no tenderness.  Neurological: He is alert and oriented to person, place, and time.  Psychiatric: He has a normal mood and affect. His behavior is normal.          Assessment & Plan:   Viral URI. Will continue symptomatic treatment. He was reassured. No indication for antibiotic therapy

## 2013-05-12 NOTE — Patient Instructions (Signed)
Acute bronchitis symptoms for less than 10 days are generally not helped by antibiotics.  Take over-the-counter expectorants and cough medications such as  Mucinex DM.  Call if there is no improvement in 5 to 7 days or if he developed worsening cough, fever, or new symptoms, such as shortness of breath or chest pain.    

## 2013-05-12 NOTE — Progress Notes (Signed)
Pre-visit discussion using our clinic review tool. No additional management support is needed unless otherwise documented below in the visit note.  

## 2013-05-18 ENCOUNTER — Telehealth: Payer: Self-pay | Admitting: Internal Medicine

## 2013-05-18 ENCOUNTER — Ambulatory Visit (INDEPENDENT_AMBULATORY_CARE_PROVIDER_SITE_OTHER): Payer: Medicare Other | Admitting: Internal Medicine

## 2013-05-18 ENCOUNTER — Encounter: Payer: Self-pay | Admitting: Internal Medicine

## 2013-05-18 VITALS — BP 112/70 | HR 92 | Temp 98.6°F | Resp 20 | Ht 66.0 in | Wt 155.0 lb

## 2013-05-18 DIAGNOSIS — J42 Unspecified chronic bronchitis: Secondary | ICD-10-CM

## 2013-05-18 DIAGNOSIS — M1990 Unspecified osteoarthritis, unspecified site: Secondary | ICD-10-CM

## 2013-05-18 MED ORDER — AZITHROMYCIN 250 MG PO TABS: ORAL_TABLET | ORAL | Status: DC

## 2013-05-18 NOTE — Progress Notes (Signed)
Subjective:    Patient ID: Benjamin Cardenas, male    DOB: 08-13-1929, 80 y.o.   MRN: [id number]  HPI  80 year old patient who was seen 6 days ago and treated symptomatically for a viral URI. He has clinically worsened with increasing cough now mildly productive and has developed a fever to 101. He feels generally unwell. He has been using Mucinex. He does have multiple sensitivities to antibiotics.  Past Medical History  Diagnosis Date  . BENIGN PROSTATIC HYPERTROPHY 01/06/2007  . COLONIC POLYPS, HX OF 01/06/2007  . NECK PAIN 02/15/2007  . Other symptoms involving cardiovascular system 09/20/2009  . Cough   . Nasal congestion   . Rash october 2012    covering majority of pts body   . Pneumonia 1985    02/05/2011-most recent  . OSTEOARTHRITIS 01/31/2009  . MRSA nasal colonization     tested postive before surgary Jan 2013    History   Social History  . Marital Status: Married    Spouse Name: N/A    Number of Children: N/A  . Years of Education: N/A   Occupational History  . Not on file.   Social History Main Topics  . Smoking status: Former Smoker -- 1.50 packs/day for 23 years    Types: Cigarettes    Quit date: 02/17/1979  . Smokeless tobacco: Never Used  . Alcohol Use: 0.6 oz/week    1 Cans of beer per week  . Drug Use: No  . Sexual Activity: Not on file   Other Topics Concern  . Not on file   Social History Narrative  . No narrative on file    Past Surgical History  Procedure Laterality Date  . Hernia repair  69    age 34-rupture right inguinal hernia  . Inguinal hernia repair  03/02/2011    Procedure: HERNIA REPAIR INGUINAL ADULT;  Surgeon: Earnstine Regal, MD;  Location: [facility];  Service: General;  Laterality: Left;  with mesh     Family History  Problem Relation Age of Onset  . Heart disease Mother   . Heart disease Father   . Cancer Sister     breast  . Cancer Brother     colon    Allergies  Allergen Reactions  . Sulfonamide Derivatives  Swelling  . Amoxicillin Rash  . Ciprofloxacin Rash    REACTION: unspecified  . Doxycycline Hyclate Rash    REACTION: unspecified  . Levofloxacin Rash    REACTION: unspecified    Current Outpatient Prescriptions on File Prior to Visit  Medication Sig Dispense Refill  . aspirin 81 MG tablet Take 81 mg by mouth daily.       . GuaiFENesin (MUCINEX PO) Take 1,200 tablets by mouth 2 (two) times daily.      . naphazoline (CLEAR EYES) 0.012 % ophthalmic solution 1 drop 4 (four) times daily.       [name] PRESCRIPTION MEDICATION Apply 1 application topically 2 (two) times daily as needed (Cream and Ointment for rash/dry skin from dermatologist.).      [name] fexofenadine (ALLEGRA) 180 MG tablet Take 180 mg by mouth daily.        No current facility-administered medications on file prior to visit.    BP 112/70  Pulse 92  Temp(Src) 98.6 F (37 C) (Oral)  Resp 20  Ht 5\' 6"  (1.676 m)  Wt 155 lb (70.308 kg)  BMI 25.03 kg/m2  SpO2 98%       Review of Systems  Constitutional: Positive  for activity change, appetite change and fatigue. Negative for fever and chills.  HENT: Negative for congestion, dental problem, ear pain, hearing loss, sore throat, tinnitus, trouble swallowing and voice change.   Eyes: Negative for pain, discharge and visual disturbance.  Respiratory: Positive for cough. Negative for chest tightness, wheezing and stridor.   Cardiovascular: Negative for chest pain, palpitations and leg swelling.  Gastrointestinal: Negative for nausea, vomiting, abdominal pain, diarrhea, constipation, blood in stool and abdominal distention.  Genitourinary: Negative for urgency, hematuria, flank pain, discharge, difficulty urinating and genital sores.  Musculoskeletal: Negative for arthralgias, back pain, gait problem, joint swelling, myalgias and neck stiffness.  Skin: Negative for rash.  Neurological: Negative for dizziness, syncope, speech difficulty, weakness, numbness and headaches.    Hematological: Negative for adenopathy. Does not bruise/bleed easily.  Psychiatric/Behavioral: Negative for behavioral problems and dysphoric mood. The patient is not nervous/anxious.        Objective:   Physical Exam  Constitutional: He is oriented to person, place, and time. He appears well-developed and well-nourished.  Appears unwell but in no acute distress. Temperature 98.6   HENT:  Head: Normocephalic.  Right Ear: External ear normal.  Left Ear: External ear normal.  Eyes: Conjunctivae and EOM are normal.  Neck: Normal range of motion.  Cardiovascular: Normal rate and normal heart sounds.   Pulmonary/Chest: Effort normal and breath sounds normal.  A few scattered rhonchi  Abdominal: Bowel sounds are normal.  Musculoskeletal: Normal range of motion. He exhibits no edema and no tenderness.  Neurological: He is alert and oriented to person, place, and time.  Psychiatric: He has a normal mood and affect. His behavior is normal.          Assessment & Plan:   Chronic bronchitis. We'll continue expectorants rest fluids. Will treat with azithromycin 500 mg daily for 3 days

## 2013-05-18 NOTE — Patient Instructions (Signed)
Take over-the-counter expectorants and cough medications such as  Mucinex DM.  Call if there is no improvement in 5 to 7 days or if he developed worsening cough, fever, or new symptoms, such as shortness of breath or chest pain.  Take your antibiotic as prescribed until ALL of it is gone, but stop if you develop a rash, swelling, or any side effects of the medication.  Contact our office as soon as possible if  there are side effects of the medication.

## 2013-05-18 NOTE — Telephone Encounter (Signed)
Pt is not better and now has run a fever this weeek. Pt has productive cough and may need rx. Does pt need appt or can you call in something? Wife states pt has reaction to some antibiotics and states he has now gotten some sort of reaction, maybe from musinex. Made appt for 2:30 if pt needs. pls advise. [facility]

## 2013-05-18 NOTE — Progress Notes (Signed)
Pre-visit discussion using our clinic review tool. No additional management support is needed unless otherwise documented below in the visit note.  

## 2013-05-18 NOTE — Telephone Encounter (Signed)
Please advise, pt is scheduled today at 2:30 but wants to know if you would send antibiotic in without being seen?

## 2013-07-18 DIAGNOSIS — E119 Type 2 diabetes mellitus without complications: Secondary | ICD-10-CM | POA: Diagnosis not present

## 2013-08-22 DIAGNOSIS — L089 Local infection of the skin and subcutaneous tissue, unspecified: Secondary | ICD-10-CM | POA: Diagnosis not present

## 2013-08-22 DIAGNOSIS — L821 Other seborrheic keratosis: Secondary | ICD-10-CM | POA: Diagnosis not present

## 2013-08-22 DIAGNOSIS — L723 Sebaceous cyst: Secondary | ICD-10-CM | POA: Diagnosis not present

## 2013-08-22 DIAGNOSIS — L259 Unspecified contact dermatitis, unspecified cause: Secondary | ICD-10-CM | POA: Diagnosis not present

## 2013-08-22 DIAGNOSIS — L570 Actinic keratosis: Secondary | ICD-10-CM | POA: Diagnosis not present

## 2013-09-25 ENCOUNTER — Encounter: Payer: Self-pay | Admitting: Internal Medicine

## 2013-09-25 ENCOUNTER — Ambulatory Visit (INDEPENDENT_AMBULATORY_CARE_PROVIDER_SITE_OTHER): Payer: Medicare Other | Admitting: Internal Medicine

## 2013-09-25 VITALS — BP 116/70 | HR 61 | Temp 97.8°F | Resp 20 | Ht 66.0 in | Wt 153.0 lb

## 2013-09-25 DIAGNOSIS — R0989 Other specified symptoms and signs involving the circulatory and respiratory systems: Secondary | ICD-10-CM

## 2013-09-25 DIAGNOSIS — Z0000 Encounter for general adult medical examination without abnormal findings: Secondary | ICD-10-CM | POA: Diagnosis not present

## 2013-09-25 DIAGNOSIS — E785 Hyperlipidemia, unspecified: Secondary | ICD-10-CM

## 2013-09-25 DIAGNOSIS — N400 Enlarged prostate without lower urinary tract symptoms: Secondary | ICD-10-CM

## 2013-09-25 DIAGNOSIS — Z86010 Personal history of colonic polyps: Secondary | ICD-10-CM | POA: Diagnosis not present

## 2013-09-25 DIAGNOSIS — Z23 Encounter for immunization: Secondary | ICD-10-CM | POA: Diagnosis not present

## 2013-09-25 DIAGNOSIS — M1990 Unspecified osteoarthritis, unspecified site: Secondary | ICD-10-CM

## 2013-09-25 LAB — CBC WITH DIFFERENTIAL/PLATELET
Basophils Absolute: 0 10*3/uL (ref 0.0–0.1)
Basophils Relative: 0.4 % (ref 0.0–3.0)
EOS PCT: 2.2 % (ref 0.0–5.0)
Eosinophils Absolute: 0.1 10*3/uL (ref 0.0–0.7)
HEMATOCRIT: 44.9 % (ref 39.0–52.0)
HEMOGLOBIN: 15 g/dL (ref 13.0–17.0)
LYMPHS ABS: 1.7 10*3/uL (ref 0.7–4.0)
Lymphocytes Relative: 27.8 % (ref 12.0–46.0)
MCHC: 33.4 g/dL (ref 30.0–36.0)
MCV: 93.1 fl (ref 78.0–100.0)
MONOS PCT: 6.5 % (ref 3.0–12.0)
Monocytes Absolute: 0.4 10*3/uL (ref 0.1–1.0)
Neutro Abs: 3.9 10*3/uL (ref 1.4–7.7)
Neutrophils Relative %: 63.1 % (ref 43.0–77.0)
PLATELETS: 188 10*3/uL (ref 150.0–400.0)
RBC: 4.82 Mil/uL (ref 4.22–5.81)
RDW: 14.1 % (ref 11.5–15.5)
WBC: 6.2 10*3/uL (ref 4.0–10.5)

## 2013-09-25 LAB — LIPID PANEL
CHOLESTEROL: 198 mg/dL (ref 0–200)
HDL: 83.7 mg/dL (ref >39.00)
LDL Cholesterol: 110 mg/dL — ABNORMAL HIGH (ref 0–99)
NonHDL: 114.3
TRIGLYCERIDES: 24 mg/dL (ref 0.0–149.0)
Total CHOL/HDL Ratio: 2
VLDL: 4.8 mg/dL (ref 0.0–40.0)

## 2013-09-25 LAB — COMPREHENSIVE METABOLIC PANEL
ALT: 15 U/L (ref 0–53)
AST: 24 U/L (ref 0–37)
Albumin: 4.1 g/dL (ref 3.5–5.2)
Alkaline Phosphatase: 67 U/L (ref 39–117)
BILIRUBIN TOTAL: 1 mg/dL (ref 0.2–1.2)
BUN: 10 mg/dL (ref 6–23)
CO2: 29 meq/L (ref 19–32)
CREATININE: 0.7 mg/dL (ref 0.4–1.5)
Calcium: 9.5 mg/dL (ref 8.4–10.5)
Chloride: 105 mEq/L (ref 96–112)
GFR: 112.87 mL/min (ref >60.00)
GLUCOSE: 91 mg/dL (ref 70–99)
Potassium: 4.3 mEq/L (ref 3.5–5.1)
Sodium: 140 mEq/L (ref 135–145)
TOTAL PROTEIN: 6.5 g/dL (ref 6.0–8.3)

## 2013-09-25 LAB — TSH: TSH: 3.15 u[IU]/mL (ref 0.35–4.50)

## 2013-09-25 NOTE — Progress Notes (Signed)
Patient ID: Benjamin Cardenas, male   DOB: [birth year], 80 y.o.   MRN: [id number]  Subjective:    Patient ID: Benjamin Cardenas, male    DOB: 09-05-1929, 80 y.o.   MRN: [id number]  HPI   History of Present Illness:   80  year-old patient who is seen today for a comprehensive evaluation.  He has a history of colonic polyps osteoarthritis. Neck pain. BPH. He is followed by urology annually.  He has no concerns or complaints today.  Since his last exam he has had a left inguinal  hernia repair. He complains of some memory concerns;  MMSE 2014  was normal  Complaints today include episodes of bloody ejaculate as well as some perineal discomfort.  He has been seen by urology in the past and wishes referral  Here for Medicare AWV:   1. Risk factors based on Past M, S, F history: no significant cardiovascular risk factors. Family history is positive for colon cancer in a first-degree relative  2. Physical Activities: exercises regularly 3 to 4 times per week. This includes walking and swimming  3. Depression/mood: no history of depression or mood disorder  4. Hearing: no deficits  5. ADL's: completely independent in all aspects of daily living  6. Fall Risk: low  7. Home Safety: no problems identified  8. Height, weight, &visual acuity:no change in height, or weight no difficulties with visual acuity has had a recent eye exam  9. Counseling: heart healthy diet, ongoing regular exercise regimen discussed and encouraged  10. Labs ordered based on risk factors: complete laboratory profile, including PSA, and lipid profile will be reviewed  11. Referral Coordination- follow-up with the urology will need a follow-up colonoscopy in one year  32. Care Plan- continued heart healthy diet regular. Exercise regimen  13. Cognitive Assessment- alert and oriented, with normal affect   Allergies:   1) ! * Z Pack  2) ! * Plague Vaccine  3) ! Amoxicillin  4) ! Sulfa  5) Cipro  6) Doxycycline Hyclate  (Doxycycline Hyclate)  7) Levaquin (Levofloxacin)   Past History:  Past Medical History:    High cholesterol  Colonic polyps, hx of  Benign prostatic hypertrophy  neck pain  left hip extensor, weakness  Osteoarthritis   Past Surgical History:    Colon polypectomy  status post right angle hernia repair at age 40  colonoscopy 2007  2010 [facility]) negative for treadmill ETT May 2008  Left inguinal hernia repair  11/12  Family History:    Family History Breast cancer 1st degree relative <50  Family History of Colon CA 1st degree relative <60  brother with colon cancer Family History of Stroke F 1st degree relative <60  Family History of Cardiovascular disorder   father died at 79, MI  mother died age 54 complications of cerebrovascular disease, pneumonia; history of congestive heart failure  One brother. History colon cancer, status post CABG  one sister died of breast cancer at age 40   Social History:   Occupation: retired Married  Former Smoker  Past Medical History  Diagnosis Date  . BENIGN PROSTATIC HYPERTROPHY 01/06/2007  . COLONIC POLYPS, HX OF 01/06/2007  . NECK PAIN 02/15/2007  . Other symptoms involving cardiovascular system 09/20/2009  . Cough   . Nasal congestion   . Rash october 2012    covering majority of pts body   . Pneumonia 1985    02/05/2011-most recent  . OSTEOARTHRITIS 01/31/2009  . MRSA nasal colonization  tested postive before surgary Jan 2013   Past Surgical History  Procedure Laterality Date  . Hernia repair  60    age 7-rupture right inguinal hernia  . Inguinal hernia repair  03/02/2011    Procedure: HERNIA REPAIR INGUINAL ADULT;  Surgeon: Earnstine Regal, MD;  Location: [facility];  Service: General;  Laterality: Left;  with mesh     reports that he quit smoking about 34 years ago. His smoking use included Cigarettes. He has a 34.5 pack-year smoking history. He has never used smokeless tobacco. He reports that he drinks about .6  ounces of alcohol per week. He reports that he does not use illicit drugs. family history includes Cancer in his brother and sister; Heart disease in his father and mother. Allergies  Allergen Reactions  . Sulfonamide Derivatives Swelling  . Amoxicillin Rash  . Ciprofloxacin Rash    REACTION: unspecified  . Doxycycline Hyclate Rash    REACTION: unspecified  . Levofloxacin Rash    REACTION: unspecified  chx   Review of Systems  Constitutional: Negative for fever, chills, activity change, appetite change and fatigue.  HENT: Negative for congestion, dental problem, ear pain, hearing loss, mouth sores, rhinorrhea, sinus pressure, sneezing, tinnitus, trouble swallowing and voice change.   Eyes: Negative for photophobia, pain, redness and visual disturbance.  Respiratory: Negative for apnea, cough, choking, chest tightness, shortness of breath and wheezing.   Cardiovascular: Negative for chest pain, palpitations and leg swelling.  Gastrointestinal: Negative for nausea, vomiting, abdominal pain, diarrhea, constipation, blood in stool, abdominal distention, anal bleeding and rectal pain.  Genitourinary: Negative for dysuria, urgency, frequency, hematuria, flank pain, decreased urine volume, discharge, penile swelling, scrotal swelling, difficulty urinating, genital sores and testicular pain.  Musculoskeletal: Negative for arthralgias, back pain, gait problem, joint swelling, myalgias, neck pain and neck stiffness.  Skin: Positive for rash. Negative for color change and wound.       Followed by dermatology for lichen planus  Neurological: Negative for dizziness, tremors, seizures, syncope, facial asymmetry, speech difficulty, weakness, light-headedness, numbness and headaches.  Hematological: Negative for adenopathy. Does not bruise/bleed easily.  Psychiatric/Behavioral: Negative for suicidal ideas, hallucinations, behavioral problems, confusion, sleep disturbance, self-injury, dysphoric mood,  decreased concentration and agitation. The patient is not nervous/anxious.        Objective:   Physical Exam  Constitutional: He is oriented to person, place, and time. He appears well-developed and well-nourished.  HENT:  Head: Normocephalic and atraumatic.  Right Ear: External ear normal.  Left Ear: External ear normal.  Nose: Nose normal.  Mouth/Throat: Oropharynx is clear and moist.  Eyes: Conjunctivae and EOM are normal. Pupils are equal, round, and reactive to light. No scleral icterus.  Neck: Normal range of motion. Neck supple. No JVD present. No thyromegaly present.  Cardiovascular: Normal rate, regular rhythm, normal heart sounds and intact distal pulses.  Exam reveals no gallop and no friction rub.   No murmur heard. Pulses are faint.  Posterior tibial pulses are full Faint right femoral bruit  Pulmonary/Chest: Effort normal and breath sounds normal. He exhibits no tenderness.  Abdominal: Soft. Bowel sounds are normal. He exhibits no distension and no mass. There is no tenderness.  Genitourinary: Prostate normal and penis normal.  Prostate +3 enlarged (2014)  External hemorrhoidal tags (2014)  Exam deferred 2015 due to urology referral  Musculoskeletal: Normal range of motion. He exhibits no edema and no tenderness.  Lymphadenopathy:    He has no cervical adenopathy.  Neurological: He is alert and  oriented to person, place, and time. He has normal reflexes. No cranial nerve deficit. Coordination normal.  Left patellar reflex absent  Skin: Skin is warm and dry. No rash noted.  Psychiatric: He has a normal mood and affect. His behavior is normal.          Assessment & Plan:   Annual clinical exam BPH   Osteoarthritis History of colonic polyps;  it has been 5 years since his last colonoscopy  Heart healthy diet,  regular exercise encouraged. Will reassess in one year or as needed

## 2013-09-25 NOTE — Progress Notes (Signed)
Pre-visit discussion using our clinic review tool. No additional management support is needed unless otherwise documented below in the visit note.  

## 2013-09-25 NOTE — Patient Instructions (Signed)
Limit your sodium (Salt) intake  Urology followup as discussed    It is important that you exercise regularly, at least 20 minutes 3 to 4 times per week.  If you develop chest pain or shortness of breath seek  medical attention.  Return in one year for follow-up

## 2013-09-29 ENCOUNTER — Telehealth: Payer: Self-pay | Admitting: Internal Medicine

## 2013-09-29 NOTE — Telephone Encounter (Signed)
Spoke to pt told him labs were released and he should be able to see them in My Chart. Pt verbalized understanding.

## 2013-09-29 NOTE — Telephone Encounter (Signed)
Pt called and requested that his lab results be posted in my chart today because he is going out of town

## 2013-10-02 DIAGNOSIS — N139 Obstructive and reflux uropathy, unspecified: Secondary | ICD-10-CM | POA: Diagnosis not present

## 2013-10-02 DIAGNOSIS — N419 Inflammatory disease of prostate, unspecified: Secondary | ICD-10-CM | POA: Diagnosis not present

## 2013-10-02 DIAGNOSIS — N138 Other obstructive and reflux uropathy: Secondary | ICD-10-CM | POA: Diagnosis not present

## 2013-10-02 DIAGNOSIS — R361 Hematospermia: Secondary | ICD-10-CM | POA: Diagnosis not present

## 2013-10-02 DIAGNOSIS — N401 Enlarged prostate with lower urinary tract symptoms: Secondary | ICD-10-CM | POA: Diagnosis not present

## 2014-01-24 ENCOUNTER — Ambulatory Visit (INDEPENDENT_AMBULATORY_CARE_PROVIDER_SITE_OTHER): Payer: Medicare Other

## 2014-01-24 DIAGNOSIS — Z23 Encounter for immunization: Secondary | ICD-10-CM

## 2014-02-27 DIAGNOSIS — R197 Diarrhea, unspecified: Secondary | ICD-10-CM | POA: Diagnosis not present

## 2014-02-27 DIAGNOSIS — R1310 Dysphagia, unspecified: Secondary | ICD-10-CM | POA: Diagnosis not present

## 2014-03-21 DIAGNOSIS — K641 Second degree hemorrhoids: Secondary | ICD-10-CM | POA: Diagnosis not present

## 2014-04-12 ENCOUNTER — Ambulatory Visit (INDEPENDENT_AMBULATORY_CARE_PROVIDER_SITE_OTHER): Payer: Medicare Other | Admitting: Family Medicine

## 2014-04-12 ENCOUNTER — Encounter: Payer: Self-pay | Admitting: Family Medicine

## 2014-04-12 VITALS — BP 112/70 | HR 71 | Temp 98.0°F | Wt 156.0 lb

## 2014-04-12 DIAGNOSIS — L02211 Cutaneous abscess of abdominal wall: Secondary | ICD-10-CM | POA: Diagnosis not present

## 2014-04-12 NOTE — Progress Notes (Signed)
Pre visit review using our clinic review tool, if applicable. No additional management support is needed unless otherwise documented below in the visit note. 

## 2014-04-12 NOTE — Progress Notes (Signed)
   Subjective:    Patient ID: Benjamin Cardenas, male    DOB: December 13, 1929, 81 y.o.   MRN: [id number]  HPI Patient seen with abdominal pain. He first noticed couple days ago. He is an avid exerciser and does lots of abdominal exercises but denies any injury. No fevers or chills. He noticed indurated area just superior and lateral to the left of umbilicus. He does state he has prior history of MRSA. He has multiple drug intolerances  Past Medical History  Diagnosis Date  . BENIGN PROSTATIC HYPERTROPHY 01/06/2007  . COLONIC POLYPS, HX OF 01/06/2007  . NECK PAIN 02/15/2007  . Other symptoms involving cardiovascular system 09/20/2009  . Cough   . Nasal congestion   . Rash october 2012    covering majority of pts body   . Pneumonia 1985    02/05/2011-most recent  . OSTEOARTHRITIS 01/31/2009  . MRSA nasal colonization     tested postive before surgary Jan 2013   Past Surgical History  Procedure Laterality Date  . Hernia repair  43    age 51-rupture right inguinal hernia  . Inguinal hernia repair  03/02/2011    Procedure: HERNIA REPAIR INGUINAL ADULT;  Surgeon: Earnstine Regal, MD;  Location: [facility];  Service: General;  Laterality: Left;  with mesh     reports that he quit smoking about 35 years ago. His smoking use included Cigarettes. He has a 34.5 pack-year smoking history. He has never used smokeless tobacco. He reports that he drinks about 0.6 oz of alcohol per week. He reports that he does not use illicit drugs. family history includes Cancer in his brother and sister; Heart disease in his father and mother. Allergies  Allergen Reactions  . Sulfonamide Derivatives Swelling  . Amoxicillin Rash  . Ciprofloxacin Rash    REACTION: unspecified  . Doxycycline Hyclate Rash    REACTION: unspecified  . Levofloxacin Rash    REACTION: unspecified      Review of Systems  Constitutional: Negative for fever and chills.  Gastrointestinal: Positive for abdominal pain. Negative for nausea and  vomiting.       Objective:   Physical Exam  Constitutional: He appears well-developed and well-nourished.  Cardiovascular: Normal rate and regular rhythm.   Pulmonary/Chest: Effort normal and breath sounds normal. No respiratory distress. He has no wheezes. He has no rales.  Abdominal: Soft.  He has ventral hernia which is soft and nontender. He has indurated area just superior and lateral to the umbilicus  Skin:  He has pustule noted within the umbilicus region. Underneath this there is small area of erythema and fluctuance. We used a #25-gauge needle and unroofed the pustule and able to express fairly copious amount of purulent drainage.          Assessment & Plan:  Abscess abdominal wall -umbilicus. This appeared to be almost more of a furuncle that was on the surface. This was unroofed with expression of pus as above. Culture sent. Given his multiple drug intolerances we've recommended not starting oral antibiosis point follow-up promptly for fever or increased erythema or worsening symptoms.  This was a simple abscess- no packing.  He has ventral hernia which is asymptomatic and unrelated to his current pain.

## 2014-04-12 NOTE — Patient Instructions (Signed)
Warm compresses to abdominal wall 2-3 times daily Follow up for any fever or increased redness of skin.

## 2014-04-15 ENCOUNTER — Encounter: Payer: Self-pay | Admitting: Family Medicine

## 2014-04-15 LAB — WOUND CULTURE
GRAM STAIN: NONE SEEN
Gram Stain: NONE SEEN

## 2014-04-18 DIAGNOSIS — K641 Second degree hemorrhoids: Secondary | ICD-10-CM | POA: Diagnosis not present

## 2014-04-19 DIAGNOSIS — K222 Esophageal obstruction: Secondary | ICD-10-CM | POA: Diagnosis not present

## 2014-04-19 DIAGNOSIS — R1310 Dysphagia, unspecified: Secondary | ICD-10-CM | POA: Diagnosis not present

## 2014-05-02 DIAGNOSIS — K641 Second degree hemorrhoids: Secondary | ICD-10-CM | POA: Diagnosis not present

## 2014-05-24 DIAGNOSIS — K635 Polyp of colon: Secondary | ICD-10-CM | POA: Diagnosis not present

## 2014-05-24 DIAGNOSIS — D122 Benign neoplasm of ascending colon: Secondary | ICD-10-CM | POA: Diagnosis not present

## 2014-05-24 DIAGNOSIS — Z86010 Personal history of colonic polyps: Secondary | ICD-10-CM | POA: Diagnosis not present

## 2014-05-24 LAB — HM COLONOSCOPY

## 2014-07-18 ENCOUNTER — Encounter: Payer: Self-pay | Admitting: Internal Medicine

## 2014-07-19 ENCOUNTER — Encounter: Payer: Self-pay | Admitting: Internal Medicine

## 2014-07-19 ENCOUNTER — Encounter: Payer: Self-pay | Admitting: *Deleted

## 2014-09-20 ENCOUNTER — Ambulatory Visit (INDEPENDENT_AMBULATORY_CARE_PROVIDER_SITE_OTHER): Payer: Medicare Other | Admitting: Internal Medicine

## 2014-09-20 ENCOUNTER — Encounter: Payer: Self-pay | Admitting: Internal Medicine

## 2014-09-20 VITALS — BP 110/64 | HR 79 | Temp 99.6°F | Resp 20 | Ht 66.0 in | Wt 150.0 lb

## 2014-09-20 DIAGNOSIS — J069 Acute upper respiratory infection, unspecified: Secondary | ICD-10-CM

## 2014-09-20 DIAGNOSIS — B9789 Other viral agents as the cause of diseases classified elsewhere: Principal | ICD-10-CM

## 2014-09-20 MED ORDER — HYDROCODONE-HOMATROPINE 5-1.5 MG/5ML PO SYRP: 5.0000 mL | ORAL_SOLUTION | Freq: Four times a day (QID) | ORAL | Status: AC | PRN

## 2014-09-20 NOTE — Progress Notes (Signed)
Subjective:    Patient ID: Benjamin Cardenas, male    DOB: May 04, 1929, 81 y.o.   MRN: [id number]  HPI 81 year old patient who presents with a 3-4 day history of cough, congestion, fatigue and low-grade fever He does have a history of allergic rhinitis and does take Allegra occasionally.  He has been using Aleve for fever control  Past Medical History  Diagnosis Date  . BENIGN PROSTATIC HYPERTROPHY 01/06/2007  . COLONIC POLYPS, HX OF 01/06/2007  . NECK PAIN 02/15/2007  . Other symptoms involving cardiovascular system 09/20/2009  . Cough   . Nasal congestion   . Rash october 2012    covering majority of pts body   . Pneumonia 1985    02/05/2011-most recent  . OSTEOARTHRITIS 01/31/2009  . MRSA nasal colonization     tested postive before surgary Jan 2013    History   Social History  . Marital Status: Married    Spouse Name: N/A  . Number of Children: N/A  . Years of Education: N/A   Occupational History  . Not on file.   Social History Main Topics  . Smoking status: Former Smoker -- 1.50 packs/day for 23 years    Types: Cigarettes    Quit date: 02/17/1979  . Smokeless tobacco: Never Used  . Alcohol Use: 0.6 oz/week    1 Cans of beer per week  . Drug Use: No  . Sexual Activity: Not on file   Other Topics Concern  . Not on file   Social History Narrative    Past Surgical History  Procedure Laterality Date  . Hernia repair  57    age 81-rupture right inguinal hernia  . Inguinal hernia repair  03/02/2011    Procedure: HERNIA REPAIR INGUINAL ADULT;  Surgeon: Earnstine Regal, MD;  Location: [facility];  Service: General;  Laterality: Left;  with mesh     Family History  Problem Relation Age of Onset  . Heart disease Mother   . Heart disease Father   . Cancer Sister     breast  . Cancer Brother     colon    Allergies  Allergen Reactions  . Sulfonamide Derivatives Swelling  . Amoxicillin Rash  . Ciprofloxacin Rash    REACTION: unspecified  . Doxycycline  Hyclate Rash    REACTION: unspecified  . Levofloxacin Rash    REACTION: unspecified    Current Outpatient Prescriptions on File Prior to Visit  Medication Sig Dispense Refill  . aspirin 81 MG tablet Take 81 mg by mouth daily.     . fexofenadine (ALLEGRA) 180 MG tablet Take 180 mg by mouth daily.     . naphazoline (CLEAR EYES) 0.012 % ophthalmic solution 1 drop 4 (four) times daily.     . naproxen sodium (ALEVE) 220 MG tablet Take 220 mg by mouth 2 (two) times daily with a meal.    . PRESCRIPTION MEDICATION Apply 1 application topically 2 (two) times daily as needed (Cream and Ointment for rash/dry skin from dermatologist.).     No current facility-administered medications on file prior to visit.    BP 110/64 mmHg  Pulse 79  Temp(Src) 99.6 F (37.6 C) (Oral)  Resp 20  Ht 5\' 6"  (1.676 m)  Wt 150 lb (68.04 kg)  BMI 24.22 kg/m2  SpO2 98%     Review of Systems  Constitutional: Positive for fever, activity change, appetite change and fatigue. Negative for chills.  HENT: Positive for congestion. Negative for dental problem, ear pain, hearing  loss, sore throat, tinnitus, trouble swallowing and voice change.   Eyes: Negative for pain, discharge and visual disturbance.  Respiratory: Positive for cough. Negative for chest tightness, wheezing and stridor.   Cardiovascular: Negative for chest pain, palpitations and leg swelling.  Gastrointestinal: Negative for nausea, vomiting, abdominal pain, diarrhea, constipation, blood in stool and abdominal distention.  Genitourinary: Negative for urgency, hematuria, flank pain, discharge, difficulty urinating and genital sores.  Musculoskeletal: Negative for myalgias, back pain, joint swelling, arthralgias, gait problem and neck stiffness.  Skin: Negative for rash.  Neurological: Negative for dizziness, syncope, speech difficulty, weakness, numbness and headaches.  Hematological: Negative for adenopathy. Does not bruise/bleed easily.    Psychiatric/Behavioral: Negative for behavioral problems and dysphoric mood. The patient is not nervous/anxious.        Objective:   Physical Exam  Constitutional: He is oriented to person, place, and time. He appears well-developed.  HENT:  Head: Normocephalic.  Right Ear: External ear normal.  Left Ear: External ear normal.  Eyes: Conjunctivae and EOM are normal.  Neck: Normal range of motion.  Cardiovascular: Normal rate and normal heart sounds.   Pulmonary/Chest: Breath sounds normal.  Abdominal: Bowel sounds are normal.  Musculoskeletal: Normal range of motion. He exhibits no edema or tenderness.  Neurological: He is alert and oriented to person, place, and time.  Psychiatric: He has a normal mood and affect. His behavior is normal.          Assessment & Plan:   Viral URI with cough.  Will treat symptomatically Osteoarthritis, stable History of BPH.  Will avoid decongestants  CPX as scheduled next week

## 2014-09-20 NOTE — Patient Instructions (Signed)
Acute bronchitis symptoms for less than 10 days are generally not helped by antibiotics.  Take over-the-counter expectorants and cough medications such as  Mucinex DM.  Call if there is no improvement in 5 to 7 days or if  you develop worsening cough, fever, or new symptoms, such as shortness of breath or chest pain.  HOME CARE INSTRUCTIONS  Get plenty of rest.  Drink enough fluids to keep your urine clear or pale yellow (unless you have a medical condition that requires fluid restriction). Increasing fluids may help thin your respiratory secretions (sputum) and reduce chest congestion, and it will prevent dehydration.  Take medicines only as directed by your health care provider.  If you were prescribed an antibiotic medicine, finish it all even if you start to feel better.  Avoid smoking and secondhand smoke. Exposure to cigarette smoke or irritating chemicals will make bronchitis worse. If you are a smoker, consider using nicotine gum or skin patches to help control withdrawal symptoms. Quitting smoking will help your lungs heal faster.  Reduce the chances of another bout of acute bronchitis by washing your hands frequently, avoiding people with cold symptoms, and trying not to touch your hands to your mouth, nose, or eyes.

## 2014-09-20 NOTE — Progress Notes (Signed)
Pre visit review using our clinic review tool, if applicable. No additional management support is needed unless otherwise documented below in the visit note. 

## 2014-09-27 ENCOUNTER — Encounter: Payer: Medicare Other | Admitting: Internal Medicine

## 2014-10-03 ENCOUNTER — Telehealth: Payer: Self-pay | Admitting: Internal Medicine

## 2014-10-03 NOTE — Telephone Encounter (Signed)
Spoke to pt, still c/o cough expectorating yellow mucus. Having coughing spells at night, using cough syrup as directed. Please advise.

## 2014-10-03 NOTE — Telephone Encounter (Signed)
Pt call to say that he still has a cough on and off phelmn. Pt did fill cough syrup. Would like a call back

## 2014-10-04 DIAGNOSIS — L309 Dermatitis, unspecified: Secondary | ICD-10-CM | POA: Diagnosis not present

## 2014-10-04 DIAGNOSIS — L570 Actinic keratosis: Secondary | ICD-10-CM | POA: Diagnosis not present

## 2014-10-04 DIAGNOSIS — L821 Other seborrheic keratosis: Secondary | ICD-10-CM | POA: Diagnosis not present

## 2014-10-19 DIAGNOSIS — H2513 Age-related nuclear cataract, bilateral: Secondary | ICD-10-CM | POA: Diagnosis not present

## 2014-11-01 ENCOUNTER — Other Ambulatory Visit: Payer: Self-pay | Admitting: Internal Medicine

## 2014-11-01 ENCOUNTER — Encounter: Payer: Self-pay | Admitting: Internal Medicine

## 2014-11-01 DIAGNOSIS — M6530 Trigger finger, unspecified finger: Secondary | ICD-10-CM

## 2014-11-05 DIAGNOSIS — M79641 Pain in right hand: Secondary | ICD-10-CM | POA: Diagnosis not present

## 2014-12-04 ENCOUNTER — Ambulatory Visit (INDEPENDENT_AMBULATORY_CARE_PROVIDER_SITE_OTHER): Payer: Medicare Other | Admitting: Internal Medicine

## 2014-12-04 ENCOUNTER — Encounter: Payer: Self-pay | Admitting: Internal Medicine

## 2014-12-04 VITALS — BP 102/68 | HR 73 | Temp 97.9°F | Resp 18 | Ht 66.0 in | Wt 147.0 lb

## 2014-12-04 DIAGNOSIS — E785 Hyperlipidemia, unspecified: Secondary | ICD-10-CM

## 2014-12-04 DIAGNOSIS — R413 Other amnesia: Secondary | ICD-10-CM

## 2014-12-04 DIAGNOSIS — Z86010 Personal history of colonic polyps: Secondary | ICD-10-CM

## 2014-12-04 DIAGNOSIS — Z0000 Encounter for general adult medical examination without abnormal findings: Secondary | ICD-10-CM | POA: Diagnosis not present

## 2014-12-04 DIAGNOSIS — M150 Primary generalized (osteo)arthritis: Secondary | ICD-10-CM

## 2014-12-04 DIAGNOSIS — M159 Polyosteoarthritis, unspecified: Secondary | ICD-10-CM

## 2014-12-04 NOTE — Progress Notes (Signed)
Patient ID: Benjamin Cardenas, male   DOB: 11-01-1929, 81 y.o.   MRN: [id number]  Subjective:    Patient ID: Benjamin Cardenas, male    DOB: September 28, 1929, 81 y.o.   MRN: [id number]  HPI   History of Present Illness:   81   year-old patient who is seen today for a comprehensive evaluation.  He has a history of colonic polyps osteoarthritis. Neck pain. BPH. He is followed by urology annually.  He has no concerns or complaints today.  He has had a left inguinal  hernia repair. He complains of some memory concerns;  MMSE 2014  was normal.  This was repeated today and again had a score of 30/30 He has seen Dr. Earlean Shawl earlier 2016 with colonoscopy and EGD with dilatation    Here for Medicare AWV:   1. Risk factors based on Past M, S, F history: no significant cardiovascular risk factors. Family history is positive for colon cancer in a first-degree relative  2. Physical Activities: exercises regularly 3 to 4 times per week. This includes walking and swimming  3. Depression/mood: no history of depression or mood disorder  4. Hearing: no deficits  5. ADL's: completely independent in all aspects of daily living  6. Fall Risk: low  7. Home Safety: no problems identified  8. Height, weight, &visual acuity:no change in height, or weight no difficulties with visual acuity has had a recent eye exam  9. Counseling: heart healthy diet, ongoing regular exercise regimen discussed and encouraged  10. Labs ordered based on risk factors: complete laboratory profile, including PSA, and lipid profile will be reviewed  11. Referral Coordination- follow-up with the urology will need a follow-up colonoscopy in one year  41. Care Plan- continued heart healthy diet regular. Exercise regimen  13. Cognitive Assessment- alert and oriented, with normal affect  14.  Preventive services will include annual clinical exams with screening lab.  No further colonoscopies will be entertained 15.  Provider list includes primary  care medicine and ophthalmology GI and urology  Allergies:   1) ! * Z Pack  2) ! * Plague Vaccine  3) ! Amoxicillin  4) ! Sulfa  5) Cipro  6) Doxycycline Hyclate (Doxycycline Hyclate)  7) Levaquin (Levofloxacin)   Past History:  Past Medical History:    High cholesterol  Colonic polyps, hx of  Benign prostatic hypertrophy  neck pain  left hip extensor, weakness  Osteoarthritis   Past Surgical History:    Colon polypectomy  status post right angle hernia repair at age 40  colonoscopy 2007  2010 [facility]) February 2016 negative for treadmill ETT May 2008  Left inguinal hernia repair  11/12  Family History:    Family History Breast cancer 1st degree relative <50  Family History of Colon CA 1st degree relative <60  brother with colon cancer Family History of Stroke F 1st degree relative <60  Family History of Cardiovascular disorder   father died at 37, MI  mother died age 67 complications of cerebrovascular disease, pneumonia; history of congestive heart failure  One brother. History colon cancer, status post CABG  one sister died of breast cancer at age 69   Social History:   Occupation: retired Married  Former Smoker  Past Medical History  Diagnosis Date  . BENIGN PROSTATIC HYPERTROPHY 01/06/2007  . COLONIC POLYPS, HX OF 01/06/2007  . NECK PAIN 02/15/2007  . Other symptoms involving cardiovascular system 09/20/2009  . Cough   . Nasal congestion   . Rash october 2012  covering majority of pts body   . Pneumonia 1985    02/05/2011-most recent  . OSTEOARTHRITIS 01/31/2009  . MRSA nasal colonization     tested postive before surgary Jan 2013   Past Surgical History  Procedure Laterality Date  . Hernia repair  36    age 61-rupture right inguinal hernia  . Inguinal hernia repair  03/02/2011    Procedure: HERNIA REPAIR INGUINAL ADULT;  Surgeon: Earnstine Regal, MD;  Location: [facility];  Service: General;  Laterality: Left;  with mesh     reports that he  quit smoking about 35 years ago. His smoking use included Cigarettes. He has a 34.5 pack-year smoking history. He has never used smokeless tobacco. He reports that he drinks about 0.6 oz of alcohol per week. He reports that he does not use illicit drugs. family history includes Cancer in his brother and sister; Heart disease in his father and mother. Allergies  Allergen Reactions  . Sulfonamide Derivatives Swelling  . Amoxicillin Rash  . Ciprofloxacin Rash    REACTION: unspecified  . Doxycycline Hyclate Rash    REACTION: unspecified  . Levofloxacin Rash    REACTION: unspecified  chx   Review of Systems  Constitutional: Negative for fever, chills, activity change, appetite change and fatigue.  HENT: Negative for congestion, dental problem, ear pain, hearing loss, mouth sores, rhinorrhea, sinus pressure, sneezing, tinnitus, trouble swallowing and voice change.   Eyes: Negative for photophobia, pain, redness and visual disturbance.  Respiratory: Negative for apnea, cough, choking, chest tightness, shortness of breath and wheezing.   Cardiovascular: Negative for chest pain, palpitations and leg swelling.  Gastrointestinal: Negative for nausea, vomiting, abdominal pain, diarrhea, constipation, blood in stool, abdominal distention, anal bleeding and rectal pain.  Genitourinary: Negative for dysuria, urgency, frequency, hematuria, flank pain, decreased urine volume, discharge, penile swelling, scrotal swelling, difficulty urinating, genital sores and testicular pain.  Musculoskeletal: Negative for myalgias, back pain, joint swelling, arthralgias, gait problem, neck pain and neck stiffness.  Skin: Positive for rash. Negative for color change and wound.       Followed by dermatology for lichen planus  Neurological: Negative for dizziness, tremors, seizures, syncope, facial asymmetry, speech difficulty, weakness, light-headedness, numbness and headaches.  Hematological: Negative for adenopathy. Does  not bruise/bleed easily.  Psychiatric/Behavioral: Negative for suicidal ideas, hallucinations, behavioral problems, confusion, sleep disturbance, self-injury, dysphoric mood, decreased concentration and agitation. The patient is not nervous/anxious.        Objective:   Physical Exam  Constitutional: He is oriented to person, place, and time. He appears well-developed and well-nourished.  HENT:  Head: Normocephalic and atraumatic.  Right Ear: External ear normal.  Left Ear: External ear normal.  Nose: Nose normal.  Mouth/Throat: Oropharynx is clear and moist.  Eyes: Conjunctivae and EOM are normal. Pupils are equal, round, and reactive to light. No scleral icterus.  Neck: Normal range of motion. Neck supple. No JVD present. No thyromegaly present.  Cardiovascular: Normal rate, regular rhythm, normal heart sounds and intact distal pulses.  Exam reveals no gallop and no friction rub.   No murmur heard. Pulses are faint.  Posterior tibial pulses are full Faint right femoral bruit  Pulmonary/Chest: Effort normal and breath sounds normal. He exhibits no tenderness.  Abdominal: Soft. Bowel sounds are normal. He exhibits no distension and no mass. There is no tenderness.  Genitourinary: Prostate normal and penis normal.  Prostate +3 enlarged (2014)  External hemorrhoidal tags (2014)  Exam deferred 2015 due to urology referral  Musculoskeletal: Normal range of motion. He exhibits no edema or tenderness.  Lymphadenopathy:    He has no cervical adenopathy.  Neurological: He is alert and oriented to person, place, and time. He has normal reflexes. No cranial nerve deficit. Coordination normal.  Left patellar reflex absent  Skin: Skin is warm and dry. No rash noted.  Psychiatric: He has a normal mood and affect. His behavior is normal.          Assessment & Plan:   Annual clinical exam BPH   Osteoarthritis History of colonic polyps; status post colonoscopy February 2016  Heart  healthy diet,  regular exercise encouraged. Will reassess in one year or as needed We'll check updated lab

## 2014-12-04 NOTE — Patient Instructions (Addendum)
It is important that you exercise regularly, at least 20 minutes 3 to 4 times per week.  If you develop chest pain or shortness of breath seek  medical attention.  Return in one year for follow-up  Health Maintenance A healthy lifestyle and preventative care can promote health and wellness.  Maintain regular health, dental, and eye exams.  Eat a healthy diet. Foods like vegetables, fruits, whole grains, low-fat dairy products, and lean protein foods contain the nutrients you need and are low in calories. Decrease your intake of foods high in solid fats, added sugars, and salt. Get information about a proper diet from your health care provider, if necessary.  Regular physical exercise is one of the most important things you can do for your health. Most adults should get at least 150 minutes of moderate-intensity exercise (any activity that increases your heart rate and causes you to sweat) each week. In addition, most adults need muscle-strengthening exercises on 2 or more days a week.   Maintain a healthy weight. The body mass index (BMI) is a screening tool to identify possible weight problems. It provides an estimate of body fat based on height and weight. Your health care provider can find your BMI and can help you achieve or maintain a healthy weight. For males 20 years and older:  A BMI below 18.5 is considered underweight.  A BMI of 18.5 to 24.9 is normal.  A BMI of 25 to 29.9 is considered overweight.  A BMI of 30 and above is considered obese.  Maintain normal blood lipids and cholesterol by exercising and minimizing your intake of saturated fat. Eat a balanced diet with plenty of fruits and vegetables. Blood tests for lipids and cholesterol should begin at age 5 and be repeated every 5 years. If your lipid or cholesterol levels are high, you are over age 13, or you are at high risk for heart disease, you may need your cholesterol levels checked more frequently.Ongoing high lipid  and cholesterol levels should be treated with medicines if diet and exercise are not working.  If you smoke, find out from your health care provider how to quit. If you do not use tobacco, do not start.  Lung cancer screening is recommended for adults aged 50-80 years who are at high risk for developing lung cancer because of a history of smoking. A yearly low-dose CT scan of the lungs is recommended for people who have at least a 30-pack-year history of smoking and are current smokers or have quit within the past 15 years. A pack year of smoking is smoking an average of 1 pack of cigarettes a day for 1 year (for example, a 30-pack-year history of smoking could mean smoking 1 pack a day for 30 years or 2 packs a day for 15 years). Yearly screening should continue until the smoker has stopped smoking for at least 15 years. Yearly screening should be stopped for people who develop a health problem that would prevent them from having lung cancer treatment.  If you choose to drink alcohol, do not have more than 2 drinks per day. One drink is considered to be 12 oz (360 mL) of beer, 5 oz (150 mL) of wine, or 1.5 oz (45 mL) of liquor.  Avoid the use of street drugs. Do not share needles with anyone. Ask for help if you need support or instructions about stopping the use of drugs.  High blood pressure causes heart disease and increases the risk of stroke. Blood  pressure should be checked at least every 1-2 years. Ongoing high blood pressure should be treated with medicines if weight loss and exercise are not effective.  If you are 59-44 years old, ask your health care provider if you should take aspirin to prevent heart disease.  Diabetes screening involves taking a blood sample to check your fasting blood sugar level. This should be done once every 3 years after age 58 if you are at a normal weight and without risk factors for diabetes. Testing should be considered at a younger age or be carried out more  frequently if you are overweight and have at least 1 risk factor for diabetes.  Colorectal cancer can be detected and often prevented. Most routine colorectal cancer screening begins at the age of 51 and continues through age 49. However, your health care provider may recommend screening at an earlier age if you have risk factors for colon cancer. On a yearly basis, your health care provider may provide home test kits to check for hidden blood in the stool. A small camera at the end of a tube may be used to directly examine the colon (sigmoidoscopy or colonoscopy) to detect the earliest forms of colorectal cancer. Talk to your health care provider about this at age 47 when routine screening begins. A direct exam of the colon should be repeated every 5-10 years through age 76, unless early forms of precancerous polyps or small growths are found.  People who are at an increased risk for hepatitis B should be screened for this virus. You are considered at high risk for hepatitis B if:  You were born in a country where hepatitis B occurs often. Talk with your health care provider about which countries are considered high risk.  Your parents were born in a high-risk country and you have not received a shot to protect against hepatitis B (hepatitis B vaccine).  You have HIV or AIDS.  You use needles to inject street drugs.  You live with, or have sex with, someone who has hepatitis B.  You are a man who has sex with other men (MSM).  You get hemodialysis treatment.  You take certain medicines for conditions like cancer, organ transplantation, and autoimmune conditions.  Hepatitis C blood testing is recommended for all people born from [birth year] through 1963 and any individual with known risk factors for hepatitis C.  Healthy men should no longer receive prostate-specific antigen (PSA) blood tests as part of routine cancer screening. Talk to your health care provider about prostate cancer  screening.  Testicular cancer screening is not recommended for adolescents or adult males who have no symptoms. Screening includes self-exam, a health care provider exam, and other screening tests. Consult with your health care provider about any symptoms you have or any concerns you have about testicular cancer.  Practice safe sex. Use condoms and avoid high-risk sexual practices to reduce the spread of sexually transmitted infections (STIs).  You should be screened for STIs, including gonorrhea and chlamydia if:  You are sexually active and are younger than 24 years.  You are older than 24 years, and your health care provider tells you that you are at risk for this type of infection.  Your sexual activity has changed since you were last screened, and you are at an increased risk for chlamydia or gonorrhea. Ask your health care provider if you are at risk.  If you are at risk of being infected with HIV, it is recommended that you take  a prescription medicine daily to prevent HIV infection. This is called pre-exposure prophylaxis (PrEP). You are considered at risk if:  You are a man who has sex with other men (MSM).  You are a heterosexual man who is sexually active with multiple partners.  You take drugs by injection.  You are sexually active with a partner who has HIV.  Talk with your health care provider about whether you are at high risk of being infected with HIV. If you choose to begin PrEP, you should first be tested for HIV. You should then be tested every 3 months for as long as you are taking PrEP.  Use sunscreen. Apply sunscreen liberally and repeatedly throughout the day. You should seek shade when your shadow is shorter than you. Protect yourself by wearing long sleeves, pants, a wide-brimmed hat, and sunglasses year round whenever you are outdoors.  Tell your health care provider of new moles or changes in moles, especially if there is a change in shape or color. Also, tell  your health care provider if a mole is larger than the size of a pencil eraser.  A one-time screening for abdominal aortic aneurysm (AAA) and surgical repair of large AAAs by ultrasound is recommended for men aged 54-75 years who are current or former smokers.  Stay current with your vaccines (immunizations). Document Released: 09/19/2007 Document Revised: 03/28/2013 Document Reviewed: 08/18/2010 [facility] Patient Information 2015 [place], [place]. This information is not intended to replace advice given to you by your health care provider. Make sure you discuss any questions you have with your health care provider.

## 2014-12-04 NOTE — Progress Notes (Signed)
Pre visit review using our clinic review tool, if applicable. No additional management support is needed unless otherwise documented below in the visit note. 

## 2014-12-05 LAB — LIPID PANEL
CHOLESTEROL: 174 mg/dL (ref 0–200)
HDL: 66.5 mg/dL (ref >39.00)
LDL Cholesterol: 97 mg/dL (ref 0–99)
NonHDL: 107.24
Total CHOL/HDL Ratio: 3
Triglycerides: 50 mg/dL (ref 0.0–149.0)
VLDL: 10 mg/dL (ref 0.0–40.0)

## 2014-12-05 LAB — CBC WITH DIFFERENTIAL/PLATELET
BASOS ABS: 0 10*3/uL (ref 0.0–0.1)
BASOS PCT: 0.5 % (ref 0.0–3.0)
EOS PCT: 4.3 % (ref 0.0–5.0)
Eosinophils Absolute: 0.3 10*3/uL (ref 0.0–0.7)
HEMATOCRIT: 41.3 % (ref 39.0–52.0)
Hemoglobin: 13.9 g/dL (ref 13.0–17.0)
LYMPHS PCT: 26.7 % (ref 12.0–46.0)
Lymphs Abs: 1.7 10*3/uL (ref 0.7–4.0)
MCHC: 33.6 g/dL (ref 30.0–36.0)
MCV: 91.8 fl (ref 78.0–100.0)
MONOS PCT: 6.3 % (ref 3.0–12.0)
Monocytes Absolute: 0.4 10*3/uL (ref 0.1–1.0)
NEUTROS ABS: 4 10*3/uL (ref 1.4–7.7)
Neutrophils Relative %: 62.2 % (ref 43.0–77.0)
PLATELETS: 200 10*3/uL (ref 150.0–400.0)
RBC: 4.5 Mil/uL (ref 4.22–5.81)
RDW: 16.2 % — ABNORMAL HIGH (ref 11.5–15.5)
WBC: 6.5 10*3/uL (ref 4.0–10.5)

## 2014-12-05 LAB — POCT URINALYSIS DIPSTICK
BILIRUBIN UA: NEGATIVE
GLUCOSE UA: NEGATIVE
Ketones, UA: NEGATIVE
Leukocytes, UA: NEGATIVE
NITRITE UA: NEGATIVE
Protein, UA: NEGATIVE
Spec Grav, UA: 1.015
Urobilinogen, UA: 0.2
pH, UA: 7

## 2014-12-05 LAB — COMPREHENSIVE METABOLIC PANEL
ALBUMIN: 4 g/dL (ref 3.5–5.2)
ALT: 12 U/L (ref 0–53)
AST: 20 U/L (ref 0–37)
Alkaline Phosphatase: 68 U/L (ref 39–117)
BUN: 16 mg/dL (ref 6–23)
CO2: 29 meq/L (ref 19–32)
Calcium: 9.8 mg/dL (ref 8.4–10.5)
Chloride: 105 mEq/L (ref 96–112)
Creatinine, Ser: 0.8 mg/dL (ref 0.40–1.50)
GFR: 98.06 mL/min (ref >60.00)
Glucose, Bld: 94 mg/dL (ref 70–99)
Potassium: 5.1 mEq/L (ref 3.5–5.1)
SODIUM: 140 meq/L (ref 135–145)
TOTAL PROTEIN: 6.5 g/dL (ref 6.0–8.3)
Total Bilirubin: 0.6 mg/dL (ref 0.2–1.2)

## 2014-12-05 LAB — TSH: TSH: 4.45 u[IU]/mL (ref 0.35–4.50)

## 2015-01-23 ENCOUNTER — Ambulatory Visit (INDEPENDENT_AMBULATORY_CARE_PROVIDER_SITE_OTHER): Payer: Medicare Other | Admitting: Family Medicine

## 2015-01-23 ENCOUNTER — Encounter: Payer: Self-pay | Admitting: Family Medicine

## 2015-01-23 VITALS — BP 118/73 | HR 77 | Temp 98.7°F | Ht 66.0 in | Wt 150.0 lb

## 2015-01-23 DIAGNOSIS — R319 Hematuria, unspecified: Secondary | ICD-10-CM | POA: Diagnosis not present

## 2015-01-23 LAB — POCT URINALYSIS DIPSTICK
BILIRUBIN UA: NEGATIVE
GLUCOSE UA: NEGATIVE
KETONES UA: NEGATIVE
Leukocytes, UA: NEGATIVE
NITRITE UA: NEGATIVE
SPEC GRAV UA: 1.015
Urobilinogen, UA: 0.2
pH, UA: 6.5

## 2015-01-23 NOTE — Progress Notes (Signed)
Pre visit review using our clinic review tool, if applicable. No additional management support is needed unless otherwise documented below in the visit note. 

## 2015-01-23 NOTE — Progress Notes (Signed)
   Subjective:    Patient ID: Benjamin Cardenas, male    DOB: 12-08-1929, 81 y.o.   MRN: [id number]  HPI Here for 3 weeks of intermittent blood in the urine. He as seen this consistently over the past 3 days. There is no pressure or pain, no testicular pain, no urgency to urinate and no burning. He has a hx of hemospermia but never hematuria. He had seen Dr. Karsten Ro once a year to follow BPH, but he last saw him in June 2015.    Review of Systems  Constitutional: Negative.   Respiratory: Negative.   Cardiovascular: Negative.   Gastrointestinal: Negative.   Genitourinary: Positive for hematuria. Negative for dysuria, urgency, frequency, flank pain, difficulty urinating and testicular pain.       Objective:   Physical Exam  Constitutional: He appears well-developed and well-nourished. No distress.  Cardiovascular: Normal rate, regular rhythm, normal heart sounds and intact distal pulses.   Pulmonary/Chest: Effort normal and breath sounds normal.  Abdominal: Soft. Bowel sounds are normal. He exhibits no distension and no mass. There is no tenderness. There is no rebound and no guarding.  Genitourinary: Rectum normal.  Prostate is moderately enlarged but is not boggy or tender. No testicular tenderness.           Assessment & Plan:  Hematuria in a former smoker. We will refer him to Dr. Karsten Ro for further evaluation. He does not seem to have an acute infection but we will culture his sample.

## 2015-01-23 NOTE — Addendum Note (Signed)
Addended by: Aggie Hacker A on: 01/23/2015 03:29 PM   Modules accepted: Orders

## 2015-01-25 LAB — URINE CULTURE
COLONY COUNT: NO GROWTH
ORGANISM ID, BACTERIA: NO GROWTH

## 2015-01-29 DIAGNOSIS — R972 Elevated prostate specific antigen [PSA]: Secondary | ICD-10-CM | POA: Diagnosis not present

## 2015-01-29 DIAGNOSIS — R310 Gross hematuria: Secondary | ICD-10-CM | POA: Diagnosis not present

## 2015-01-30 ENCOUNTER — Encounter: Payer: Self-pay | Admitting: Internal Medicine

## 2015-02-04 ENCOUNTER — Ambulatory Visit (INDEPENDENT_AMBULATORY_CARE_PROVIDER_SITE_OTHER): Payer: Medicare Other | Admitting: *Deleted

## 2015-02-04 DIAGNOSIS — Z23 Encounter for immunization: Secondary | ICD-10-CM

## 2015-02-05 DIAGNOSIS — R310 Gross hematuria: Secondary | ICD-10-CM | POA: Diagnosis not present

## 2015-02-05 DIAGNOSIS — R3129 Other microscopic hematuria: Secondary | ICD-10-CM | POA: Diagnosis not present

## 2015-02-13 ENCOUNTER — Encounter: Payer: Self-pay | Admitting: Internal Medicine

## 2015-02-14 DIAGNOSIS — D494 Neoplasm of unspecified behavior of bladder: Secondary | ICD-10-CM | POA: Diagnosis not present

## 2015-02-14 DIAGNOSIS — N1330 Unspecified hydronephrosis: Secondary | ICD-10-CM | POA: Diagnosis not present

## 2015-02-14 DIAGNOSIS — R972 Elevated prostate specific antigen [PSA]: Secondary | ICD-10-CM | POA: Diagnosis not present

## 2015-02-14 DIAGNOSIS — R310 Gross hematuria: Secondary | ICD-10-CM | POA: Diagnosis not present

## 2015-02-15 ENCOUNTER — Other Ambulatory Visit: Payer: Self-pay | Admitting: Urology

## 2015-02-18 ENCOUNTER — Other Ambulatory Visit: Payer: Self-pay | Admitting: Urology

## 2015-02-18 ENCOUNTER — Encounter (HOSPITAL_BASED_OUTPATIENT_CLINIC_OR_DEPARTMENT_OTHER): Payer: Self-pay | Admitting: *Deleted

## 2015-02-19 ENCOUNTER — Encounter (HOSPITAL_BASED_OUTPATIENT_CLINIC_OR_DEPARTMENT_OTHER): Payer: Self-pay | Admitting: *Deleted

## 2015-02-19 NOTE — Progress Notes (Addendum)
NPO AFTER MN.  ARRIVE AT 0730.  NEEDS  ISTAT 8.

## 2015-02-25 ENCOUNTER — Ambulatory Visit (HOSPITAL_BASED_OUTPATIENT_CLINIC_OR_DEPARTMENT_OTHER)
Admission: RE | Admit: 2015-02-25 | Discharge: 2015-02-25 | Disposition: A | Discharge status: Home / Self Care | Payer: Medicare Other | Source: Ambulatory Visit | Attending: Urology | Admitting: Urology

## 2015-02-25 ENCOUNTER — Ambulatory Visit (HOSPITAL_BASED_OUTPATIENT_CLINIC_OR_DEPARTMENT_OTHER): Payer: Medicare Other | Admitting: Anesthesiology

## 2015-02-25 ENCOUNTER — Encounter (HOSPITAL_BASED_OUTPATIENT_CLINIC_OR_DEPARTMENT_OTHER): Payer: Self-pay | Admitting: Anesthesiology

## 2015-02-25 ENCOUNTER — Encounter (HOSPITAL_BASED_OUTPATIENT_CLINIC_OR_DEPARTMENT_OTHER)
Admission: RE | Disposition: A | Discharge status: Home / Self Care | Payer: Self-pay | Source: Ambulatory Visit | Attending: Urology

## 2015-02-25 DIAGNOSIS — Z87891 Personal history of nicotine dependence: Secondary | ICD-10-CM | POA: Diagnosis not present

## 2015-02-25 DIAGNOSIS — I722 Aneurysm of renal artery: Secondary | ICD-10-CM | POA: Diagnosis not present

## 2015-02-25 DIAGNOSIS — N401 Enlarged prostate with lower urinary tract symptoms: Secondary | ICD-10-CM | POA: Insufficient documentation

## 2015-02-25 DIAGNOSIS — N138 Other obstructive and reflux uropathy: Secondary | ICD-10-CM | POA: Diagnosis not present

## 2015-02-25 DIAGNOSIS — R310 Gross hematuria: Secondary | ICD-10-CM | POA: Insufficient documentation

## 2015-02-25 DIAGNOSIS — C678 Malignant neoplasm of overlapping sites of bladder: Secondary | ICD-10-CM | POA: Diagnosis not present

## 2015-02-25 DIAGNOSIS — Z7982 Long term (current) use of aspirin: Secondary | ICD-10-CM | POA: Diagnosis not present

## 2015-02-25 DIAGNOSIS — N3289 Other specified disorders of bladder: Secondary | ICD-10-CM | POA: Diagnosis not present

## 2015-02-25 DIAGNOSIS — Z79899 Other long term (current) drug therapy: Secondary | ICD-10-CM | POA: Diagnosis not present

## 2015-02-25 DIAGNOSIS — N1330 Unspecified hydronephrosis: Secondary | ICD-10-CM | POA: Diagnosis not present

## 2015-02-25 DIAGNOSIS — C674 Malignant neoplasm of posterior wall of bladder: Secondary | ICD-10-CM | POA: Insufficient documentation

## 2015-02-25 DIAGNOSIS — D494 Neoplasm of unspecified behavior of bladder: Secondary | ICD-10-CM

## 2015-02-25 DIAGNOSIS — C679 Malignant neoplasm of bladder, unspecified: Secondary | ICD-10-CM | POA: Diagnosis not present

## 2015-02-25 HISTORY — DX: Unspecified osteoarthritis, unspecified site: M19.90

## 2015-02-25 HISTORY — PX: TRANSURETHRAL RESECTION OF BLADDER TUMOR WITH GYRUS (TURBT-GYRUS): SHX6458

## 2015-02-25 HISTORY — DX: Personal history of colonic polyps: Z86.010

## 2015-02-25 HISTORY — DX: Unspecified symptoms and signs involving the genitourinary system: R39.9

## 2015-02-25 HISTORY — DX: Unspecified hydronephrosis: N13.30

## 2015-02-25 HISTORY — DX: Presence of spectacles and contact lenses: Z97.3

## 2015-02-25 HISTORY — DX: Personal history of colon polyps, unspecified: Z86.0100

## 2015-02-25 HISTORY — DX: Other seasonal allergic rhinitis: J30.2

## 2015-02-25 HISTORY — PX: CYSTOSCOPY WITH RETROGRADE PYELOGRAM, URETEROSCOPY AND STENT PLACEMENT: SHX5789

## 2015-02-25 HISTORY — DX: Xerosis cutis: L85.3

## 2015-02-25 LAB — POCT I-STAT, CHEM 8
BUN: 13 mg/dL (ref 6–20)
Calcium, Ion: 1.3 mmol/L (ref 1.13–1.30)
Chloride: 102 mmol/L (ref 101–111)
Creatinine, Ser: 0.8 mg/dL (ref 0.61–1.24)
Glucose, Bld: 85 mg/dL (ref 65–99)
HCT: 47 % (ref 39.0–52.0)
Hemoglobin: 16 g/dL (ref 13.0–17.0)
Potassium: 3.7 mmol/L (ref 3.5–5.1)
Sodium: 141 mmol/L (ref 135–145)
TCO2: 26 mmol/L (ref 0–100)

## 2015-02-25 SURGERY — TRANSURETHRAL RESECTION OF BLADDER TUMOR WITH GYRUS (TURBT-GYRUS)
Anesthesia: General

## 2015-02-25 MED ORDER — FENTANYL CITRATE (PF) 100 MCG/2ML IJ SOLN
INTRAMUSCULAR | Status: AC
  Filled 2015-02-25: qty 2

## 2015-02-25 MED ORDER — LIDOCAINE HCL (CARDIAC) 20 MG/ML IV SOLN
INTRAVENOUS | Status: AC
  Filled 2015-02-25: qty 5

## 2015-02-25 MED ORDER — PROPOFOL 10 MG/ML IV BOLUS
INTRAVENOUS | Status: AC
  Filled 2015-02-25: qty 20

## 2015-02-25 MED ORDER — CEPHALEXIN 250 MG PO CAPS: 250.0000 mg | ORAL_CAPSULE | Freq: Two times a day (BID) | ORAL | Status: DC

## 2015-02-25 MED ORDER — PROPOFOL 10 MG/ML IV BOLUS
INTRAVENOUS | Status: DC | PRN
  Administered 2015-02-25: 150 mg via INTRAVENOUS

## 2015-02-25 MED ORDER — OXYCODONE HCL 5 MG PO TABS
5.0000 mg | ORAL_TABLET | Freq: Once | ORAL | Status: AC
  Administered 2015-02-25: 5 mg via ORAL
  Filled 2015-02-25: qty 1

## 2015-02-25 MED ORDER — ONDANSETRON HCL 4 MG/2ML IJ SOLN
INTRAMUSCULAR | Status: AC
  Filled 2015-02-25: qty 2

## 2015-02-25 MED ORDER — GENTAMICIN SULFATE 40 MG/ML IJ SOLN
320.0000 mg | Freq: Once | INTRAVENOUS | Status: AC
  Administered 2015-02-25: 320 mg via INTRAVENOUS
  Filled 2015-02-25: qty 8

## 2015-02-25 MED ORDER — VANCOMYCIN HCL IN DEXTROSE 1-5 GM/200ML-% IV SOLN
1000.0000 mg | Freq: Once | INTRAVENOUS | Status: AC
  Administered 2015-02-25: 1000 mg via INTRAVENOUS
  Filled 2015-02-25 (×2): qty 200

## 2015-02-25 MED ORDER — FENTANYL CITRATE (PF) 100 MCG/2ML IJ SOLN
25.0000 ug | INTRAMUSCULAR | Status: DC | PRN
  Administered 2015-02-25: 25 ug via INTRAVENOUS
  Filled 2015-02-25: qty 1

## 2015-02-25 MED ORDER — IOHEXOL 350 MG/ML SOLN
INTRAVENOUS | Status: DC | PRN
  Administered 2015-02-25: 10 mL via URETHRAL

## 2015-02-25 MED ORDER — KETOROLAC TROMETHAMINE 30 MG/ML IJ SOLN
15.0000 mg | Freq: Once | INTRAMUSCULAR | Status: DC
  Filled 2015-02-25: qty 1

## 2015-02-25 MED ORDER — HYDROCODONE-ACETAMINOPHEN 10-325 MG PO TABS: 1.0000 | ORAL_TABLET | ORAL | Status: DC | PRN

## 2015-02-25 MED ORDER — GENTAMICIN SULFATE 40 MG/ML IJ SOLN
5.0000 mg/kg | Freq: Once | INTRAVENOUS | Status: DC
  Filled 2015-02-25: qty 8.5

## 2015-02-25 MED ORDER — ONDANSETRON HCL 4 MG/2ML IJ SOLN
INTRAMUSCULAR | Status: DC | PRN
  Administered 2015-02-25: 4 mg via INTRAVENOUS

## 2015-02-25 MED ORDER — LACTATED RINGERS IV SOLN
INTRAVENOUS | Status: DC
  Administered 2015-02-25: 08:00:00 via INTRAVENOUS
  Filled 2015-02-25: qty 1000

## 2015-02-25 MED ORDER — ACETAMINOPHEN 10 MG/ML IV SOLN
INTRAVENOUS | Status: DC | PRN
  Administered 2015-02-25: 1000 mg via INTRAVENOUS

## 2015-02-25 MED ORDER — MITOMYCIN CHEMO FOR BLADDER INSTILLATION 40 MG
40.0000 mg | Freq: Once | INTRAVENOUS | Status: AC
  Administered 2015-02-25: 40 mg via INTRAVESICAL
  Filled 2015-02-25: qty 40

## 2015-02-25 MED ORDER — FENTANYL CITRATE (PF) 100 MCG/2ML IJ SOLN
INTRAMUSCULAR | Status: DC | PRN
  Administered 2015-02-25 (×8): 12.5 ug via INTRAVENOUS

## 2015-02-25 MED ORDER — OXYCODONE HCL 5 MG PO TABS
ORAL_TABLET | ORAL | Status: AC
  Filled 2015-02-25: qty 1

## 2015-02-25 MED ORDER — LIDOCAINE HCL (CARDIAC) 20 MG/ML IV SOLN
INTRAVENOUS | Status: DC | PRN
  Administered 2015-02-25: 40 mg via INTRAVENOUS

## 2015-02-25 MED ORDER — PROMETHAZINE HCL 25 MG/ML IJ SOLN
6.2500 mg | INTRAMUSCULAR | Status: DC | PRN
  Filled 2015-02-25: qty 1

## 2015-02-25 MED ORDER — ACETAMINOPHEN 10 MG/ML IV SOLN
INTRAVENOUS | Status: AC
  Filled 2015-02-25: qty 100

## 2015-02-25 SURGICAL SUPPLY — 54 items
ADAPTER CATH URET PLST 4-6FR (CATHETERS) IMPLANT
ADPR CATH URET STRL DISP 4-6FR (CATHETERS)
BAG DRN ANRFLXCHMBR STRAP LEK (BAG)
BAG URINE DRAINAGE (UROLOGICAL SUPPLIES) IMPLANT
BAG URINE LEG 19OZ MD ST LTX (BAG) IMPLANT
BASKET LASER NITINOL 1.9FR (BASKET) IMPLANT
BASKET STNLS GEMINI 4WIRE 3FR (BASKET) IMPLANT
BASKET ZERO TIP NITINOL 2.4FR (BASKET) IMPLANT
BSKT STON RTRVL 120 1.9FR (BASKET)
BSKT STON RTRVL GEM 120X11 3FR (BASKET)
BSKT STON RTRVL ZERO TP 2.4FR (BASKET)
CATH CLEAR GEL 3F BACKSTOP (CATHETERS) IMPLANT
CATH FOLEY 3WAY 30CC 24FR (CATHETERS) ×3
CATH HEMA 3WAY 30CC 24FR COUDE (CATHETERS) IMPLANT
CATH HEMA 3WAY 30CC 24FR RND (CATHETERS) IMPLANT
CATH INTERMIT  6FR 70CM (CATHETERS) IMPLANT
CATH URET 5FR 28IN CONE TIP (BALLOONS)
CATH URET 5FR 70CM CONE TIP (BALLOONS) IMPLANT
CATH URTH STD 24FR FL 3W 2 (CATHETERS) ×2 IMPLANT
CLOTH BEACON ORANGE TIMEOUT ST (SAFETY) ×3 IMPLANT
ELECT BIVAP BIPO 22/24 DONUT (ELECTROSURGICAL)
ELECT BUTTON BIOP 24F 90D PLAS (MISCELLANEOUS) IMPLANT
ELECT REM PT RETURN 9FT ADLT (ELECTROSURGICAL) ×3
ELECTRD BIVAP BIPO 22/24 DONUT (ELECTROSURGICAL) ×2 IMPLANT
ELECTRODE REM PT RTRN 9FT ADLT (ELECTROSURGICAL) ×2 IMPLANT
EVACUATOR MICROVAS BLADDER (UROLOGICAL SUPPLIES) ×1 IMPLANT
FIBER LASER FLEXIVA 365 (UROLOGICAL SUPPLIES) IMPLANT
FIBER LASER FLEXIVA 550 (UROLOGICAL SUPPLIES) IMPLANT
FIBER LASER TRAC TIP (UROLOGICAL SUPPLIES) IMPLANT
GLOVE BIO SURGEON STRL SZ8 (GLOVE) ×3 IMPLANT
GOWN STRL REUS W/ TWL LRG LVL3 (GOWN DISPOSABLE) ×2 IMPLANT
GOWN STRL REUS W/ TWL XL LVL3 (GOWN DISPOSABLE) ×2 IMPLANT
GOWN STRL REUS W/TWL LRG LVL3 (GOWN DISPOSABLE) ×3
GOWN STRL REUS W/TWL XL LVL3 (GOWN DISPOSABLE) ×3
GUIDEWIRE 0.038 PTFE COATED (WIRE) IMPLANT
GUIDEWIRE ANG ZIPWIRE 038X150 (WIRE) IMPLANT
GUIDEWIRE STR DUAL SENSOR (WIRE) ×3 IMPLANT
HOLDER FOLEY CATH W/STRAP (MISCELLANEOUS) IMPLANT
IV NS IRRIG 3000ML ARTHROMATIC (IV SOLUTION) ×6 IMPLANT
KIT BALLIN UROMAX 15FX10 (LABEL) IMPLANT
KIT BALLN UROMAX 15FX4 (MISCELLANEOUS) IMPLANT
KIT BALLN UROMAX 26 75X4 (MISCELLANEOUS)
KIT ROOM TURNOVER WOR (KITS) ×3 IMPLANT
LOOP CUT BIPOLAR 24F LRG (ELECTROSURGICAL) ×3 IMPLANT
MANIFOLD NEPTUNE II (INSTRUMENTS) IMPLANT
PACK CYSTO (CUSTOM PROCEDURE TRAY) ×3 IMPLANT
PLUG CATH AND CAP STER (CATHETERS) IMPLANT
SET ASPIRATION TUBING (TUBING) ×3 IMPLANT
SET HIGH PRES BAL DIL (LABEL)
SHEATH ACCESS URETERAL 38CM (SHEATH) IMPLANT
SYR 30ML LL (SYRINGE) IMPLANT
SYRINGE IRR TOOMEY STRL 70CC (SYRINGE) IMPLANT
TUBE CONNECTING 12X1/4 (SUCTIONS) IMPLANT
WATER STERILE IRR 3000ML UROMA (IV SOLUTION) IMPLANT

## 2015-02-25 NOTE — Anesthesia Preprocedure Evaluation (Signed)
Anesthesia Evaluation  Patient identified by MRN, date of birth, ID band Patient awake    Reviewed: Allergy & Precautions, NPO status , Patient's Chart, lab work & pertinent test results  Airway Mallampati: II  TM Distance: >3 FB Neck ROM: Full    Dental no notable dental hx.    Pulmonary neg pulmonary ROS, former smoker,    Pulmonary exam normal breath sounds clear to auscultation       Cardiovascular negative cardio ROS Normal cardiovascular exam Rhythm:Regular Rate:Normal     Neuro/Psych negative neurological ROS  negative psych ROS   GI/Hepatic negative GI ROS, Neg liver ROS,   Endo/Other  negative endocrine ROS  Renal/GU negative Renal ROS  negative genitourinary   Musculoskeletal negative musculoskeletal ROS (+)   Abdominal   Peds negative pediatric ROS (+)  Hematology negative hematology ROS (+)   Anesthesia Other Findings   Reproductive/Obstetrics negative OB ROS                             Anesthesia Physical Anesthesia Plan  ASA: I  Anesthesia Plan: General   Post-op Pain Management:    Induction: Intravenous  Airway Management Planned: LMA and Oral ETT  Additional Equipment:   Intra-op Plan:   Post-operative Plan: Extubation in OR  Informed Consent: I have reviewed the patients History and Physical, chart, labs and discussed the procedure including the risks, benefits and alternatives for the proposed anesthesia with the patient or authorized representative who has indicated his/her understanding and acceptance.   Dental advisory given  Plan Discussed with: CRNA and Surgeon  Anesthesia Plan Comments:         Anesthesia Quick Evaluation

## 2015-02-25 NOTE — Anesthesia Procedure Notes (Signed)
Procedure Name: LMA Insertion Date/Time: 02/25/2015 9:36 AM Performed by: Justice Rocher Pre-anesthesia Checklist: Patient identified, Emergency Drugs available, Suction available and Patient being monitored Patient Re-evaluated:Patient Re-evaluated prior to inductionOxygen Delivery Method: Circle System Utilized Preoxygenation: Pre-oxygenation with 100% oxygen Intubation Type: IV induction Ventilation: Mask ventilation without difficulty LMA: LMA inserted LMA Size: 5.0 Number of attempts: 1 Airway Equipment and Method: Bite block Placement Confirmation: positive ETCO2 Tube secured with: Tape Dental Injury: Teeth and Oropharynx as per pre-operative assessment

## 2015-02-25 NOTE — Anesthesia Postprocedure Evaluation (Signed)
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

## 2015-02-25 NOTE — Op Note (Signed)
PATIENT:  Benjamin Cardenas  PRE-OPERATIVE DIAGNOSIS: 1. Bladder tumor 2. Left hydronephrosis  POST-OPERATIVE DIAGNOSIS: Same  PROCEDURE:  Procedure(s): 1. TRANSURETHRAL RESECTION OF BLADDER TUMOR (TURBT) (4.5cm.) 2. Left retrograde pyelogram with interpretation 3. Left diagnostic ureteroscopy  4. Instillation of intravesical chemotherapy (mitomycin-C)  SURGEON:  Surgeon(s): Claybon Jabs  ANESTHESIA:   General  EBL:  Minimal  DRAINS: Urinary Catheter (20 Fr. Foley)   SPECIMEN:  Source of Specimen:  Bladder tumor  DISPOSITION OF SPECIMEN:  PATHOLOGY  Indication: Benjamin Cardenas is an 81 year old male who experienced gross hematuria. He has a history of cigarette smoking. He was evaluated with a CT scan which revealed no significant abnormality of the upper tract but he did have some dilatation of his left ureter down to the ureterovesical junction with no stone identified. The bladder was thick walled and there was a mass noted on the posterior wall of the bladder on the right-hand side. This was confirmed cystoscopically to be a papillary bladder tumor. He is brought to the operating room today for further evaluation of his left hydronephrosis and resection of his bladder tumor.  Description of operation: The patient was taken to the operating room and administered general anesthesia. They were then placed on the table and moved to the dorsal lithotomy position after which the genitalia was sterilely prepped and draped. An official timeout was then performed.  I initially passed the 9 French cystoscope down the urethra under direct vision with the 30 lens and noted the urethra to be normal. There was some slight narrowing in the area of the bulbar urethra but was able to negotiate the scope through this. The prostate revealed lateral lobe hypertrophy. No lesions were noted within the prostatic urethra. Upon entering the bladder and noted 2+ trabeculation. The ureteral orifices were noted  be of normal configuration and position. A bladder tumor was noted on the posterior wall of the bladder on the right-hand side and consisted of several papillary tumors with papillary changes of the mucosa surrounding these measuring approximately 4.5 cm.  A 6 French open-ended ureteral catheter was then passed through the cystoscope and into the left ureteral orifice in order to perform a left retrograde pyelogram. Full-strength Omnipaque contrast was then injected through the open ended catheter and into the left ureter under direct fluoroscopy. This revealed some narrowing of the distal ureter as it passed through the wall of the bladder with dilation of the ureter just proximal to this with tapering and further dilatation consistent with possible long-standing ureteral dilatation. I watched the contrast as it passed down the the ureter and through the left ureteral orifice and did not appear obstructed. The ureter was partially duplicated and the intrarenal collecting system was noted be normal with some dilatation as well.  A 0.038 inch floppy-tipped guidewire was then passed through the open-ended catheter in the up the left ureter under fluoroscopy. I left this in place and removed the cystoscope and the open-ended catheter and passed the rigid ureteroscope over the guidewire and into the left distal ureter. I passed the scope on up into the area where the ureter became dilated and then remove the guidewire and pulled the scope back through the area that was noted to be narrowed and noted no lesions or worrisome findings whatsoever. This appeared to be some compression of the intramural ureter due to bladder wall hypertrophy. The ureteroscope was therefore removed.   I then passed the 73 French resectoscope with the 30 lens and visual obturator  were then passed down the urethra under direct visualization. Urethra appeared normal but it was slightly narrowed and there was some resistance to passage but  I was able to pass the scope easily into the bladder. The visual obturator was then removed and the Gyrus resectoscope element with 30  lens was then inserted.  I first began by resecting the tumor from the back wall of the bladder. Reinspection of the bladder revealed all obvious tumor had been fully resected and there was no evidence of perforation. The Microvasive evacuator was then used to irrigate the bladder and remove all of the portions of bladder tumor which were sent to pathology. I then removed the resectoscope.  A 20 French Foley catheter was then inserted in the bladder and irrigated. The irrigant returned slightly pink with no clots. The patient was awakened and taken to the recovery room.  While in the recovery room 40 mg of mitomycin-C in 40 cc of water were instilled in the bladder through the catheter and the catheter was plugged. This will remain indwelling for approximately one hour. It will then be drained from the bladder and the catheter will be left indwelling with plans to remove it at the time of his follow-up and the patient discharged home.  PLAN OF CARE: Discharge to home after PACU  PATIENT DISPOSITION:  PACU - hemodynamically stable.

## 2015-02-25 NOTE — Transfer of Care (Signed)
Immediate Anesthesia Transfer of Care Note  Patient: Kimmy Pipitone  Procedure(s) Performed: Procedure(s) (LRB): TRANSURETHRAL RESECTION OF BLADDER TUMOR WITH GYRUS (TURBT-GYRUS) (N/A) LEFT RETROGRADE PYELOGRAM, URETEROSCOPY  (Left)  Patient Location: PACU  Anesthesia Type: General  Level of Consciousness: awake, sedated, patient cooperative and responds to stimulation  Airway & Oxygen Therapy: Patient Spontanous Breathing and Patient connected to face mask oxygen  Post-op Assessment: Report given to PACU RN, Post -op Vital signs reviewed and stable and Patient moving all extremities  Post vital signs: Reviewed and stable  Complications: No apparent anesthesia complications

## 2015-02-25 NOTE — H&P (Signed)
Benjamin Cardenas is a 81 year old male  History of Present Illness     Hematospermia: He has a history of having had hemospermia in the past but that was felt likely secondary to some prostate calcification that I had noted on transrectal ultrasound.     BPH with bladder outlet obstruction: He does have some slowing of his urinary stream, but only gets up once at night, has mild intermittency, no dysuria or hematuria.       Elevated PSA and prostate nodule: He has a known prostate nodule that he has had for some time. It is subtle and has remained unchanged over the years. It is in the right apex and essentially has the same consistency as the rest of the prostate. His PSA remains just above the normal range and fluctuates slightly to within the normal range but essentially has remained stable over the years.    Gross hematuria: He experienced gross hematuria that was not associated with flank pain or voiding symptoms. He takes a daily aspirin and has smoked in the past.    Interval history: No new urologic complaints are noted and specifically no further gross hematuria    Past Medical History Problems  1. History of Right Renal Artery Aneurysm     Surgical History Problems  1. History of Hernia Repair  Current Meds 1. Alcon Saline Sensitive Eyes SOLN;  Therapy: (Recorded:12Nov2008) to Recorded 2. Aspirin 81 MG TABS;  Therapy: (Recorded:12Nov2008) to Recorded 3. Benefiber TABS;  Therapy: (Recorded:25Oct2016) to Recorded 4. Fexofenadine HCl - 180 MG Oral Tablet;  Therapy: (Recorded:12Nov2008) to Recorded 5. Fish Oil CAPS;  Therapy: (Recorded:13Jul2011) to Recorded  Allergies Medication  1. Amoxicillin TABS 2. Cipro TABS 3. Doxy-Caps CAPS 4. Levaquin TABS 5. Sulfa Drugs 6. Zithromax CAPS  Family History Problems  1. Family history of Colon Cancer : Brother 2. Family history of Family Health Status Number Of Children   1 son; 2 daughters 3. Family history  of Heart Disease : Father 4. Family history of Heart Disease : Brother 5. Family history of Heart Disease : Sister  Social History Problems  1. Alcohol Use (History)   very seldom 2. Caffeine Use   drink hot tea daily 3. Former smoker [phone]) 4. Marital History - Currently Married 5. Occupation:   retired 97. Tobacco Use   quit smoking in 1980.  ROS: His 13 point review of systems was negative except for back pain, joint pain, sinus problems and hematuria.  Vitals Vital Signs  Height: 5 ft 7 in Weight: 145 lb  BMI Calculated: 22.71 BSA Calculated: 1.76 Blood Pressure: 116 / 77 Temperature: 97.5 F Heart Rate: 60  Physical Exam Constitutional: Well nourished and well developed . No acute distress.   ENT:. The ears and nose are normal in appearance.   Neck: The appearance of the neck is normal and no neck mass is present.   Pulmonary: No respiratory distress and normal respiratory rhythm and effort.   Cardiovascular: Heart rate and rhythm are normal . No peripheral edema.   Abdomen: The abdomen is soft and nontender. No masses are palpated. No CVA tenderness. No hernias are palpable. No hepatosplenomegaly noted.   Genitourinary: Examination of the penis demonstrates no discharge, no masses, no lesions and a normal meatus. The scrotum is without lesions. The right epididymis is palpably normal and non-tender. The left epididymis is palpably normal and non-tender. The right testis is non-tender and without masses. The left testis is non-tender and without masses.  Lymphatics: The femoral and inguinal nodes are not enlarged or tender.   Skin: Normal skin turgor, no visible rash and no visible skin lesions.   Neuro/Psych:. Mood and affect are appropriate.   Results/Data  The following images/tracing/specimen were independently visualized:  CT scan as below.  The following clinical lab reports were reviewed:  UA:.  CT scan. Selected Results  AU CT-HEMATURIA  PROTOCOL [id number] 12:00AM Kathie Rhodes  Test Name Result Flag Reference AU CT-HEMATURIA PROTOCOL (Report)   ** RADIOLOGY REPORT BY [facility] **   CLINICAL DATA: Micro hematuria which lasted for 3 days  EXAM: CT ABDOMEN AND PELVIS WITHOUT AND WITH CONTRAST  TECHNIQUE: Multidetector CT imaging of the abdomen and pelvis was performed following the standard protocol before and following the bolus administration of intravenous contrast.  CONTRAST: 125 cc Isovue  COMPARISON: None.  FINDINGS: Lower chest: Mild scarring at the lung bases  Hepatobiliary: No focal hepatic lesion. Sludge layers within the gallbladder. No duct dilatation. Common bile duct within normal limits.  Pancreas: Pancreas is normal. No ductal dilatation. No pancreatic inflammation.  Spleen: Normal spleen  Adrenals/urinary tract: Adrenal glands are normal. Non IV contrast images demonstrate no nephrolithiasis or ureterolithiasis. Rounded calcification in the RIGHT renal hilum measures 8 mm (image 27, series 8). Cortical phase imaging demonstrates no enhancing cortical lesion. Delayed pyelogram phase imaging demonstrates no filling defects within the ureter.  The LEFT ureter is mildly dilated throughout its course measuring 14 mm in diameter through the pelvic brim (image 58, series 8).  There is thickening in the dome of the bladder with an nodular/ sessile =pattern (image 58-61 of series 8). The bladder is distended. The prostate gland is enlarged to 53 mm.  On the sagittal projection this nodular bladder indentation seen on the delayed imaging is seen as nodular enhancement (image 80, series 501, sagittal series and sagittal delayed series 901, image 63  Stomach/Bowel: Stomach, small-bowel, appendix, cecum normal. The colon rectosigmoid colon are normal.  Vascular/Lymphatic: Abdominal aorta is normal caliber with atherosclerotic calcification. There is no retroperitoneal  or periportal lymphadenopathy. No pelvic lymphadenopathy.  Reproductive: Prostate gland enlarged  Other: No free fluid.  Musculoskeletal: No aggressive osseous lesion.  IMPRESSION: 1. Nodular enhancement within the posterior aspect of the dome of the bladder with top differential including bladder inflammation versus bladder neoplasm. 2. Bladder is distended with thickened wall consistent with mild bladder outlet obstruction. 3. Mild hydroureter on the LEFT may relate to bladder outlet obstruction. No obstructing lesions identified. 4. No enhancing renal lesion renal calculi. 5. Potential small calcified renal artery aneurysm noted in the RIGHT renal hilum.   Electronically Signed  By: Suzy Bouchard M.D.  On: 02/05/2015 17:22  BUN & CREATININE [id number] 12:58PM Kathie Rhodes SPECIMEN TYPE: BLOOD  Test Name Result Flag Reference CREATININE 0.74 mg/dL  0.50-1.50 BUN 14 mg/dL  7-25 Est GFR, African American >89 mL/min  >=60 Est GFR, NonAfrican American 85 mL/min  >=60 THE ESTIMATED GFR IS A CALCULATION VALID FOR ADULTS (>=84 YEARS OLD) THAT USES THE CKD-EPI ALGORITHM TO ADJUST FOR AGE AND SEX. IT IS   NOT TO BE USED FOR CHILDREN, PREGNANT WOMEN, HOSPITALIZED PATIENTS,    PATIENTS ON DIALYSIS, OR WITH RAPIDLY CHANGING KIDNEY FUNCTION. ACCORDING TO THE NKDEP, EGFR >89 IS NORMAL, 60-89 SHOWS MILD IMPAIRMENT, 30-59 SHOWS MODERATE IMPAIRMENT, 15-29 SHOWS SEVERE IMPAIRMENT AND <15 IS ESRD.  Procedure  Procedure: Cystoscopy done on 02/14/15  Indication: Hematuria.  Informed Consent: Risks, benefits, and potential adverse events were  discussed and informed consent was obtained from the patient.  Prep: The patient was prepped with betadine.  Procedure Note:  Urethral meatus:. No abnormalities.  Anterior urethra: No abnormalities.  Prostatic urethra:. There was visual obstruction of the prostatic urethra. The lateral and median prostatic lobes were enlarged.  Bladder:  Visulization was clear. The ureteral orifices were in the normal anatomic position bilaterally and had clear efflux of urine. Multiple tumors were identified in the bladder. This tumor was located on the posterior aspect of the bladder. The patient tolerated the procedure well.  Complications: None.    Assessment  His PSA is normal at 3.93.  His CT scan revealed his right renal artery aneurysm which has been noted previously. In addition there was mild left hydronephrosis down to the level of the bladder without an obstructing stone or filling defect consistent outlet obstruction and possible left vesicoureteral reflux.     I went over the results of the CT scan as well as my cystoscopic findings today which has revealed a bladder mass consistent with transitional cell carcinoma. We discussed the fact that currently there is no evidence of extravesical extension or pelvic adenopathy based on CT scan findings. Further characterization of the lesion is required for grading and staging purposes. We discussed proceeding with evaluation using transurethral resection of the lesion. I have discussed the procedure in detail as well as the potential risks and complications associated with this form of surgery. We also discussed the probability of successful resection of the intravesical portion of this lesion. I have recommended, as long as there is no contraindication at the time of surgery, the placement of intravesical mitomycin-C in order to reduce the risk of recurrence. We did discuss the potential side effects of this form of intravesical chemotherapy. The procedure will be performed under anesthesia as an outpatient.    Plan   1. Stop aspirin.  2. He will be scheduled for cystoscopy, left retrograde pyelogram with possible ureteroscopy and TURBT.  3. Mitomycin-C postoperatively.

## 2015-02-25 NOTE — Discharge Instructions (Signed)
Transurethral Resection of Bladder Tumor (TURBT)   Definition:  Transurethral Resection of the Bladder Tumor is a surgical procedure used to diagnose and remove tumors within the bladder. TURBT is the most common treatment for early stage bladder cancer.  General instructions:     Your recent bladder surgery requires very little post hospital care but some definite precautions.  Despite the fact that no skin incisions were used, the area around the bladder incisions are raw and covered with scabs to promote healing and prevent bleeding. Certain precautions are needed to insure that the scabs are not disturbed over the next 2-4 weeks while the healing proceeds.  Because the raw surface inside your bladder and the irritating effects of urine you may expect frequency of urination and/or urgency (a stronger desire to urinate) and perhaps even getting up at night more often. This will usually resolve or improve slowly over the healing period. You may see some blood in your urine over the first 6 weeks. Do not be alarmed, even if the urine was clear for a while. Get off your feet and drink lots of fluids until clearing occurs. If you start to pass clots or don't improve call us.  Catheter: (If you are discharged with a catheter.)  1. Keep your catheter secured to your leg at all times with tape or the supplied strap. 2. You may experience leakage of urine around your catheter- as long as the  catheter continues to drain, this is normal.  If your catheter stops draining  go to the ER. 3. You may also have blood in your urine, even after it has been clear for  several days; you may even pass some small blood clots or other material.  This  is normal as well.  If this happens, sit down and drink plenty of water to help  make urine to flush out your bladder.  If the blood in your urine becomes worse  after doing this, contact our office or return to the ER. 4. You may use the leg bag (small bag)  during the day, but use the large bag at  night.  Diet:  You may return to your normal diet immediately. Because of the raw surface of your bladder, alcohol, spicy foods, foods high in acid and drinks with caffeine may cause irritation or frequency and should be used in moderation. To keep your urine flowing freely and avoid constipation, drink plenty of fluids during the day (8-10 glasses). Tip: Avoid cranberry juice because it is very acidic.  Activity:  Your physical activity doesn't need to be restricted. However, if you are very active, you may see some blood in the urine. We suggest that you reduce your activity under the circumstances until the bleeding has stopped.  Bowels:  It is important to keep your bowels regular during the postoperative period. Straining with bowel movements can cause bleeding. A bowel movement every other day is reasonable. Use a mild laxative if needed, such as milk of magnesia 2-3 tablespoons, or 2 Dulcolax tablets. Call if you continue to have problems. If you had been taking narcotics for pain, before, during or after your surgery, you may be constipated. Take a laxative if necessary.    Medication:  You should resume your pre-surgery medications unless told not to. In addition you may be given an antibiotic to prevent or treat infection. Antibiotics are not always necessary. All medication should be taken as prescribed until the bottles are finished unless you are having  an unusual reaction to one of the drugs.    Post Anesthesia Home Care Instructions  Activity: Get plenty of rest for the remainder of the day. A responsible adult should stay with you for 24 hours following the procedure.  For the next 24 hours, DO NOT: -Drive a car -Paediatric nurse -Drink alcoholic beverages -Take any medication unless instructed by your physician -Make any legal decisions or sign important papers.  Meals: Start with liquid foods such as gelatin or soup.  Progress to regular foods as tolerated. Avoid greasy, spicy, heavy foods. If nausea and/or vomiting occur, drink only clear liquids until the nausea and/or vomiting subsides. Call your physician if vomiting continues.  Special Instructions/Symptoms: Your throat may feel dry or sore from the anesthesia or the breathing tube placed in your throat during surgery. If this causes discomfort, gargle with warm salt water. The discomfort should disappear within 24 hours.  If you had a scopolamine patch placed behind your ear for the management of post- operative nausea and/or vomiting:  1. The medication in the patch is effective for 72 hours, after which it should be removed.  Wrap patch in a tissue and discard in the trash. Wash hands thoroughly with soap and water. 2. You may remove the patch earlier than 72 hours if you experience unpleasant side effects which may include dry mouth, dizziness or visual disturbances. 3. Avoid touching the patch. Wash your hands with soap and water after contact with the patch.   Foley Catheter Care, Adult A Foley catheter is a soft, flexible tube that is placed into the bladder to drain urine. A Foley catheter may be inserted if:  You leak urine or are not able to control when you urinate (urinary incontinence).  You are not able to urinate when you need to (urinary retention).  You had prostate surgery or surgery on the genitals.  You have certain medical conditions, such as multiple sclerosis, dementia, or a spinal cord injury. If you are going home with a Foley catheter in place, follow the instructions below. TAKING CARE OF THE CATHETER 1. Wash your hands with soap and water. 2. Using mild soap and warm water on a clean washcloth:  Clean the area on your body closest to the catheter insertion site using a circular motion, moving away from the catheter. Never wipe toward the catheter because this could sweep bacteria up into the urethra and cause  infection.  Remove all traces of soap. Pat the area dry with a clean towel. For males, reposition the foreskin. 3. Attach the catheter to your leg so there is no tension on the catheter. Use adhesive tape or a leg strap. If you are using adhesive tape, remove any sticky residue left behind by the previous tape you used. 4. Keep the drainage bag below the level of the bladder, but keep it off the floor. 5. Check throughout the day to be sure the catheter is working and urine is draining freely. Make sure the tubing does not become kinked. 6. Do not pull on the catheter or try to remove it. Pulling could damage internal tissues. TAKING CARE OF THE DRAINAGE BAGS You will be given two drainage bags to take home. One is a large overnight drainage bag, and the other is a smaller leg bag that fits underneath clothing. You may wear the overnight bag at any time, but you should never wear the smaller leg bag at night. Follow the instructions below for how to empty, change, and  clean your drainage bags. Emptying the Drainage Bag You must empty your drainage bag when it is  - full or at least 2-3 times a day. 1. Wash your hands with soap and water. 2. Keep the drainage bag below your hips, below the level of your bladder. This stops urine from going back into the tubing and into your bladder. 3. Hold the dirty bag over the toilet or a clean container. 4. Open the pour spout at the bottom of the bag and empty the urine into the toilet or container. Do not let the pour spout touch the toilet, container, or any other surface. Doing so can place bacteria on the bag, which can cause an infection. 5. Clean the pour spout with a gauze pad or cotton ball that has rubbing alcohol on it. 6. Close the pour spout. 7. Attach the bag to your leg with adhesive tape or a leg strap. 8. Wash your hands well. Changing the Drainage Bag Change your drainage bag once a month or sooner if it starts to smell bad or look dirty.  Below are steps to follow when changing the drainage bag. 1. Wash your hands with soap and water. 2. Pinch off the rubber catheter so that urine does not spill out. 3. Disconnect the catheter tube from the drainage tube at the connection valve. Do not let the tubes touch any surface. 4. Clean the end of the catheter tube with an alcohol wipe. Use a different alcohol wipe to clean the end of the drainage tube. 5. Connect the catheter tube to the drainage tube of the clean drainage bag. 6. Attach the new bag to the leg with adhesive tape or a leg strap. Avoid attaching the new bag too tightly. 7. Wash your hands well. Cleaning the Drainage Bag 1. Wash your hands with soap and water. 2. Wash the bag in warm, soapy water. 3. Rinse the bag thoroughly with warm water. 4. Fill the bag with a solution of white vinegar and water (1 cup vinegar to 1 qt warm water [.2 L vinegar to 1 L warm water]). Close the bag and soak it for 30 minutes in the solution. 5. Rinse the bag with warm water. 6. Hang the bag to dry with the pour spout open and hanging downward. 7. Store the clean bag (once it is dry) in a clean plastic bag. 8. Wash your hands well. PREVENTING INFECTION  Wash your hands before and after handling your catheter.  Take showers daily and wash the area where the catheter enters your body. Do not take baths. Replace wet leg straps with dry ones, if this applies.  Do not use powders, sprays, or lotions on the genital area. Only use creams, lotions, or ointments as directed by your caregiver.  For females, wipe from front to back after each bowel movement.  Drink enough fluids to keep your urine clear or pale yellow unless you have a fluid restriction.  Do not let the drainage bag or tubing touch or lie on the floor.  Wear cotton underwear to absorb moisture and to keep your skin drier. SEEK MEDICAL CARE IF:   Your urine is cloudy or smells unusually bad.  Your catheter becomes  clogged.  You are not draining urine into the bag or your bladder feels full.  Your catheter starts to leak. SEEK IMMEDIATE MEDICAL CARE IF:   You have pain, swelling, redness, or pus where the catheter enters the body.  You have pain in the abdomen,  legs, lower back, or bladder.  You have a fever.  You see blood fill the catheter, or your urine is pink or red.  You have nausea, vomiting, or chills.  Your catheter gets pulled out. MAKE SURE YOU:   Understand these instructions.  Will watch your condition.  Will get help right away if you are not doing well or get worse.   This information is not intended to replace advice given to you by your health care provider. Make sure you discuss any questions you have with your health care provider.   Document Released: 03/23/2005 Document Revised: 08/07/2013 Document Reviewed: 03/14/2012 Elsevier Interactive Patient Education [employer].

## 2015-02-26 ENCOUNTER — Encounter (HOSPITAL_BASED_OUTPATIENT_CLINIC_OR_DEPARTMENT_OTHER): Payer: Self-pay | Admitting: Urology

## 2015-03-04 DIAGNOSIS — R3914 Feeling of incomplete bladder emptying: Secondary | ICD-10-CM | POA: Diagnosis not present

## 2015-03-04 DIAGNOSIS — N401 Enlarged prostate with lower urinary tract symptoms: Secondary | ICD-10-CM | POA: Diagnosis not present

## 2015-03-04 DIAGNOSIS — N138 Other obstructive and reflux uropathy: Secondary | ICD-10-CM | POA: Diagnosis not present

## 2015-03-11 ENCOUNTER — Other Ambulatory Visit: Payer: Self-pay | Admitting: Urology

## 2015-03-14 DIAGNOSIS — C689 Malignant neoplasm of urinary organ, unspecified: Secondary | ICD-10-CM | POA: Diagnosis not present

## 2015-03-18 DIAGNOSIS — N1330 Unspecified hydronephrosis: Secondary | ICD-10-CM | POA: Diagnosis not present

## 2015-03-18 DIAGNOSIS — C679 Malignant neoplasm of bladder, unspecified: Secondary | ICD-10-CM | POA: Diagnosis not present

## 2015-03-25 DIAGNOSIS — C670 Malignant neoplasm of trigone of bladder: Secondary | ICD-10-CM | POA: Diagnosis not present

## 2015-03-25 DIAGNOSIS — Z713 Dietary counseling and surveillance: Secondary | ICD-10-CM | POA: Diagnosis not present

## 2015-04-03 DIAGNOSIS — M720 Palmar fascial fibromatosis [Dupuytren]: Secondary | ICD-10-CM | POA: Diagnosis not present

## 2015-04-03 DIAGNOSIS — M65341 Trigger finger, right ring finger: Secondary | ICD-10-CM | POA: Diagnosis not present

## 2015-04-03 DIAGNOSIS — M79644 Pain in right finger(s): Secondary | ICD-10-CM | POA: Diagnosis not present

## 2015-04-16 ENCOUNTER — Encounter (HOSPITAL_BASED_OUTPATIENT_CLINIC_OR_DEPARTMENT_OTHER): Payer: Self-pay | Admitting: *Deleted

## 2015-04-16 NOTE — Progress Notes (Signed)
NPO AFTER MN.  ARRIVE AT 0945.  NEEDS ISTAT 8.

## 2015-04-22 ENCOUNTER — Encounter (HOSPITAL_BASED_OUTPATIENT_CLINIC_OR_DEPARTMENT_OTHER)
Admission: RE | Disposition: A | Discharge status: Home / Self Care | Payer: Self-pay | Source: Ambulatory Visit | Attending: Urology

## 2015-04-22 ENCOUNTER — Ambulatory Visit (HOSPITAL_BASED_OUTPATIENT_CLINIC_OR_DEPARTMENT_OTHER): Payer: Medicare Other | Admitting: Anesthesiology

## 2015-04-22 ENCOUNTER — Ambulatory Visit (HOSPITAL_BASED_OUTPATIENT_CLINIC_OR_DEPARTMENT_OTHER)
Admission: RE | Admit: 2015-04-22 | Discharge: 2015-04-22 | Disposition: A | Discharge status: Home / Self Care | Payer: Medicare Other | Source: Ambulatory Visit | Attending: Urology | Admitting: Urology

## 2015-04-22 ENCOUNTER — Encounter (HOSPITAL_BASED_OUTPATIENT_CLINIC_OR_DEPARTMENT_OTHER): Payer: Self-pay | Admitting: *Deleted

## 2015-04-22 DIAGNOSIS — N403 Nodular prostate with lower urinary tract symptoms: Secondary | ICD-10-CM | POA: Diagnosis not present

## 2015-04-22 DIAGNOSIS — N138 Other obstructive and reflux uropathy: Secondary | ICD-10-CM | POA: Insufficient documentation

## 2015-04-22 DIAGNOSIS — N3090 Cystitis, unspecified without hematuria: Secondary | ICD-10-CM | POA: Diagnosis not present

## 2015-04-22 DIAGNOSIS — R3912 Poor urinary stream: Secondary | ICD-10-CM | POA: Insufficient documentation

## 2015-04-22 DIAGNOSIS — Z7982 Long term (current) use of aspirin: Secondary | ICD-10-CM | POA: Insufficient documentation

## 2015-04-22 DIAGNOSIS — N401 Enlarged prostate with lower urinary tract symptoms: Secondary | ICD-10-CM | POA: Insufficient documentation

## 2015-04-22 DIAGNOSIS — Z79899 Other long term (current) drug therapy: Secondary | ICD-10-CM | POA: Insufficient documentation

## 2015-04-22 DIAGNOSIS — Z87891 Personal history of nicotine dependence: Secondary | ICD-10-CM | POA: Diagnosis not present

## 2015-04-22 DIAGNOSIS — Z8551 Personal history of malignant neoplasm of bladder: Secondary | ICD-10-CM | POA: Insufficient documentation

## 2015-04-22 DIAGNOSIS — R972 Elevated prostate specific antigen [PSA]: Secondary | ICD-10-CM | POA: Insufficient documentation

## 2015-04-22 DIAGNOSIS — C679 Malignant neoplasm of bladder, unspecified: Secondary | ICD-10-CM | POA: Diagnosis not present

## 2015-04-22 DIAGNOSIS — D494 Neoplasm of unspecified behavior of bladder: Secondary | ICD-10-CM | POA: Diagnosis not present

## 2015-04-22 HISTORY — DX: Elevated prostate specific antigen (PSA): R97.20

## 2015-04-22 HISTORY — DX: Benign prostatic hyperplasia with lower urinary tract symptoms: N13.8

## 2015-04-22 HISTORY — DX: Malignant neoplasm of bladder, unspecified: C67.9

## 2015-04-22 HISTORY — DX: Nodular prostate without lower urinary tract symptoms: N40.2

## 2015-04-22 HISTORY — PX: CYSTOSCOPY WITH BIOPSY: SHX5122

## 2015-04-22 HISTORY — DX: Other obstructive and reflux uropathy: N40.1

## 2015-04-22 LAB — POCT I-STAT, CHEM 8
BUN: 18 mg/dL (ref 6–20)
Calcium, Ion: 1.3 mmol/L (ref 1.13–1.30)
Chloride: 103 mmol/L (ref 101–111)
Creatinine, Ser: 0.7 mg/dL (ref 0.61–1.24)
Glucose, Bld: 83 mg/dL (ref 65–99)
HCT: 44 % (ref 39.0–52.0)
Hemoglobin: 15 g/dL (ref 13.0–17.0)
Potassium: 4.2 mmol/L (ref 3.5–5.1)
Sodium: 142 mmol/L (ref 135–145)
TCO2: 28 mmol/L (ref 0–100)

## 2015-04-22 SURGERY — CYSTOSCOPY, WITH BIOPSY
Anesthesia: General

## 2015-04-22 MED ORDER — ONDANSETRON HCL 4 MG/2ML IJ SOLN
4.0000 mg | Freq: Once | INTRAMUSCULAR | Status: DC | PRN
  Filled 2015-04-22: qty 2

## 2015-04-22 MED ORDER — ONDANSETRON HCL 4 MG/2ML IJ SOLN
INTRAMUSCULAR | Status: AC
  Filled 2015-04-22: qty 2

## 2015-04-22 MED ORDER — ACETAMINOPHEN 160 MG/5ML PO SOLN
650.0000 mg | Freq: Once | ORAL | Status: AC
  Administered 2015-04-22: 650 mg via ORAL
  Filled 2015-04-22: qty 20.3

## 2015-04-22 MED ORDER — ONDANSETRON HCL 4 MG/2ML IJ SOLN
INTRAMUSCULAR | Status: DC | PRN
  Administered 2015-04-22: 4 mg via INTRAVENOUS

## 2015-04-22 MED ORDER — PROPOFOL 10 MG/ML IV BOLUS
INTRAVENOUS | Status: DC | PRN
  Administered 2015-04-22: 160 mg via INTRAVENOUS
  Administered 2015-04-22: 20 mg via INTRAVENOUS

## 2015-04-22 MED ORDER — LIDOCAINE HCL (CARDIAC) 20 MG/ML IV SOLN
INTRAVENOUS | Status: AC
  Filled 2015-04-22: qty 5

## 2015-04-22 MED ORDER — MEPERIDINE HCL 25 MG/ML IJ SOLN
6.2500 mg | INTRAMUSCULAR | Status: DC | PRN
  Filled 2015-04-22: qty 1

## 2015-04-22 MED ORDER — EPHEDRINE SULFATE 50 MG/ML IJ SOLN
INTRAMUSCULAR | Status: AC
  Filled 2015-04-22: qty 1

## 2015-04-22 MED ORDER — VANCOMYCIN HCL IN DEXTROSE 1-5 GM/200ML-% IV SOLN
1000.0000 mg | INTRAVENOUS | Status: AC
  Administered 2015-04-22: 1000 mg via INTRAVENOUS
  Filled 2015-04-22 (×2): qty 200

## 2015-04-22 MED ORDER — ACETAMINOPHEN 160 MG/5ML PO SOLN
ORAL | Status: AC
  Filled 2015-04-22: qty 20.3

## 2015-04-22 MED ORDER — GENTAMICIN SULFATE 40 MG/ML IJ SOLN
5.0000 mg/kg | INTRAVENOUS | Status: AC
  Administered 2015-04-22: 330 mg via INTRAVENOUS
  Filled 2015-04-22 (×2): qty 8.25

## 2015-04-22 MED ORDER — PROPOFOL 10 MG/ML IV BOLUS
INTRAVENOUS | Status: AC
  Filled 2015-04-22: qty 20

## 2015-04-22 MED ORDER — EPHEDRINE SULFATE 50 MG/ML IJ SOLN
INTRAMUSCULAR | Status: DC | PRN
  Administered 2015-04-22: 10 mg via INTRAVENOUS

## 2015-04-22 MED ORDER — LIDOCAINE HCL (CARDIAC) 20 MG/ML IV SOLN
INTRAVENOUS | Status: DC | PRN
  Administered 2015-04-22: 60 mg via INTRAVENOUS

## 2015-04-22 MED ORDER — HYDROCODONE-ACETAMINOPHEN 10-325 MG PO TABS: 1.0000 | ORAL_TABLET | ORAL | Status: DC | PRN

## 2015-04-22 MED ORDER — FENTANYL CITRATE (PF) 100 MCG/2ML IJ SOLN
25.0000 ug | INTRAMUSCULAR | Status: DC | PRN
  Filled 2015-04-22: qty 1

## 2015-04-22 MED ORDER — FENTANYL CITRATE (PF) 100 MCG/2ML IJ SOLN
INTRAMUSCULAR | Status: AC
  Filled 2015-04-22: qty 2

## 2015-04-22 MED ORDER — LACTATED RINGERS IV SOLN
INTRAVENOUS | Status: DC
  Administered 2015-04-22: 11:00:00 via INTRAVENOUS
  Filled 2015-04-22: qty 1000

## 2015-04-22 MED ORDER — PHENAZOPYRIDINE HCL 200 MG PO TABS: 200.0000 mg | ORAL_TABLET | Freq: Three times a day (TID) | ORAL | Status: DC | PRN

## 2015-04-22 MED ORDER — FENTANYL CITRATE (PF) 100 MCG/2ML IJ SOLN
INTRAMUSCULAR | Status: DC | PRN
  Administered 2015-04-22 (×8): 12.5 ug via INTRAVENOUS

## 2015-04-22 SURGICAL SUPPLY — 36 items
BAG DRAIN URO-CYSTO SKYTR STRL (DRAIN) ×2 IMPLANT
BAG DRN ANRFLXCHMBR STRAP LEK (BAG)
BAG DRN UROCATH (DRAIN) ×1
BAG URINE DRAINAGE (UROLOGICAL SUPPLIES) IMPLANT
BAG URINE LEG 19OZ MD ST LTX (BAG) IMPLANT
CANISTER SUCT LVC 12 LTR MEDI- (MISCELLANEOUS) IMPLANT
CATH FOLEY 2WAY SLVR  5CC 20FR (CATHETERS)
CATH FOLEY 2WAY SLVR  5CC 22FR (CATHETERS)
CATH FOLEY 2WAY SLVR  5CC 24FR (CATHETERS) ×1
CATH FOLEY 2WAY SLVR 5CC 20FR (CATHETERS) IMPLANT
CATH FOLEY 2WAY SLVR 5CC 22FR (CATHETERS) IMPLANT
CATH FOLEY 2WAY SLVR 5CC 24FR (CATHETERS) ×1 IMPLANT
CATH FOLEY 3WAY 20FR (CATHETERS) IMPLANT
CLOTH BEACON ORANGE TIMEOUT ST (SAFETY) ×2 IMPLANT
ELECT BIVAP BIPO 22/24 DONUT (ELECTROSURGICAL) ×2
ELECT LOOP MED HF 24F 12D (CUTTING LOOP) IMPLANT
ELECT REM PT RETURN 9FT ADLT (ELECTROSURGICAL) ×2
ELECTRD BIVAP BIPO 22/24 DONUT (ELECTROSURGICAL) ×1 IMPLANT
ELECTRODE REM PT RTRN 9FT ADLT (ELECTROSURGICAL) ×1 IMPLANT
EVACUATOR MICROVAS BLADDER (UROLOGICAL SUPPLIES) IMPLANT
GLOVE BIO SURGEON STRL SZ8 (GLOVE) ×2 IMPLANT
GOWN STRL REUS W/ TWL LRG LVL3 (GOWN DISPOSABLE) ×1 IMPLANT
GOWN STRL REUS W/ TWL XL LVL3 (GOWN DISPOSABLE) ×1 IMPLANT
GOWN STRL REUS W/TWL LRG LVL3 (GOWN DISPOSABLE) ×2
GOWN STRL REUS W/TWL XL LVL3 (GOWN DISPOSABLE) ×2
HOLDER FOLEY CATH W/STRAP (MISCELLANEOUS) IMPLANT
IV NS IRRIG 3000ML ARTHROMATIC (IV SOLUTION) ×1 IMPLANT
KIT ROOM TURNOVER WOR (KITS) ×2 IMPLANT
MANIFOLD NEPTUNE II (INSTRUMENTS) IMPLANT
NEEDLE HYPO 22GX1.5 SAFETY (NEEDLE) IMPLANT
NS IRRIG 500ML POUR BTL (IV SOLUTION) IMPLANT
PACK CYSTO (CUSTOM PROCEDURE TRAY) ×2 IMPLANT
PLUG CATH AND CAP STER (CATHETERS) IMPLANT
SET ASPIRATION TUBING (TUBING) IMPLANT
TUBE CONNECTING 12X1/4 (SUCTIONS) IMPLANT
WATER STERILE IRR 3000ML UROMA (IV SOLUTION) ×3 IMPLANT

## 2015-04-22 NOTE — Anesthesia Procedure Notes (Signed)
Procedure Name: LMA Insertion Date/Time: 04/22/2015 11:34 AM Performed by: Justice Rocher Pre-anesthesia Checklist: Patient identified, Emergency Drugs available, Suction available and Patient being monitored Patient Re-evaluated:Patient Re-evaluated prior to inductionOxygen Delivery Method: Circle System Utilized Preoxygenation: Pre-oxygenation with 100% oxygen Intubation Type: IV induction Ventilation: Mask ventilation without difficulty LMA: LMA inserted LMA Size: 5.0 Number of attempts: 1 Airway Equipment and Method: Bite block Placement Confirmation: positive ETCO2 Tube secured with: Tape Dental Injury: Teeth and Oropharynx as per pre-operative assessment

## 2015-04-22 NOTE — Op Note (Signed)
PATIENT:  Benjamin Cardenas  PRE-OPERATIVE DIAGNOSIS: History of bladder cancer  POST-OPERATIVE DIAGNOSIS: Same  PROCEDURE: Cystoscopy with bladder biopsies  SURGEON:  Claybon Jabs  INDICATION: Melvern Cardenas is a 82 year old male who experienced gross hematuria and was found to have no abnormality of the upper tract. He underwent you are BT 11/16 and this revealed high-grade transitional cell carcinoma stage T1. He is brought back to the operating room today for repeat biopsies.  ANESTHESIA:  General  EBL:  Minimal  DRAINS: None  LOCAL MEDICATIONS USED:  None  SPECIMEN:  Bladder biopsies to pathology  Description of procedure: After informed consent the patient was taken to the operating room and placed on the table in a supine position. General anesthesia was then administered. Once fully anesthetized the patient was moved to the dorsal lithotomy position and the genitalia were sterilely prepped and draped in standard fashion. An official timeout was then performed.  The 23 French cystoscope was then passed under direct vision down the urethra which is noted be normal. The prostatic urethra revealed mild bilobar hypertrophy but had no lesions. Upon entering the bladder I noted 2+ trabeculation. The ureteral orifices were again noted to be a normal configuration and position. The area of previous resection was noted on the posterior wall. There were some fibrinous deposits in the base. No other tumors were identified within the bladder upon full inspection using both 30 and 70 lenses.  I initially inserted the Tauber biopsy forceps and obtained apices from the base of the previously resected tumor. I then switched out to the cold cup biopsy forceps and obtained further biopsies. I inserted the Bugbee electrode and fulgurated all of the biopsy sites and at the end of the procedure there was no bleeding evident. I therefore drained the bladder, removed the cystoscope and the patient was  awakened and taken to the recovery room in stable and satisfactory condition. He tolerated procedure well no intraoperative complications.  PLAN OF CARE: Discharge to home after PACU  PATIENT DISPOSITION:  PACU - hemodynamically stable.

## 2015-04-22 NOTE — Anesthesia Postprocedure Evaluation (Signed)
Anesthesia Post Note  Patient: Benjamin Cardenas  Procedure(s) Performed: Procedure(s) (LRB): CYSTOSCOPY WITH BLADDER BIOPSY (N/A)  Patient location during evaluation: PACU Anesthesia Type: General Level of consciousness: sedated Pain management: satisfactory to patient Vital Signs Assessment: post-procedure vital signs reviewed and stable Respiratory status: spontaneous breathing Cardiovascular status: stable Anesthetic complications: no    Last Vitals:  Filed Vitals:   04/22/15 1245 04/22/15 1300  BP: 118/73 113/66  Pulse: 69 63  Temp:    Resp: 17 19    Last Pain: There were no vitals filed for this visit.               Riccardo Dubin

## 2015-04-22 NOTE — Transfer of Care (Signed)
Immediate Anesthesia Transfer of Care Note  Patient: Benjamin Cardenas  Procedure(s) Performed: Procedure(s) (LRB): CYSTOSCOPY WITH BLADDER BIOPSY (N/A)  Patient Location: PACU  Anesthesia Type: General  Level of Consciousness: awake, sedated, patient cooperative and responds to stimulation  Airway & Oxygen Therapy: Patient Spontanous Breathing and Patient connected to face mask oxygen  Post-op Assessment: Report given to PACU RN, Post -op Vital signs reviewed and stable and Patient moving all extremities  Post vital signs: Reviewed and stable  Complications: No apparent anesthesia complications

## 2015-04-22 NOTE — Anesthesia Preprocedure Evaluation (Addendum)
Anesthesia Evaluation  Patient identified by MRN, date of birth, ID band Patient awake    Reviewed: Allergy & Precautions, NPO status , Patient's Chart, lab work & pertinent test results  Airway Mallampati: II  TM Distance: >3 FB Neck ROM: Full    Dental no notable dental hx.    Pulmonary neg pulmonary ROS, former smoker,    Pulmonary exam normal breath sounds clear to auscultation       Cardiovascular negative cardio ROS Normal cardiovascular exam Rhythm:Regular Rate:Normal     Neuro/Psych negative neurological ROS  negative psych ROS   GI/Hepatic negative GI ROS, Neg liver ROS,   Endo/Other  negative endocrine ROS  Renal/GU negative Renal ROS  negative genitourinary   Musculoskeletal negative musculoskeletal ROS (+)   Abdominal   Peds negative pediatric ROS (+)  Hematology negative hematology ROS (+)   Anesthesia Other Findings   Reproductive/Obstetrics negative OB ROS                            Anesthesia Physical  Anesthesia Plan  ASA: I  Anesthesia Plan: General   Post-op Pain Management:    Induction: Intravenous  Airway Management Planned: LMA  Additional Equipment:   Intra-op Plan:   Post-operative Plan: Extubation in OR  Informed Consent: I have reviewed the patients History and Physical, chart, labs and discussed the procedure including the risks, benefits and alternatives for the proposed anesthesia with the patient or authorized representative who has indicated his/her understanding and acceptance.   Dental advisory given  Plan Discussed with: CRNA and Surgeon  Anesthesia Plan Comments:        Anesthesia Quick Evaluation                                  Anesthesia Evaluation  Patient identified by MRN, date of birth, ID band Patient awake    Reviewed: Allergy & Precautions, NPO status , Patient's Chart, lab work & pertinent test  results  Airway Mallampati: II  TM Distance: >3 FB Neck ROM: Full    Dental no notable dental hx.    Pulmonary neg pulmonary ROS, former smoker,    Pulmonary exam normal breath sounds clear to auscultation       Cardiovascular negative cardio ROS Normal cardiovascular exam Rhythm:Regular Rate:Normal     Neuro/Psych negative neurological ROS  negative psych ROS   GI/Hepatic negative GI ROS, Neg liver ROS,   Endo/Other  negative endocrine ROS  Renal/GU negative Renal ROS  negative genitourinary   Musculoskeletal negative musculoskeletal ROS (+)   Abdominal   Peds negative pediatric ROS (+)  Hematology negative hematology ROS (+)   Anesthesia Other Findings   Reproductive/Obstetrics negative OB ROS                             Anesthesia Physical Anesthesia Plan  ASA: I  Anesthesia Plan: General   Post-op Pain Management:    Induction: Intravenous  Airway Management Planned: LMA and Oral ETT  Additional Equipment:   Intra-op Plan:   Post-operative Plan: Extubation in OR  Informed Consent: I have reviewed the patients History and Physical, chart, labs and discussed the procedure including the risks, benefits and alternatives for the proposed anesthesia with the patient or authorized representative who has indicated his/her understanding and acceptance.   Dental advisory given  Plan  Discussed with: CRNA and Surgeon  Anesthesia Plan Comments:         Anesthesia Quick Evaluation

## 2015-04-22 NOTE — H&P (Signed)
Mr. Fought is a 82 year old male with a history of transitional cell carcinoma of the bladder.  History of Present Illness         Hematospermia: He has a history of having had hemospermia in the past but that was felt likely secondary to some prostate calcification that I had noted on transrectal ultrasound.     BPH with bladder outlet obstruction: He does have some slowing of his urinary stream, but only gets up once at night, has mild intermittency, no dysuria or hematuria.       Elevated PSA and prostate nodule: He has a known prostate nodule that he has had for some time. It is subtle and has remained unchanged over the years. It is in the right apex and essentially has the same consistency as the rest of the prostate. His PSA remains just above the normal range and fluctuates slightly to within the normal range but essentially has remained stable over the years.    Transitional cell carcinoma of the bladder: He experienced gross hematuria that was not associated with flank pain or voiding symptoms. He takes a daily aspirin and has smoked in the past.  CT scan 02/05/15 - Left hydronephrosis down to the bladder.  TURBT 02/25/15 + Mitomycin-C  Pathology: High-grade, transitional cell carcinoma with invasion through the muscularis mucosa but not into the muscularis propria.  Stage: T1,G3     Interval history: He is doing well and said his urine cleared after surgery.     Past Medical History Problems  1. History of Right Renal Artery Aneurysm  Surgical History Problems  1. History of Hernia Repair  Current Meds 1. Alcon Saline Sensitive Eyes SOLN;  Therapy: (Recorded:12Nov2008) to Recorded 2. Aspirin 81 MG TABS;  Therapy: (Recorded:12Nov2008) to Recorded 3. Benefiber TABS;  Therapy: (Recorded:25Oct2016) to Recorded 4. Fexofenadine HCl - 180 MG Oral Tablet;  Therapy: (Recorded:12Nov2008) to Recorded 5. Fish Oil CAPS;  Therapy: (Recorded:13Jul2011) to  Recorded  Allergies Medication  1. Amoxicillin TABS 2. Cipro TABS 3. Doxy-Caps CAPS 4. Levaquin TABS 5. Sulfa Drugs 6. Zithromax CAPS  Family History Problems  1. Family history of Colon Cancer : Brother 2. Family history of Family Health Status Number Of Children   1 son; 2 daughters 3. Family history of Heart Disease : Father 4. Family history of Heart Disease : Brother 5. Family history of Heart Disease : Sister  Social History Problems  1. Alcohol Use (History)   very seldom 2. Caffeine Use   drink hot tea daily 3. Former smoker [phone]) 4. Marital History - Currently Married 5. Occupation:   retired 37. Tobacco Use   quit smoking in 1980.      Vitals Vital Signs  Blood Pressure: 121 / 77 Temperature: 98.7 F Heart Rate: 72  Review of Systems Genitourinary, constitutional, skin, eye, otolaryngeal, hematologic/lymphatic, cardiovascular, pulmonary, endocrine, musculoskeletal, gastrointestinal, neurological and psychiatric system(s) were reviewed and pertinent findings if present are noted.     Physical Exam Constitutional: Well nourished and well developed . No acute distress. The patient appears well hydrated.  ENT:. The ears and nose are normal in appearance.  Neck: The appearance of the neck is normal.  Pulmonary: No respiratory distress.  Cardiovascular: Heart rate and rhythm are normal.  Abdomen: The abdomen is obese. The abdomen is soft and nontender. No suprapubic tenderness.  Rectal: Rectal exam demonstrates normal sphincter tone, the anus is normal on inspection. and no tenderness. The prostate is smooth and flat. The perineum is normal on  inspection, no perineal tenderness.  Genitourinary: Examination of the penis demonstrates a normal meatus. The scrotum is normal in appearance. The right testis is palpably normal, not enlarged and non-tender. The left testis is normal, not enlarged and non-tender.  Skin: Normal skin turgor and normal skin color  and pigmentation.  Neuro/Psych:. Mood and affect are appropriate.     Assessment   I went over the results of his pathology with him today. It has revealed transitional cell carcinoma that is high-grade and has exhibited invasion through the lamina propria/muscularis mucosa but not into the muscularis propria/detrusor muscle. We discussed the options moving forward and I first discussed with him the standard of care which would be a repeat resection of the area to first be sure all of the tumor that was present has been excised. He had a lot of trabeculation and this can produce small pockets that may harbor cancer cells. We also discussed the fact that a repeat resection allows further muscularis propria to be obtained for analysis in order to rule out invasive/T2 disease. I told him that the postoperative mitomycin-C would potentially reduce the risk of recurrence elsewhere in the bladder is not sufficient treatment based on his pathology. Fortunately there was no evidence of lymphovascular invasion.  The other option would be observation however this carries a high probability of recurrence. We did discuss briefly the fact that radical cystectomy is a treatment option that is discussed in this situation however I do not believe that that would be my recommendation for him at this time.   Finally if muscle invasive disease is identified at that time the options would then be radical cystectomy versus sensitizing chemotherapy with radiation. If, on the other hand, there is no evidence of muscle invasive disease then an induction course of BCG would be indicated.      He has elected to proceed with repeat biopsy/resection of his tumor bed in 6 weeks to allow for healing.     Plan  1. I gave him samples of Rapaflo.  2. He is scheduled for repeat biopsy/resection of the tumor bed.

## 2015-04-22 NOTE — Discharge Instructions (Signed)
Cystoscopy patient instructions  Following a cystoscopy, You may have bloody urine for two to three days (Call your doctor if the amount of bleeding increases or does not subside).  You may pass blood clots in your urine, especially if you had a biopsy. It is not unusual to pass small blood clots and have some bloody urine a couple of weeks after your cystoscopy. Again, call your doctor if the bleeding does not subside. You may have: Dysuria (painful urination) Frequency (urinating often) Urgency (strong desire to urinate)  These symptoms are common especially if medicine is instilled into the bladder or a ureteral stent is placed. Avoiding alcohol and caffeine, such as coffee, tea, and chocolate, may help relieve these symptoms. Drink plenty of water, unless otherwise instructed. Your doctor may also prescribe an antibiotic or other medicine to reduce these symptoms.  Cystoscopy results are available soon after the procedure; biopsy results usually take two to four days. Your doctor will discuss the results of your exam with you. Before you go home, you will be given specific instructions for follow-up care. Special Instructions:   1 You may resume your normal activities.  2 Do not drive or operate machinery if you are taking narcotic pain medicine.  3 Be sure to keep all follow-up appointments with your doctor.    Call Your Doctor If:   You have severe pain  You are unable to urinate  You have a fever over 101  You have severe bleeding         Post Anesthesia Home Care Instructions  Activity: Get plenty of rest for the remainder of the day. A responsible adult should stay with you for 24 hours following the procedure.  For the next 24 hours, DO NOT: -Drive a car -Paediatric nurse -Drink alcoholic beverages -Take any medication unless instructed by your physician -Make any legal decisions or sign important papers.  Meals: Start with liquid foods such as gelatin or soup.  Progress to regular foods as tolerated. Avoid greasy, spicy, heavy foods. If nausea and/or vomiting occur, drink only clear liquids until the nausea and/or vomiting subsides. Call your physician if vomiting continues.  Special Instructions/Symptoms: Your throat may feel dry or sore from the anesthesia or the breathing tube placed in your throat during surgery. If this causes discomfort, gargle with warm salt water. The discomfort should disappear within 24 hours.

## 2015-04-23 ENCOUNTER — Encounter (HOSPITAL_BASED_OUTPATIENT_CLINIC_OR_DEPARTMENT_OTHER): Payer: Self-pay | Admitting: Urology

## 2015-04-29 DIAGNOSIS — D494 Neoplasm of unspecified behavior of bladder: Secondary | ICD-10-CM | POA: Diagnosis not present

## 2015-04-29 DIAGNOSIS — C674 Malignant neoplasm of posterior wall of bladder: Secondary | ICD-10-CM | POA: Diagnosis not present

## 2015-04-29 DIAGNOSIS — Z0000 Encounter for general adult medical examination without abnormal findings: Secondary | ICD-10-CM | POA: Diagnosis not present

## 2015-05-08 ENCOUNTER — Encounter: Payer: Self-pay | Admitting: Internal Medicine

## 2015-05-09 NOTE — Telephone Encounter (Signed)
The Urology records have been sent in my name because I was the one who referred him back to Dr. Karsten Ro for the blood in the urine. Tell the patient to contact the Urology office and have them send all records to his PCP (Dr. Gerrit Halls) from now on

## 2015-06-10 DIAGNOSIS — Z0000 Encounter for general adult medical examination without abnormal findings: Secondary | ICD-10-CM | POA: Diagnosis not present

## 2015-06-10 DIAGNOSIS — C674 Malignant neoplasm of posterior wall of bladder: Secondary | ICD-10-CM | POA: Diagnosis not present

## 2015-06-10 DIAGNOSIS — Z5111 Encounter for antineoplastic chemotherapy: Secondary | ICD-10-CM | POA: Diagnosis not present

## 2015-06-17 DIAGNOSIS — C674 Malignant neoplasm of posterior wall of bladder: Secondary | ICD-10-CM | POA: Diagnosis not present

## 2015-06-17 DIAGNOSIS — Z0000 Encounter for general adult medical examination without abnormal findings: Secondary | ICD-10-CM | POA: Diagnosis not present

## 2015-06-17 DIAGNOSIS — Z5111 Encounter for antineoplastic chemotherapy: Secondary | ICD-10-CM | POA: Diagnosis not present

## 2015-06-24 DIAGNOSIS — C674 Malignant neoplasm of posterior wall of bladder: Secondary | ICD-10-CM | POA: Diagnosis not present

## 2015-06-24 DIAGNOSIS — Z0000 Encounter for general adult medical examination without abnormal findings: Secondary | ICD-10-CM | POA: Diagnosis not present

## 2015-06-24 DIAGNOSIS — Z5111 Encounter for antineoplastic chemotherapy: Secondary | ICD-10-CM | POA: Diagnosis not present

## 2015-07-01 DIAGNOSIS — C674 Malignant neoplasm of posterior wall of bladder: Secondary | ICD-10-CM | POA: Diagnosis not present

## 2015-07-01 DIAGNOSIS — Z5111 Encounter for antineoplastic chemotherapy: Secondary | ICD-10-CM | POA: Diagnosis not present

## 2015-07-01 DIAGNOSIS — Z0000 Encounter for general adult medical examination without abnormal findings: Secondary | ICD-10-CM | POA: Diagnosis not present

## 2015-07-08 DIAGNOSIS — C674 Malignant neoplasm of posterior wall of bladder: Secondary | ICD-10-CM | POA: Diagnosis not present

## 2015-07-08 DIAGNOSIS — Z0000 Encounter for general adult medical examination without abnormal findings: Secondary | ICD-10-CM | POA: Diagnosis not present

## 2015-07-18 DIAGNOSIS — Z0000 Encounter for general adult medical examination without abnormal findings: Secondary | ICD-10-CM | POA: Diagnosis not present

## 2015-07-18 DIAGNOSIS — Z5111 Encounter for antineoplastic chemotherapy: Secondary | ICD-10-CM | POA: Diagnosis not present

## 2015-07-18 DIAGNOSIS — C674 Malignant neoplasm of posterior wall of bladder: Secondary | ICD-10-CM | POA: Diagnosis not present

## 2015-08-06 DIAGNOSIS — Z0000 Encounter for general adult medical examination without abnormal findings: Secondary | ICD-10-CM | POA: Diagnosis not present

## 2015-08-06 DIAGNOSIS — Z8551 Personal history of malignant neoplasm of bladder: Secondary | ICD-10-CM | POA: Diagnosis not present

## 2015-08-17 ENCOUNTER — Encounter: Payer: Self-pay | Admitting: Family Medicine

## 2015-08-17 ENCOUNTER — Ambulatory Visit (INDEPENDENT_AMBULATORY_CARE_PROVIDER_SITE_OTHER): Payer: Medicare Other | Admitting: Family Medicine

## 2015-08-17 VITALS — BP 110/70 | HR 77 | Temp 97.7°F | Resp 18 | Ht 66.0 in | Wt 146.0 lb

## 2015-08-17 DIAGNOSIS — K5901 Slow transit constipation: Secondary | ICD-10-CM

## 2015-08-17 DIAGNOSIS — K5900 Constipation, unspecified: Secondary | ICD-10-CM | POA: Insufficient documentation

## 2015-08-17 NOTE — Progress Notes (Signed)
Pre-visit discussion using our clinic review tool. No additional management support is needed unless otherwise documented below in the visit note.  

## 2015-08-17 NOTE — Patient Instructions (Addendum)
Take 2 capfuls of miralax today then take 1 capful of miralax daily, hold for diarrhea.  Continue benefiber.  Consider starting probiotic like phillips colon health or activia daily.  If significant abdominal pain or you stop passing gas or any fever, call us back. Otherwise update [place] on Monday or Tuesday with how you're doing.   Constipation, Adult Constipation is when a person has fewer than three bowel movements a week, has difficulty having a bowel movement, or has stools that are dry, hard, or larger than normal. As people grow older, constipation is more common. A low-fiber diet, not taking in enough fluids, and taking certain medicines may make constipation worse.  CAUSES   Certain medicines, such as antidepressants, pain medicine, iron supplements, antacids, and water pills.   Certain diseases, such as diabetes, irritable bowel syndrome (IBS), thyroid disease, or depression.   Not drinking enough water.   Not eating enough fiber-rich foods.   Stress or travel.   Lack of physical activity or exercise.   Ignoring the urge to have a bowel movement.   Using laxatives too much.  SIGNS AND SYMPTOMS   Having fewer than three bowel movements a week.   Straining to have a bowel movement.   Having stools that are hard, dry, or larger than normal.   Feeling full or bloated.   Pain in the lower abdomen.   Not feeling relief after having a bowel movement.  DIAGNOSIS  Your health care provider will take a medical history and perform a physical exam. Further testing may be done for severe constipation. Some tests may include:  A barium enema X-ray to examine your rectum, colon, and, sometimes, your small intestine.   A sigmoidoscopy to examine your lower colon.   A colonoscopy to examine your entire colon. TREATMENT  Treatment will depend on the severity of your constipation and what is causing it. Some dietary treatments include drinking more fluids and eating  more fiber-rich foods. Lifestyle treatments may include regular exercise. If these diet and lifestyle recommendations do not help, your health care provider may recommend taking over-the-counter laxative medicines to help you have bowel movements. Prescription medicines may be prescribed if over-the-counter medicines do not work.  HOME CARE INSTRUCTIONS   Eat foods that have a lot of fiber, such as fruits, vegetables, whole grains, and beans.  Limit foods high in fat and processed sugars, such as french fries, hamburgers, cookies, candies, and soda.   A fiber supplement may be added to your diet if you cannot get enough fiber from foods.   Drink enough fluids to keep your urine clear or pale yellow.   Exercise regularly or as directed by your health care provider.   Go to the restroom when you have the urge to go. Do not hold it.   Only take over-the-counter or prescription medicines as directed by your health care provider. Do not take other medicines for constipation without talking to your health care provider first.  [employer] IF:   You have bright red blood in your stool.   Your constipation lasts for more than 4 days or gets worse.   You have abdominal or rectal pain.   You have thin, pencil-like stools.   You have unexplained weight loss. MAKE SURE YOU:   Understand these instructions.  Will watch your condition.  Will get help right away if you are not doing well or get worse.   This information is not intended to replace advice given  to you by your health care provider. Make sure you discuss any questions you have with your health care provider.   Document Released: 12/20/2003 Document Revised: 04/13/2014 Document Reviewed: 01/02/2013 Elsevier Interactive Patient Education [employer].

## 2015-08-17 NOTE — Assessment & Plan Note (Signed)
Discussed treatment of constipation - rec miralax 2 capfuls today then one daily, hold for diarrhea. Reviewed high fiber diet, increased water.  No signs of obstruction, discussed reasons that would indicate need for further urgent evaluation.  Pt and wife agree with plan.

## 2015-08-17 NOTE — Progress Notes (Signed)
BP 110/70 mmHg  Pulse 77  Temp(Src) 97.7 F (36.5 C) (Oral)  Resp 18  Ht 5\' 6"  (1.676 m)  Wt 146 lb (66.225 kg)  BMI 23.58 kg/m2  SpO2 91%   CC: constipation  Subjective:    Patient ID: Destined Gariepy, male    DOB: 1929/05/11, 82 y.o.   MRN: [id number]  HPI: Spyros Balko is a 82 y.o. male presenting on 08/17/2015 for Constipation   Chronic bowel issues. Occasionally wtaery stool, predominantly constipation. Last BM was Wednesday - normal consistency (after benefiber and miralax increased at direction of Dr [facility] office). Actually had very small stool this morning.  Normal stool regimen is 1 teaspoon benefiber daily. Normally stools 3x daily, but alternating consistencies. Feels he has good fiber intake and drinks plenty of water. Stays active at the gym.   Overall feels well - denies fever, nausea/vomiting, significant abd pain. Good appetite. Passing gas well. No back pain.  No longer taking norco for pain.  H/o BPH and bladder cancer - latest eval cancer free ([facility]). Completed BCG treatments.  H/o hemorrhoidal banding.   Relevant past medical, surgical, family and social history reviewed and updated as indicated. Interim medical history since our last visit reviewed. Allergies and medications reviewed and updated. Current Outpatient Prescriptions on File Prior to Visit  Medication Sig  . aspirin EC 81 MG tablet Take 81 mg by mouth daily.  [name] desonide (DESOWEN) 0.05 % ointment Apply 1 application topically 2 (two) times daily as needed.  . fexofenadine (ALLEGRA) 180 MG tablet Take 180 mg by mouth daily as needed.   [name] HYDROcodone-acetaminophen (NORCO) 10-325 MG tablet Take 1-2 tablets by mouth every 4 (four) hours as needed for moderate pain. Maximum dose per 24 hours - 8 pills  . naphazoline (CLEAR EYES) 0.012 % ophthalmic solution Place 1 drop into both eyes 2 (two) times daily.   . naproxen sodium (ALEVE) 220 MG tablet Take 220 mg by mouth 2 (two) times daily as  needed.   . phenazopyridine (PYRIDIUM) 200 MG tablet Take 1 tablet (200 mg total) by mouth 3 (three) times daily as needed for pain.  [name] PRESCRIPTION MEDICATION Apply 1 application topically 2 (two) times daily as needed (Cream and Ointment for rash/dry skin from dermatologist.).  [name] triamcinolone (KENALOG) 0.025 % cream Apply 1 application topically 2 (two) times daily as needed.  . Wheat Dextrin (BENEFIBER DRINK MIX PO) Take by mouth. One teaspoon daily   No current facility-administered medications on file prior to visit.    Review of Systems Per HPI unless specifically indicated in ROS section     Objective:    BP 110/70 mmHg  Pulse 77  Temp(Src) 97.7 F (36.5 C) (Oral)  Resp 18  Ht 5\' 6"  (1.676 m)  Wt 146 lb (66.225 kg)  BMI 23.58 kg/m2  SpO2 91%  Wt Readings from Last 3 Encounters:  08/17/15 146 lb (66.225 kg)  04/22/15 145 lb (65.772 kg)  02/25/15 144 lb 8 oz (65.545 kg)    Physical Exam  Constitutional: He appears well-developed and well-nourished. No distress.  HENT:  Mouth/Throat: Oropharynx is clear and moist. No oropharyngeal exudate.  Cardiovascular: Normal rate, regular rhythm, normal heart sounds and intact distal pulses.   No murmur heard. Pulmonary/Chest: Effort normal and breath sounds normal. No respiratory distress. He has no wheezes. He has no rales.  Abdominal: Soft. Normal appearance and bowel sounds are normal. He exhibits no distension and no mass. There is no hepatosplenomegaly. There is  tenderness (mild) in the suprapubic area and left lower quadrant. There is no rebound, no guarding and no CVA tenderness.  Some stool palpated  Musculoskeletal: He exhibits no edema.  Skin: Skin is warm and dry. No rash noted.  Psychiatric: He has a normal mood and affect.  Nursing note and vitals reviewed.     Assessment & Plan:   Problem List Items Addressed This Visit    Constipation - Primary    Discussed treatment of constipation - rec miralax 2 capfuls  today then one daily, hold for diarrhea. Reviewed high fiber diet, increased water.  No signs of obstruction, discussed reasons that would indicate need for further urgent evaluation.  Pt and wife agree with plan.          Follow up plan: No Follow-up on file.  Ria Bush, MD

## 2015-08-19 ENCOUNTER — Telehealth: Payer: Self-pay | Admitting: Family Medicine

## 2015-08-19 NOTE — Telephone Encounter (Signed)
[facility] Night - Client [place] Patient Name: Benjamin Cardenas Gender: Male DOB: December 09, 1929 Age: 82 Y 11 M 13 D Return Phone Number: [phone] (Primary), [phone] (Secondary) Address: City/State/Zip: [place] [address] Client [facility] Night - Client Client Site [facility] - Night Physician Simonne Martinet - MD Contact Type Call Who Is Calling Patient / Member / Family / Caregiver Call Type Triage / Clinical Caller Name Deryck Relationship To Patient Self Return Phone Number [phone] (Primary) Chief Complaint Constipation Reason for Call Symptomatic / Request for Tye states he has not had a bowel movement in the past 5 days. He had been taking 1 teaspoon of Benefiber a day, but they switched him to 2 tablespoons. He also had a bladdar tumor removed in November and not sure if this is related. PreDisposition Call Doctor Translation No Nurse Assessment Nurse: Wynetta Emery, RN, Baker Janus Date/Time Eilene Ghazi Time): 08/17/2015 9:09:39 AM Confirm and document reason for call. If symptomatic, describe symptoms. You must click the next button to save text entered. ---Tressie Ellis is constipated and has not had bm x 4 days liquid discharge then explosive bm -- no large amount of stool taking benefiber Has the patient traveled out of the country within the last 30 days? ---No Does the patient have any new or worsening symptoms? ---Yes Will a triage be completed? ---Yes Related visit to physician within the last 2 weeks? ---No Does the PT have any chronic conditions? (i.e. diabetes, asthma, etc.) ---No Is this a behavioral health or substance abuse call? ---No Guidelines Guideline Title Affirmed Question Affirmed Notes Nurse Date/Time (Eastern Time) Constipation Last bowel movement (BM) > 4 days ago Ivin Booty 08/17/2015  9:11:50 AM Disp. Time Eilene Ghazi Time) Disposition Final User 08/17/2015 9:17:21 AM See Physician within 24 Hours Yes Wynetta Emery, RN, Baker Janus PLEASE NOTE: All timestamps contained within this report are represented as [place] Standard Time. CONFIDENTIALTY NOTICE: This fax transmission is intended only for the addressee. It contains information that is legally privileged, confidential or otherwise protected from use or disclosure. If you are not the intended recipient, you are strictly prohibited from reviewing, disclosing, copying using or disseminating any of this information or taking any action in reliance on or regarding this information. If you have received this fax in error, please notify us immediately by telephone so that we can arrange for its return to [place]. Phone: [phone], Toll-Free: [phone], Fax: [phone] Page: 2 of 2 Call Id: [id number] [place] Understands: Yes Disagree/Comply: Comply Care Advice Given Per Guideline SEE PHYSICIAN WITHIN 24 HOURS: OSMOTIC LAXATIVES: * Severe or increasing abdominal pain * Constant abdominal pain lasting over 2 hours * Vomiting occurs * You become worse. Referrals [facility] [facility]

## 2015-08-19 NOTE — Telephone Encounter (Signed)
FYI.  Pt seen in [facility].

## 2015-08-20 ENCOUNTER — Telehealth: Payer: Self-pay | Admitting: Internal Medicine

## 2015-08-20 NOTE — Telephone Encounter (Signed)
Pt sent to [facility] and was told to take Miralax. He would like a call back he has questions   [phone]

## 2015-08-20 NOTE — Telephone Encounter (Signed)
Spoke to pt, calling to let us know that he moved his bowels good amount today. Told pt okay please continue Miralax and drink plenty of water. Pt verbalized understanding.

## 2015-08-21 ENCOUNTER — Telehealth: Payer: Self-pay | Admitting: Internal Medicine

## 2015-08-21 NOTE — Telephone Encounter (Signed)
Error/njr °

## 2015-10-09 DIAGNOSIS — K5900 Constipation, unspecified: Secondary | ICD-10-CM | POA: Diagnosis not present

## 2015-10-16 DIAGNOSIS — L821 Other seborrheic keratosis: Secondary | ICD-10-CM | POA: Diagnosis not present

## 2015-10-16 DIAGNOSIS — L308 Other specified dermatitis: Secondary | ICD-10-CM | POA: Diagnosis not present

## 2015-10-16 DIAGNOSIS — L820 Inflamed seborrheic keratosis: Secondary | ICD-10-CM | POA: Diagnosis not present

## 2015-10-23 DIAGNOSIS — H2513 Age-related nuclear cataract, bilateral: Secondary | ICD-10-CM | POA: Diagnosis not present

## 2015-11-05 DIAGNOSIS — C674 Malignant neoplasm of posterior wall of bladder: Secondary | ICD-10-CM | POA: Diagnosis not present

## 2015-11-11 DIAGNOSIS — R300 Dysuria: Secondary | ICD-10-CM | POA: Diagnosis not present

## 2015-11-11 DIAGNOSIS — R350 Frequency of micturition: Secondary | ICD-10-CM | POA: Diagnosis not present

## 2015-11-11 DIAGNOSIS — Z8551 Personal history of malignant neoplasm of bladder: Secondary | ICD-10-CM | POA: Diagnosis not present

## 2015-11-18 DIAGNOSIS — R300 Dysuria: Secondary | ICD-10-CM | POA: Diagnosis not present

## 2015-11-18 DIAGNOSIS — C674 Malignant neoplasm of posterior wall of bladder: Secondary | ICD-10-CM | POA: Diagnosis not present

## 2015-11-25 DIAGNOSIS — D494 Neoplasm of unspecified behavior of bladder: Secondary | ICD-10-CM | POA: Diagnosis not present

## 2015-11-25 DIAGNOSIS — Z5111 Encounter for antineoplastic chemotherapy: Secondary | ICD-10-CM | POA: Diagnosis not present

## 2015-12-05 DIAGNOSIS — R350 Frequency of micturition: Secondary | ICD-10-CM | POA: Diagnosis not present

## 2015-12-05 DIAGNOSIS — R300 Dysuria: Secondary | ICD-10-CM | POA: Diagnosis not present

## 2015-12-13 ENCOUNTER — Ambulatory Visit (INDEPENDENT_AMBULATORY_CARE_PROVIDER_SITE_OTHER): Payer: Medicare Other | Admitting: Adult Health

## 2015-12-13 ENCOUNTER — Inpatient Hospital Stay (HOSPITAL_COMMUNITY)
Admission: EM | Admit: 2015-12-13 | Discharge: 2015-12-17 | DRG: 640 | Disposition: A | Discharge status: Home / Self Care | Payer: Medicare Other | Attending: Internal Medicine | Admitting: Internal Medicine

## 2015-12-13 ENCOUNTER — Telehealth: Payer: Self-pay | Admitting: Internal Medicine

## 2015-12-13 ENCOUNTER — Encounter (HOSPITAL_COMMUNITY): Payer: Self-pay

## 2015-12-13 VITALS — BP 106/68 | Temp 97.9°F | Wt 140.8 lb

## 2015-12-13 DIAGNOSIS — R413 Other amnesia: Secondary | ICD-10-CM | POA: Diagnosis not present

## 2015-12-13 DIAGNOSIS — R4189 Other symptoms and signs involving cognitive functions and awareness: Secondary | ICD-10-CM

## 2015-12-13 DIAGNOSIS — Z881 Allergy status to other antibiotic agents status: Secondary | ICD-10-CM

## 2015-12-13 DIAGNOSIS — R05 Cough: Secondary | ICD-10-CM | POA: Diagnosis present

## 2015-12-13 DIAGNOSIS — Z87891 Personal history of nicotine dependence: Secondary | ICD-10-CM | POA: Diagnosis not present

## 2015-12-13 DIAGNOSIS — R350 Frequency of micturition: Secondary | ICD-10-CM | POA: Diagnosis not present

## 2015-12-13 DIAGNOSIS — R339 Retention of urine, unspecified: Secondary | ICD-10-CM | POA: Diagnosis present

## 2015-12-13 DIAGNOSIS — K5900 Constipation, unspecified: Secondary | ICD-10-CM | POA: Diagnosis present

## 2015-12-13 DIAGNOSIS — Z882 Allergy status to sulfonamides status: Secondary | ICD-10-CM | POA: Diagnosis not present

## 2015-12-13 DIAGNOSIS — M79606 Pain in leg, unspecified: Secondary | ICD-10-CM | POA: Diagnosis not present

## 2015-12-13 DIAGNOSIS — Z888 Allergy status to other drugs, medicaments and biological substances status: Secondary | ICD-10-CM

## 2015-12-13 DIAGNOSIS — D649 Anemia, unspecified: Secondary | ICD-10-CM | POA: Diagnosis present

## 2015-12-13 DIAGNOSIS — E871 Hypo-osmolality and hyponatremia: Principal | ICD-10-CM | POA: Diagnosis present

## 2015-12-13 DIAGNOSIS — N400 Enlarged prostate without lower urinary tract symptoms: Secondary | ICD-10-CM | POA: Diagnosis not present

## 2015-12-13 DIAGNOSIS — D630 Anemia in neoplastic disease: Secondary | ICD-10-CM | POA: Diagnosis present

## 2015-12-13 DIAGNOSIS — J302 Other seasonal allergic rhinitis: Secondary | ICD-10-CM | POA: Diagnosis present

## 2015-12-13 DIAGNOSIS — N390 Urinary tract infection, site not specified: Secondary | ICD-10-CM | POA: Diagnosis present

## 2015-12-13 DIAGNOSIS — Z79899 Other long term (current) drug therapy: Secondary | ICD-10-CM | POA: Diagnosis not present

## 2015-12-13 DIAGNOSIS — Z7982 Long term (current) use of aspirin: Secondary | ICD-10-CM | POA: Diagnosis not present

## 2015-12-13 DIAGNOSIS — Z86010 Personal history of colonic polyps: Secondary | ICD-10-CM | POA: Diagnosis not present

## 2015-12-13 DIAGNOSIS — E875 Hyperkalemia: Secondary | ICD-10-CM | POA: Diagnosis present

## 2015-12-13 DIAGNOSIS — R3989 Other symptoms and signs involving the genitourinary system: Secondary | ICD-10-CM | POA: Diagnosis not present

## 2015-12-13 DIAGNOSIS — R4182 Altered mental status, unspecified: Secondary | ICD-10-CM | POA: Diagnosis present

## 2015-12-13 DIAGNOSIS — C679 Malignant neoplasm of bladder, unspecified: Secondary | ICD-10-CM | POA: Diagnosis present

## 2015-12-13 DIAGNOSIS — N3000 Acute cystitis without hematuria: Secondary | ICD-10-CM | POA: Diagnosis not present

## 2015-12-13 DIAGNOSIS — G9341 Metabolic encephalopathy: Secondary | ICD-10-CM | POA: Diagnosis present

## 2015-12-13 DIAGNOSIS — R309 Painful micturition, unspecified: Secondary | ICD-10-CM | POA: Diagnosis not present

## 2015-12-13 DIAGNOSIS — R410 Disorientation, unspecified: Secondary | ICD-10-CM | POA: Diagnosis not present

## 2015-12-13 LAB — COMPREHENSIVE METABOLIC PANEL
ALT: 21 U/L (ref 17–63)
AST: 26 U/L (ref 15–41)
Albumin: 4 g/dL (ref 3.5–5.0)
Alkaline Phosphatase: 77 U/L (ref 38–126)
Anion gap: 7 (ref 5–15)
BUN: 15 mg/dL (ref 6–20)
CALCIUM: 9.4 mg/dL (ref 8.9–10.3)
CHLORIDE: 89 mmol/L — AB (ref 101–111)
CO2: 26 mmol/L (ref 22–32)
CREATININE: 0.96 mg/dL (ref 0.61–1.24)
Glucose, Bld: 106 mg/dL — ABNORMAL HIGH (ref 65–99)
Potassium: 5.3 mmol/L — ABNORMAL HIGH (ref 3.5–5.1)
Sodium: 122 mmol/L — ABNORMAL LOW (ref 135–145)
Total Bilirubin: 1 mg/dL (ref 0.3–1.2)
Total Protein: 6.5 g/dL (ref 6.5–8.1)

## 2015-12-13 LAB — CBC WITH DIFFERENTIAL/PLATELET
BASOS ABS: 0 10*3/uL (ref 0.0–0.1)
BASOS PCT: 0.3 % (ref 0.0–3.0)
Eosinophils Absolute: 0 10*3/uL (ref 0.0–0.7)
Eosinophils Relative: 0 % (ref 0.0–5.0)
HEMATOCRIT: 36 % — AB (ref 39.0–52.0)
Hemoglobin: 12.4 g/dL — ABNORMAL LOW (ref 13.0–17.0)
LYMPHS ABS: 0.5 10*3/uL — AB (ref 0.7–4.0)
MCHC: 34.3 g/dL (ref 30.0–36.0)
MCV: 90.5 fl (ref 78.0–100.0)
MONOS PCT: 5.3 % (ref 3.0–12.0)
Monocytes Absolute: 0.6 10*3/uL (ref 0.1–1.0)
NEUTROS ABS: 9.9 10*3/uL — AB (ref 1.4–7.7)
Neutrophils Relative %: 89.6 % — ABNORMAL HIGH (ref 43.0–77.0)
Platelets: 345 10*3/uL (ref 150.0–400.0)
RBC: 3.98 Mil/uL — ABNORMAL LOW (ref 4.22–5.81)
RDW: 12.9 % (ref 11.5–15.5)
WBC: 11 10*3/uL — ABNORMAL HIGH (ref 4.0–10.5)

## 2015-12-13 LAB — SODIUM, URINE, RANDOM: Sodium, Ur: 17 mmol/L

## 2015-12-13 LAB — CBC
HCT: 34.8 % — ABNORMAL LOW (ref 39.0–52.0)
Hemoglobin: 12.4 g/dL — ABNORMAL LOW (ref 13.0–17.0)
MCH: 30.7 pg (ref 26.0–34.0)
MCHC: 35.6 g/dL (ref 30.0–36.0)
MCV: 86.1 fL (ref 78.0–100.0)
PLATELETS: 350 10*3/uL (ref 150–400)
RBC: 4.04 MIL/uL — ABNORMAL LOW (ref 4.22–5.81)
RDW: 12.2 % (ref 11.5–15.5)
WBC: 9.6 10*3/uL (ref 4.0–10.5)

## 2015-12-13 LAB — BASIC METABOLIC PANEL
BUN: 16 mg/dL (ref 6–23)
CALCIUM: 9.1 mg/dL (ref 8.4–10.5)
CO2: 27 meq/L (ref 19–32)
Chloride: 88 mEq/L — ABNORMAL LOW (ref 96–112)
Creatinine, Ser: 0.87 mg/dL (ref 0.40–1.50)
GFR: 88.8 mL/min (ref >60.00)
GLUCOSE: 120 mg/dL — AB (ref 70–99)
POTASSIUM: 5.6 meq/L — AB (ref 3.5–5.1)
SODIUM: 120 meq/L — AB (ref 135–145)

## 2015-12-13 LAB — URINE MICROSCOPIC-ADD ON: SQUAMOUS EPITHELIAL / LPF: NONE SEEN

## 2015-12-13 LAB — URINALYSIS, ROUTINE W REFLEX MICROSCOPIC
Bilirubin Urine: NEGATIVE
GLUCOSE, UA: NEGATIVE mg/dL
Ketones, ur: NEGATIVE mg/dL
Nitrite: POSITIVE — AB
PROTEIN: 30 mg/dL — AB
Specific Gravity, Urine: 1.008 (ref 1.005–1.030)
pH: 7 (ref 5.0–8.0)

## 2015-12-13 LAB — HEPATIC FUNCTION PANEL
ALK PHOS: 82 U/L (ref 39–117)
ALT: 17 U/L (ref 0–53)
AST: 21 U/L (ref 0–37)
Albumin: 3.8 g/dL (ref 3.5–5.2)
BILIRUBIN DIRECT: 0.2 mg/dL (ref 0.0–0.3)
BILIRUBIN TOTAL: 0.6 mg/dL (ref 0.2–1.2)
Total Protein: 6.2 g/dL (ref 6.0–8.3)

## 2015-12-13 LAB — POC URINALSYSI DIPSTICK (AUTOMATED)
BILIRUBIN UA: NEGATIVE
GLUCOSE UA: NEGATIVE
Ketones, UA: NEGATIVE
Nitrite, UA: POSITIVE
SPEC GRAV UA: 1.01
Urobilinogen, UA: 1
pH, UA: 7

## 2015-12-13 LAB — OSMOLALITY: OSMOLALITY: 262 mosm/kg — AB (ref 275–295)

## 2015-12-13 LAB — MAGNESIUM
MAGNESIUM: 2.4 mg/dL (ref 1.7–2.4)
Magnesium: 2.4 mg/dL (ref 1.5–2.5)

## 2015-12-13 LAB — VITAMIN B12: Vitamin B-12: 668 pg/mL (ref 211–911)

## 2015-12-13 LAB — OSMOLALITY, URINE: Osmolality, Ur: 215 mOsm/kg — ABNORMAL LOW (ref 300–900)

## 2015-12-13 LAB — TSH: TSH: 3.281 u[IU]/mL (ref 0.350–4.500)

## 2015-12-13 LAB — PHOSPHORUS: PHOSPHORUS: 3.9 mg/dL (ref 2.5–4.6)

## 2015-12-13 MED ORDER — TRIMETHOPRIM 100 MG PO TABS
100.0000 mg | ORAL_TABLET | Freq: Two times a day (BID) | ORAL | Status: DC
  Administered 2015-12-14: 100 mg via ORAL
  Filled 2015-12-13 (×2): qty 1

## 2015-12-13 MED ORDER — DEXTROSE 5 % IV SOLN
1.0000 g | Freq: Three times a day (TID) | INTRAVENOUS | Status: DC
  Administered 2015-12-14 (×2): 1 g via INTRAVENOUS
  Filled 2015-12-13 (×4): qty 1

## 2015-12-13 MED ORDER — NAPHAZOLINE-PHENIRAMINE 0.025-0.3 % OP SOLN: 1.0000 [drp] | Freq: Two times a day (BID) | OPHTHALMIC | Status: DC

## 2015-12-13 MED ORDER — PLECANATIDE 3 MG PO TABS
3.0000 mg | ORAL_TABLET | ORAL | Status: DC
  Filled 2015-12-13: qty 1

## 2015-12-13 MED ORDER — PHENAZOPYRIDINE HCL 200 MG PO TABS
200.0000 mg | ORAL_TABLET | Freq: Three times a day (TID) | ORAL | Status: AC
  Administered 2015-12-14 – 2015-12-15 (×6): 200 mg via ORAL
  Filled 2015-12-13 (×6): qty 1

## 2015-12-13 MED ORDER — NAPHAZOLINE-PHENIRAMINE 0.025-0.3 % OP SOLN
1.0000 [drp] | Freq: Two times a day (BID) | OPHTHALMIC | Status: DC
  Administered 2015-12-14 – 2015-12-17 (×8): 1 [drp] via OPHTHALMIC
  Filled 2015-12-13: qty 5

## 2015-12-13 MED ORDER — SODIUM CHLORIDE 0.9 % IV SOLN
INTRAVENOUS | Status: DC
  Administered 2015-12-13 – 2015-12-15 (×3): via INTRAVENOUS

## 2015-12-13 MED ORDER — SODIUM CHLORIDE 0.9 % IV BOLUS (SEPSIS): 500.0000 mL | Freq: Once | INTRAVENOUS | Status: DC

## 2015-12-13 MED ORDER — IBUPROFEN 800 MG PO TABS
800.0000 mg | ORAL_TABLET | Freq: Once | ORAL | Status: AC
  Administered 2015-12-13: 800 mg via ORAL
  Filled 2015-12-13: qty 1

## 2015-12-13 MED ORDER — ENOXAPARIN SODIUM 40 MG/0.4ML ~~LOC~~ SOLN
40.0000 mg | SUBCUTANEOUS | Status: DC
  Administered 2015-12-13 – 2015-12-16 (×4): 40 mg via SUBCUTANEOUS
  Filled 2015-12-13 (×4): qty 0.4

## 2015-12-13 MED ORDER — MELOXICAM 15 MG PO TABS
15.0000 mg | ORAL_TABLET | Freq: Every evening | ORAL | Status: DC
  Administered 2015-12-14 – 2015-12-16 (×4): 15 mg via ORAL
  Filled 2015-12-13 (×5): qty 1

## 2015-12-13 MED ORDER — SODIUM CHLORIDE 0.9 % IV BOLUS (SEPSIS)
500.0000 mL | Freq: Once | INTRAVENOUS | Status: AC
  Administered 2015-12-13: 500 mL via INTRAVENOUS

## 2015-12-13 MED ORDER — SODIUM CHLORIDE 0.9% FLUSH
3.0000 mL | Freq: Two times a day (BID) | INTRAVENOUS | Status: DC
  Administered 2015-12-13 – 2015-12-16 (×3): 3 mL via INTRAVENOUS

## 2015-12-13 MED ORDER — ALFUZOSIN HCL ER 10 MG PO TB24
10.0000 mg | ORAL_TABLET | Freq: Every day | ORAL | Status: DC
  Administered 2015-12-14 – 2015-12-17 (×4): 10 mg via ORAL
  Filled 2015-12-13 (×4): qty 1

## 2015-12-13 MED ORDER — POLYETHYLENE GLYCOL 3350 17 G PO PACK
17.0000 g | PACK | Freq: Every day | ORAL | Status: DC
  Administered 2015-12-14 – 2015-12-15 (×2): 17 g via ORAL
  Filled 2015-12-13 (×2): qty 1

## 2015-12-13 MED ORDER — LORATADINE 10 MG PO TABS: 10.0000 mg | ORAL_TABLET | Freq: Every day | ORAL | Status: DC | PRN

## 2015-12-13 MED ORDER — BISACODYL 10 MG RE SUPP: 10.0000 mg | Freq: Every day | RECTAL | Status: DC | PRN

## 2015-12-13 NOTE — Telephone Encounter (Signed)
Please Advise  Appointment scheduled with [facility] today @ 2:00pm

## 2015-12-13 NOTE — H&P (Signed)
History and Physical    Benjamin Cardenas [id number] DOB: 28-Feb-1930 DOA: 12/13/2015  PCP: Nyoka Cowden, MD   Patient coming from: Home.  Chief Complaint: Abnormal lab result.  HPI: Benjamin Cardenas is a 82 y.o. male with medical history significant of bladder cancer, BPH, prostate nodule, colon polyps, osteoarthritis, seasonal allergies who was sent by his PCP office to the emergency department due to abnormal sodium level.  The patient and his wife state that he has been experiencing lower extremity cramping, dysuria, urinary frequency, confusion, weakness and unsteady gait for the several days. He denies fever, chills, dyspnea, chest pain, palpitations, diaphoresis, pitting edema lower extremities, nausea, emesis or diarrhea. He is stating that he has been constipated since last week.   He went to see his PCP earlier today who ordered some labs and found him to be with a sodium of 120 mmol/liter, potassium 5.6 mmol/liter and urinalysis showing nitrites and positive leukocyte esterase. The patient states that he has been drinking more water than usual, about 2-1/2 quarts of water every day lately.   ED Course: The patient receive a normal saline bolus 500 mL. Workup shows a sodium level of 122 mmol/L, potassium level of 5.3 mmol/liter, hemoglobin level of 12.4 g/dL. urine analysis shows pyuria and bacteriuria.  Review of Systems: As per HPI otherwise 10 point review of systems negative.   Past Medical History:  Diagnosis Date  . Bladder cancer [facility]) urology-- dr Karsten Ro   dx 11/ 2016  Transitional cell carcinoma, High grade invasive , Stage T1  G3 ---  s/p  TURBT and Mitomycin C instillation  . BPH with urinary obstruction   . Dry skin dermatitis   . Elevated PSA   . History of colon polyps   . Hydronephrosis, left   . Lower urinary tract symptoms (LUTS)   . OA (osteoarthritis)   . Prostate nodule   . Seasonal allergic rhinitis   . Wears glasses     Past Surgical  History:  Procedure Laterality Date  . CYSTOSCOPY WITH BIOPSY N/A 04/22/2015   Procedure: CYSTOSCOPY WITH BLADDER BIOPSY;  Surgeon: Kathie Rhodes, MD;  Location: [facility];  Service: Urology;  Laterality: N/A;  . CYSTOSCOPY WITH RETROGRADE PYELOGRAM, URETEROSCOPY AND STENT PLACEMENT Left 02/25/2015   Procedure: LEFT RETROGRADE PYELOGRAM, URETEROSCOPY ;  Surgeon: Kathie Rhodes, MD;  Location[facility];  Service: Urology;  Laterality: Left;  . HERNIA REPAIR  age 70 1937   rupture right inguinal hernia  . INGUINAL HERNIA REPAIR  03/02/2011   Procedure: HERNIA REPAIR INGUINAL ADULT;  Surgeon: Earnstine Regal, MD;  Location: [facility];  Service: General;  Laterality: Left;  with mesh   . TRANSURETHRAL RESECTION OF BLADDER TUMOR WITH GYRUS (TURBT-GYRUS) N/A 02/25/2015   Procedure: TRANSURETHRAL RESECTION OF BLADDER TUMOR WITH GYRUS (TURBT-GYRUS);  Surgeon: Kathie Rhodes, MD;  Location: [facility];  Service: Urology;  Laterality: N/A;     reports that he quit smoking about 36 years ago. His smoking use included Cigarettes. He has a 51.00 pack-year smoking history. He has never used smokeless tobacco. He reports that he drinks alcohol. He reports that he does not use drugs.  Allergies  Allergen Reactions  . Sulfa Antibiotics Swelling  . Amoxicillin Rash  . Ciprofloxacin Swelling and Rash  . Doxycycline Hyclate Rash  . Levofloxacin Rash    Family History  Problem Relation Age of Onset  . Heart disease Mother   . Heart disease Father   .  Cancer Sister     breast  . Cancer Brother     colon    Prior to Admission medications   Medication Sig Start Date End Date Taking? Authorizing Provider  aspirin EC 81 MG tablet Take 81 mg by mouth daily.    Historical Provider, MD  desonide (DESOWEN) 0.05 % ointment Apply 1 application topically 2 (two) times daily as needed.    Historical Provider, MD  fexofenadine (ALLEGRA) 180 MG tablet Take 180 mg by mouth  daily as needed.     Historical Provider, MD  HYDROcodone-acetaminophen (NORCO) 10-325 MG tablet Take 1-2 tablets by mouth every 4 (four) hours as needed for moderate pain. Maximum dose per 24 hours - 8 pills 04/22/15   Kathie Rhodes, MD  naphazoline (CLEAR EYES) 0.012 % ophthalmic solution Place 1 drop into both eyes 2 (two) times daily.     Historical Provider, MD  naproxen sodium (ALEVE) 220 MG tablet Take 220 mg by mouth 2 (two) times daily as needed.     Historical Provider, MD  phenazopyridine (PYRIDIUM) 200 MG tablet Take 1 tablet (200 mg total) by mouth 3 (three) times daily as needed for pain. 04/22/15   Kathie Rhodes, MD  PRESCRIPTION MEDICATION Apply 1 application topically 2 (two) times daily as needed (Cream and Ointment for rash/dry skin from dermatologist.).    Historical Provider, MD  triamcinolone (KENALOG) 0.025 % cream Apply 1 application topically 2 (two) times daily as needed.    Historical Provider, MD  Wheat Dextrin (BENEFIBER DRINK MIX PO) Take by mouth. One teaspoon daily    Historical Provider, MD    Physical Exam: Vitals:   12/13/15 1809  BP: (!) 107/51  Pulse: 92  Resp: 20  Temp: 99.2 F (37.3 C)  TempSrc: Oral  SpO2: 96%      Constitutional: NAD, calm, comfortable Vitals:   12/13/15 1809  BP: (!) 107/51  Pulse: 92  Resp: 20  Temp: 99.2 F (37.3 C)  TempSrc: Oral  SpO2: 96%   Eyes: PERRL, lids and conjunctivae normal ENMT: Mucous membranes are moist. Posterior pharynx clear of any exudate or lesions.Dentition shows age related signs wear and previous dental repair work.  Neck: normal, supple, no masses, no thyromegaly Respiratory: clear to auscultation bilaterally, no wheezing, no crackles. Normal respiratory effort. No accessory muscle use.  Cardiovascular: Regular rate and rhythm, no murmurs / rubs / gallops. No extremity edema. 2+ pedal pulses. No carotid bruits.  Abdomen: no tenderness, no masses palpated. No hepatosplenomegaly. Bowel sounds  positive.  Musculoskeletal: no clubbing / cyanosis. No joint deformity upper and lower extremities. Good ROM, no contractures. Normal muscle tone.  Skin: no rashes, lesions, ulcers. No induration Neurologic: CN 2-12 grossly intact. Sensation intact, DTR normal. Strength 5/5 in all 4.  Psychiatric: Awake, alert, oriented 4, but slow to remember some of the information.   Labs on Admission: I have personally reviewed following labs and imaging studies  CBC:  Recent Labs Lab 12/13/15 1446 12/13/15 1949  WBC 11.0* 9.6  NEUTROABS 9.9*  --   HGB 12.4* 12.4*  HCT 36.0* 34.8*  MCV 90.5 86.1  PLT 345.0 161   Basic Metabolic Panel:  Recent Labs Lab 12/13/15 1446 12/13/15 1949  NA 120* 122*  K 5.6* 5.3*  CL 88* 89*  CO2 27 26  GLUCOSE 120* 106*  BUN 16 15  CREATININE 0.87 0.96  CALCIUM 9.1 9.4  MG 2.4  --    GFR: Estimated Creatinine Clearance: 51.7 mL/min (by C-G  formula based on SCr of 0.96 mg/dL). Liver Function Tests:  Recent Labs Lab 12/13/15 1446 12/13/15 1949  AST 21 26  ALT 17 21  ALKPHOS 82 77  BILITOT 0.6 1.0  PROT 6.2 6.5  ALBUMIN 3.8 4.0     Recent Labs  12/13/15 1446  VITAMINB12 668   Urine analysis:    Component Value Date/Time   COLORURINE YELLOW 07/11/2012 0200   APPEARANCEUR CLEAR 07/11/2012 0200   LABSPEC 1.008 07/11/2012 0200   PHURINE 7.0 07/11/2012 0200   GLUCOSEU NEGATIVE 07/11/2012 0200   HGBUR NEGATIVE 07/11/2012 0200   BILIRUBINUR n 12/13/2015 1510   KETONESUR NEGATIVE 07/11/2012 0200   PROTEINUR 2+ 12/13/2015 1510   PROTEINUR NEGATIVE 07/11/2012 0200   UROBILINOGEN 1.0 12/13/2015 1510   UROBILINOGEN 0.2 07/11/2012 0200   NITRITE positive 12/13/2015 1510   NITRITE NEGATIVE 07/11/2012 0200   LEUKOCYTESUR large (3+) (A) 12/13/2015 1510    EKG: Independently reviewed.  Vent. rate 79 BPM PR interval * ms QRS duration 100 ms QT/QTc 369/423 ms P-R-T axes 81 54 53 Sinus rhythm Baseline wander in lead(s) II III  aVF  Assessment/Plan Principal Problem:   Hyponatremia Admit to telemetry/inpatient. Continue normal saline infusion. Check serum and urine osmolality. Check random urine sodium level. Follow-up sodium level in the morning. Expand workup if no improvement.  Active Problems:   Urinary tract infection Aztreonam per pharmacy. Follow-up urine culture and sensitivity.    Altered mental status Secondary to hyponatremia and UTI. Already improving per wife. Continue current treatment.    Hypokalemia Continue IV hydration. Follow-up potassium level in the morning.    BPH (benign prostatic hyperplasia)   Bladder cancer Currently getting BCG treatment. Continue trimethoprim per urology. Continue treatment and follow-ups per urology.    Anemia Monitor H&H. Follow-up anemia panel.    Constipation. Continue Trulance. Continue MiraLAX.       DVT prophylaxis: Lovenox SQ. Code Status: Full code. Family Communication: His wife was present in the room. Disposition Plan: Admit for IV fluids and IV antibiotics for several days.  Consults called: Admission status: Inpatient/telemetry.   Reubin Milan MD [facility] Pager [phone].  If 7PM-7AM, please contact night-coverage [email or web address] Password [email or web address]  12/13/2015, 8:40 PM

## 2015-12-13 NOTE — Patient Instructions (Signed)
It was great meeting you today   I will follow up with you regarding your blood work.   Please follow up with oncology to discuss side effects of your chemotherapy treatment  Get O'Keefe's working hands cream - this can be found at any drug store

## 2015-12-13 NOTE — Progress Notes (Signed)
Pre visit review using our clinic review tool, if applicable. No additional management support is needed unless otherwise documented below in the visit note. 

## 2015-12-13 NOTE — ED Triage Notes (Addendum)
PT HAD LAB WORK DRAWN BY PCP TODAY AT 2PM. THE OFFICE CALLED AND TOLD THE PT TO COME TO BE EVALUATED FOR A NA+ OF 120 AND A UTI. PT C/O CONFUSION, LEG PAIN, AND UNSTEADY GAIT FOR A FEW DAYS.

## 2015-12-13 NOTE — Progress Notes (Signed)
Subjective:    Patient ID: Benjamin Cardenas, male    DOB: 08/28/1929, 82 y.o.   MRN: [id number]  HPI   81 year old male, patient of Dr. Raliegh Ip. I am seeing him today for an acute issue. He  has a past medical history of Bladder cancer [facility]) (urology-- dr Karsten Ro); BPH with urinary obstruction; Dry skin dermatitis; Elevated PSA; History of colon polyps; Hydronephrosis, left; Lower urinary tract symptoms (LUTS); OA (osteoarthritis); Prostate nodule; Seasonal allergic rhinitis; and Wears glasses. He is currently being treated for bladder cancer by BCG. His wife is with him at this visit and helps provide history.   She reports that Benjamin Cardenas has been confused for the last 6 weeks but the confusion has been getting worse over the last few weeks. She reports that he has trouble remembering tasks and often repeats himself.   They report that [facility] has checked him twice for UTI's due to dysuria and frequency, the last being September 1, she reports no UTI on both instances but he was started on Pyridium  Additionally he is complaining of intermittent episodes of lower extremity pain. The pain is described as " someone shooting a hole in my leg." The pain is sometimes in the calf muscles and other times near the shin. He reports that stretching his legs helps relieve some of the pain. He does report some intermittent edema of his lower extremity as well.  He and his wife are not sure if his symptoms are from chemotherapy treatment or if they are related to something other than that.    Review of Systems  Constitutional: Positive for activity change, appetite change and fatigue. Negative for chills and fever.  HENT: Negative.   Respiratory: Negative.   Cardiovascular: Positive for leg swelling.  Gastrointestinal: Positive for constipation. Negative for abdominal distention, abdominal pain, anal bleeding, blood in stool and nausea.  Genitourinary: Positive for decreased urine volume, difficulty  urinating, dysuria, frequency and urgency. Negative for flank pain.  Neurological: Negative.   Psychiatric/Behavioral: Positive for confusion and decreased concentration. Negative for agitation, behavioral problems, dysphoric mood and hallucinations.  All other systems reviewed and are negative.  Past Medical History:  Diagnosis Date  . Bladder cancer [facility]) urology-- dr Karsten Ro   dx 11/ 2016  Transitional cell carcinoma, High grade invasive , Stage T1  G3 ---  s/p  TURBT and Mitomycin C instillation  . BPH with urinary obstruction   . Dry skin dermatitis   . Elevated PSA   . History of colon polyps   . Hydronephrosis, left   . Lower urinary tract symptoms (LUTS)   . OA (osteoarthritis)   . Prostate nodule   . Seasonal allergic rhinitis   . Wears glasses     Social History   Social History  . Marital status: Married    Spouse name: N/A  . Number of children: N/A  . Years of education: N/A   Occupational History  . Not on file.   Social History Main Topics  . Smoking status: Former Smoker    Packs/day: 1.50    Years: 34.00    Types: Cigarettes    Quit date: 02/17/1979  . Smokeless tobacco: Never Used  . Alcohol use Yes     Comment: occasional  . Drug use: No  . Sexual activity: Not on file   Other Topics Concern  . Not on file   Social History Narrative  . No narrative on file    Past Surgical History:  Procedure Laterality Date  . CYSTOSCOPY WITH BIOPSY N/A 04/22/2015   Procedure: CYSTOSCOPY WITH BLADDER BIOPSY;  Surgeon: Kathie Rhodes, MD;  Location: [facility];  Service: Urology;  Laterality: N/A;  . CYSTOSCOPY WITH RETROGRADE PYELOGRAM, URETEROSCOPY AND STENT PLACEMENT Left 02/25/2015   Procedure: LEFT RETROGRADE PYELOGRAM, URETEROSCOPY ;  Surgeon: Kathie Rhodes, MD;  Location: [facility];  Service: Urology;  Laterality: Left;  . HERNIA REPAIR  age 16 1937   rupture right inguinal hernia  . INGUINAL HERNIA REPAIR  03/02/2011    Procedure: HERNIA REPAIR INGUINAL ADULT;  Surgeon: Earnstine Regal, MD;  Location: [facility];  Service: General;  Laterality: Left;  with mesh   . TRANSURETHRAL RESECTION OF BLADDER TUMOR WITH GYRUS (TURBT-GYRUS) N/A 02/25/2015   Procedure: TRANSURETHRAL RESECTION OF BLADDER TUMOR WITH GYRUS (TURBT-GYRUS);  Surgeon: Kathie Rhodes, MD;  Location: [facility];  Service: Urology;  Laterality: N/A;    Family History  Problem Relation Age of Onset  . Heart disease Mother   . Heart disease Father   . Cancer Sister     breast  . Cancer Brother     colon    Allergies  Allergen Reactions  . Sulfa Antibiotics Swelling  . Amoxicillin Rash  . Ciprofloxacin Swelling and Rash  . Doxycycline Hyclate Rash  . Levofloxacin Rash    Current Outpatient Prescriptions on File Prior to Visit  Medication Sig Dispense Refill  . aspirin EC 81 MG tablet Take 81 mg by mouth daily.    [name] desonide (DESOWEN) 0.05 % ointment Apply 1 application topically 2 (two) times daily as needed.    . fexofenadine (ALLEGRA) 180 MG tablet Take 180 mg by mouth daily as needed.     [name] HYDROcodone-acetaminophen (NORCO) 10-325 MG tablet Take 1-2 tablets by mouth every 4 (four) hours as needed for moderate pain. Maximum dose per 24 hours - 8 pills 18 tablet 0  . naphazoline (CLEAR EYES) 0.012 % ophthalmic solution Place 1 drop into both eyes 2 (two) times daily.     . naproxen sodium (ALEVE) 220 MG tablet Take 220 mg by mouth 2 (two) times daily as needed.     . phenazopyridine (PYRIDIUM) 200 MG tablet Take 1 tablet (200 mg total) by mouth 3 (three) times daily as needed for pain. 20 tablet 0  . PRESCRIPTION MEDICATION Apply 1 application topically 2 (two) times daily as needed (Cream and Ointment for rash/dry skin from dermatologist.).    [name] triamcinolone (KENALOG) 0.025 % cream Apply 1 application topically 2 (two) times daily as needed.    . Wheat Dextrin (BENEFIBER DRINK MIX PO) Take by mouth. One teaspoon daily     No  current facility-administered medications on file prior to visit.     BP 106/68 (BP Location: Left Arm, Patient Position: Sitting, Cuff Size: Normal)   Temp 97.9 F (36.6 C) (Oral)   Wt 140 lb 12.8 oz (63.9 kg)   BMI 22.73 kg/m       Objective:   Physical Exam  Constitutional: He is oriented to person, place, and time. He appears well-developed and well-nourished. No distress.  HENT:  Head: Normocephalic and atraumatic.  Right Ear: External ear normal.  Left Ear: External ear normal.  Nose: Nose normal.  Mouth/Throat: Oropharynx is clear and moist. No oropharyngeal exudate.  Eyes: Conjunctivae and EOM are normal. Pupils are equal, round, and reactive to light. Right eye exhibits no discharge. Left eye exhibits no discharge. No scleral icterus.  Cardiovascular: Normal rate, regular rhythm, normal heart sounds and intact distal pulses.  Exam reveals no gallop and no friction rub.   No murmur heard. Pulmonary/Chest: Effort normal and breath sounds normal. No respiratory distress. He has no wheezes. He has no rales. He exhibits no tenderness.  Abdominal: Soft. Bowel sounds are normal. He exhibits no distension and no mass. There is no tenderness. There is no rebound and no guarding.  Musculoskeletal: Normal range of motion. He exhibits edema (trace edema in bilateral lower extremities). He exhibits no tenderness.  Neurological: He is alert and oriented to person, place, and time.  Skin: Skin is warm and dry. No rash noted. He is not diaphoretic. No erythema. No pallor.  Psychiatric: He has a normal mood and affect. His behavior is normal. Judgment normal.  Maliek was alert and oriented x 4 in the office today for the most part. There were episodes during the exam where he los this through pattern and had a difficult time remembering what he wanted to say. There were also multiple instances on where he repeated himself 2 and 3 times. There was also a single instance where he forgot who the  name of his wife. He appeared discouraged at this.   Nursing note and vitals reviewed.     Assessment & Plan:  1. Cognitive impairment - POCT Urinalysis Dipstick (Automated) - Basic metabolic panel - Magnesium - CBC with Differential/Platelet - Vitamin B12 - Hepatic function panel  2. Pain of lower extremity, unspecified laterality - POCT Urinalysis Dipstick (Automated) - Basic metabolic panel - Magnesium - CBC with Differential/Platelet - Vitamin B12  * Urinalysis was positive for Leukocytes and Nitrates as well as protein and blood.  * Critical result of sodium level of 120. The patient and his wife were called at home and informed of the labs. They were asked to go to the [place] ER for further evaluation. Both were agreeable to this plan.   Charge nurse notified. Patient to come private vehicle.   Dorothyann Peng, NP

## 2015-12-13 NOTE — Progress Notes (Signed)
Pharmacy Antibiotic Note  Benjamin Cardenas is a 82 y.o. male admitted on 12/13/2015 with UTI.  Pharmacy has been consulted for Aztreonam dosing.  Plan: Aztreonam 1gm IV q8h Follow renal function  Height: 5\' 6"  (167.6 cm) Weight: 136 lb 0.4 oz (61.7 kg) IBW/kg (Calculated) : 63.8  Temp (24hrs), Avg:98.4 F (36.9 C), Min:97.9 F (36.6 C), Max:99.2 F (37.3 C)   Recent Labs Lab 12/13/15 1446 12/13/15 1949  WBC 11.0* 9.6  CREATININE 0.87 0.96    Estimated Creatinine Clearance: 50 mL/min (by C-G formula based on SCr of 0.96 mg/dL).    Allergies  Allergen Reactions  . Sulfa Antibiotics Swelling  . Amoxicillin Rash    Has patient had a PCN reaction causing immediate rash, facial/tongue/throat swelling, SOB or lightheadedness with hypotension:No Has patient had a PCN reaction causing severe rash involving mucus membranes or skin necrosis:unsure Has patient had a PCN reaction that required hospitalization:No Has patient had a PCN reaction occurring within the last 10 years:unsure If all of the above answers are "NO", then may proceed with Cephalosporin use.   . Ciprofloxacin Swelling and Rash  . Doxycycline Hyclate Rash  . Levofloxacin Rash    Antimicrobials this admission: 9/9 Aztreonam >>    Dose adjustments this admission:    Microbiology results:  Thank you for allowing pharmacy to be a part of this patient's care.  Everette Rank, PharmD 12/13/2015 11:34 PM

## 2015-12-13 NOTE — Telephone Encounter (Signed)
[facility] Day - Client Jet Patient Name: Benjamin Cardenas DOB: 1930-01-27 Initial Comment Caller states her husband is doing Bacillus Calmette-Guerin (BCG) treatment for bladder cancer, having sharp leg pain and swelling, finger wound, and confusion (call initiated by wife, but patient is the phone) Nurse Assessment Nurse: Benjamin Daft, RN, Sherre Poot Date/Time (Eastern Time): 12/13/2015 10:34:18 AM Confirm and document reason for call. If symptomatic, describe symptoms. You must click the next button to save text entered. ---1) Caller states he is calling d/t sharp pain in left leg. Started in last 2 wks. 2) Finger wound for last 6 wks and skin was broken on knuckle of middle finger, and not closed up but still with red area and now with swelling. 3) Confusion at times as r/t bladder treatments. He is not confused now. Has the patient traveled out of the country within the last 30 days? ---Not Applicable Does the patient have any new or worsening symptoms? ---Yes Will a triage be completed? ---Yes Related visit to physician within the last 2 weeks? ---No Does the PT have any chronic conditions? (i.e. diabetes, asthma, etc.) ---Yes List chronic conditions. ---Bladder CA - taking Bacillus Calmette-Guerin (BCG) treatments (last treatment in the last 2 wks). s/p bladder tumor removed 02/2015. Blood circulation problems and supposed to where TED hose all the time, but he does not. Is this a behavioral health or substance abuse call? ---No Guidelines Guideline Title Affirmed Question Affirmed Notes Confusion - Delirium [1] Acting confused (e.g., disoriented, slurred speech) AND [2] brief (now gone) Leg Pain [1] Thigh, calf, or ankle swelling AND [2] bilateral AND [3] 1 side is more swollen Final Disposition User See Physician within 4 Hours (or PCP triage) Benjamin Daft, RN, Windy Comments Spoke with wife and she notices during  the night when he wakes up to go to bathroom and is headed in the wrong direction, and she has to redirect him. During the day at times, he has to ask her what the day is. 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 Last checked for UTI last week at Urology office and was negative for UTI and started on Pyridium. Bladder has been more irritated. Comments More difficulty reading small print lately. Loosing 5-6 lbs in last week. Appt made with Dorothyann Peng NP at 2 pm today as PCP does not have any available appts. Referrals REFERRED TO PCP OFFICE Disagree/Comply: Comply Disagree/Comply: Comply

## 2015-12-13 NOTE — ED Provider Notes (Signed)
[facility] DEPT Provider Note   CSN: [id number] Arrival date & time: 12/13/15  1746     History   Chief Complaint Chief Complaint  Patient presents with  . Abnormal Lab    LOW NA+    HPI Benjamin Cardenas is a 82 y.o. male.  HPI   Benjamin Cardenas is a 82 y.o. male with PMH significant for bladder cancer currently receiving treatment with [facility], Dr.Ottelin who presents with abnormal lab.  Patient was at PCP office today for increasing confusion, leg cramps, dysuria, urinary frequency, and unsteadiness on his feet.  He is currently taking Pyridium.  Labs were obtained and Na 120, K 5.6, and urine dipstick showed large leukocytes and positive nitrite.  He denies any CP or abdominal pain.  No aggravating or alleviating factors.   Past Medical History:  Diagnosis Date  . Bladder cancer [facility]) urology-- dr Karsten Ro   dx 11/ 2016  Transitional cell carcinoma, High grade invasive , Stage T1  G3 ---  s/p  TURBT and Mitomycin C instillation  . BPH with urinary obstruction   . Dry skin dermatitis   . Elevated PSA   . History of colon polyps   . Hydronephrosis, left   . Lower urinary tract symptoms (LUTS)   . OA (osteoarthritis)   . Prostate nodule   . Seasonal allergic rhinitis   . Wears glasses     Patient Active Problem List   Diagnosis Date Noted  . Hyponatremia 12/13/2015  . Constipation 08/17/2015  . Memory impairment 12/04/2014  . Inguinal hernia unilateral, non-recurrent, left 01/28/2011  . OTHER SYMPTOMS INVOLVING CARDIOVASCULAR SYSTEM 09/20/2009  . Osteoarthritis 01/31/2009  . NECK PAIN 02/15/2007  . BENIGN PROSTATIC HYPERTROPHY 01/06/2007  . History of colonic polyps 01/06/2007    Past Surgical History:  Procedure Laterality Date  . CYSTOSCOPY WITH BIOPSY N/A 04/22/2015   Procedure: CYSTOSCOPY WITH BLADDER BIOPSY;  Surgeon: Kathie Rhodes, MD;  Location: [facility];  Service: Urology;  Laterality: N/A;  . CYSTOSCOPY WITH RETROGRADE  PYELOGRAM, URETEROSCOPY AND STENT PLACEMENT Left 02/25/2015   Procedure: LEFT RETROGRADE PYELOGRAM, URETEROSCOPY ;  Surgeon: Kathie Rhodes, MD;  [facility];  Service: Urology;  Laterality: Left;  . HERNIA REPAIR  age 10 1937   rupture right inguinal hernia  . INGUINAL HERNIA REPAIR  03/02/2011   Procedure: HERNIA REPAIR INGUINAL ADULT;  Surgeon: Earnstine Regal, MD;  Location: [facility];  Service: General;  Laterality: Left;  with mesh   . TRANSURETHRAL RESECTION OF BLADDER TUMOR WITH GYRUS (TURBT-GYRUS) N/A 02/25/2015   Procedure: TRANSURETHRAL RESECTION OF BLADDER TUMOR WITH GYRUS (TURBT-GYRUS);  Surgeon: Kathie Rhodes, MD;  Location[facility];  Service: Urology;  Laterality: N/A;       Home Medications    Prior to Admission medications   Medication Sig Start Date End Date Taking? Authorizing Provider  aspirin EC 81 MG tablet Take 81 mg by mouth daily.    Historical Provider, MD  desonide (DESOWEN) 0.05 % ointment Apply 1 application topically 2 (two) times daily as needed.    Historical Provider, MD  fexofenadine (ALLEGRA) 180 MG tablet Take 180 mg by mouth daily as needed.     Historical Provider, MD  HYDROcodone-acetaminophen (NORCO) 10-325 MG tablet Take 1-2 tablets by mouth every 4 (four) hours as needed for moderate pain. Maximum dose per 24 hours - 8 pills 04/22/15   Kathie Rhodes, MD  naphazoline (CLEAR EYES) 0.012 % ophthalmic solution Place 1 drop  into both eyes 2 (two) times daily.     Historical Provider, MD  naproxen sodium (ALEVE) 220 MG tablet Take 220 mg by mouth 2 (two) times daily as needed.     Historical Provider, MD  phenazopyridine (PYRIDIUM) 200 MG tablet Take 1 tablet (200 mg total) by mouth 3 (three) times daily as needed for pain. 04/22/15   Kathie Rhodes, MD  PRESCRIPTION MEDICATION Apply 1 application topically 2 (two) times daily as needed (Cream and Ointment for rash/dry skin from dermatologist.).    Historical Provider, MD    triamcinolone (KENALOG) 0.025 % cream Apply 1 application topically 2 (two) times daily as needed.    Historical Provider, MD  Wheat Dextrin (BENEFIBER DRINK MIX PO) Take by mouth. One teaspoon daily    Historical Provider, MD    Family History Family History  Problem Relation Age of Onset  . Heart disease Mother   . Heart disease Father   . Cancer Sister     breast  . Cancer Brother     colon    Social History Social History  Substance Use Topics  . Smoking status: Former Smoker    Packs/day: 1.50    Years: 34.00    Types: Cigarettes    Quit date: 02/17/1979  . Smokeless tobacco: Never Used  . Alcohol use Yes     Comment: occasional     Allergies   Sulfa antibiotics; Amoxicillin; Ciprofloxacin; Doxycycline hyclate; and Levofloxacin   Review of Systems Review of Systems All other systems negative unless otherwise stated in HPI   Physical Exam Updated Vital Signs BP (!) 107/51 (BP Location: Right Arm)   Pulse 92   Temp 99.2 F (37.3 C) (Oral)   Resp 20   SpO2 96%   Physical Exam  Constitutional: He is oriented to person, place, and time. He appears well-developed and well-nourished.  Non-toxic appearance. He does not have a sickly appearance. He does not appear ill.  HENT:  Head: Normocephalic and atraumatic.  Mouth/Throat: Oropharynx is clear and moist.  Eyes: Conjunctivae are normal. Pupils are equal, round, and reactive to light.  Neck: Normal range of motion. Neck supple.  Cardiovascular: Normal rate and regular rhythm.   Pulmonary/Chest: Effort normal and breath sounds normal. No accessory muscle usage or stridor. No respiratory distress. He has no wheezes. He has no rhonchi. He has no rales.  Abdominal: Soft. Bowel sounds are normal. He exhibits no distension. There is no tenderness. There is no rebound and no guarding.  Musculoskeletal: Normal range of motion.  Lymphadenopathy:    He has no cervical adenopathy.  Neurological: He is alert and  oriented to person, place, and time.  Speech clear without dysarthria. CN grossly intact.  Normal finger to nose without ataxia.  No pronator drift. Normal strength and sensation.  Gait normal without ataxia.   Skin: Skin is warm and dry.  Psychiatric: He has a normal mood and affect. His behavior is normal.     ED Treatments / Results  Labs (all labs ordered are listed, but only abnormal results are displayed) Labs Reviewed  CBC - Abnormal; Notable for the following:       Result Value   RBC 4.04 (*)    Hemoglobin 12.4 (*)    HCT 34.8 (*)    All other components within normal limits  COMPREHENSIVE METABOLIC PANEL - Abnormal; Notable for the following:    Sodium 122 (*)    Potassium 5.3 (*)    Chloride 89 (*)  Glucose, Bld 106 (*)    All other components within normal limits  URINALYSIS, ROUTINE W REFLEX MICROSCOPIC (NOT AT [facility])  OSMOLALITY  OSMOLALITY, URINE  SODIUM, URINE, RANDOM  TSH  MAGNESIUM  PHOSPHORUS    EKG  EKG Interpretation  Date/Time:  Friday December 13 2015 20:32:33 EDT Ventricular Rate:  79 PR Interval:    QRS Duration: 100 QT Interval:  369 QTC Calculation: 423 R Axis:   54 Text Interpretation:  Sinus rhythm Baseline wander in lead(s) II III aVF No significant change since last tracing Confirmed by Winfred Leeds  MD, SAM [phone]) on 12/13/2015 8:38:28 PM       Radiology No results found.  Procedures Procedures (including critical care time)  Medications Ordered in ED Medications  sodium chloride 0.9 % bolus 500 mL (500 mLs Intravenous New Bag/Given 12/13/15 2023)  ibuprofen (ADVIL,MOTRIN) tablet 800 mg (800 mg Oral Given 12/13/15 2034)     Initial Impression / Assessment and Plan / ED Course  I have reviewed the triage vital signs and the nursing notes.  Pertinent labs & imaging results that were available during my care of the patient were reviewed by me and considered in my medical decision making (see chart for details).  Clinical Course    Patient presents with confusion, urinary symptoms, and electrolyte abnormalities.  Found to have Na 120 at K 5.6 at PCP office today.  Urine dipstick also appeared infectious with positive nitrite and large leukocytes.  Symptoms have worsened over the past week.  No focal neurological deficits on exam.  Abdomen is soft and benign.  Low suspicion for CVA.  Will repeat labs and UA.  No leukocytosis.  Na 122, K 5.3.  Patient receiving IVF.  UA pending.  Patient will need admission for sodium repletion and further evaluation.  Case has been discussed with and seen by Dr. Winfred Leeds who agrees with the above plan for admission.  Final Clinical Impressions(s) / ED Diagnoses   Final diagnoses:  Hyperkalemia  Hyponatremia    New Prescriptions New Prescriptions   No medications on file     Vivien Rossetti 12/13/15 2039    Orlie Dakin, MD 12/14/15 [phone]

## 2015-12-13 NOTE — ED Provider Notes (Signed)
History is obtained from patient and frm patient's wife. Patient has been unsteady on his feet for approximately past week and has been more confused he also admits to urinary frequency. Seen by his primary care physician earlier today sent here for further evaluation after abnormal lab work withserum sodium 120 potassium of 5.6   Orlie Dakin, MD 12/13/15 2023

## 2015-12-14 DIAGNOSIS — N390 Urinary tract infection, site not specified: Secondary | ICD-10-CM | POA: Diagnosis present

## 2015-12-14 DIAGNOSIS — N400 Enlarged prostate without lower urinary tract symptoms: Secondary | ICD-10-CM

## 2015-12-14 DIAGNOSIS — N3000 Acute cystitis without hematuria: Secondary | ICD-10-CM

## 2015-12-14 DIAGNOSIS — E871 Hypo-osmolality and hyponatremia: Principal | ICD-10-CM

## 2015-12-14 LAB — COMPREHENSIVE METABOLIC PANEL
ALK PHOS: 65 U/L (ref 38–126)
ALT: 17 U/L (ref 17–63)
ANION GAP: 5 (ref 5–15)
AST: 21 U/L (ref 15–41)
Albumin: 3 g/dL — ABNORMAL LOW (ref 3.5–5.0)
BUN: 14 mg/dL (ref 6–20)
CALCIUM: 8.8 mg/dL — AB (ref 8.9–10.3)
CHLORIDE: 96 mmol/L — AB (ref 101–111)
CO2: 27 mmol/L (ref 22–32)
CREATININE: 0.74 mg/dL (ref 0.61–1.24)
Glucose, Bld: 94 mg/dL (ref 65–99)
Potassium: 4.5 mmol/L (ref 3.5–5.1)
SODIUM: 128 mmol/L — AB (ref 135–145)
Total Bilirubin: 0.7 mg/dL (ref 0.3–1.2)
Total Protein: 5.6 g/dL — ABNORMAL LOW (ref 6.5–8.1)

## 2015-12-14 LAB — BASIC METABOLIC PANEL
ANION GAP: 5 (ref 5–15)
BUN: 15 mg/dL (ref 6–20)
CHLORIDE: 96 mmol/L — AB (ref 101–111)
CO2: 27 mmol/L (ref 22–32)
Calcium: 8.6 mg/dL — ABNORMAL LOW (ref 8.9–10.3)
Creatinine, Ser: 0.78 mg/dL (ref 0.61–1.24)
GFR calc non Af Amer: >60 mL/min (ref >60)
Glucose, Bld: 107 mg/dL — ABNORMAL HIGH (ref 65–99)
Potassium: 4.8 mmol/L (ref 3.5–5.1)
Sodium: 128 mmol/L — ABNORMAL LOW (ref 135–145)

## 2015-12-14 LAB — CBC WITH DIFFERENTIAL/PLATELET
BASOS PCT: 0 %
Basophils Absolute: 0 10*3/uL (ref 0.0–0.1)
EOS ABS: 0.1 10*3/uL (ref 0.0–0.7)
Eosinophils Relative: 2 %
HEMATOCRIT: 32.1 % — AB (ref 39.0–52.0)
HEMOGLOBIN: 11.4 g/dL — AB (ref 13.0–17.0)
Lymphocytes Relative: 13 %
Lymphs Abs: 1 10*3/uL (ref 0.7–4.0)
MCH: 30.9 pg (ref 26.0–34.0)
MCHC: 35.5 g/dL (ref 30.0–36.0)
MCV: 87 fL (ref 78.0–100.0)
Monocytes Absolute: 0.6 10*3/uL (ref 0.1–1.0)
Monocytes Relative: 8 %
NEUTROS ABS: 5.7 10*3/uL (ref 1.7–7.7)
NEUTROS PCT: 77 %
Platelets: 336 10*3/uL (ref 150–400)
RBC: 3.69 MIL/uL — AB (ref 4.22–5.81)
RDW: 12.5 % (ref 11.5–15.5)
WBC: 7.5 10*3/uL (ref 4.0–10.5)

## 2015-12-14 MED ORDER — OXYBUTYNIN CHLORIDE 5 MG PO TABS
5.0000 mg | ORAL_TABLET | Freq: Three times a day (TID) | ORAL | Status: DC | PRN
  Administered 2015-12-14: 5 mg via ORAL
  Filled 2015-12-14: qty 1

## 2015-12-14 MED ORDER — DEXTROSE 5 % IV SOLN
1.0000 g | INTRAVENOUS | Status: DC
  Administered 2015-12-14 – 2015-12-15 (×2): 1 g via INTRAVENOUS
  Filled 2015-12-14 (×2): qty 10

## 2015-12-14 MED ORDER — MAGNESIUM CITRATE PO SOLN
1.0000 | Freq: Once | ORAL | Status: AC
  Administered 2015-12-14: 1 via ORAL
  Filled 2015-12-14: qty 296

## 2015-12-14 NOTE — Progress Notes (Signed)
I&O cath performed per MD order.  Bloody urine returned with moderate amount of "stringy" clots present. Lore Polka, Laurel Dimmer, RN

## 2015-12-14 NOTE — Consult Note (Addendum)
Urology Consult  Consulting MD: Wynelle Cleveland  CC: History of bladder cancer, hyponatremia  HPI: This is a 82 year old male with a history of bladder cancer, patient of Dr. Karsten Ro.  He has a history of high-grade, non-muscle invasive bladder cancer.  He has completed a course of induction BCG earlier this year, and also a recent course of maintenance BCG, 3 weekly treatments, ending on 11/25/2015.  Induction BCG was well tolerated, but his 3 doses of maintenance BCG have been associated with urinary frequency, urgency, dysuria.  2.  Urine cultures from August, 2017  visits were negative for infection.  He has been maintained on trimethoprim.  He does have bothersome issues with his frequency and urgency, which limit his activities of daily living.  He is on alfuzosin for BPH.  He was recently admitted following identification of hyponatremia.  The patient had been confused over the past 2-3 days.  Apparently, recent serum sodium level was 120.  He is admitted for management of this.  He has had no recent fever or chills.  He has been placed on aztreonam for UTI.  PMH: Past Medical History:  Diagnosis Date  . Bladder cancer [facility]) urology-- dr Karsten Ro   dx 11/ 2016  Transitional cell carcinoma, High grade invasive , Stage T1  G3 ---  s/p  TURBT and Mitomycin C instillation  . BPH with urinary obstruction   . Dry skin dermatitis   . Elevated PSA   . History of colon polyps   . Hydronephrosis, left   . Lower urinary tract symptoms (LUTS)   . OA (osteoarthritis)   . Prostate nodule   . Seasonal allergic rhinitis   . Wears glasses     PSH: Past Surgical History:  Procedure Laterality Date  . CYSTOSCOPY WITH BIOPSY N/A 04/22/2015   Procedure: CYSTOSCOPY WITH BLADDER BIOPSY;  Surgeon: Kathie Rhodes, MD;  Location: [facility];  Service: Urology;  Laterality: N/A;  . CYSTOSCOPY WITH RETROGRADE PYELOGRAM, URETEROSCOPY AND STENT PLACEMENT Left 02/25/2015   Procedure: LEFT RETROGRADE  PYELOGRAM, URETEROSCOPY ;  Surgeon: Kathie Rhodes, MD;  Location: [facility];  Service: Urology;  Laterality: Left;  . HERNIA REPAIR  age 65 1937   rupture right inguinal hernia  . INGUINAL HERNIA REPAIR  03/02/2011   Procedure: HERNIA REPAIR INGUINAL ADULT;  Surgeon: Earnstine Regal, MD;  Location: [facility];  Service: General;  Laterality: Left;  with mesh   . TRANSURETHRAL RESECTION OF BLADDER TUMOR WITH GYRUS (TURBT-GYRUS) N/A 02/25/2015   Procedure: TRANSURETHRAL RESECTION OF BLADDER TUMOR WITH GYRUS (TURBT-GYRUS);  Surgeon: Kathie Rhodes, MD;  Location: [facility];  Service: Urology;  Laterality: N/A;    Allergies: Allergies  Allergen Reactions  . Sulfa Antibiotics Swelling  . Amoxicillin Rash    Has patient had a PCN reaction causing immediate rash, facial/tongue/throat swelling, SOB or lightheadedness with hypotension:No Has patient had a PCN reaction causing severe rash involving mucus membranes or skin necrosis:unsure Has patient had a PCN reaction that required hospitalization:No Has patient had a PCN reaction occurring within the last 10 years:unsure If all of the above answers are "NO", then may proceed with Cephalosporin use.   . Ciprofloxacin Swelling and Rash  . Doxycycline Hyclate Rash  . Levofloxacin Rash    Medications: Prescriptions Prior to Admission  Medication Sig Dispense Refill Last Dose  . alfuzosin (UROXATRAL) 10 MG 24 hr tablet Take 10 mg by mouth daily at 12 noon.    12/12/2015 at Unknown  time  . aspirin EC 81 MG tablet Take 81 mg by mouth daily.   on-hold ~1 month  . cetirizine (ZYRTEC) 10 MG tablet Take 10 mg by mouth daily as needed for allergies.   more than a month ago  . meloxicam (MOBIC) 15 MG tablet Take 15 mg by mouth every evening.    12/12/2015 at Unknown time  . naphazoline-pheniramine (NAPHCON-A) 0.025-0.3 % ophthalmic solution Place 1 drop into both eyes 2 (two) times daily.   12/13/2015 at Unknown time  . naproxen sodium  (ALEVE) 220 MG tablet Take 220 mg by mouth 2 (two) times daily as needed (for pain).    unknown  . phenazopyridine (PYRIDIUM) 200 MG tablet Take 1 tablet (200 mg total) by mouth 3 (three) times daily as needed for pain. 20 tablet 0 12/13/2015 at Unknown time  . polyethylene glycol (MIRALAX / GLYCOLAX) packet Take 17-34 g by mouth daily.   12/13/2015 at Unknown time  . trimethoprim (TRIMPEX) 100 MG tablet Take 100 mg by mouth 2 (two) times daily.   12/13/2015 at am only  . TRULANCE 3 MG TABS Take 3 mg by mouth every morning.    12/13/2015 at Unknown time  . Wheat Dextrin (BENEFIBER DRINK MIX PO) Take 5 mLs by mouth daily.    2-3 weeks ago  . HYDROcodone-acetaminophen (NORCO) 10-325 MG tablet Take 1-2 tablets by mouth every 4 (four) hours as needed for moderate pain. Maximum dose per 24 hours - 8 pills (Patient not taking: Reported on 12/13/2015) 18 tablet 0 Not Taking at Unknown time     Social History: Social History   Social History  . Marital status: Married    Spouse name: N/A  . Number of children: N/A  . Years of education: N/A   Occupational History  . Not on file.   Social History Main Topics  . Smoking status: Former Smoker    Packs/day: 1.50    Years: 34.00    Types: Cigarettes    Quit date: 02/17/1979  . Smokeless tobacco: Never Used  . Alcohol use Yes     Comment: occasional  . Drug use: No  . Sexual activity: Not on file   Other Topics Concern  . Not on file   Social History Narrative  . No narrative on file    Family History: Family History  Problem Relation Age of Onset  . Heart disease Mother   . Heart disease Father   . Cancer Sister     breast  . Cancer Brother     colon    Review of Systems: Positive: Urinary frequency, urgency, dysuria, occasional small blood clots in the urine, confusion. Negative: .  A further 10 point review of systems was negative except what is listed in the HPI.  Physical Exam: @VITALS2 @ General: No acute distress.   Awake. Head:  Normocephalic.  Atraumatic. ENT:  EOMI.  Mucous membranes moist Neck:  Supple.  No lymphadenopathy. CV:  S1 present. S2 present. Regular rate. Pulmonary: Equal effort bilaterally.  . Skin:   No visible rash. Extremity: No gross deformity of bilateral upper extremities.  No gross deformity of bilateral lower extremities. Neurologic: Alert. Appropriate mood.  .  Studies:  Recent Labs     12/13/15  1949  12/14/15  0514  HGB  12.4*  11.4*  WBC  9.6  7.5  PLT  350  336    Recent Labs     12/13/15  1949  12/14/15  0514  NA  122*  128*  K  5.3*  4.5  CL  89*  96*  CO2  26  27  BUN  15  14  CREATININE  0.96  0.74  CALCIUM  9.4  8.8*  GFRNONAA  >60  >60  GFRAA  >60  >60     No results for input(s): INR, APTT in the last 72 hours.  Invalid input(s): PT   Invalid input(s): ABG    Assessment:  1.  Hyponatremia-admitted for this, currently managed.  The patient is improving with regards to his mental status.  2.  Non-muscle invasive bladder cancer, recently completed first course of induction BCG.  3.  Dysuria, pyuria, frequency, urgency.  Recent urine culture in our office from 12/05/2015 was no growth.  Plan: 1.  I'm fine with IV antibiotics, but I am fairly certain that he does not have a urinary tract infection, his urinalysis findings are consistent with recent BCG administration.  Okay to stop these if culture is negative.  2.  I will check bladder scans for adequate emptying-if he seems to be doing well with emptying, I'm okay with starting oxybutynin 5 milligrams every 8 hours for his urgency and frequency.  3.  We will continue to follow while he is in the hospital.  4.  I will leave it up to Dr. Karsten Ro, and the patient to discuss whether continue BCG maintenance in the future.  5.  25 minutes were spent face-to-face with the patient and his wife, additionally, 15 minutes spent reviewing patient's old chart from our  office    [phone]

## 2015-12-14 NOTE — Progress Notes (Signed)
Pt voided 120cc "orange" color urine.  Post void residual per bladder scan result 285cc. Notified MD. Stacey Drain

## 2015-12-14 NOTE — Progress Notes (Signed)
PROGRESS NOTE    Benjamin Cardenas  DGU:[id number] DOB: [birth year] DOA: 12/13/2015  PCP: Nyoka Cowden, MD   Brief Narrative:  Benjamin Cardenas is a 82 y.o. male with medical history significant of bladder cancer, BPH, prostate nodule, colon polyps, osteoarthritis, seasonal allergies who was sent by his PCP office to the emergency department due to abnormal sodium level. The patient and his wife state that he has been experiencing lower extremity cramping, dysuria, urinary frequency, confusion, weakness and unsteady gait for the several days. He is stating that he has been constipated since last week.   He went to see his PCP on the day of admission who ordered some labs and found him to be with a sodium of 120 mmol/liter, potassium 5.6 mmol/liter and urinalysis showing nitrites and positive leukocyte esterase. The patient states that he has been drinking more water than usual, about 2-1/2 quarts of water every day lately.   Subjective: No complaints of dysuria today.   Assessment & Plan:   Principal Problem:   Hyponatremia - hold Trimethoprim- limit fluid intake- giving IV NS- Sodium has improved from 120 to 128- will slow infusion down to prevent rapid rise   Active Problems: UTI in setting of bladder cancer - stop trimethoprim - has numerous allergies to antibiotics- Start Rocephin instead of Azactam - have asked for Urology consult    BPH (benign prostatic hyperplasia) - Alfuzosin   Constipation - chronic issue managed by GI - no BM in 1 wk- will give Mag Citrate today   DVT prophylaxis: Lovenox Code Status: Full code Family Communication: wife Disposition Plan: home when stable Consultants:   Urology Procedures:    Antimicrobials:  Anti-infectives    Start     Dose/Rate Route Frequency Ordered Stop   12/14/15 0000  trimethoprim (TRIMPEX) tablet 100 mg  Status:  Discontinued     100 mg Oral 2 times daily 12/13/15 2335 12/14/15 1041   12/13/15 2359   aztreonam (AZACTAM) 1 g in dextrose 5 % 50 mL IVPB     1 g 100 mL/hr over 30 Minutes Intravenous Every 8 hours 12/13/15 2334         Objective: Vitals:   12/13/15 1809 12/13/15 2100 12/13/15 2156 12/14/15 0612  BP: (!) 107/51 112/72 (!) 100/53 109/84  Pulse: 92 78 74 71  Resp: 20 25 17 18   Temp: 99.2 F (37.3 C)  98.2 F (36.8 C) 97.7 F (36.5 C)  TempSrc: Oral  Oral Oral  SpO2: 96% 100% 100% 99%  Weight:   61.7 kg (136 lb 0.4 oz)   Height:   5\' 6"  (1.676 m)     Intake/Output Summary (Last 24 hours) at 12/14/15 1228 Last data filed at 12/14/15 1132  Gross per 24 hour  Intake           838.75 ml  Output             1300 ml  Net          -461.25 ml   Filed Weights   12/13/15 2156  Weight: 61.7 kg (136 lb 0.4 oz)    Examination: General exam: Appears comfortable  HEENT: PERRLA, oral mucosa moist, no sclera icterus or thrush Respiratory system: Clear to auscultation. Respiratory effort normal. Cardiovascular system: S1 & S2 heard, RRR.  No murmurs  Gastrointestinal system: Abdomen soft, non-tender, nondistended. Normal bowel sound. No organomegaly Central nervous system: Alert and oriented. No focal neurological deficits. Extremities: No cyanosis, clubbing or edema Skin: No rashes or  ulcers Psychiatry:  Mood & affect appropriate.     Data Reviewed: I have personally reviewed following labs and imaging studies  CBC:  Recent Labs Lab 12/13/15 1446 12/13/15 1949 12/14/15 0514  WBC 11.0* 9.6 7.5  NEUTROABS 9.9*  --  5.7  HGB 12.4* 12.4* 11.4*  HCT 36.0* 34.8* 32.1*  MCV 90.5 86.1 87.0  PLT 345.0 350 004   Basic Metabolic Panel:  Recent Labs Lab 12/13/15 1446 12/13/15 1949 12/14/15 0514  NA 120* 122* 128*  K 5.6* 5.3* 4.5  CL 88* 89* 96*  CO2 27 26 27   GLUCOSE 120* 106* 94  BUN 16 15 14   CREATININE 0.87 0.96 0.74  CALCIUM 9.1 9.4 8.8*  MG 2.4 2.4  --   PHOS  --  3.9  --    GFR: Estimated Creatinine Clearance: 60 mL/min (by C-G formula based on  SCr of 0.8 mg/dL). Liver Function Tests:  Recent Labs Lab 12/13/15 1446 12/13/15 1949 12/14/15 0514  AST 21 26 21   ALT 17 21 17   ALKPHOS 82 77 65  BILITOT 0.6 1.0 0.7  PROT 6.2 6.5 5.6*  ALBUMIN 3.8 4.0 3.0*   No results for input(s): LIPASE, AMYLASE in the last 168 hours. No results for input(s): AMMONIA in the last 168 hours. Coagulation Profile: No results for input(s): INR, PROTIME in the last 168 hours. Cardiac Enzymes: No results for input(s): CKTOTAL, CKMB, CKMBINDEX, TROPONINI in the last 168 hours. BNP (last 3 results) No results for input(s): PROBNP in the last 8760 hours. HbA1C: No results for input(s): HGBA1C in the last 72 hours. CBG: No results for input(s): GLUCAP in the last 168 hours. Lipid Profile: No results for input(s): CHOL, HDL, LDLCALC, TRIG, CHOLHDL, LDLDIRECT in the last 72 hours. Thyroid Function Tests:  Recent Labs  12/13/15 2049  TSH 3.281   Anemia Panel:  Recent Labs  12/13/15 1446  VITAMINB12 668   Urine analysis:    Component Value Date/Time   COLORURINE AMBER (A) 12/13/2015 2039   APPEARANCEUR CLOUDY (A) 12/13/2015 2039   LABSPEC 1.008 12/13/2015 2039   PHURINE 7.0 12/13/2015 2039   GLUCOSEU NEGATIVE 12/13/2015 2039   HGBUR LARGE (A) 12/13/2015 2039   BILIRUBINUR NEGATIVE 12/13/2015 2039   BILIRUBINUR n 12/13/2015 1510   KETONESUR NEGATIVE 12/13/2015 2039   PROTEINUR 30 (A) 12/13/2015 2039   UROBILINOGEN 1.0 12/13/2015 1510   UROBILINOGEN 0.2 07/11/2012 0200   NITRITE POSITIVE (A) 12/13/2015 2039   LEUKOCYTESUR LARGE (A) 12/13/2015 2039   Sepsis Labs: @LABRCNTIP (procalcitonin:4,lacticidven:4) )No results found for this or any previous visit (from the past 240 hour(s)).       Radiology Studies: No results found.    Scheduled Meds: . alfuzosin  10 mg Oral Q1200  . aztreonam  1 g Intravenous Q8H  . enoxaparin (LOVENOX) injection  40 mg Subcutaneous Q24H  . meloxicam  15 mg Oral QPM  . naphazoline-pheniramine   1 drop Both Eyes BID  . phenazopyridine  200 mg Oral TID WC  . Plecanatide  3 mg Oral [id number]  . polyethylene glycol  17-34 g Oral Daily  . sodium chloride flush  3 mL Intravenous Q12H   Continuous Infusions: . sodium chloride 50 mL/hr at 12/14/15 0920     LOS: 1 day    Time spent in minutes: 65    Harvey, MD [facility] Pager: [email or web address] Password [email or web address] 12/14/2015, 12:28 PM

## 2015-12-15 DIAGNOSIS — R410 Disorientation, unspecified: Secondary | ICD-10-CM

## 2015-12-15 DIAGNOSIS — R413 Other amnesia: Secondary | ICD-10-CM

## 2015-12-15 LAB — BASIC METABOLIC PANEL
ANION GAP: 7 (ref 5–15)
BUN: 14 mg/dL (ref 6–20)
CHLORIDE: 95 mmol/L — AB (ref 101–111)
CO2: 27 mmol/L (ref 22–32)
CREATININE: 0.82 mg/dL (ref 0.61–1.24)
Calcium: 8.5 mg/dL — ABNORMAL LOW (ref 8.9–10.3)
GFR calc non Af Amer: >60 mL/min (ref >60)
Glucose, Bld: 92 mg/dL (ref 65–99)
POTASSIUM: 3.9 mmol/L (ref 3.5–5.1)
SODIUM: 129 mmol/L — AB (ref 135–145)

## 2015-12-15 LAB — URINE CULTURE: Culture: NO GROWTH

## 2015-12-15 MED ORDER — LIDOCAINE HCL 2 % EX GEL
1.0000 "application " | Freq: Once | CUTANEOUS | Status: AC
  Administered 2015-12-15: 1 via URETHRAL
  Filled 2015-12-15: qty 5

## 2015-12-15 MED ORDER — PREMIER PROTEIN SHAKE
11.0000 [oz_av] | Freq: Two times a day (BID) | ORAL | Status: DC
  Administered 2015-12-15 – 2015-12-16 (×2): 11 [oz_av] via ORAL
  Filled 2015-12-15 (×3): qty 325.31

## 2015-12-15 MED ORDER — SODIUM CHLORIDE 1 G PO TABS
1.0000 g | ORAL_TABLET | Freq: Two times a day (BID) | ORAL | Status: DC
  Administered 2015-12-15 (×2): 1 g via ORAL
  Filled 2015-12-15 (×3): qty 1

## 2015-12-15 NOTE — Progress Notes (Addendum)
  Subjective: Patient reports continued problems with urination-frequency and urgency, slow stream.  His post void residual volumes have been fairly high.  Objective: Vital signs in last 24 hours: Temp:  [97.7 F (36.5 C)-98.6 F (37 C)] 98.2 F (36.8 C) (09/10 0428) Pulse Rate:  [71-86] 73 (09/10 0428) Resp:  [18-20] 18 (09/10 0428) BP: (103-110)/(63-84) 110/70 (09/10 0428) SpO2:  [95 %-99 %] 97 % (09/10 0428)  Intake/Output from previous day: 09/09 0701 - 09/10 0700 In: 1476.7 [P.O.:720; I.V.:656.7; IV Piggyback:100] Out: 1616 [Urine:1616] Intake/Output this shift: Total I/O In: -  Out: 275 [Urine:275]  Physical Exam:  Constitutional: Vital signs reviewed. WD WN in NAD  .  Quite anxious Eyes: PERRL, No scleral icterus.   Cardiovascular: RRR Pulmonary/Chest: Normal effort    Lab Results:  Recent Labs  12/13/15 1446 12/13/15 1949 12/14/15 0514  HGB 12.4* 12.4* 11.4*  HCT 36.0* 34.8* 32.1*   BMET  Recent Labs  12/14/15 0514 12/14/15 1334  NA 128* 128*  K 4.5 4.8  CL 96* 96*  CO2 27 27  GLUCOSE 94 107*  BUN 14 15  CREATININE 0.74 0.78  CALCIUM 8.8* 8.6*   No results for input(s): LABPT, INR in the last 72 hours. No results for input(s): LABURIN in the last 72 hours. Results for orders placed or performed in visit on 01/23/15  Urine culture     Status: None   Collection Time: 01/23/15  3:51 PM  Result Value Ref Range Status   Colony Count NO GROWTH  Final   Organism ID, Bacteria NO GROWTH  Final    Studies/Results: No results found.  Assessment/Plan:   1.  BPH/incomplete bladder emptying.  This is causing patient significant difficulty.  I will order Foley catheter to be placed.    2.  Hyponatremia-managed by Hospital service    3.  Pyuria-expected finding after BCG maintenance.  Cultures pending.  Still on antibiotics.    I will have Dr. Karsten Ro follow beginning on Monday   LOS: 2 days   Mosetta Ferdinand M 12/15/2015, 6:00  AM  20 minutes were spent at bedside with patient and wife

## 2015-12-15 NOTE — Progress Notes (Signed)
PROGRESS NOTE    Benjamin Cardenas  [id number]:[id number] DOB: 03/06/1930 DOA: 12/13/2015  PCP: Nyoka Cowden, MD   Brief Narrative:  Benjamin Cardenas is a 82 y.o. male with medical history significant of bladder cancer, BPH, prostate nodule, colon polyps, osteoarthritis, seasonal allergies who was sent by his PCP office to the emergency department due to abnormal sodium level. The patient and his wife state that he has been experiencing lower extremity cramping, dysuria, urinary frequency, confusion, weakness and unsteady gait for the several days. He is stating that he has been constipated since last week.   He went to see his PCP on the day of admission who ordered some labs and found him to be with a sodium of 120 mmol/liter, potassium 5.6 mmol/liter and urinalysis showing nitrites and positive leukocyte esterase. The patient states that he has been drinking more water than usual, about 2-1/2 quarts of water every day lately.   Subjective: No complaints of dysuria today.   Assessment & Plan:   Principal Problem:   Hyponatremia - hold Trimethoprim- limit fluid intake- giving IV NS- Sodium has improved from 120 to 128- will slow infusion down to prevent rapid rise   Active Problems: UTI in setting of bladder cancer - stop trimethoprim - has numerous allergies to antibiotics- Start Rocephin instead of Azactam - have asked for Urology consult    BPH (benign prostatic hyperplasia) - Alfuzosin   Constipation - chronic issue managed by GI - no BM in 1 wk- will give Mag Citrate today   DVT prophylaxis: Lovenox Code Status: Full code Family Communication: wife Disposition Plan: home when stable Consultants:   Urology Procedures:    Antimicrobials:  Anti-infectives    Start     Dose/Rate Route Frequency Ordered Stop   12/14/15 1800  cefTRIAXone (ROCEPHIN) 1 g in dextrose 5 % 50 mL IVPB     1 g 100 mL/hr over 30 Minutes Intravenous Every 24 hours 12/14/15 1421     12/14/15 0000  trimethoprim (TRIMPEX) tablet 100 mg  Status:  Discontinued     100 mg Oral 2 times daily 12/13/15 2335 12/14/15 1041   12/13/15 2359  aztreonam (AZACTAM) 1 g in dextrose 5 % 50 mL IVPB  Status:  Discontinued     1 g 100 mL/hr over 30 Minutes Intravenous Every 8 hours 12/13/15 2334 12/14/15 1421       Objective: Vitals:   12/14/15 0612 12/14/15 1611 12/14/15 2110 12/15/15 0428  BP: 109/84 105/63 103/66 110/70  Pulse: 71 86 71 73  Resp: 18 20 18 18   Temp: 97.7 F (36.5 C) 98 F (36.7 C) 98.6 F (37 C) 98.2 F (36.8 C)  TempSrc: Oral Oral Oral Oral  SpO2: 99% 98% 95% 97%  Weight:      Height:        Intake/Output Summary (Last 24 hours) at 12/15/15 1257 Last data filed at 12/15/15 1020  Gross per 24 hour  Intake          1748.33 ml  Output             2166 ml  Net          -417.67 ml   Filed Weights   12/13/15 2156  Weight: 61.7 kg (136 lb 0.4 oz)    Examination: General exam: Appears comfortable  HEENT: PERRLA, oral mucosa moist, no sclera icterus or thrush Respiratory system: Clear to auscultation. Respiratory effort normal. Cardiovascular system: S1 & S2 heard, RRR.  No murmurs  Gastrointestinal  system: Abdomen soft, non-tender, nondistended. Normal bowel sound. No organomegaly Central nervous system: Alert and oriented. No focal neurological deficits. Extremities: No cyanosis, clubbing or edema Skin: No rashes or ulcers Psychiatry:  Mood & affect appropriate.     Data Reviewed: I have personally reviewed following labs and imaging studies  CBC:  Recent Labs Lab 12/13/15 1446 12/13/15 1949 12/14/15 0514  WBC 11.0* 9.6 7.5  NEUTROABS 9.9*  --  5.7  HGB 12.4* 12.4* 11.4*  HCT 36.0* 34.8* 32.1*  MCV 90.5 86.1 87.0  PLT 345.0 350 151   Basic Metabolic Panel:  Recent Labs Lab 12/13/15 1446 12/13/15 1949 12/14/15 0514 12/14/15 1334 12/15/15 0516  NA 120* 122* 128* 128* 129*  K 5.6* 5.3* 4.5 4.8 3.9  CL 88* 89* 96* 96* 95*  CO2  27 26 27 27 27   GLUCOSE 120* 106* 94 107* 92  BUN 16 15 14 15 14   CREATININE 0.87 0.96 0.74 0.78 0.82  CALCIUM 9.1 9.4 8.8* 8.6* 8.5*  MG 2.4 2.4  --   --   --   PHOS  --  3.9  --   --   --    GFR: Estimated Creatinine Clearance: 58.5 mL/min (by C-G formula based on SCr of 0.82 mg/dL). Liver Function Tests:  Recent Labs Lab 12/13/15 1446 12/13/15 1949 12/14/15 0514  AST 21 26 21   ALT 17 21 17   ALKPHOS 82 77 65  BILITOT 0.6 1.0 0.7  PROT 6.2 6.5 5.6*  ALBUMIN 3.8 4.0 3.0*   No results for input(s): LIPASE, AMYLASE in the last 168 hours. No results for input(s): AMMONIA in the last 168 hours. Coagulation Profile: No results for input(s): INR, PROTIME in the last 168 hours. Cardiac Enzymes: No results for input(s): CKTOTAL, CKMB, CKMBINDEX, TROPONINI in the last 168 hours. BNP (last 3 results) No results for input(s): PROBNP in the last 8760 hours. HbA1C: No results for input(s): HGBA1C in the last 72 hours. CBG: No results for input(s): GLUCAP in the last 168 hours. Lipid Profile: No results for input(s): CHOL, HDL, LDLCALC, TRIG, CHOLHDL, LDLDIRECT in the last 72 hours. Thyroid Function Tests:  Recent Labs  12/13/15 2049  TSH 3.281   Anemia Panel:  Recent Labs  12/13/15 1446  VITAMINB12 668   Urine analysis:    Component Value Date/Time   COLORURINE AMBER (A) 12/13/2015 2039   APPEARANCEUR CLOUDY (A) 12/13/2015 2039   LABSPEC 1.008 12/13/2015 2039   PHURINE 7.0 12/13/2015 2039   GLUCOSEU NEGATIVE 12/13/2015 2039   HGBUR LARGE (A) 12/13/2015 2039   BILIRUBINUR NEGATIVE 12/13/2015 2039   BILIRUBINUR n 12/13/2015 [place] NEGATIVE 12/13/2015 2039   PROTEINUR 30 (A) 12/13/2015 2039   UROBILINOGEN 1.0 12/13/2015 1510   UROBILINOGEN 0.2 07/11/2012 0200   NITRITE POSITIVE (A) 12/13/2015 2039   LEUKOCYTESUR LARGE (A) 12/13/2015 2039   Sepsis Labs: @LABRCNTIP (procalcitonin:4,lacticidven:4) ) Recent Results (from the past 240 hour(s))  Culture,  Urine     Status: None   Collection Time: 12/14/15  1:47 PM  Result Value Ref Range Status   Specimen Description URINE, CLEAN CATCH  Final   Special Requests NONE  Final   Culture NO GROWTH Performed at [facility]   Final   Report Status 12/15/2015 FINAL  Final         Radiology Studies: No results found.    Scheduled Meds: . alfuzosin  10 mg Oral Q1200  . cefTRIAXone (ROCEPHIN)  IV  1 g Intravenous Q24H  .  enoxaparin (LOVENOX) injection  40 mg Subcutaneous Q24H  . meloxicam  15 mg Oral QPM  . naphazoline-pheniramine  1 drop Both Eyes BID  . phenazopyridine  200 mg Oral TID WC  . polyethylene glycol  17-34 g Oral Daily  . sodium chloride flush  3 mL Intravenous Q12H  . sodium chloride  1 g Oral BID WC   Continuous Infusions: . sodium chloride 50 mL/hr at 12/14/15 1720     LOS: 2 days    Time spent in minutes: 47    Cody, MD [facility] Pager: [email or web address] Password [email or web address] 12/15/2015, 12:57 PM

## 2015-12-15 NOTE — Progress Notes (Signed)
Bladder scan performed per MD order. 189 cc PVR in bladder.

## 2015-12-15 NOTE — Progress Notes (Signed)
Initial Nutrition Assessment  DOCUMENTATION CODES:   Severe malnutrition in context of chronic illness  INTERVENTION:   When medically able, recommend liberalization of diet given poor PO intake PTA. Provide Premier Protein supplements, each provides 160 kcal and 30g protein. Encourage small, frequent meals RD to continue to monitor  NUTRITION DIAGNOSIS:   Malnutrition related to chronic illness as evidenced by severe depletion of body fat, severe depletion of muscle mass.  GOAL:   Patient will meet greater than or equal to 90% of their needs  MONITOR:   PO intake, Supplement acceptance, Labs, Weight trends, I & O's  REASON FOR ASSESSMENT:   Malnutrition Screening Tool    ASSESSMENT:   82 y.o. male with medical history significant of bladder cancer, BPH, prostate nodule, colon polyps, osteoarthritis, seasonal allergies who was sent by his PCP office to the emergency department due to abnormal sodium level. The patient and his wife state that he has been experiencing lower extremity cramping, dysuria, urinary frequency, confusion, weakness and unsteady gait for the several days. He is stating that he has been constipated since last week.   Patient in room with wife at bedside. Pt eating 50-100% of meals at this time. Per pt's wife, pt has not been eating well d/t increased confusion. Pt has also had some constipation. PT ate 100% of a salad with [place] and cheese. Pt does not like being on Heart Healthy diet and is asking for some dark chocolate. Recommend if medically able, liberalize diet to aid in pt's PO intake. Pt drinks ~1 Premier Protein at home with ice cream. Will order for pt.   Per weight history, pt has lost 10 lb since 5/13 (7% wt loss x 4 months, insignificant for time frame). Nutrition-Focused physical exam completed. Findings are severe fat depletion, severe muscle depletion, and no edema.   Medications: Miralax packet daily, NaCl tablets BID Labs reviewed: Low  Na -trending up  Diet Order:  Diet Heart Room service appropriate? Yes; Fluid consistency: Thin  Skin:  Reviewed, no issues  Last BM:  9/10  Height:   Ht Readings from Last 1 Encounters:  12/13/15 5\' 6"  (1.676 m)    Weight:   Wt Readings from Last 1 Encounters:  12/13/15 136 lb 0.4 oz (61.7 kg)    Ideal Body Weight:  64.5 kg  BMI:  Body mass index is 21.95 kg/m.  Estimated Nutritional Needs:   Kcal:  1550-1750  Protein:  75-85g  Fluid:  1.8L/day  EDUCATION NEEDS:   Education needs addressed  Clayton Bibles, MS, RD, LDN Pager: [phone] After Hours Pager: [phone]

## 2015-12-15 NOTE — Progress Notes (Signed)
Bladder scan performed per MD order. PVR 362 cc.

## 2015-12-16 LAB — OSMOLALITY, URINE: Osmolality, Ur: 527 mOsm/kg (ref 300–900)

## 2015-12-16 LAB — SODIUM, URINE, RANDOM: SODIUM UR: 32 mmol/L

## 2015-12-16 MED ORDER — GUAIFENESIN ER 600 MG PO TB12
600.0000 mg | ORAL_TABLET | Freq: Two times a day (BID) | ORAL | Status: DC
  Administered 2015-12-16 – 2015-12-17 (×3): 600 mg via ORAL
  Filled 2015-12-16 (×3): qty 1

## 2015-12-16 MED ORDER — PLECANATIDE 3 MG PO TABS: 3.0000 mg | ORAL_TABLET | Freq: Every day | ORAL | Status: DC

## 2015-12-16 MED ORDER — TADALAFIL 5 MG PO TABS
5.0000 mg | ORAL_TABLET | Freq: Every day | ORAL | Status: DC
  Filled 2015-12-16 (×3): qty 1

## 2015-12-16 MED ORDER — PLECANATIDE 3 MG PO TABS
3.0000 mg | ORAL_TABLET | Freq: Every day | ORAL | Status: DC
  Administered 2015-12-17: 3 mg via ORAL

## 2015-12-16 MED ORDER — SODIUM CHLORIDE 1 G PO TABS
1.0000 g | ORAL_TABLET | Freq: Three times a day (TID) | ORAL | Status: DC
  Administered 2015-12-16 – 2015-12-17 (×5): 1 g via ORAL
  Filled 2015-12-16 (×6): qty 1

## 2015-12-16 NOTE — Plan of Care (Signed)
Problem: Skin Integrity: Goal: Risk for impaired skin integrity will decrease Outcome: Completed/Met Date Met: 12/16/15 Pt skin is intact, not at risk

## 2015-12-16 NOTE — Progress Notes (Signed)
PROGRESS NOTE    Benjamin Cardenas  JSE:[id number] DOB: Jun 15, 1929 DOA: 12/13/2015  PCP: Nyoka Cowden, MD   Brief Narrative:  Benjamin Cardenas is a 82 y.o. male with medical history significant of bladder cancer, BPH, prostate nodule, colon polyps, osteoarthritis, seasonal allergies who was sent by his PCP office to the emergency department due to abnormal sodium level. The patient and his wife state that he has been experiencing lower extremity cramping, dysuria, urinary frequency, confusion, weakness and unsteady gait for the several days. He is stating that he has been constipated since last week.   He went to see his PCP on the day of admission who ordered some labs and found him to be with a sodium of 120 mmol/liter, potassium 5.6 mmol/liter and urinalysis showing nitrites and positive leukocyte esterase. The patient states that he has been drinking more water than usual, about 2-1/2 quarts of water every day lately.   Subjective: No complaints of dysuria. Has a new cough with mild congestion in his throat. No dyspnea, no post nasal drip or sore throat.   Assessment & Plan:   Principal Problem:   Hyponatremia - holding Trimethoprim - limit fluid intake- giving IV NS- Sodium has improved from 120 to 129 -cont NaCL tabs as well  Active Problems: UTI in setting of bladder cancer - stop trimethoprim - has numerous allergies to antibiotics- Started Rocephin instead of Azactam- culture remains negative - have asked for Urology consult - Urology has low suspicion for UTI- will d/c Rocephin  Cough/congestion - acute bronchitis? Likely viral - lungs sound clear- add mucinex    BPH (benign prostatic hyperplasia) - Alfuzosin   Constipation - chronic issue managed by GI - no BM in 1 wk-  Resolved with Mag Citrate and Miralax   DVT prophylaxis: Lovenox Code Status: Full code Family Communication: wife Disposition Plan: home when stable Consultants:    Urology Procedures:    Antimicrobials:  Anti-infectives    Start     Dose/Rate Route Frequency Ordered Stop   12/14/15 1800  cefTRIAXone (ROCEPHIN) 1 g in dextrose 5 % 50 mL IVPB  Status:  Discontinued     1 g 100 mL/hr over 30 Minutes Intravenous Every 24 hours 12/14/15 1421 12/16/15 0843   12/14/15 0000  trimethoprim (TRIMPEX) tablet 100 mg  Status:  Discontinued     100 mg Oral 2 times daily 12/13/15 2335 12/14/15 1041   12/13/15 2359  aztreonam (AZACTAM) 1 g in dextrose 5 % 50 mL IVPB  Status:  Discontinued     1 g 100 mL/hr over 30 Minutes Intravenous Every 8 hours 12/13/15 2334 12/14/15 1421       Objective: Vitals:   12/15/15 0428 12/15/15 1339 12/15/15 2231 12/16/15 0632  BP: 110/70 102/60 100/67 120/64  Pulse: 73 71 74 78  Resp: 18 18 18 18   Temp: 98.2 F (36.8 C) 97.9 F (36.6 C) 99.2 F (37.3 C) 97.9 F (36.6 C)  TempSrc: Oral Oral Oral Oral  SpO2: 97% 100% 100% 93%  Weight:      Height:        Intake/Output Summary (Last 24 hours) at 12/16/15 1113 Last data filed at 12/16/15 0700  Gross per 24 hour  Intake             2650 ml  Output             1550 ml  Net             1100 ml  Filed Weights   12/13/15 2156  Weight: 61.7 kg (136 lb 0.4 oz)    Examination: General exam: Appears comfortable  HEENT: PERRLA, oral mucosa moist, no sclera icterus or thrush Respiratory system: Clear to auscultation. Respiratory effort normal. Cardiovascular system: S1 & S2 heard, RRR.  No murmurs  Gastrointestinal system: Abdomen soft, non-tender, nondistended. Normal bowel sound. No organomegaly Central nervous system: Alert and oriented. No focal neurological deficits. Extremities: No cyanosis, clubbing or edema Skin: No rashes or ulcers Psychiatry:  Mood & affect appropriate.     Data Reviewed: I have personally reviewed following labs and imaging studies  CBC:  Recent Labs Lab 12/13/15 1446 12/13/15 1949 12/14/15 0514  WBC 11.0* 9.6 7.5  NEUTROABS  9.9*  --  5.7  HGB 12.4* 12.4* 11.4*  HCT 36.0* 34.8* 32.1*  MCV 90.5 86.1 87.0  PLT 345.0 350 062   Basic Metabolic Panel:  Recent Labs Lab 12/13/15 1446 12/13/15 1949 12/14/15 0514 12/14/15 1334 12/15/15 0516  NA 120* 122* 128* 128* 129*  K 5.6* 5.3* 4.5 4.8 3.9  CL 88* 89* 96* 96* 95*  CO2 27 26 27 27 27   GLUCOSE 120* 106* 94 107* 92  BUN 16 15 14 15 14   CREATININE 0.87 0.96 0.74 0.78 0.82  CALCIUM 9.1 9.4 8.8* 8.6* 8.5*  MG 2.4 2.4  --   --   --   PHOS  --  3.9  --   --   --    GFR: Estimated Creatinine Clearance: 58.5 mL/min (by C-G formula based on SCr of 0.82 mg/dL). Liver Function Tests:  Recent Labs Lab 12/13/15 1446 12/13/15 1949 12/14/15 0514  AST 21 26 21   ALT 17 21 17   ALKPHOS 82 77 65  BILITOT 0.6 1.0 0.7  PROT 6.2 6.5 5.6*  ALBUMIN 3.8 4.0 3.0*   No results for input(s): LIPASE, AMYLASE in the last 168 hours. No results for input(s): AMMONIA in the last 168 hours. Coagulation Profile: No results for input(s): INR, PROTIME in the last 168 hours. Cardiac Enzymes: No results for input(s): CKTOTAL, CKMB, CKMBINDEX, TROPONINI in the last 168 hours. BNP (last 3 results) No results for input(s): PROBNP in the last 8760 hours. HbA1C: No results for input(s): HGBA1C in the last 72 hours. CBG: No results for input(s): GLUCAP in the last 168 hours. Lipid Profile: No results for input(s): CHOL, HDL, LDLCALC, TRIG, CHOLHDL, LDLDIRECT in the last 72 hours. Thyroid Function Tests:  Recent Labs  12/13/15 2049  TSH 3.281   Anemia Panel:  Recent Labs  12/13/15 1446  VITAMINB12 668   Urine analysis:    Component Value Date/Time   COLORURINE AMBER (A) 12/13/2015 2039   APPEARANCEUR CLOUDY (A) 12/13/2015 2039   LABSPEC 1.008 12/13/2015 2039   PHURINE 7.0 12/13/2015 2039   GLUCOSEU NEGATIVE 12/13/2015 2039   HGBUR LARGE (A) 12/13/2015 2039   BILIRUBINUR NEGATIVE 12/13/2015 2039   BILIRUBINUR n 12/13/2015 1510   KETONESUR NEGATIVE 12/13/2015  2039   PROTEINUR 30 (A) 12/13/2015 2039   UROBILINOGEN 1.0 12/13/2015 1510   UROBILINOGEN 0.2 07/11/2012 0200   NITRITE POSITIVE (A) 12/13/2015 2039   LEUKOCYTESUR LARGE (A) 12/13/2015 2039   Sepsis Labs: @LABRCNTIP (procalcitonin:4,lacticidven:4) ) Recent Results (from the past 240 hour(s))  Culture, Urine     Status: None   Collection Time: 12/14/15  1:47 PM  Result Value Ref Range Status   Specimen Description URINE, CLEAN CATCH  Final   Special Requests NONE  Final   Culture NO GROWTH  Performed at [facility]   Final   Report Status 12/15/2015 FINAL  Final         Radiology Studies: No results found.    Scheduled Meds: . alfuzosin  10 mg Oral Q1200  . enoxaparin (LOVENOX) injection  40 mg Subcutaneous Q24H  . meloxicam  15 mg Oral QPM  . naphazoline-pheniramine  1 drop Both Eyes BID  . [START ON 12/17/2015] Plecanatide  3 mg Oral QAC breakfast  . protein supplement shake  11 oz Oral BID BM  . sodium chloride flush  3 mL Intravenous Q12H  . sodium chloride  1 g Oral TID WC   Continuous Infusions: . sodium chloride 50 mL/hr at 12/15/15 1714     LOS: 3 days    Time spent in minutes: [place], MD [facility] Pager: [email or web address] Password [email or web address] 12/16/2015, 11:13 AM

## 2015-12-16 NOTE — Progress Notes (Signed)
  Assessment: 1.  Hyponatremia - His trimethoprim has been stopped and his serum sodium is returning to normal with sodium repletion.  2.  Pyuria - This was clearly secondary to his recent intravesical BCG treatments.  His urine culture has returned negative.  Further antibiotics for a UTI are no longer indicated.  3.  Irritative voiding symptoms and elevated PVR - he currently has a Foley catheter in.  He was experiencing fairly significant frequency prior to coming into the hospital but that seemed with improved slightly.  He would like to be free of the catheter and is currently taking oxybutynin which needs to be stopped.  In addition he is on Uroxatral and so I am going to add to that a phosphodiesterase inhibitor to maximize his medical management and then have him undergo a voiding trial tomorrow morning.   Plan: 1.   I have discontinued his oxybutynin. 2.  He will remain on Uroxatral. 3.  I have added Cialis 5 mg for daily use. 4.  His Foley catheter will be removed early morning tomorrow and PVRs will be checked.    Subjective: Patient reports no new urologic complaints.  Objective: Vital signs in last 24 hours: Temp:  [97.9 F (36.6 C)-99.2 F (37.3 C)] 98.4 F (36.9 C) (09/11 1316) Pulse Rate:  [71-78] 77 (09/11 1316) Resp:  [18] 18 (09/11 1316) BP: (95-120)/(45-67) 95/45 (09/11 1316) SpO2:  [93 %-100 %] 100 % (09/11 1316)A  Intake/Output from previous day: 09/10 0701 - 09/11 0700 In: 2650 [I.V.:600; IV Piggyback:50] Out: 1900 [Urine:1900] Intake/Output this shift: Total I/O In: -  Out: 600 [Urine:600]  Past Medical History:  Diagnosis Date  . Bladder cancer [facility]) urology-- dr Karsten Ro   dx 11/ 2016  Transitional cell carcinoma, High grade invasive , Stage T1  G3 ---  s/p  TURBT and Mitomycin C instillation  . BPH with urinary obstruction   . Dry skin dermatitis   . Elevated PSA   . History of colon polyps   . Hydronephrosis, left   . Lower urinary tract  symptoms (LUTS)   . OA (osteoarthritis)   . Prostate nodule   . Seasonal allergic rhinitis   . Wears glasses     Physical Exam:  Lungs - Normal respiratory effort, chest expands symmetrically.  Abdomen - Soft, non-tender & non-distended.  Lab Results:  Recent Labs  12/13/15 1446 12/13/15 1949 12/14/15 0514  WBC 11.0* 9.6 7.5  HGB 12.4* 12.4* 11.4*  HCT 36.0* 34.8* 32.1*   BMET  Recent Labs  12/14/15 1334 12/15/15 0516  NA 128* 129*  K 4.8 3.9  CL 96* 95*  CO2 27 27  GLUCOSE 107* 92  BUN 15 14  CREATININE 0.78 0.82  CALCIUM 8.6* 8.5*   No results for input(s): LABURIN in the last 72 hours. Results for orders placed or performed during the hospital encounter of 12/13/15  Culture, Urine     Status: None   Collection Time: 12/14/15  1:47 PM  Result Value Ref Range Status   Specimen Description URINE, CLEAN CATCH  Final   Special Requests NONE  Final   Culture NO GROWTH Performed at [facility]   Final   Report Status 12/15/2015 FINAL  Final    Studies/Results: No results found.    Dia Jefferys C 12/16/2015, 1:31 PM

## 2015-12-16 NOTE — Progress Notes (Signed)
I am taking over care from the previous nurse. I have received report from the nurse and agree with her assessment. Patient is stable and resting comfortably.

## 2015-12-17 DIAGNOSIS — G9340 Encephalopathy, unspecified: Secondary | ICD-10-CM

## 2015-12-17 LAB — GLUCOSE, CAPILLARY: Glucose-Capillary: 88 mg/dL (ref 65–99)

## 2015-12-17 LAB — BASIC METABOLIC PANEL
Anion gap: 6 (ref 5–15)
BUN: 17 mg/dL (ref 6–20)
CALCIUM: 8.7 mg/dL — AB (ref 8.9–10.3)
CO2: 26 mmol/L (ref 22–32)
CREATININE: 0.7 mg/dL (ref 0.61–1.24)
Chloride: 100 mmol/L — ABNORMAL LOW (ref 101–111)
GFR calc Af Amer: >60 mL/min (ref >60)
GFR calc non Af Amer: >60 mL/min (ref >60)
GLUCOSE: 118 mg/dL — AB (ref 65–99)
Potassium: 4.3 mmol/L (ref 3.5–5.1)
Sodium: 132 mmol/L — ABNORMAL LOW (ref 135–145)

## 2015-12-17 MED ORDER — PREMIER PROTEIN SHAKE
11.0000 [oz_av] | Freq: Two times a day (BID) | ORAL | Status: DC
  Administered 2015-12-17: 11 [oz_av] via ORAL
  Filled 2015-12-17 (×2): qty 325.31

## 2015-12-17 MED ORDER — TADALAFIL 5 MG PO TABS: 5.0000 mg | ORAL_TABLET | Freq: Every day | ORAL | 0 refills | Status: DC

## 2015-12-17 NOTE — Progress Notes (Signed)
Bladder scanned patient which yielded ~400 cc.  Patient used the restroom and had 300cc urine output.  RN did post void residual which showed ~130.  Patient has d/c to home orders.  Will d/c IV and d/c patient home.

## 2015-12-17 NOTE — Progress Notes (Signed)
  Assessment: 1.  Hyponatremia - He is on trimethoprim and has responded to medical management.  2.  Pyuria - Secondary to recent BCG treatment.  3.  Irritative voiding symptoms and elevated PVR - I stop his oxybutynin and maintain him on Uroxatral with the addition of Cialis 5 mg to reduce outlet resistance maximally. His catheter was removed this morning for a trial of voiding.     Plan:  1. Remain off oxybutynin for now. 2. Maximum medical management for outlet obstruction. 3. He will follow-up with me as an outpatient for further management of his voiding symptoms.     Subjective: Patient reports  No new urologic complaints this morning.  Objective: Vital signs in last 24 hours: Temp:  [98.2 F (36.8 C)-98.4 F (36.9 C)] 98.4 F (36.9 C) (09/12 0445) Pulse Rate:  [67-78] 67 (09/12 0445) Resp:  [18] 18 (09/12 0445) BP: (95-102)/(45-60) 96/59 (09/12 0445) SpO2:  [96 %-100 %] 96 % (09/12 0445)A  Intake/Output from previous day: 09/11 0701 - 09/12 0700 In: 400  Out: 2450 [Urine:2450] Intake/Output this shift: No intake/output data recorded.  Past Medical History:  Diagnosis Date  . Bladder cancer [facility]) urology-- dr Karsten Ro   dx 11/ 2016  Transitional cell carcinoma, High grade invasive , Stage T1  G3 ---  s/p  TURBT and Mitomycin C instillation  . BPH with urinary obstruction   . Dry skin dermatitis   . Elevated PSA   . History of colon polyps   . Hydronephrosis, left   . Lower urinary tract symptoms (LUTS)   . OA (osteoarthritis)   . Prostate nodule   . Seasonal allergic rhinitis   . Wears glasses     Physical Exam:  Lungs - Normal respiratory effort, chest expands symmetrically.  Abdomen - Soft, non-tender & non-distended.  Lab Results: No results for input(s): WBC, HGB, HCT in the last 72 hours. BMET  Recent Labs  12/14/15 1334 12/15/15 0516  NA 128* 129*  K 4.8 3.9  CL 96* 95*  CO2 27 27  GLUCOSE 107* 92  BUN 15 14  CREATININE 0.78 0.82   CALCIUM 8.6* 8.5*   No results for input(s): LABURIN in the last 72 hours. Results for orders placed or performed during the hospital encounter of 12/13/15  Culture, Urine     Status: None   Collection Time: 12/14/15  1:47 PM  Result Value Ref Range Status   Specimen Description URINE, CLEAN CATCH  Final   Special Requests NONE  Final   Culture NO GROWTH Performed at [facility]   Final   Report Status 12/15/2015 FINAL  Final    Studies/Results: No results found.    Claybon Jabs 12/17/2015, 7:38 AM

## 2015-12-17 NOTE — Discharge Summary (Signed)
Physician Discharge Summary  Benjamin Cardenas EUM:[id number] DOB: [birth year] DOA: 12/13/2015  PCP: Nyoka Cowden, MD  Admit date: 12/13/2015 Discharge date: 12/17/2015  Admitted From: home  Disposition:  home   Recommendations for Outpatient Follow-up:  1. F/u sodium level in 1 wk  Home Health:  none  Equipment/Devices:  none    Discharge Condition:  stable   CODE STATUS:  Full code   Diet recommendation:  Regular diet Consultations:  Uology    Discharge Diagnoses:  Principal Problem:   Hyponatremia Active Problems: Acute encephalopathy - Bladder cancer   BPH (benign prostatic hyperplasia) with urinary retention   Memory impairment   Anemia     Subjective: Voiding well after removal of foley today. Less cough and congestion today. No new complaints.   Brief Summary: Benjamin Cardenas a 82 y.o.malewith medical history significant of bladder cancer, BPH, prostate nodule, colon polyps, osteoarthritis, seasonal allergies who was sent by his PCP office to the emergency department due to abnormal sodium level. The patient and his wife state that he has been experiencing lower extremity cramping, dysuria, urinary frequency, confusion, weakness and unsteady gait for the several days. He is stating that he has been constipated since last week.   He went to see his PCP on the day of admission who ordered some labs and found him to be with a sodium of 120 mmol/liter, potassium 5.6 mmol/liter and urinalysis showing nitrites and positive leukocyte esterase. The patient states that he has been drinking more water than usual, about 2-1/2 quarts of water every day lately.  Hospital Course:   Principal Problem:   Hyponatremia - have discontinued Trimethoprim which can cause hyponatremia - S osm-262,  U osm 215, U sodium 17- treated with NS and sodium chloride tabs - TSH normal - Sodium has steadily improved from 120 to 132 with sodium tabs and NS - recommended recheck in 1  wk    Active Problems: UTI? in setting of bladder cancer - stop trimethoprim due to hyponatremia - has numerous allergies to antibiotics- Started Rocephin instead of Azactam- culture remains negative - have asked for Urology consult - Urology has low suspicion for UTI and suspected the positive UA is secondary to BCG treatment - have d/c'd Rocephin  Cough/congestion - acute bronchitis? Likely viral - lungs sound clear- added mucinex- seems improved today    Urinary retention; BPH (benign prostatic hyperplasia) - Foley recommended by Urology and placed a few days ago - continued Alfuzosin- Cialis added by Dr Karsten Ro - voiding trial given today- he is voiding well   Constipation - chronic issue managed by GI - no BM in 1 wk-  Resolved with Mag Citrate and Miralax   Discharge Instructions  Discharge Instructions    Discharge instructions    Complete by:  As directed   Regular diet   Increase activity slowly    Complete by:  As directed       Medication List    STOP taking these medications   ALEVE 220 MG tablet Generic drug:  naproxen sodium   trimethoprim 100 MG tablet Commonly known as:  TRIMPEX     TAKE these medications   alfuzosin 10 MG 24 hr tablet Commonly known as:  UROXATRAL Take 10 mg by mouth daily at 12 noon.   aspirin EC 81 MG tablet Take 81 mg by mouth daily.   BENEFIBER DRINK MIX PO Take 5 mLs by mouth daily.   cetirizine 10 MG tablet Commonly known as:  ZYRTEC Take 10  mg by mouth daily as needed for allergies.   HYDROcodone-acetaminophen 10-325 MG tablet Commonly known as:  NORCO Take 1-2 tablets by mouth every 4 (four) hours as needed for moderate pain. Maximum dose per 24 hours - 8 pills   meloxicam 15 MG tablet Commonly known as:  MOBIC Take 15 mg by mouth every evening.   naphazoline-pheniramine 0.025-0.3 % ophthalmic solution Commonly known as:  NAPHCON-A Place 1 drop into both eyes 2 (two) times daily.   phenazopyridine 200  MG tablet Commonly known as:  PYRIDIUM Take 1 tablet (200 mg total) by mouth 3 (three) times daily as needed for pain.   polyethylene glycol packet Commonly known as:  MIRALAX / GLYCOLAX Take 17-34 g by mouth daily.   tadalafil 5 MG tablet Commonly known as:  CIALIS Take 1 tablet (5 mg total) by mouth daily.   TRULANCE 3 MG Tabs Generic drug:  Plecanatide Take 3 mg by mouth every morning.       Allergies  Allergen Reactions  . Sulfa Antibiotics Swelling  . Amoxicillin Rash    Has patient had a PCN reaction causing immediate rash, facial/tongue/throat swelling, SOB or lightheadedness with hypotension:No Has patient had a PCN reaction causing severe rash involving mucus membranes or skin necrosis:unsure Has patient had a PCN reaction that required hospitalization:No Has patient had a PCN reaction occurring within the last 10 years:unsure If all of the above answers are "NO", then may proceed with Cephalosporin use.   . Ciprofloxacin Swelling and Rash  . Doxycycline Hyclate Rash  . Levofloxacin Rash     Procedures/Studies:   No results found.     Discharge Exam: Vitals:   12/16/15 2057 12/17/15 0445  BP: 102/60 (!) 96/59  Pulse: 78 67  Resp: 18 18  Temp: 98.2 F (36.8 C) 98.4 F (36.9 C)   Vitals:   12/16/15 0632 12/16/15 1316 12/16/15 2057 12/17/15 0445  BP: 120/64 (!) 95/45 102/60 (!) 96/59  Pulse: 78 77 78 67  Resp: 18 18 18 18   Temp: 97.9 F (36.6 C) 98.4 F (36.9 C) 98.2 F (36.8 C) 98.4 F (36.9 C)  TempSrc: Oral Oral Oral Oral  SpO2: 93% 100% 96% 96%  Weight:      Height:        General: Pt is alert, awake, not in acute distress Cardiovascular: RRR, S1/S2 +, no rubs, no gallops Respiratory: CTA bilaterally, no wheezing, no rhonchi Abdominal: Soft, NT, ND, bowel sounds + Extremities: no edema, no cyanosis    The results of significant diagnostics from this hospitalization (including imaging, microbiology, ancillary and laboratory) are  listed below for reference.     Microbiology: Recent Results (from the past 240 hour(s))  Culture, Urine     Status: None   Collection Time: 12/14/15  1:47 PM  Result Value Ref Range Status   Specimen Description URINE, CLEAN CATCH  Final   Special Requests NONE  Final   Culture NO GROWTH Performed at [facility]   Final   Report Status 12/15/2015 FINAL  Final     Labs: BNP (last 3 results) No results for input(s): BNP in the last 8760 hours. Basic Metabolic Panel:  Recent Labs Lab 12/13/15 1446 12/13/15 1949 12/14/15 0514 12/14/15 1334 12/15/15 0516 12/17/15 0828  NA 120* 122* 128* 128* 129* 132*  K 5.6* 5.3* 4.5 4.8 3.9 4.3  CL 88* 89* 96* 96* 95* 100*  CO2 27 26 27 27 27 26   GLUCOSE 120* 106* 94 107* 92  118*  BUN 16 15 14 15 14 17   CREATININE 0.87 0.96 0.74 0.78 0.82 0.70  CALCIUM 9.1 9.4 8.8* 8.6* 8.5* 8.7*  MG 2.4 2.4  --   --   --   --   PHOS  --  3.9  --   --   --   --    Liver Function Tests:  Recent Labs Lab 12/13/15 1446 12/13/15 1949 12/14/15 0514  AST 21 26 21   ALT 17 21 17   ALKPHOS 82 77 65  BILITOT 0.6 1.0 0.7  PROT 6.2 6.5 5.6*  ALBUMIN 3.8 4.0 3.0*   No results for input(s): LIPASE, AMYLASE in the last 168 hours. No results for input(s): AMMONIA in the last 168 hours. CBC:  Recent Labs Lab 12/13/15 1446 12/13/15 1949 12/14/15 0514  WBC 11.0* 9.6 7.5  NEUTROABS 9.9*  --  5.7  HGB 12.4* 12.4* 11.4*  HCT 36.0* 34.8* 32.1*  MCV 90.5 86.1 87.0  PLT 345.0 350 336   Cardiac Enzymes: No results for input(s): CKTOTAL, CKMB, CKMBINDEX, TROPONINI in the last 168 hours. BNP: Invalid input(s): POCBNP CBG:  Recent Labs Lab 12/17/15 0741  GLUCAP 88   D-Dimer No results for input(s): DDIMER in the last 72 hours. Hgb A1c No results for input(s): HGBA1C in the last 72 hours. Lipid Profile No results for input(s): CHOL, HDL, LDLCALC, TRIG, CHOLHDL, LDLDIRECT in the last 72 hours. Thyroid function studies No results for  input(s): TSH, T4TOTAL, T3FREE, THYROIDAB in the last 72 hours.  Invalid input(s): FREET3 Anemia work up No results for input(s): VITAMINB12, FOLATE, FERRITIN, TIBC, IRON, RETICCTPCT in the last 72 hours. Urinalysis    Component Value Date/Time   COLORURINE AMBER (A) 12/13/2015 2039   APPEARANCEUR CLOUDY (A) 12/13/2015 2039   LABSPEC 1.008 12/13/2015 2039   PHURINE 7.0 12/13/2015 2039   GLUCOSEU NEGATIVE 12/13/2015 2039   HGBUR LARGE (A) 12/13/2015 2039   BILIRUBINUR NEGATIVE 12/13/2015 2039   BILIRUBINUR n 12/13/2015 1510   KETONESUR NEGATIVE 12/13/2015 2039   PROTEINUR 30 (A) 12/13/2015 2039   UROBILINOGEN 1.0 12/13/2015 1510   UROBILINOGEN 0.2 07/11/2012 0200   NITRITE POSITIVE (A) 12/13/2015 2039   LEUKOCYTESUR LARGE (A) 12/13/2015 2039   Sepsis Labs Invalid input(s): PROCALCITONIN,  WBC,  LACTICIDVEN Microbiology Recent Results (from the past 240 hour(s))  Culture, Urine     Status: None   Collection Time: 12/14/15  1:47 PM  Result Value Ref Range Status   Specimen Description URINE, CLEAN CATCH  Final   Special Requests NONE  Final   Culture NO GROWTH Performed at [facility]   Final   Report Status 12/15/2015 FINAL  Final     Time coordinating discharge: Over 30 minutes  SIGNED:   Debbe Odea, MD  [facility] 12/17/2015, 12:30 PM Pager   If 7PM-7AM, please contact night-coverage [email or web address] Password [email or web address]

## 2015-12-17 NOTE — Care Management Note (Signed)
Case Management Note  Patient Details  Name: Benjamin Cardenas MRN: [id number] Date of Birth: 24-Jan-1930  Subjective/Objective:  PT-no f/u needed. No further CM needs.                  Action/Plan:d/c home no needs or orders.   Expected Discharge Date:                  Expected Discharge Plan:  Home/Self Care  In-House Referral:     Discharge planning Services  CM Consult  Post Acute Care Choice:    Choice offered to:     DME Arranged:    DME Agency:     HH Arranged:    [place] Agency:     Status of Service:  Completed, signed off  If discussed at [employer] of Stay Meetings, dates discussed:    Additional Comments:  Dessa Phi, RN 12/17/2015, 1:05 PM

## 2015-12-17 NOTE — Care Management Note (Signed)
Case Management Note  Patient Details  Name: Benjamin Cardenas MRN: [id number] Date of Birth: June 12, 1929  Subjective/Objective: Awaiting PT/OT recc for safe d/c plan.Nsg updated.                   Action/Plan:d/c plan home.   Expected Discharge Date:                  Expected Discharge Plan:  Home/Self Care  In-House Referral:     Discharge planning Services  CM Consult  Post Acute Care Choice:    Choice offered to:     DME Arranged:    DME Agency:     HH Arranged:    HH Agency:     Status of Service:  In process, will continue to follow  If discussed at Long Length of Stay Meetings, dates discussed:    Additional Comments:  Dessa Phi, RN 12/17/2015, 12:59 PM

## 2015-12-17 NOTE — Evaluation (Signed)
Physical Therapy Evaluation Patient Details Name: Benjamin Cardenas MRN: [id number] DOB: 12-20-1929 Today's Date: 12/17/2015   History of Present Illness  Benjamin Cardenas is a 82 y.o. male with medical history significant of bladder cancer, BPH, prostate nodule, colon polyps, osteoarthritis, seasonal allergies who was sent by his PCP office to the emergency department due to abnormal sodium level  Clinical Impression  The patient has been ambulating with wife per patient and  RN. He did have 1 balance loss when he slid off of his shoe. Recommended that he not wear those. Patient plans to DC home. Declines HHPT at present. No further PT needs.    Follow Up Recommendations No PT follow up    Equipment Recommendations  None recommended by PT    Recommendations for Other Services       Precautions / Restrictions Precautions Precautions: Fall      Mobility  Bed Mobility Overal bed mobility: Independent                Transfers Overall transfer level: Independent                  Ambulation/Gait Ambulation/Gait assistance: Supervision;Min guard Ambulation Distance (Feet): 400 Feet Assistive device: 1 person hand held assist;None Gait Pattern/deviations: Step-through pattern     General Gait Details: ocassional minguard when  lost balance when he slipped off his shoe. Encouraged him to not wear those shoes at home  Stairs            Wheelchair Mobility    Modified Rankin (Stroke Patients Only)       Balance Overall balance assessment: Needs assistance   Sitting balance-Leahy Scale: Normal     Standing balance support: No upper extremity supported Standing balance-Leahy Scale: Fair                               Pertinent Vitals/Pain Pain Assessment: No/denies pain    Home Living Family/patient expects to be discharged to:: Private residence Living Arrangements: Spouse/significant other Available Help at Discharge: Family Type of  Home: House   Entrance Stairs-Rails: Right   Home Layout: Two level;Able to live on main level with bedroom/bathroom Home Equipment: None      Prior Function Level of Independence: Needs assistance      ADL's / Homemaking Assistance Needed: wife assists with meeals        Hand Dominance        Extremity/Trunk Assessment   Upper Extremity Assessment: Overall WFL for tasks assessed           Lower Extremity Assessment: Generalized weakness      Cervical / Trunk Assessment: Kyphotic  Communication      Cognition Arousal/Alertness: Awake/alert Behavior During Therapy: WFL for tasks assessed/performed Overall Cognitive Status: No family/caregiver present to determine baseline cognitive functioning                      General Comments      Exercises        Assessment/Plan    PT Assessment Patent does not need any further PT services  PT Diagnosis Generalized weakness   PT Problem List    PT Treatment Interventions     PT Goals (Current goals can be found in the Care Plan section) Acute Rehab PT Goals Patient Stated Goal: to go home PT Goal Formulation: All assessment and education complete, DC therapy    Frequency  Barriers to discharge        Co-evaluation               End of Session   Activity Tolerance: Patient tolerated treatment well Patient left: in bed;with call bell/phone within reach;with bed alarm set Nurse Communication: Mobility status         Time: 1208-1225 PT Time Calculation (min) (ACUTE ONLY): 17 min   Charges:   PT Evaluation $PT Eval Low Complexity: 1 Procedure     PT G Codes:        Benjamin Cardenas 12/17/2015, 1:10 PM

## 2015-12-19 ENCOUNTER — Ambulatory Visit (INDEPENDENT_AMBULATORY_CARE_PROVIDER_SITE_OTHER): Payer: Medicare Other | Admitting: Adult Health

## 2015-12-19 ENCOUNTER — Encounter: Payer: Self-pay | Admitting: Adult Health

## 2015-12-19 VITALS — BP 98/50 | Temp 98.6°F | Ht 66.0 in | Wt 149.6 lb

## 2015-12-19 DIAGNOSIS — R413 Other amnesia: Secondary | ICD-10-CM | POA: Diagnosis not present

## 2015-12-19 DIAGNOSIS — I959 Hypotension, unspecified: Secondary | ICD-10-CM

## 2015-12-19 DIAGNOSIS — E871 Hypo-osmolality and hyponatremia: Secondary | ICD-10-CM | POA: Diagnosis not present

## 2015-12-19 LAB — BASIC METABOLIC PANEL
BUN: 22 mg/dL (ref 6–23)
CALCIUM: 8.8 mg/dL (ref 8.4–10.5)
CO2: 30 meq/L (ref 19–32)
Chloride: 97 mEq/L (ref 96–112)
Creatinine, Ser: 0.69 mg/dL (ref 0.40–1.50)
GFR: 116.02 mL/min (ref >60.00)
Glucose, Bld: 95 mg/dL (ref 70–99)
Potassium: 5.4 mEq/L — ABNORMAL HIGH (ref 3.5–5.1)
SODIUM: 130 meq/L — AB (ref 135–145)

## 2015-12-19 LAB — CORTISOL: CORTISOL PLASMA: 8.3 ug/dL

## 2015-12-19 MED ORDER — SODIUM CHLORIDE 1 G PO TABS: 1.0000 g | ORAL_TABLET | Freq: Two times a day (BID) | ORAL | 0 refills | Status: DC

## 2015-12-19 NOTE — Patient Instructions (Signed)
It was great seeing you today and I am glad you are feeling better.   I will follow up with you regarding your labs.   Please follow up with Dr. Raliegh Ip as needed

## 2015-12-19 NOTE — Progress Notes (Signed)
Subjective:    Patient ID: Benjamin Cardenas, male    DOB: Apr 04, 1930, 82 y.o.   MRN: [id number]  HPI  82 year old male who presents to the office today for hospital follow up. He was admitted on 12/13/2015 and discharged on 12/17/2015. He was admitted for hyponatremia with Acute encephalopathy, Bladder cancer, BPH (benign prostatic hyperplasia) with urinary retention,  Memory impairment, and anemia.    He was seen by me prior to being sent to the ER for confusion, lower extremity cramping, dysuria and weakness. Labs were done which showed sodium of 120 mmol/liter, potassium 5.6 mmol/liter and urinalysis showing nitrites and positive leukocyte esterase  While in the hospital he had Trimethoprim discontinued due to possibly causing hyponatremia. His sodium steadly improved from 120 to 132 with sodium tabs and NS  His questionable UTI was thought to be the secondary to BCG treatment and antibiotics were stopped.  He was discharged and asked to follow up with PCP  Today in the office he is wife his wife. They both report that Benjamin Cardenas has made progress. His confusion has nearly resolved. He had one episode at home where he forgot where the restroom was, this was the night he came home from the hospital. He no longer feels as unsteady on his feet and is not having any issues with urinary retention.   His blood pressure has been in the 90/60's since he has returned from the hospital. He does not endorse any symptoms of hypotension.   Review of Systems  Constitutional: Negative.   HENT: Negative.   Respiratory: Negative.   Cardiovascular: Negative.   Gastrointestinal: Positive for diarrhea. Negative for abdominal distention, abdominal pain, anal bleeding, blood in stool, constipation, nausea and vomiting.  Genitourinary: Negative.   Neurological: Negative.   All other systems reviewed and are negative.  Past Medical History:  Diagnosis Date  . Bladder cancer [facility]) urology-- dr Karsten Ro   dx  11/ 2016  Transitional cell carcinoma, High grade invasive , Stage T1  G3 ---  s/p  TURBT and Mitomycin C instillation  . BPH with urinary obstruction   . Dry skin dermatitis   . Elevated PSA   . History of colon polyps   . Hydronephrosis, left   . Lower urinary tract symptoms (LUTS)   . OA (osteoarthritis)   . Prostate nodule   . Seasonal allergic rhinitis   . Wears glasses     Social History   Social History  . Marital status: Married    Spouse name: N/A  . Number of children: N/A  . Years of education: N/A   Occupational History  . Not on file.   Social History Main Topics  . Smoking status: Former Smoker    Packs/day: 1.50    Years: 34.00    Types: Cigarettes    Quit date: 02/17/1979  . Smokeless tobacco: Never Used  . Alcohol use Yes     Comment: occasional  . Drug use: No  . Sexual activity: Not on file   Other Topics Concern  . Not on file   Social History Narrative  . No narrative on file    Past Surgical History:  Procedure Laterality Date  . CYSTOSCOPY WITH BIOPSY N/A 04/22/2015   Procedure: CYSTOSCOPY WITH BLADDER BIOPSY;  Surgeon: Kathie Rhodes, MD;  Location: [facility];  Service: Urology;  Laterality: N/A;  . CYSTOSCOPY WITH RETROGRADE PYELOGRAM, URETEROSCOPY AND STENT PLACEMENT Left 02/25/2015   Procedure: LEFT RETROGRADE PYELOGRAM, URETEROSCOPY ;  Surgeon: Kathie Rhodes, MD;  Location: [facility];  Service: Urology;  Laterality: Left;  . HERNIA REPAIR  age 89 1937   rupture right inguinal hernia  . INGUINAL HERNIA REPAIR  03/02/2011   Procedure: HERNIA REPAIR INGUINAL ADULT;  Surgeon: Earnstine Regal, MD;  Location: [facility];  Service: General;  Laterality: Left;  with mesh   . TRANSURETHRAL RESECTION OF BLADDER TUMOR WITH GYRUS (TURBT-GYRUS) N/A 02/25/2015   Procedure: TRANSURETHRAL RESECTION OF BLADDER TUMOR WITH GYRUS (TURBT-GYRUS);  Surgeon: Kathie Rhodes, MD;  Location: [facility];  Service: Urology;   Laterality: N/A;    Family History  Problem Relation Age of Onset  . Heart disease Mother   . Heart disease Father   . Cancer Sister     breast  . Cancer Brother     colon    Allergies  Allergen Reactions  . Sulfa Antibiotics Swelling  . Amoxicillin Rash    Has patient had a PCN reaction causing immediate rash, facial/tongue/throat swelling, SOB or lightheadedness with hypotension:No Has patient had a PCN reaction causing severe rash involving mucus membranes or skin necrosis:unsure Has patient had a PCN reaction that required hospitalization:No Has patient had a PCN reaction occurring within the last 10 years:unsure If all of the above answers are "NO", then may proceed with Cephalosporin use.   . Ciprofloxacin Swelling and Rash  . Doxycycline Hyclate Rash  . Levofloxacin Rash    Current Outpatient Prescriptions on File Prior to Visit  Medication Sig Dispense Refill  . alfuzosin (UROXATRAL) 10 MG 24 hr tablet Take 10 mg by mouth daily at 12 noon.     [name] aspirin EC 81 MG tablet Take 81 mg by mouth daily.    . cetirizine (ZYRTEC) 10 MG tablet Take 10 mg by mouth daily as needed for allergies.    . meloxicam (MOBIC) 15 MG tablet Take 15 mg by mouth every evening.     . naphazoline-pheniramine (NAPHCON-A) 0.025-0.3 % ophthalmic solution Place 1 drop into both eyes 2 (two) times daily.    . phenazopyridine (PYRIDIUM) 200 MG tablet Take 1 tablet (200 mg total) by mouth 3 (three) times daily as needed for pain. 20 tablet 0  . polyethylene glycol (MIRALAX / GLYCOLAX) packet Take 17-34 g by mouth daily.    . tadalafil (CIALIS) 5 MG tablet Take 1 tablet (5 mg total) by mouth daily. 30 tablet 0  . TRULANCE 3 MG TABS Take 3 mg by mouth every morning.     . Wheat Dextrin (BENEFIBER DRINK MIX PO) Take 5 mLs by mouth daily.     [name] HYDROcodone-acetaminophen (NORCO) 10-325 MG tablet Take 1-2 tablets by mouth every 4 (four) hours as needed for moderate pain. Maximum dose per 24 hours - 8 pills  (Patient not taking: Reported on 12/19/2015) 18 tablet 0   No current facility-administered medications on file prior to visit.     BP (!) 98/50   Temp 98.6 F (37 C) (Oral)   Ht 5\' 6"  (1.676 m)   Wt 149 lb 9.6 oz (67.9 kg)   BMI 24.15 kg/m       Objective:   Physical Exam  Constitutional: He is oriented to person, place, and time. He appears well-developed and well-nourished. No distress.  HENT:  Head: Normocephalic and atraumatic.  Right Ear: External ear normal.  Left Ear: External ear normal.  Nose: Nose normal.  Mouth/Throat: Oropharynx is clear and moist. No oropharyngeal exudate.  Eyes: Conjunctivae and  EOM are normal. Pupils are equal, round, and reactive to light. Right eye exhibits no discharge. Left eye exhibits no discharge. No scleral icterus.  Cardiovascular: Normal rate, regular rhythm, normal heart sounds and intact distal pulses.  Exam reveals no gallop and no friction rub.   No murmur heard. Pulmonary/Chest: Effort normal and breath sounds normal. No respiratory distress. He has no wheezes. He has no rales. He exhibits no tenderness.  Musculoskeletal: Normal range of motion. He exhibits no edema, tenderness or deformity.  Slow steady gait  Neurological: He is alert and oriented to person, place, and time.  Skin: Skin is warm and dry. No rash noted. He is not diaphoretic. No erythema. No pallor.  Psychiatric: He has a normal mood and affect. His behavior is normal. Judgment and thought content normal.  Nursing note and vitals reviewed.     Assessment & Plan:  1. Hyponatremia - Basic metabolic panel - Sodium level of 130, K 5.4  - Cortisol - sodium chloride 1 g tablet; Take 1 tablet (1 g total) by mouth 2 (two) times daily with a meal.  Dispense: 30 tablet; Refill: 0 - Decrease high potassium foods - Follow up with PCP in one week or sooner if needed 2. Memory impairment - Has resolved  - Basic metabolic panel - Cortisol  3. Hypotension, unspecified  hypotension type - Continue to monitor at home.  - Stay hydrated - Go tot he ER with any symptoms of hypotension  - Basic metabolic panel - Cortisol - sodium chloride 1 g tablet; Take 1 tablet (1 g total) by mouth 2 (two) times daily with a meal.  Dispense: 30 tablet; Refill: 0  Dorothyann Peng, NP

## 2015-12-23 ENCOUNTER — Ambulatory Visit (INDEPENDENT_AMBULATORY_CARE_PROVIDER_SITE_OTHER): Payer: Medicare Other | Admitting: Internal Medicine

## 2015-12-23 ENCOUNTER — Encounter: Payer: Self-pay | Admitting: Internal Medicine

## 2015-12-23 VITALS — BP 96/60 | HR 81 | Temp 98.2°F | Resp 18 | Ht 66.0 in | Wt 148.4 lb

## 2015-12-23 DIAGNOSIS — R400 Somnolence: Secondary | ICD-10-CM | POA: Diagnosis not present

## 2015-12-23 DIAGNOSIS — E871 Hypo-osmolality and hyponatremia: Secondary | ICD-10-CM | POA: Diagnosis not present

## 2015-12-23 DIAGNOSIS — N400 Enlarged prostate without lower urinary tract symptoms: Secondary | ICD-10-CM | POA: Diagnosis not present

## 2015-12-23 LAB — BASIC METABOLIC PANEL
BUN: 19 mg/dL (ref 6–23)
CALCIUM: 8.4 mg/dL (ref 8.4–10.5)
CO2: 31 mEq/L (ref 19–32)
CREATININE: 0.82 mg/dL (ref 0.40–1.50)
Chloride: 103 mEq/L (ref 96–112)
GFR: 95.07 mL/min (ref >60.00)
Glucose, Bld: 92 mg/dL (ref 70–99)
Potassium: 5 mEq/L (ref 3.5–5.1)
Sodium: 136 mEq/L (ref 135–145)

## 2015-12-23 NOTE — Patient Instructions (Addendum)
Moderate your fluid intake.  Drink only when thirsty Return as scheduled for your annual exam next week.  Hold Cialis

## 2015-12-23 NOTE — Progress Notes (Signed)
Subjective:    Patient ID: Benjamin Cardenas, male    DOB: 31-Jul-1929, 82 y.o.   MRN: [id number]  HPI  82 year old patient who is seen today in follow-up.  He was minimal hospital recently with significant hyponatremia associated with mental status changes.  He was seen in follow-up last week.  He continues to improve.  Follow-up serum sodium last week was still 1:30.  He seems to be improving a bit clinically with less confusion.  At times he remains a bit forgetful. Serum cortisol was normal last week He has been on a salt rich diet but not much in way of fluid restriction.  He has had some lower extremity edema that has been present even before the hospital admission.  He has been followed by urology for a bladder tumor.  He has been receiving intravesical BCG treatments, but not for the past month.  Hospital course was complicated by urinary retention requiring temporary Foley catheterization.  He seems to be voiding well at the present time.  He is scheduled for urology follow-up next month Medical regimen does include meloxicam  Hospital records reviewed  Past Medical History:  Diagnosis Date  . Bladder cancer [facility]) urology-- dr Karsten Ro   dx 11/ 2016  Transitional cell carcinoma, High grade invasive , Stage T1  G3 ---  s/p  TURBT and Mitomycin C instillation  . BPH with urinary obstruction   . Dry skin dermatitis   . Elevated PSA   . History of colon polyps   . Hydronephrosis, left   . Lower urinary tract symptoms (LUTS)   . OA (osteoarthritis)   . Prostate nodule   . Seasonal allergic rhinitis   . Wears glasses      Social History   Social History  . Marital status: Married    Spouse name: N/A  . Number of children: N/A  . Years of education: N/A   Occupational History  . Not on file.   Social History Main Topics  . Smoking status: Former Smoker    Packs/day: 1.50    Years: 34.00    Types: Cigarettes    Quit date: 02/17/1979  . Smokeless tobacco: Never Used  .  Alcohol use Yes     Comment: occasional  . Drug use: No  . Sexual activity: Not on file   Other Topics Concern  . Not on file   Social History Narrative  . No narrative on file    Past Surgical History:  Procedure Laterality Date  . CYSTOSCOPY WITH BIOPSY N/A 04/22/2015   Procedure: CYSTOSCOPY WITH BLADDER BIOPSY;  Surgeon: Kathie Rhodes, MD;  Location: [facility];  Service: Urology;  Laterality: N/A;  . CYSTOSCOPY WITH RETROGRADE PYELOGRAM, URETEROSCOPY AND STENT PLACEMENT Left 02/25/2015   Procedure: LEFT RETROGRADE PYELOGRAM, URETEROSCOPY ;  Surgeon: Kathie Rhodes, MD;  Location: [facility];  Service: Urology;  Laterality: Left;  . HERNIA REPAIR  age 56 1937   rupture right inguinal hernia  . INGUINAL HERNIA REPAIR  03/02/2011   Procedure: HERNIA REPAIR INGUINAL ADULT;  Surgeon: Earnstine Regal, MD;  Location: [facility];  Service: General;  Laterality: Left;  with mesh   . TRANSURETHRAL RESECTION OF BLADDER TUMOR WITH GYRUS (TURBT-GYRUS) N/A 02/25/2015   Procedure: TRANSURETHRAL RESECTION OF BLADDER TUMOR WITH GYRUS (TURBT-GYRUS);  Surgeon: Kathie Rhodes, MD;  Location: [facility];  Service: Urology;  Laterality: N/A;    Family History  Problem Relation Age of Onset  . Heart disease  Mother   . Heart disease Father   . Cancer Sister     breast  . Cancer Brother     colon    Allergies  Allergen Reactions  . Sulfa Antibiotics Swelling  . Amoxicillin Rash    Has patient had a PCN reaction causing immediate rash, facial/tongue/throat swelling, SOB or lightheadedness with hypotension:No Has patient had a PCN reaction causing severe rash involving mucus membranes or skin necrosis:unsure Has patient had a PCN reaction that required hospitalization:No Has patient had a PCN reaction occurring within the last 10 years:unsure If all of the above answers are "NO", then may proceed with Cephalosporin use.   . Ciprofloxacin Swelling and Rash  .  Doxycycline Hyclate Rash  . Levofloxacin Rash    Current Outpatient Prescriptions on File Prior to Visit  Medication Sig Dispense Refill  . alfuzosin (UROXATRAL) 10 MG 24 hr tablet Take 10 mg by mouth daily at 12 noon.     [name] aspirin EC 81 MG tablet Take 81 mg by mouth daily.    . cetirizine (ZYRTEC) 10 MG tablet Take 10 mg by mouth daily as needed for allergies.    [name] HYDROcodone-acetaminophen (NORCO) 10-325 MG tablet Take 1-2 tablets by mouth every 4 (four) hours as needed for moderate pain. Maximum dose per 24 hours - 8 pills 18 tablet 0  . meloxicam (MOBIC) 15 MG tablet Take 15 mg by mouth every evening.     . naphazoline-pheniramine (NAPHCON-A) 0.025-0.3 % ophthalmic solution Place 1 drop into both eyes 2 (two) times daily.    . phenazopyridine (PYRIDIUM) 200 MG tablet Take 1 tablet (200 mg total) by mouth 3 (three) times daily as needed for pain. 20 tablet 0  . polyethylene glycol (MIRALAX / GLYCOLAX) packet Take 17-34 g by mouth daily.    . sodium chloride 1 g tablet Take 1 tablet (1 g total) by mouth 2 (two) times daily with a meal. 30 tablet 0  . tadalafil (CIALIS) 5 MG tablet Take 1 tablet (5 mg total) by mouth daily. 30 tablet 0  . TRULANCE 3 MG TABS Take 3 mg by mouth every morning.     . Wheat Dextrin (BENEFIBER DRINK MIX PO) Take 5 mLs by mouth daily.      No current facility-administered medications on file prior to visit.     BP 96/60 (BP Location: Left Arm, Patient Position: Sitting, Cuff Size: Normal)   Pulse 81   Temp 98.2 F (36.8 C) (Oral)   Resp 18   Ht 5\' 6"  (1.676 m)   Wt 148 lb 6.1 oz (67.3 kg)   SpO2 95%   BMI 23.95 kg/m     Review of Systems  Constitutional: Positive for activity change. Negative for appetite change, chills, fatigue and fever.  HENT: Negative for congestion, dental problem, ear pain, hearing loss, sore throat, tinnitus, trouble swallowing and voice change.   Eyes: Negative for pain, discharge and visual disturbance.  Respiratory:  Negative for cough, chest tightness, wheezing and stridor.   Cardiovascular: Positive for leg swelling. Negative for chest pain and palpitations.  Gastrointestinal: Negative for abdominal distention, abdominal pain, blood in stool, constipation, diarrhea, nausea and vomiting.  Genitourinary: Negative for difficulty urinating, discharge, flank pain, genital sores, hematuria and urgency.  Musculoskeletal: Negative for arthralgias, back pain, gait problem, joint swelling, myalgias and neck stiffness.  Skin: Positive for rash.  Neurological: Negative for dizziness, syncope, speech difficulty, weakness, numbness and headaches.  Hematological: Negative for adenopathy. Does not bruise/bleed easily.  Psychiatric/Behavioral: Positive for confusion and decreased concentration. Negative for behavioral problems and dysphoric mood. The patient is not nervous/anxious.        Objective:   Physical Exam  Constitutional: He is oriented to person, place, and time. He appears well-developed.  Blood pressure 100/60  HENT:  Head: Normocephalic.  Right Ear: External ear normal.  Left Ear: External ear normal.  Eyes: Conjunctivae and EOM are normal.  Neck: Normal range of motion.  Cardiovascular: Normal rate and normal heart sounds.   Pulmonary/Chest: Breath sounds normal. No respiratory distress. He has no wheezes. He has no rales.  Abdominal: Bowel sounds are normal.  Musculoskeletal: Normal range of motion. He exhibits edema. He exhibits no tenderness.  Neurological: He is alert and oriented to person, place, and time.  Skin:  Nonspecific erythema of the lower extremity with some mild scattered petechial lesions  Psychiatric: He has a normal mood and affect. His behavior is normal.          Assessment & Plan:   Persistent hyponatremia.  We'll continue liberal salt intake but will restrict fluids Follow next week for his annual exam and follow-up lab.  He'll report any clinical  worsening Hyponatremia with low serum osmolality.  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  This seems to be improving with improvement of electrolyte disturbance.  We'll reassess next week BPH.  In view of his hypotension Cialis will not be started.  He has had a difficult time getting coverage  Discontinue meloxicam  CPX next week as scheduled  Nyoka Cowden

## 2015-12-23 NOTE — Progress Notes (Signed)
Pre visit review using our clinic review tool, if applicable. No additional management support is needed unless otherwise documented below in the visit note. 

## 2015-12-25 DIAGNOSIS — R609 Edema, unspecified: Secondary | ICD-10-CM | POA: Diagnosis not present

## 2015-12-25 DIAGNOSIS — Z23 Encounter for immunization: Secondary | ICD-10-CM | POA: Diagnosis not present

## 2015-12-25 DIAGNOSIS — L309 Dermatitis, unspecified: Secondary | ICD-10-CM | POA: Diagnosis not present

## 2015-12-31 ENCOUNTER — Encounter: Payer: Self-pay | Admitting: Internal Medicine

## 2015-12-31 ENCOUNTER — Ambulatory Visit (INDEPENDENT_AMBULATORY_CARE_PROVIDER_SITE_OTHER): Payer: Medicare Other | Admitting: Internal Medicine

## 2015-12-31 VITALS — BP 102/60 | HR 67 | Temp 98.4°F | Resp 18 | Ht 65.5 in | Wt 143.5 lb

## 2015-12-31 DIAGNOSIS — Z860100 Personal history of colon polyps, unspecified: Secondary | ICD-10-CM

## 2015-12-31 DIAGNOSIS — R413 Other amnesia: Secondary | ICD-10-CM

## 2015-12-31 DIAGNOSIS — Z23 Encounter for immunization: Secondary | ICD-10-CM

## 2015-12-31 DIAGNOSIS — M150 Primary generalized (osteo)arthritis: Secondary | ICD-10-CM | POA: Diagnosis not present

## 2015-12-31 DIAGNOSIS — M159 Polyosteoarthritis, unspecified: Secondary | ICD-10-CM

## 2015-12-31 DIAGNOSIS — Z0000 Encounter for general adult medical examination without abnormal findings: Secondary | ICD-10-CM

## 2015-12-31 NOTE — Progress Notes (Signed)
Patient ID: Benjamin Cardenas, male   DOB: 12-Dec-1929, 82 y.o.   MRN: [id number]  Subjective:    Patient ID: Benjamin Cardenas, male    DOB: 04-01-1930, 82 y.o.   MRN: [id number]  HPI   History of Present Illness:   82    year-old patient who is seen today for a comprehensive evaluation.  He has a history of colonic polyps osteoarthritis. Neck pain. BPH. He is followed by urology annually.  He has no concerns or complaints today.  He has had a left inguinal  hernia repair. He complains of some memory concerns;  MMSE 2014  was normal.  This was repeated 2016  and again had a score of 30/30 He has seen Dr. Earlean Shawl earlier 2016 with colonoscopy and EGD with dilatation.  He is now followed closely by urology due to bladder cancer.  He continues to receive intravesical BCG treatment.  He has had some occasional intermittent gross hematuria. He was hospitalized briefly due to mental confusion in the setting of significant hyponatremia associated with urinary retention.  Recent follow-up left lites had normalized.     Here for Medicare AWV:   1. Risk factors based on Past M, S, F history: no significant cardiovascular risk factors. Family history is positive for colon cancer in a first-degree relative  2. Physical Activities: exercises regularly 3 to 4 times per week. This includes walking and swimming  3. Depression/mood: no history of depression or mood disorder  4. Hearing: no deficits  5. ADL's: completely independent in all aspects of daily living  6. Fall Risk: low  7. Home Safety: no problems identified  8. Height, weight, &visual acuity:no change in height, or weight no difficulties with visual acuity has had a recent eye exam  9. Counseling: heart healthy diet, ongoing regular exercise regimen discussed and encouraged  10. Labs ordered based on risk factors: Recent laboratory studies from hospital admission reviewed  11. Referral Coordination- follow-up with the urology and GI 12. Care  Plan- continued heart healthy diet regular. Exercise regimen  13. Cognitive Assessment- alert and oriented, with normal affect  14.  Preventive services will include annual clinical exams with screening lab.  No further colonoscopies will be entertained 15.  Provider list includes primary care medicine and ophthalmology GI and urology  Allergies:   1) ! * Z Pack  2) ! * Plague Vaccine  3) ! Amoxicillin  4) ! Sulfa  5) Cipro  6) Doxycycline Hyclate (Doxycycline Hyclate)  7) Levaquin (Levofloxacin)   Past History:  Past Medical History:    High cholesterol  Colonic polyps, hx of  Benign prostatic hypertrophy  neck pain  left hip extensor, weakness  Osteoarthritis   Past Surgical History:    Colon polypectomy  status post right angle hernia repair at age 40  colonoscopy 2007  2010 [facility]) February 2016 negative for treadmill ETT May 2008  Left inguinal hernia repair  11/12  Family History:    Family History Breast cancer 1st degree relative <50  Family History of Colon CA 1st degree relative <60  brother with colon cancer Family History of Stroke F 1st degree relative <60  Family History of Cardiovascular disorder   father died at 57, MI  mother died age 42 complications of cerebrovascular disease, pneumonia; history of congestive heart failure  One brother. History colon cancer, status post CABG  one sister died of breast cancer at age 19   Social History:   Occupation: retired Married  Former Smoker  Past Medical History:  Diagnosis Date  . Bladder cancer [facility]) urology-- dr Karsten Ro   dx 11/ 2016  Transitional cell carcinoma, High grade invasive , Stage T1  G3 ---  s/p  TURBT and Mitomycin C instillation  . BPH with urinary obstruction   . Dry skin dermatitis   . Elevated PSA   . History of colon polyps   . Hydronephrosis, left   . Lower urinary tract symptoms (LUTS)   . OA (osteoarthritis)   . Prostate nodule   . Seasonal allergic rhinitis   . Wears  glasses    Past Surgical History:  Procedure Laterality Date  . CYSTOSCOPY WITH BIOPSY N/A 04/22/2015   Procedure: CYSTOSCOPY WITH BLADDER BIOPSY;  Surgeon: Kathie Rhodes, MD;  Location: [facility];  Service: Urology;  Laterality: N/A;  . CYSTOSCOPY WITH RETROGRADE PYELOGRAM, URETEROSCOPY AND STENT PLACEMENT Left 02/25/2015   Procedure: LEFT RETROGRADE PYELOGRAM, URETEROSCOPY ;  Surgeon: Kathie Rhodes, MD;  Location: [facility];  Service: Urology;  Laterality: Left;  . HERNIA REPAIR  age 36 1937   rupture right inguinal hernia  . INGUINAL HERNIA REPAIR  03/02/2011   Procedure: HERNIA REPAIR INGUINAL ADULT;  Surgeon: Earnstine Regal, MD;  Location: [facility];  Service: General;  Laterality: Left;  with mesh   . TRANSURETHRAL RESECTION OF BLADDER TUMOR WITH GYRUS (TURBT-GYRUS) N/A 02/25/2015   Procedure: TRANSURETHRAL RESECTION OF BLADDER TUMOR WITH GYRUS (TURBT-GYRUS);  Surgeon: Kathie Rhodes, MD;  Location: [facility];  Service: Urology;  Laterality: N/A;    reports that he quit smoking about 36 years ago. His smoking use included Cigarettes. He has a 51.00 pack-year smoking history. He has never used smokeless tobacco. He reports that he drinks alcohol. He reports that he does not use drugs. family history includes Cancer in his brother and sister; Heart disease in his father and mother. Allergies  Allergen Reactions  . Sulfa Antibiotics Swelling  . Amoxicillin Rash    Has patient had a PCN reaction causing immediate rash, facial/tongue/throat swelling, SOB or lightheadedness with hypotension:No Has patient had a PCN reaction causing severe rash involving mucus membranes or skin necrosis:unsure Has patient had a PCN reaction that required hospitalization:No Has patient had a PCN reaction occurring within the last 10 years:unsure If all of the above answers are "NO", then may proceed with Cephalosporin use.   . Ciprofloxacin Swelling and Rash  .  Doxycycline Hyclate Rash  . Levofloxacin Rash  chx   Review of Systems  Constitutional: Negative for activity change, appetite change, chills, fatigue and fever.  HENT: Negative for congestion, dental problem, ear pain, hearing loss, mouth sores, rhinorrhea, sinus pressure, sneezing, tinnitus, trouble swallowing and voice change.   Eyes: Negative for photophobia, pain, redness and visual disturbance.  Respiratory: Negative for apnea, cough, choking, chest tightness, shortness of breath and wheezing.   Cardiovascular: Negative for chest pain, palpitations and leg swelling.  Gastrointestinal: Negative for abdominal distention, abdominal pain, anal bleeding, blood in stool, constipation, diarrhea, nausea, rectal pain and vomiting.  Genitourinary: Negative for decreased urine volume, difficulty urinating, discharge, dysuria, flank pain, frequency, genital sores, hematuria, penile swelling, scrotal swelling, testicular pain and urgency.  Musculoskeletal: Negative for arthralgias, back pain, gait problem, joint swelling, myalgias, neck pain and neck stiffness.  Skin: Positive for rash. Negative for color change and wound.       Followed by dermatology for lichen planus  Neurological: Negative for dizziness, tremors, seizures, syncope, facial asymmetry, speech difficulty, weakness,  light-headedness, numbness and headaches.  Hematological: Negative for adenopathy. Does not bruise/bleed easily.  Psychiatric/Behavioral: Negative for agitation, behavioral problems, confusion, decreased concentration, dysphoric mood, hallucinations, self-injury, sleep disturbance and suicidal ideas. The patient is not nervous/anxious.        Objective:   Physical Exam  Constitutional: He is oriented to person, place, and time. He appears well-developed and well-nourished.  HENT:  Head: Normocephalic and atraumatic.  Right Ear: External ear normal.  Left Ear: External ear normal.  Nose: Nose normal.  Mouth/Throat:  Oropharynx is clear and moist.  Eyes: Conjunctivae and EOM are normal. Pupils are equal, round, and reactive to light. No scleral icterus.  Neck: Normal range of motion. Neck supple. No JVD present. No thyromegaly present.  Cardiovascular: Normal rate, regular rhythm, normal heart sounds and intact distal pulses.  Exam reveals no gallop and no friction rub.   No murmur heard. Pulses are faint.  Posterior tibial pulses are full Faint right femoral bruit  Pulmonary/Chest: Effort normal and breath sounds normal. He exhibits no tenderness.  Abdominal: Soft. Bowel sounds are normal. He exhibits no distension and no mass. There is no tenderness.  Genitourinary: Prostate normal and penis normal.  Genitourinary Comments: Prostate +3 enlarged (2014)  External hemorrhoidal tags (2014)  Exam deferred 2015 due to urology referral  Musculoskeletal: Normal range of motion. He exhibits edema. He exhibits no tenderness.  Lymphadenopathy:    He has no cervical adenopathy.  Neurological: He is alert and oriented to person, place, and time. He has normal reflexes. No cranial nerve deficit. Coordination normal.  Left patellar reflex absent  Skin: Skin is warm and dry. No rash noted.  Psychiatric: He has a normal mood and affect. His behavior is normal.          Assessment & Plan:   Annual clinical exam BPH   Bladder cancer.  Follow-up urology.  Presently on intravesical BCG therapy Osteoarthritis History of colonic polyps; status post colonoscopy February 2016  Heart healthy diet,  regular exercise encouraged. Will reassess in one year or as needed Follow-up urology  Annual flu vaccine  Return in 6 months or as needed  [employer]

## 2015-12-31 NOTE — Patient Instructions (Signed)
Urology follow-up as scheduled    It is important that you exercise regularly, at least 20 minutes 3 to 4 times per week.  If you develop chest pain or shortness of breath seek  medical attention.  Please check your blood pressure on a regular basis.  If it is consistently greater than 150/90, please make an office appointment.  Return in 6 months for follow-up

## 2016-01-08 DIAGNOSIS — R609 Edema, unspecified: Secondary | ICD-10-CM | POA: Diagnosis not present

## 2016-01-08 DIAGNOSIS — Z23 Encounter for immunization: Secondary | ICD-10-CM | POA: Diagnosis not present

## 2016-01-08 DIAGNOSIS — L309 Dermatitis, unspecified: Secondary | ICD-10-CM | POA: Diagnosis not present

## 2016-01-25 ENCOUNTER — Encounter (HOSPITAL_COMMUNITY): Payer: Self-pay | Admitting: *Deleted

## 2016-01-25 ENCOUNTER — Emergency Department (HOSPITAL_COMMUNITY)
Admission: EM | Admit: 2016-01-25 | Discharge: 2016-01-25 | Disposition: A | Discharge status: Home / Self Care | Payer: Medicare Other | Attending: Emergency Medicine | Admitting: Emergency Medicine

## 2016-01-25 DIAGNOSIS — Z7982 Long term (current) use of aspirin: Secondary | ICD-10-CM | POA: Insufficient documentation

## 2016-01-25 DIAGNOSIS — N390 Urinary tract infection, site not specified: Secondary | ICD-10-CM | POA: Diagnosis not present

## 2016-01-25 DIAGNOSIS — Z8551 Personal history of malignant neoplasm of bladder: Secondary | ICD-10-CM | POA: Diagnosis not present

## 2016-01-25 DIAGNOSIS — Z87891 Personal history of nicotine dependence: Secondary | ICD-10-CM | POA: Diagnosis not present

## 2016-01-25 DIAGNOSIS — Z79899 Other long term (current) drug therapy: Secondary | ICD-10-CM | POA: Diagnosis not present

## 2016-01-25 DIAGNOSIS — R300 Dysuria: Secondary | ICD-10-CM | POA: Diagnosis present

## 2016-01-25 LAB — BASIC METABOLIC PANEL
ANION GAP: 7 (ref 5–15)
BUN: 14 mg/dL (ref 6–20)
CALCIUM: 9.4 mg/dL (ref 8.9–10.3)
CO2: 25 mmol/L (ref 22–32)
Chloride: 104 mmol/L (ref 101–111)
Creatinine, Ser: 0.7 mg/dL (ref 0.61–1.24)
Glucose, Bld: 97 mg/dL (ref 65–99)
POTASSIUM: 3.8 mmol/L (ref 3.5–5.1)
SODIUM: 136 mmol/L (ref 135–145)

## 2016-01-25 LAB — URINE MICROSCOPIC-ADD ON: SQUAMOUS EPITHELIAL / LPF: NONE SEEN

## 2016-01-25 LAB — CBC
HEMATOCRIT: 35.9 % — AB (ref 39.0–52.0)
HEMOGLOBIN: 12.2 g/dL — AB (ref 13.0–17.0)
MCH: 30.6 pg (ref 26.0–34.0)
MCHC: 34 g/dL (ref 30.0–36.0)
MCV: 90 fL (ref 78.0–100.0)
Platelets: 230 10*3/uL (ref 150–400)
RBC: 3.99 MIL/uL — AB (ref 4.22–5.81)
RDW: 14.2 % (ref 11.5–15.5)
WBC: 6.6 10*3/uL (ref 4.0–10.5)

## 2016-01-25 LAB — URINALYSIS, ROUTINE W REFLEX MICROSCOPIC
BILIRUBIN URINE: NEGATIVE
Glucose, UA: NEGATIVE mg/dL
Hgb urine dipstick: NEGATIVE
Ketones, ur: NEGATIVE mg/dL
NITRITE: POSITIVE — AB
PH: 7 (ref 5.0–8.0)
Protein, ur: 30 mg/dL — AB
SPECIFIC GRAVITY, URINE: 1.01 (ref 1.005–1.030)

## 2016-01-25 MED ORDER — CEPHALEXIN 500 MG PO CAPS: 500.0000 mg | ORAL_CAPSULE | Freq: Four times a day (QID) | ORAL | 0 refills | Status: DC

## 2016-01-25 NOTE — ED Provider Notes (Signed)
[facility] DEPT Provider Note   CSN: [id number] Arrival date & time: 01/25/16  2751     History   Chief Complaint Chief Complaint  Patient presents with  . Dysuria  . Abdominal Pain    lower    HPI Benjamin Cardenas is a 82 y.o. male.  82 year old male presents with acute onset of twitching to his lower extremities when he was asleep. Has not had any symptoms while he's been awake. Denies any seizure activity. Similar symptoms in the past associated with hyponatremia. Denies fever or chills. No muscle aches. No trouble with his gait. Symptoms appear to have resolved at this point. No treatment used for this prior to arrival. Patient also has a secondary complaint of bladder discomfort and is currently seen by a urologist for this. States that this is unchanged.      Past Medical History:  Diagnosis Date  . Bladder cancer [facility]) urology-- dr Karsten Ro   dx 11/ 2016  Transitional cell carcinoma, High grade invasive , Stage T1  G3 ---  s/p  TURBT and Mitomycin C instillation  . BPH with urinary obstruction   . Dry skin dermatitis   . Elevated PSA   . History of colon polyps   . Hydronephrosis, left   . Lower urinary tract symptoms (LUTS)   . OA (osteoarthritis)   . Prostate nodule   . Seasonal allergic rhinitis   . Wears glasses     Patient Active Problem List   Diagnosis Date Noted  . Urinary tract infection 12/14/2015  . Hyponatremia 12/13/2015  . Anemia 12/13/2015  . Altered mental status 12/13/2015  . Constipation 08/17/2015  . Memory impairment 12/04/2014  . Inguinal hernia unilateral, non-recurrent, left 01/28/2011  . OTHER SYMPTOMS INVOLVING CARDIOVASCULAR SYSTEM 09/20/2009  . Osteoarthritis 01/31/2009  . NECK PAIN 02/15/2007  . BPH (benign prostatic hyperplasia) 01/06/2007  . History of colonic polyps 01/06/2007    Past Surgical History:  Procedure Laterality Date  . CYSTOSCOPY WITH BIOPSY N/A 04/22/2015   Procedure: CYSTOSCOPY WITH BLADDER BIOPSY;   Surgeon: Kathie Rhodes, MD;  Location: [facility];  Service: Urology;  Laterality: N/A;  . CYSTOSCOPY WITH RETROGRADE PYELOGRAM, URETEROSCOPY AND STENT PLACEMENT Left 02/25/2015   Procedure: LEFT RETROGRADE PYELOGRAM, URETEROSCOPY ;  Surgeon: Kathie Rhodes, MD;  Location: [facility];  Service: Urology;  Laterality: Left;  . HERNIA REPAIR  age 44 1937   rupture right inguinal hernia  . INGUINAL HERNIA REPAIR  03/02/2011   Procedure: HERNIA REPAIR INGUINAL ADULT;  Surgeon: Earnstine Regal, MD;  Location: [facility];  Service: General;  Laterality: Left;  with mesh   . TRANSURETHRAL RESECTION OF BLADDER TUMOR WITH GYRUS (TURBT-GYRUS) N/A 02/25/2015   Procedure: TRANSURETHRAL RESECTION OF BLADDER TUMOR WITH GYRUS (TURBT-GYRUS);  Surgeon: Kathie Rhodes, MD;  Location[facility];  Service: Urology;  Laterality: N/A;       Home Medications    Prior to Admission medications   Medication Sig Start Date End Date Taking? Authorizing Provider  alfuzosin (UROXATRAL) 10 MG 24 hr tablet Take 10 mg by mouth daily at 12 noon.  11/11/15   Historical Provider, MD  aspirin EC 81 MG tablet Take 81 mg by mouth daily.    Historical Provider, MD  cetirizine (ZYRTEC) 10 MG tablet Take 10 mg by mouth daily as needed for allergies.    Historical Provider, MD  HYDROcodone-acetaminophen (NORCO) 10-325 MG tablet Take 1-2 tablets by mouth every 4 (four) hours as needed for  moderate pain. Maximum dose per 24 hours - 8 pills 04/22/15   Kathie Rhodes, MD  naphazoline-pheniramine (NAPHCON-A) 0.025-0.3 % ophthalmic solution Place 1 drop into both eyes 2 (two) times daily.    Historical Provider, MD  phenazopyridine (PYRIDIUM) 200 MG tablet Take 1 tablet (200 mg total) by mouth 3 (three) times daily as needed for pain. 04/22/15   Kathie Rhodes, MD  polyethylene glycol [facility] / GLYCOLAX) packet Take 17-34 g by mouth daily.    Historical Provider, MD  triamcinolone ointment (KENALOG) 0.1 % Apply  1 application topically 2 (two) times daily.  12/25/15   Historical Provider, MD  TRULANCE 3 MG TABS Take 3 mg by mouth every morning.  10/29/15   Historical Provider, MD  Wheat Dextrin (BENEFIBER DRINK MIX PO) Take 5 mLs by mouth daily.     Historical Provider, MD    Family History Family History  Problem Relation Age of Onset  . Heart disease Mother   . Heart disease Father   . Cancer Sister     breast  . Cancer Brother     colon    Social History Social History  Substance Use Topics  . Smoking status: Former Smoker    Packs/day: 1.50    Years: 34.00    Types: Cigarettes    Quit date: 02/17/1979  . Smokeless tobacco: Never Used  . Alcohol use Yes     Comment: occasional     Allergies   Sulfa antibiotics; Amoxicillin; Ciprofloxacin; Doxycycline hyclate; and Levofloxacin   Review of Systems Review of Systems  All other systems reviewed and are negative.    Physical Exam Updated Vital Signs BP 111/75 (BP Location: Left Arm)   Pulse 78   Temp 97.6 F (36.4 C) (Oral)   Resp 18   Ht 5' 5.5" (1.664 m)   Wt 61.2 kg   SpO2 97%   BMI 22.12 kg/m   Physical Exam  Constitutional: He is oriented to person, place, and time. He appears well-developed and well-nourished.  Non-toxic appearance. No distress.  HENT:  Head: Normocephalic and atraumatic.  Eyes: Conjunctivae, EOM and lids are normal. Pupils are equal, round, and reactive to light.  Neck: Normal range of motion. Neck supple. No tracheal deviation present. No thyroid mass present.  Cardiovascular: Normal rate, regular rhythm and normal heart sounds.  Exam reveals no gallop.   No murmur heard. Pulmonary/Chest: Effort normal and breath sounds normal. No stridor. No respiratory distress. He has no decreased breath sounds. He has no wheezes. He has no rhonchi. He has no rales.  Abdominal: Soft. Normal appearance and bowel sounds are normal. He exhibits no distension. There is no tenderness. There is no rebound and no  CVA tenderness.  Musculoskeletal: Normal range of motion. He exhibits no edema or tenderness.  Neurological: He is alert and oriented to person, place, and time. He has normal strength. No cranial nerve deficit or sensory deficit. GCS eye subscore is 4. GCS verbal subscore is 5. GCS motor subscore is 6.  Skin: Skin is warm and dry. No abrasion and no rash noted.  Psychiatric: He has a normal mood and affect. His speech is normal and behavior is normal.  Nursing note and vitals reviewed.    ED Treatments / Results  Labs (all labs ordered are listed, but only abnormal results are displayed) Labs Reviewed  CBC - Abnormal; Notable for the following:       Result Value   RBC 3.99 (*)    Hemoglobin  12.2 (*)    HCT 35.9 (*)    All other components within normal limits  BASIC METABOLIC PANEL  URINALYSIS, ROUTINE W REFLEX MICROSCOPIC (NOT AT [facility])    EKG  EKG Interpretation None       Radiology No results found.  Procedures Procedures (including critical care time)  Medications Ordered in ED Medications - No data to display   Initial Impression / Assessment and Plan / ED Course  I have reviewed the triage vital signs and the nursing notes.  Pertinent labs & imaging results that were available during my care of the patient were reviewed by me and considered in my medical decision making (see chart for details).  Clinical Course    Patient's labs reviewed and no evidence of hyponatremia. Urinalysis consistent with infection and patient asymptomatic with dysuria and will be placed on antibiotics and will follow-up with his doctor.  Final Clinical Impressions(s) / ED Diagnoses   Final diagnoses:  None    New Prescriptions New Prescriptions   No medications on file     Lacretia Leigh, MD 01/25/16 1135

## 2016-01-25 NOTE — ED Triage Notes (Signed)
Patient presents with "twitching and cramping" in his muscles and history of same.  Patient wears compression stockings for muscle cramps.  Patient has a recent hx of hyponatremia and hyperkalemia.  Patient also c/o urinary frequency x2 months and is being followed/worked up by Dr. Karsten Ro for that.  Patient's wife states sometimes patient seems mildly confused at night, but otherwise, is at baseline mental status.  Patient denies pain except for dysuria.

## 2016-01-25 NOTE — Discharge Instructions (Signed)
Call dr Consuella Lose on Monday to schedule a follow-up appointment

## 2016-01-27 ENCOUNTER — Telehealth: Payer: Self-pay

## 2016-01-27 NOTE — Telephone Encounter (Signed)
Patient Name: SUKHDEEP WIETING Gender: Male DOB: [birth year] Age: 82 Y 30 M 21 D Return Phone Number: [phone] (Primary), [phone] (Secondary) Address[place] [address] Client [facility] Night - Client Client Site [facility] - Night Physician Simonne Martinet - MD Contact Type Call Who Is Calling Patient / Member / Family / Caregiver Call Type Triage / Clinical Caller Name Pamala Hurry Relationship To Patient Spouse Return Phone Number [phone] (Primary) Chief Complaint Muscle Jerks, Tics And Shudders Reason for Call Symptomatic / Request for Health Information Initial Comment caller states husband twitching and having leg cramps PreDisposition Home Care Translation No Nurse Assessment Nurse: Thad Ranger, RN, Langley Gauss Date/Time (Eastern Time): 01/25/2016 7:14:54 AM Confirm and document reason for call. If symptomatic, describe symptoms. You must click the next button to save text entered. ---Caller states husband twitching and having leg cramps that started in Sept but got worse over night. Has the patient traveled out of the country within the last 30 days? ---Not Applicable Does the patient have any new or worsening symptoms? ---Yes Will a triage be completed? ---Yes Related visit to physician within the last 2 weeks? ---No Does the PT have any chronic conditions? (i.e. diabetes, asthma, etc.) ---Yes List chronic conditions. ---Bladder cancer Is this a behavioral health or substance abuse call? ---No Guidelines Guideline Title Affirmed Question Affirmed Notes Nurse Date/Time (Eastern Time) Muscle Aches and Body Pain [1] SEVERE pain (e.g., excruciating, unable to do any normal activities) AND [2] not improved 2 hours after pain medicine Carmon, RN, Langley Gauss 01/25/2016 7:17:39 AM Disp. Time Eilene Ghazi Time) Disposition Final

## 2016-01-31 NOTE — Telephone Encounter (Signed)
Error

## 2016-02-04 DIAGNOSIS — Z8551 Personal history of malignant neoplasm of bladder: Secondary | ICD-10-CM | POA: Diagnosis not present

## 2016-02-04 DIAGNOSIS — N3020 Other chronic cystitis without hematuria: Secondary | ICD-10-CM | POA: Diagnosis not present

## 2016-02-20 DIAGNOSIS — C679 Malignant neoplasm of bladder, unspecified: Secondary | ICD-10-CM | POA: Diagnosis not present

## 2016-02-25 DIAGNOSIS — R350 Frequency of micturition: Secondary | ICD-10-CM | POA: Diagnosis not present

## 2016-02-25 DIAGNOSIS — R8279 Other abnormal findings on microbiological examination of urine: Secondary | ICD-10-CM | POA: Diagnosis not present

## 2016-02-25 DIAGNOSIS — Z8551 Personal history of malignant neoplasm of bladder: Secondary | ICD-10-CM | POA: Diagnosis not present

## 2016-03-26 DIAGNOSIS — M7541 Impingement syndrome of right shoulder: Secondary | ICD-10-CM | POA: Diagnosis not present

## 2016-03-26 DIAGNOSIS — M7540 Impingement syndrome of unspecified shoulder: Secondary | ICD-10-CM | POA: Diagnosis not present

## 2016-03-26 DIAGNOSIS — M25511 Pain in right shoulder: Secondary | ICD-10-CM | POA: Diagnosis not present

## 2016-04-10 DIAGNOSIS — M7541 Impingement syndrome of right shoulder: Secondary | ICD-10-CM | POA: Diagnosis not present

## 2016-04-14 DIAGNOSIS — M7541 Impingement syndrome of right shoulder: Secondary | ICD-10-CM | POA: Diagnosis not present

## 2016-04-16 DIAGNOSIS — M7541 Impingement syndrome of right shoulder: Secondary | ICD-10-CM | POA: Diagnosis not present

## 2016-04-21 DIAGNOSIS — M7541 Impingement syndrome of right shoulder: Secondary | ICD-10-CM | POA: Diagnosis not present

## 2016-04-28 DIAGNOSIS — M7541 Impingement syndrome of right shoulder: Secondary | ICD-10-CM | POA: Diagnosis not present

## 2016-04-29 DIAGNOSIS — M7541 Impingement syndrome of right shoulder: Secondary | ICD-10-CM | POA: Diagnosis not present

## 2016-04-30 DIAGNOSIS — M7541 Impingement syndrome of right shoulder: Secondary | ICD-10-CM | POA: Diagnosis not present

## 2016-05-05 DIAGNOSIS — M7541 Impingement syndrome of right shoulder: Secondary | ICD-10-CM | POA: Diagnosis not present

## 2016-05-07 DIAGNOSIS — M7541 Impingement syndrome of right shoulder: Secondary | ICD-10-CM | POA: Diagnosis not present

## 2016-05-12 DIAGNOSIS — L853 Xerosis cutis: Secondary | ICD-10-CM | POA: Diagnosis not present

## 2016-05-12 DIAGNOSIS — M7541 Impingement syndrome of right shoulder: Secondary | ICD-10-CM | POA: Diagnosis not present

## 2016-05-12 DIAGNOSIS — L308 Other specified dermatitis: Secondary | ICD-10-CM | POA: Diagnosis not present

## 2016-05-12 DIAGNOSIS — Z23 Encounter for immunization: Secondary | ICD-10-CM | POA: Diagnosis not present

## 2016-05-14 DIAGNOSIS — M7541 Impingement syndrome of right shoulder: Secondary | ICD-10-CM | POA: Diagnosis not present

## 2016-05-19 DIAGNOSIS — M7541 Impingement syndrome of right shoulder: Secondary | ICD-10-CM | POA: Diagnosis not present

## 2016-05-22 DIAGNOSIS — M7541 Impingement syndrome of right shoulder: Secondary | ICD-10-CM | POA: Diagnosis not present

## 2016-05-26 DIAGNOSIS — Z8551 Personal history of malignant neoplasm of bladder: Secondary | ICD-10-CM | POA: Diagnosis not present

## 2016-05-26 DIAGNOSIS — R300 Dysuria: Secondary | ICD-10-CM | POA: Diagnosis not present

## 2016-05-26 DIAGNOSIS — R8279 Other abnormal findings on microbiological examination of urine: Secondary | ICD-10-CM | POA: Diagnosis not present

## 2016-06-25 ENCOUNTER — Ambulatory Visit (INDEPENDENT_AMBULATORY_CARE_PROVIDER_SITE_OTHER): Payer: Medicare Other | Admitting: Family Medicine

## 2016-06-25 ENCOUNTER — Encounter: Payer: Self-pay | Admitting: Family Medicine

## 2016-06-25 ENCOUNTER — Telehealth: Payer: Self-pay | Admitting: Internal Medicine

## 2016-06-25 VITALS — BP 130/68 | HR 112 | Temp 103.0°F | Resp 16 | Ht 65.5 in | Wt 151.3 lb

## 2016-06-25 DIAGNOSIS — R509 Fever, unspecified: Secondary | ICD-10-CM

## 2016-06-25 DIAGNOSIS — J069 Acute upper respiratory infection, unspecified: Secondary | ICD-10-CM

## 2016-06-25 DIAGNOSIS — R413 Other amnesia: Secondary | ICD-10-CM

## 2016-06-25 DIAGNOSIS — K5901 Slow transit constipation: Secondary | ICD-10-CM | POA: Diagnosis not present

## 2016-06-25 LAB — POC INFLUENZA A&B (BINAX/QUICKVUE)
INFLUENZA A, POC: NEGATIVE
INFLUENZA B, POC: NEGATIVE

## 2016-06-25 MED ORDER — KETOROLAC TROMETHAMINE 60 MG/2ML IM SOLN
60.0000 mg | Freq: Once | INTRAMUSCULAR | Status: AC
  Administered 2016-06-25: 60 mg via INTRAMUSCULAR

## 2016-06-25 MED ORDER — OSELTAMIVIR PHOSPHATE 75 MG PO CAPS: 75.0000 mg | ORAL_CAPSULE | Freq: Two times a day (BID) | ORAL | 0 refills | Status: DC

## 2016-06-25 NOTE — Progress Notes (Signed)
Pre visit review using our clinic review tool, if applicable. No additional management support is needed unless otherwise documented below in the visit note. 

## 2016-06-25 NOTE — Patient Instructions (Signed)
  [name] I have seen you today for an acute visit.  A few things to remember from today's visit:   Fever, unspecified fever cause - Plan: POC Influenza A&B (Binax test), CBC with Differential/Platelet, Basic metabolic panel, Culture, Urine, POCT Urinalysis Dipstick (Automated), ketorolac (TORADOL) injection 60 mg  URI, acute  Slow transit constipation         Medications prescribed today are intended for short period of time and will not be refill upon request, a follow up appointment might be necessary to discuss continuation of of treatment if appropriate.     In general please monitor for signs of worsening symptoms and seek immediate medical attention if any concerning.  If symptoms are not resolved in a few days/weeks you should schedule a follow up appointment with your doctor, before if needed.  Please be sure you have an appointment already scheduled with your PCP before you leave today.

## 2016-06-25 NOTE — Telephone Encounter (Signed)
Patient Name: Benjamin Cardenas  DOB: October 31, 1929    Initial Comment Caller states has a drippy nose, chest tightness, chills   Nurse Assessment  Nurse: Raphael Gibney, RN, Vera Date/Time (Eastern Time): 06/25/2016 11:55:43 AM  Confirm and document reason for call. If symptomatic, describe symptoms. ---Caller states he started having problems breathing through his left nostril about a week ago. Has had mucus dripping from his nose. Has nasal congestion. Has had chills. Does not have a thermometer. no sneezing. has cough. Has taken mucinex DM. Takes zyrtec. Has chest pressure. has used neti pot. Has had cough for 2 days.  Does the patient have any new or worsening symptoms? ---Yes  Will a triage be completed? ---Yes  Related visit to physician within the last 2 weeks? ---No  Does the PT have any chronic conditions? (i.e. diabetes, asthma, etc.) ---Yes  List chronic conditions. ---history of bladder cancer  Is this a behavioral health or substance abuse call? ---No     Guidelines    Guideline Title Affirmed Question Affirmed Notes  Chest Pain Taking a deep breath makes pain worse    Final Disposition User   Go to ED Now (or PCP triage) Raphael Gibney, RN, Vanita Ingles    Comments  office is open; appt scheduled for 06/25/2016 at 2:45 pm with Dr. Betty Martinique vs sending pt to the ER   Referrals  REFERRED TO PCP OFFICE   Disagree/Comply: Comply

## 2016-06-25 NOTE — Progress Notes (Signed)
HPI:   ACUTE VISIT:  Chief Complaint  Patient presents with  . Fever,chills,nasal congestion    [name] is a 83 y.o. male, who is here today with his wife complaining of 2 days of nasal congestion and postnasal drainage. Today noted fever 103 F,"shaking",some confusion ("memory impairment" listed on problem list). "Some"global headache, denies associated visual changes,nausea,or vomiting. Mild,non productive cough. Denies chest pain (although this was CC when appt was arranged),dyspnea or wheezing.  + Myalgias,fatigue,and shivery. Denies seizure activity or LOC. He has not taken any antipyretic today. No known sick contact and no recent travel.  Hx of bladder cancer, follows with urology q 3 months, BCG treatments in the past. No new urinary symptom.According to wife,his urine is "always" cloudy.No gross hematuria,changes in urinary frequency. + Suprapubic abdominal pain ,intermittently,also chronic and stable. + Hx if constipation,last bowel movement this morning,small, denies blood in stool. He is currently on Trulance 3 mg and Benefiber, he tells me that he also has loose stools sometimes.  Hx of hypoNa++,wife is concerned about this, in the past this has been the cause of MS changes.   Denies worsening on LE edema,orthopnea,or PND.  Lab Results  Component Value Date   CREATININE 0.70 01/25/2016   BUN 14 01/25/2016   NA 136 01/25/2016   K 3.8 01/25/2016   CL 104 01/25/2016   CO2 25 01/25/2016     Review of Systems  Constitutional: Positive for activity change, appetite change, chills, fatigue and fever.  HENT: Positive for congestion and rhinorrhea. Negative for ear pain, mouth sores, postnasal drip, sneezing, sore throat, trouble swallowing and voice change.   Eyes: Negative for discharge, redness and visual disturbance.  Respiratory: Positive for cough (minimal). Negative for shortness of breath and wheezing.   Cardiovascular: Positive for leg  swelling. Negative for chest pain and palpitations.  Gastrointestinal: Positive for abdominal pain, constipation and diarrhea. Negative for nausea and vomiting.  Musculoskeletal: Positive for myalgias. Negative for back pain and joint swelling.  Skin: Negative for rash.  Neurological: Positive for headaches. Negative for seizures, syncope and weakness.  Hematological: Negative for adenopathy. Does not bruise/bleed easily.  Psychiatric/Behavioral: Positive for confusion. Negative for hallucinations.      Current Outpatient Prescriptions on File Prior to Visit  Medication Sig Dispense Refill  . alfuzosin (UROXATRAL) 10 MG 24 hr tablet Take 10 mg by mouth daily at 12 noon.     [name] aspirin EC 81 MG tablet Take 81 mg by mouth daily.    . cetirizine (ZYRTEC) 10 MG tablet Take 10 mg by mouth daily as needed for allergies.    [name] HYDROcodone-acetaminophen (NORCO) 10-325 MG tablet Take 1-2 tablets by mouth every 4 (four) hours as needed for moderate pain. Maximum dose per 24 hours - 8 pills 18 tablet 0  . naphazoline-pheniramine (NAPHCON-A) 0.025-0.3 % ophthalmic solution Place 1 drop into both eyes 2 (two) times daily.    . phenazopyridine (PYRIDIUM) 200 MG tablet Take 1 tablet (200 mg total) by mouth 3 (three) times daily as needed for pain. 20 tablet 0  . triamcinolone ointment (KENALOG) 0.1 % Apply 1 application topically 2 (two) times daily.     . TRULANCE 3 MG TABS Take 3 mg by mouth every morning.     . Wheat Dextrin (BENEFIBER DRINK MIX PO) Take 5 mLs by mouth 2 (two) times daily.      No current facility-administered medications on file prior to visit.      Past Medical  History:  Diagnosis Date  . Bladder cancer [facility]) urology-- dr Karsten Ro   dx 11/ 2016  Transitional cell carcinoma, High grade invasive , Stage T1  G3 ---  s/p  TURBT and Mitomycin C instillation  . BPH with urinary obstruction   . Dry skin dermatitis   . Elevated PSA   . History of colon polyps   . Hydronephrosis, left     . Lower urinary tract symptoms (LUTS)   . OA (osteoarthritis)   . Prostate nodule   . Seasonal allergic rhinitis   . Wears glasses    Allergies  Allergen Reactions  . Sulfa Antibiotics Swelling  . Amoxicillin Rash    Has patient had a PCN reaction causing immediate rash, facial/tongue/throat swelling, SOB or lightheadedness with hypotension:No Has patient had a PCN reaction causing severe rash involving mucus membranes or skin necrosis:unsure Has patient had a PCN reaction that required hospitalization:No Has patient had a PCN reaction occurring within the last 10 years:unsure If all of the above answers are "NO", then may proceed with Cephalosporin use.   . Ciprofloxacin Swelling and Rash  . Doxycycline Hyclate Rash  . Levofloxacin Rash    Social History   Social History  . Marital status: Married    Spouse name: N/A  . Number of children: N/A  . Years of education: N/A   Social History Main Topics  . Smoking status: Former Smoker    Packs/day: 1.50    Years: 34.00    Types: Cigarettes    Quit date: 02/17/1979  . Smokeless tobacco: Never Used  . Alcohol use Yes     Comment: occasional  . Drug use: No  . Sexual activity: Not Asked   Other Topics Concern  . None   Social History Narrative  . None    Vitals:   06/25/16 1448  BP: 130/68  Pulse: (!) 112  Resp: 16  Temp: (!) 103 F (39.4 C)  O2 sat at RA 91-92% Body mass index is 24.8 kg/m.  Physical Exam  Nursing note and vitals reviewed. Constitutional: He appears well-developed and well-nourished. He does not appear ill. He appears distressed.  HENT:  Head: Atraumatic.  Nose: Right sinus exhibits no maxillary sinus tenderness and no frontal sinus tenderness. Left sinus exhibits no maxillary sinus tenderness and no frontal sinus tenderness.  Mouth/Throat: Oropharynx is clear and moist and mucous membranes are normal.  Eyes: Conjunctivae and EOM are normal.  Neck: Neck supple. No JVD present.   Cardiovascular: Regular rhythm.  Tachycardia present.   No murmur heard. Respiratory: Effort normal and breath sounds normal. No respiratory distress.  GI: Soft. He exhibits no mass. There is no tenderness. There is no CVA tenderness.  Suprapubic pain upon getting up but no tenderness upon palpation.  Musculoskeletal: He exhibits edema (1+ pitting LE edema,bilateral). He exhibits no tenderness.  Lymphadenopathy:    He has no cervical adenopathy.  Neurological: He is alert. He has normal strength.  Oriented in place and person,does not remember date. Stable gait with no assistance. No focal deficit appreciated.  Skin: Skin is warm. No rash noted. No erythema.  Psychiatric: He has a normal mood and affect. His speech is normal.  Well groomed, good eye contact.      ASSESSMENT AND PLAN:   Ruford was seen today for chest pressure, chills, nasal congestion.  Diagnoses and all orders for this visit:  Fever, unspecified fever cause  16 F here in office, he and his wife prefer not to  go to the ER. We discussed possible etiologies,viral illness most likely but given his Hx of bladder carcinoma and age other causes need to be considered. Several abx allergies. He could not collect urine today. Toradol 60 mg IM given after verbal consent. About 15 min after Toradol fever down to 101.0,not longer trembling,and more engage in conversation.He reiterates that he does not want to ER. He will try to bring urine sample earlier tomorrow. We clearly discussed some symptoms or signs that should prompt immediate medical evaluation/911. Further recommendations will be given according to labs results.    -     POC Influenza A&B (Binax test) -     CBC with Differential/Platelet -     Basic metabolic panel -     Culture, Urine -     POCT Urinalysis Dipstick (Automated) -     ketorolac (TORADOL) injection 60 mg; Inject 2 mLs (60 mg total) into the muscle once. -     oseltamivir (TAMIFLU) 75 MG  capsule; Take 1 capsule (75 mg total) by mouth 2 (two) times daily.  URI, acute  I will treat empirically with Tamiflu, risk and benefits discussed with pt and wife,even thought rapid flu test was negative,explained that it is not unusual to see cases of influenza around this time. He and his wife agree with plan. Monitor for new symptoms. Rest,adequate hydration,Tylenol 500 mg qid (max 2600 mg daily). F/U in 4 days,before if needed.  -     oseltamivir (TAMIFLU) 75 MG capsule; Take 1 capsule (75 mg total) by mouth 2 (two) times daily.  Slow transit constipation  Caution with Trulance,it seems like he is having some loose stools,which could aggravate/cause hypoNa++  Memory impairment  Wife reporting some confusion, ?part of a chronic problem vs related to acute illness. I explained to pt and wife that MS changes could be [art of a serious illness, so he needs close monitoring. Instructed about warning signs.    Return in about 4 days (around 06/29/2016) for PCP.    -[name] and wife advised to return or notify a doctor immediately if symptoms worsen or new concerns arise,they both voice understanding.       Ayaana Biondo G. Martinique, MD  [facility]. [place] office.

## 2016-06-25 NOTE — Telephone Encounter (Signed)
Noted  

## 2016-06-25 NOTE — Telephone Encounter (Signed)
Pt coming to see Dr. Martinique.

## 2016-06-26 ENCOUNTER — Telehealth: Payer: Self-pay | Admitting: Internal Medicine

## 2016-06-26 ENCOUNTER — Inpatient Hospital Stay (HOSPITAL_COMMUNITY)
Admission: EM | Admit: 2016-06-26 | Discharge: 2016-06-29 | DRG: 194 | Disposition: A | Discharge status: Home / Self Care | Payer: Medicare Other | Attending: Internal Medicine | Admitting: Internal Medicine

## 2016-06-26 ENCOUNTER — Emergency Department (HOSPITAL_COMMUNITY): Payer: Medicare Other

## 2016-06-26 ENCOUNTER — Encounter (HOSPITAL_COMMUNITY): Payer: Self-pay | Admitting: *Deleted

## 2016-06-26 DIAGNOSIS — Z882 Allergy status to sulfonamides status: Secondary | ICD-10-CM

## 2016-06-26 DIAGNOSIS — D72829 Elevated white blood cell count, unspecified: Secondary | ICD-10-CM | POA: Diagnosis present

## 2016-06-26 DIAGNOSIS — M1990 Unspecified osteoarthritis, unspecified site: Secondary | ICD-10-CM | POA: Diagnosis present

## 2016-06-26 DIAGNOSIS — Z8249 Family history of ischemic heart disease and other diseases of the circulatory system: Secondary | ICD-10-CM

## 2016-06-26 DIAGNOSIS — A419 Sepsis, unspecified organism: Secondary | ICD-10-CM | POA: Diagnosis not present

## 2016-06-26 DIAGNOSIS — Z87891 Personal history of nicotine dependence: Secondary | ICD-10-CM

## 2016-06-26 DIAGNOSIS — Z8551 Personal history of malignant neoplasm of bladder: Secondary | ICD-10-CM

## 2016-06-26 DIAGNOSIS — N390 Urinary tract infection, site not specified: Secondary | ICD-10-CM | POA: Diagnosis present

## 2016-06-26 DIAGNOSIS — J302 Other seasonal allergic rhinitis: Secondary | ICD-10-CM | POA: Diagnosis present

## 2016-06-26 DIAGNOSIS — Z881 Allergy status to other antibiotic agents status: Secondary | ICD-10-CM

## 2016-06-26 DIAGNOSIS — Z800 Family history of malignant neoplasm of digestive organs: Secondary | ICD-10-CM | POA: Diagnosis not present

## 2016-06-26 DIAGNOSIS — J189 Pneumonia, unspecified organism: Secondary | ICD-10-CM | POA: Diagnosis present

## 2016-06-26 DIAGNOSIS — R600 Localized edema: Secondary | ICD-10-CM | POA: Diagnosis present

## 2016-06-26 DIAGNOSIS — I509 Heart failure, unspecified: Secondary | ICD-10-CM | POA: Diagnosis not present

## 2016-06-26 DIAGNOSIS — R609 Edema, unspecified: Secondary | ICD-10-CM | POA: Diagnosis not present

## 2016-06-26 DIAGNOSIS — Z86010 Personal history of colonic polyps: Secondary | ICD-10-CM

## 2016-06-26 DIAGNOSIS — Z7982 Long term (current) use of aspirin: Secondary | ICD-10-CM

## 2016-06-26 DIAGNOSIS — E876 Hypokalemia: Secondary | ICD-10-CM | POA: Diagnosis not present

## 2016-06-26 DIAGNOSIS — Z79899 Other long term (current) drug therapy: Secondary | ICD-10-CM | POA: Diagnosis not present

## 2016-06-26 DIAGNOSIS — E871 Hypo-osmolality and hyponatremia: Secondary | ICD-10-CM | POA: Diagnosis not present

## 2016-06-26 DIAGNOSIS — Z803 Family history of malignant neoplasm of breast: Secondary | ICD-10-CM

## 2016-06-26 DIAGNOSIS — B348 Other viral infections of unspecified site: Secondary | ICD-10-CM | POA: Diagnosis not present

## 2016-06-26 DIAGNOSIS — I471 Supraventricular tachycardia: Secondary | ICD-10-CM | POA: Diagnosis not present

## 2016-06-26 DIAGNOSIS — R05 Cough: Secondary | ICD-10-CM | POA: Diagnosis not present

## 2016-06-26 LAB — CBC WITH DIFFERENTIAL/PLATELET
BASOS PCT: 0.2 % (ref 0.0–3.0)
BASOS PCT: 0 %
Basophils Absolute: 0 10*3/uL (ref 0.0–0.1)
Basophils Absolute: 0 10*3/uL (ref 0.0–0.1)
EOS ABS: 0 10*3/uL (ref 0.0–0.7)
EOS ABS: 0 10*3/uL (ref 0.0–0.7)
Eosinophils Relative: 0 % (ref 0.0–5.0)
Eosinophils Relative: 0 %
HEMATOCRIT: 37.4 % — AB (ref 39.0–52.0)
HEMATOCRIT: 31.7 % — AB (ref 39.0–52.0)
HEMOGLOBIN: 10.8 g/dL — AB (ref 13.0–17.0)
Hemoglobin: 12.4 g/dL — ABNORMAL LOW (ref 13.0–17.0)
LYMPHS ABS: 0.5 10*3/uL — AB (ref 0.7–4.0)
LYMPHS PCT: 2.5 % — AB (ref 12.0–46.0)
Lymphocytes Relative: 3 %
Lymphs Abs: 0.5 10*3/uL — ABNORMAL LOW (ref 0.7–4.0)
MCH: 30.7 pg (ref 26.0–34.0)
MCHC: 33.1 g/dL (ref 30.0–36.0)
MCHC: 34.1 g/dL (ref 30.0–36.0)
MCV: 93.3 fl (ref 78.0–100.0)
MCV: 90.1 fL (ref 78.0–100.0)
Monocytes Absolute: 1 10*3/uL (ref 0.1–1.0)
Monocytes Absolute: 1.1 10*3/uL — ABNORMAL HIGH (ref 0.1–1.0)
Monocytes Relative: 5.7 % (ref 3.0–12.0)
Monocytes Relative: 8 %
NEUTROS ABS: 16.3 10*3/uL — AB (ref 1.4–7.7)
NEUTROS ABS: 13.3 10*3/uL — AB (ref 1.7–7.7)
NEUTROS PCT: 89 %
Neutrophils Relative %: 91.6 % — ABNORMAL HIGH (ref 43.0–77.0)
PLATELETS: 193 10*3/uL (ref 150.0–400.0)
Platelets: 158 10*3/uL (ref 150–400)
RBC: 4.01 Mil/uL — ABNORMAL LOW (ref 4.22–5.81)
RBC: 3.52 MIL/uL — AB (ref 4.22–5.81)
RDW: 13.7 % (ref 11.5–15.5)
RDW: 13.9 % (ref 11.5–15.5)
WBC: 18.2 10*3/uL (ref 4.0–10.5)
WBC: 14.9 10*3/uL — AB (ref 4.0–10.5)

## 2016-06-26 LAB — BASIC METABOLIC PANEL
BUN: 22 mg/dL (ref 6–23)
CO2: 24 mEq/L (ref 19–32)
CREATININE: 0.89 mg/dL (ref 0.40–1.50)
Calcium: 9.1 mg/dL (ref 8.4–10.5)
Chloride: 101 mEq/L (ref 96–112)
GFR: 86.38 mL/min (ref >60.00)
Glucose, Bld: 112 mg/dL — ABNORMAL HIGH (ref 70–99)
POTASSIUM: 4.1 meq/L (ref 3.5–5.1)
Sodium: 134 mEq/L — ABNORMAL LOW (ref 135–145)

## 2016-06-26 LAB — URINALYSIS, ROUTINE W REFLEX MICROSCOPIC
BILIRUBIN URINE: NEGATIVE
Glucose, UA: NEGATIVE mg/dL
KETONES UR: NEGATIVE mg/dL
NITRITE: NEGATIVE
PROTEIN: 30 mg/dL — AB
Specific Gravity, Urine: 1.011 (ref 1.005–1.030)
Squamous Epithelial / LPF: NONE SEEN
pH: 6 (ref 5.0–8.0)

## 2016-06-26 LAB — COMPREHENSIVE METABOLIC PANEL
ALT: 17 U/L (ref 17–63)
AST: 22 U/L (ref 15–41)
Albumin: 3.3 g/dL — ABNORMAL LOW (ref 3.5–5.0)
Alkaline Phosphatase: 61 U/L (ref 38–126)
Anion gap: 7 (ref 5–15)
BUN: 29 mg/dL — ABNORMAL HIGH (ref 6–20)
CALCIUM: 8.5 mg/dL — AB (ref 8.9–10.3)
CO2: 22 mmol/L (ref 22–32)
Chloride: 103 mmol/L (ref 101–111)
Creatinine, Ser: 0.92 mg/dL (ref 0.61–1.24)
Glucose, Bld: 120 mg/dL — ABNORMAL HIGH (ref 65–99)
Potassium: 4 mmol/L (ref 3.5–5.1)
SODIUM: 132 mmol/L — AB (ref 135–145)
Total Bilirubin: 1.5 mg/dL — ABNORMAL HIGH (ref 0.3–1.2)
Total Protein: 5.9 g/dL — ABNORMAL LOW (ref 6.5–8.1)

## 2016-06-26 LAB — POC URINALSYSI DIPSTICK (AUTOMATED)
Bilirubin, UA: NEGATIVE
Glucose, UA: NEGATIVE
KETONES UA: NEGATIVE
Nitrite, UA: NEGATIVE
SPEC GRAV UA: 1.01 (ref 1.030–1.035)
Urobilinogen, UA: 0.2 (ref ?–2.0)
pH, UA: 6 (ref 5.0–8.0)

## 2016-06-26 LAB — INFLUENZA PANEL BY PCR (TYPE A & B)
INFLBPCR: NEGATIVE
Influenza A By PCR: NEGATIVE

## 2016-06-26 LAB — I-STAT CG4 LACTIC ACID, ED: LACTIC ACID, VENOUS: 0.77 mmol/L (ref 0.5–1.9)

## 2016-06-26 MED ORDER — DEXTROSE 5 % IV SOLN
500.0000 mg | INTRAVENOUS | Status: DC
  Administered 2016-06-27 – 2016-06-28 (×2): 500 mg via INTRAVENOUS
  Filled 2016-06-26 (×3): qty 500

## 2016-06-26 MED ORDER — NAPHAZOLINE-PHENIRAMINE 0.025-0.3 % OP SOLN
1.0000 [drp] | Freq: Two times a day (BID) | OPHTHALMIC | Status: DC
  Administered 2016-06-26 – 2016-06-29 (×6): 1 [drp] via OPHTHALMIC
  Filled 2016-06-26: qty 5

## 2016-06-26 MED ORDER — PREMIER PROTEIN SHAKE
2.0000 [oz_av] | Freq: Every day | ORAL | Status: DC
  Administered 2016-06-27 – 2016-06-29 (×3): 2 [oz_av] via ORAL
  Filled 2016-06-26 (×3): qty 325.31

## 2016-06-26 MED ORDER — PHENAZOPYRIDINE HCL 200 MG PO TABS
200.0000 mg | ORAL_TABLET | Freq: Three times a day (TID) | ORAL | Status: DC
  Administered 2016-06-27 – 2016-06-29 (×8): 200 mg via ORAL
  Filled 2016-06-26 (×9): qty 1

## 2016-06-26 MED ORDER — ALFUZOSIN HCL ER 10 MG PO TB24
10.0000 mg | ORAL_TABLET | Freq: Every day | ORAL | Status: DC
  Administered 2016-06-27 – 2016-06-29 (×3): 10 mg via ORAL
  Filled 2016-06-26 (×3): qty 1

## 2016-06-26 MED ORDER — DEXTROSE 5 % IV SOLN
1.0000 g | Freq: Once | INTRAVENOUS | Status: AC
  Administered 2016-06-26: 1 g via INTRAVENOUS
  Filled 2016-06-26: qty 10

## 2016-06-26 MED ORDER — DEXTROSE 5 % IV SOLN
1.0000 g | INTRAVENOUS | Status: DC
  Administered 2016-06-27 – 2016-06-28 (×2): 1 g via INTRAVENOUS
  Filled 2016-06-26 (×3): qty 10

## 2016-06-26 MED ORDER — ASPIRIN EC 81 MG PO TBEC
81.0000 mg | DELAYED_RELEASE_TABLET | Freq: Every day | ORAL | Status: DC
  Administered 2016-06-26 – 2016-06-28 (×3): 81 mg via ORAL
  Filled 2016-06-26 (×3): qty 1

## 2016-06-26 MED ORDER — ENOXAPARIN SODIUM 40 MG/0.4ML ~~LOC~~ SOLN
40.0000 mg | SUBCUTANEOUS | Status: DC
Start: 2016-06-26 — End: 2016-06-29
  Administered 2016-06-26 – 2016-06-28 (×3): 40 mg via SUBCUTANEOUS
  Filled 2016-06-26 (×3): qty 0.4

## 2016-06-26 MED ORDER — DEXTROSE 5 % IV SOLN
500.0000 mg | Freq: Once | INTRAVENOUS | Status: AC
  Administered 2016-06-26: 500 mg via INTRAVENOUS
  Filled 2016-06-26: qty 500

## 2016-06-26 MED ORDER — OSELTAMIVIR PHOSPHATE 30 MG PO CAPS
30.0000 mg | ORAL_CAPSULE | Freq: Two times a day (BID) | ORAL | Status: DC
  Administered 2016-06-26 – 2016-06-27 (×2): 30 mg via ORAL
  Filled 2016-06-26 (×2): qty 1

## 2016-06-26 MED ORDER — SODIUM CHLORIDE 0.9 % IV SOLN
INTRAVENOUS | Status: DC
  Administered 2016-06-26 – 2016-06-27 (×3): via INTRAVENOUS

## 2016-06-26 MED ORDER — DM-GUAIFENESIN ER 30-600 MG PO TB12
1.0000 | ORAL_TABLET | Freq: Two times a day (BID) | ORAL | Status: DC
  Administered 2016-06-26 – 2016-06-27 (×2): 1 via ORAL
  Filled 2016-06-26 (×2): qty 1

## 2016-06-26 MED ORDER — SODIUM CHLORIDE 0.9 % IV BOLUS (SEPSIS)
500.0000 mL | Freq: Once | INTRAVENOUS | Status: AC
  Administered 2016-06-26: 500 mL via INTRAVENOUS

## 2016-06-26 NOTE — H&P (Signed)
History and Physical    Benjamin Cardenas WEX:[id number] DOB: 09-Jan-1930 DOA: 06/26/2016  PCP: Nyoka Cowden, MD  Patient coming from: Home  I have personally briefly reviewed patient's old medical records in [place]  Chief Complaint: Fever  HPI: Benjamin Cardenas is a 82 y.o. male with medical history significant of bladder cancer currently in remission.  Patient presents to the ED with c/o fever, cough, nasal congestion, chills.  Symptoms onset about a week ago, worsening and developed chest congestion and cough and fever.  Saw PCP yesterday, flu PCR negative, started on tamiflu anyhow.  Mucinex DM provides some relief.  Patient sent in to ED today after WBC yesterday came back at 18k.  ED Course: Tm 102.7, P 120 comes down to 107 after IVF, CXR shows R perihilar PNA.  WBC 14k   Review of Systems: As per HPI otherwise 10 point review of systems negative.   Past Medical History:  Diagnosis Date  . Bladder cancer [facility]) urology-- dr Karsten Ro   dx 11/ 2016  Transitional cell carcinoma, High grade invasive , Stage T1  G3 ---  s/p  TURBT and Mitomycin C instillation  . BPH with urinary obstruction   . Dry skin dermatitis   . Elevated PSA   . History of colon polyps   . Hydronephrosis, left   . Lower urinary tract symptoms (LUTS)   . OA (osteoarthritis)   . Prostate nodule   . Seasonal allergic rhinitis   . Wears glasses     Past Surgical History:  Procedure Laterality Date  . CYSTOSCOPY WITH BIOPSY N/A 04/22/2015   Procedure: CYSTOSCOPY WITH BLADDER BIOPSY;  Surgeon: Kathie Rhodes, MD;  Location: [facility];  Service: Urology;  Laterality: N/A;  . CYSTOSCOPY WITH RETROGRADE PYELOGRAM, URETEROSCOPY AND STENT PLACEMENT Left 02/25/2015   Procedure: LEFT RETROGRADE PYELOGRAM, URETEROSCOPY ;  Surgeon: Kathie Rhodes, MD;  Location: [facility];  Service: Urology;  Laterality: Left;  . HERNIA REPAIR  age 29 1937   rupture right inguinal hernia    . INGUINAL HERNIA REPAIR  03/02/2011   Procedure: HERNIA REPAIR INGUINAL ADULT;  Surgeon: Earnstine Regal, MD;  Location: [facility];  Service: General;  Laterality: Left;  with mesh   . TRANSURETHRAL RESECTION OF BLADDER TUMOR WITH GYRUS (TURBT-GYRUS) N/A 02/25/2015   Procedure: TRANSURETHRAL RESECTION OF BLADDER TUMOR WITH GYRUS (TURBT-GYRUS);  Surgeon: Kathie Rhodes, MD;  Location: [facility];  Service: Urology;  Laterality: N/A;     reports that he quit smoking about 37 years ago. His smoking use included Cigarettes. He has a 51.00 pack-year smoking history. He has never used smokeless tobacco. He reports that he drinks alcohol. He reports that he does not use drugs.  Allergies  Allergen Reactions  . Amoxicillin Rash    Has patient had a PCN reaction causing immediate rash, facial/tongue/throat swelling, SOB or lightheadedness with hypotension:No Has patient had a PCN reaction causing severe rash involving mucus membranes or skin necrosis:unsure Has patient had a PCN reaction that required hospitalization:No Has patient had a PCN reaction occurring within the last 10 years:unsure If all of the above answers are "NO", then may proceed with Cephalosporin use.   . Ciprofloxacin Swelling and Rash  . Sulfasalazine Swelling  . Sulfa Antibiotics Swelling  . Doxycycline Hyclate Rash  . Levofloxacin Rash    Family History  Problem Relation Age of Onset  . Heart disease Mother   . Heart disease Father   . Cancer Sister  breast  . Cancer Brother     colon     Prior to Admission medications   Medication Sig Start Date End Date Taking? Authorizing Provider  alfuzosin (UROXATRAL) 10 MG 24 hr tablet Take 10 mg by mouth daily at 12 noon.  11/11/15  Yes Historical Provider, MD  ALOE VERA JUICE LIQD Take 30 mLs by mouth every morning.   Yes Historical Provider, MD  aspirin EC 81 MG tablet Take 81 mg by mouth at bedtime.    Yes Historical Provider, MD  Black Pepper-Turmeric  (TURMERIC COMPLEX/BLACK PEPPER PO) Take 1 capsule by mouth daily.   Yes Historical Provider, MD  cetirizine (ZYRTEC) 10 MG tablet Take 10 mg by mouth daily as needed for allergies.   Yes Historical Provider, MD  dextromethorphan-guaiFENesin (MUCINEX DM) 30-600 MG 12hr tablet Take 1 tablet by mouth 2 (two) times daily.   Yes Historical Provider, MD  naphazoline-pheniramine (NAPHCON-A) 0.025-0.3 % ophthalmic solution Place 1 drop into both eyes 2 (two) times daily.   Yes Historical Provider, MD  oseltamivir (TAMIFLU) 75 MG capsule Take 1 capsule (75 mg total) by mouth 2 (two) times daily. 06/25/16 06/30/16 Yes Betty G Martinique, MD  phenazopyridine (PYRIDIUM) 200 MG tablet Take 1 tablet (200 mg total) by mouth 3 (three) times daily as needed for pain. 04/22/15  Yes Kathie Rhodes, MD  protein supplement shake (PREMIER PROTEIN) LIQD Take 2 oz by mouth daily. Add to PET butter pecan ice cream for calorie/protein supplement.   Yes Historical Provider, MD  triamcinolone ointment (KENALOG) 0.1 % Apply 1 application topically 2 (two) times daily.  12/25/15  Yes Historical Provider, MD  TRULANCE 3 MG TABS Take 3 mg by mouth every morning.  10/29/15  Yes Historical Provider, MD  Wheat Dextrin (BENEFIBER DRINK MIX PO) Take 5 mLs by mouth 2 (two) times daily.    Yes Historical Provider, MD    Physical Exam: Vitals:   06/26/16 1627 06/26/16 1814 06/26/16 1830 06/26/16 1900  BP: 112/81 117/69 122/69 118/70  Pulse: (!) 111 92 96 98  Resp: 20     Temp: (!) 102.7 F (39.3 C)     TempSrc: Oral     SpO2: 95% 98% 100% 100%  Weight: 69.7 kg (153 lb 9.6 oz)       Constitutional: NAD, calm, comfortable Eyes: PERRL, lids and conjunctivae normal ENMT: Mucous membranes are moist. Posterior pharynx clear of any exudate or lesions.Normal dentition.  Neck: normal, supple, no masses, no thyromegaly Respiratory: clear to auscultation bilaterally, no wheezing, no crackles. Normal respiratory effort. No accessory muscle use.    Cardiovascular: Regular rate and rhythm, no murmurs / rubs / gallops. No extremity edema. 2+ pedal pulses. No carotid bruits.  Abdomen: no tenderness, no masses palpated. No hepatosplenomegaly. Bowel sounds positive.  Musculoskeletal: no clubbing / cyanosis. No joint deformity upper and lower extremities. Good ROM, no contractures. Normal muscle tone.  Skin: no rashes, lesions, ulcers. No induration Neurologic: CN 2-12 grossly intact. Sensation intact, DTR normal. Strength 5/5 in all 4.  Psychiatric: Normal judgment and insight. Alert and oriented x 3. Normal mood.    Labs on Admission: I have personally reviewed following labs and imaging studies  CBC:  Recent Labs Lab 06/25/16 1553 06/26/16 1714  WBC 18.2 Repeated and verified X2.* 14.9*  NEUTROABS 16.3* 13.3*  HGB 12.4* 10.8*  HCT 37.4* 31.7*  MCV 93.3 90.1  PLT 193.0 119   Basic Metabolic Panel:  Recent Labs Lab 06/25/16 1553 06/26/16 1714  NA 134* 132*  K 4.1 4.0  CL 101 103  CO2 24 22  GLUCOSE 112* 120*  BUN 22 29*  CREATININE 0.89 0.92  CALCIUM 9.1 8.5*   GFR: Estimated Creatinine Clearance: 53 mL/min (by C-G formula based on SCr of 0.92 mg/dL). Liver Function Tests:  Recent Labs Lab 06/26/16 1714  AST 22  ALT 17  ALKPHOS 61  BILITOT 1.5*  PROT 5.9*  ALBUMIN 3.3*   No results for input(s): LIPASE, AMYLASE in the last 168 hours. No results for input(s): AMMONIA in the last 168 hours. Coagulation Profile: No results for input(s): INR, PROTIME in the last 168 hours. Cardiac Enzymes: No results for input(s): CKTOTAL, CKMB, CKMBINDEX, TROPONINI in the last 168 hours. BNP (last 3 results) No results for input(s): PROBNP in the last 8760 hours. HbA1C: No results for input(s): HGBA1C in the last 72 hours. CBG: No results for input(s): GLUCAP in the last 168 hours. Lipid Profile: No results for input(s): CHOL, HDL, LDLCALC, TRIG, CHOLHDL, LDLDIRECT in the last 72 hours. Thyroid Function Tests: No  results for input(s): TSH, T4TOTAL, FREET4, T3FREE, THYROIDAB in the last 72 hours. Anemia Panel: No results for input(s): VITAMINB12, FOLATE, FERRITIN, TIBC, IRON, RETICCTPCT in the last 72 hours. Urine analysis:    Component Value Date/Time   COLORURINE YELLOW 06/26/2016 [place] 06/26/2016 1653   LABSPEC 1.011 06/26/2016 1653   PHURINE 6.0 06/26/2016 1653   GLUCOSEU NEGATIVE 06/26/2016 1653   HGBUR MODERATE (A) 06/26/2016 1653   BILIRUBINUR NEGATIVE 06/26/2016 1653   BILIRUBINUR n 06/26/2016 0810   KETONESUR NEGATIVE 06/26/2016 1653   PROTEINUR 30 (A) 06/26/2016 1653   UROBILINOGEN 0.2 06/26/2016 0810   UROBILINOGEN 0.2 07/11/2012 0200   NITRITE NEGATIVE 06/26/2016 1653   LEUKOCYTESUR LARGE (A) 06/26/2016 1653    Radiological Exams on Admission: Dg Chest 2 View  Result Date: 06/26/2016 CLINICAL DATA:  Fever and leukocytosis. Cough and congestion. History of bladder cancer. EXAM: CHEST  2 VIEW COMPARISON:  02/17/2011. FINDINGS: The lung bases are partially excluded from the initial frontal examination which was repeated. On the repeat view, there are low lung volumes. There is asymmetric right perihilar airspace disease which is best seen on the frontal examination. Patchy left basilar opacity on the second frontal examination is attributed to atelectasis. No other airspace opacities, edema or significant pleural effusion identified. The heart size and mediastinal contours are stable. IMPRESSION: Right perihilar airspace disease suspicious for early pneumonia. Followup PA and lateral chest X-ray is recommended in 3-4 weeks following trial of antibiotic therapy to ensure resolution and exclude underlying malignancy. Electronically Signed   By: Richardean Sale M.D.   On: 06/26/2016 17:40    EKG: Independently reviewed.  Assessment/Plan Principal Problem:   CAP (community acquired pneumonia)    1. CAP - 1. PNA pathway 2. Cultures pending 3. Rocephin /  azithromycin 4. Influenza PCR pending 1. Continue tamiflu for the moment pending results 5. Mucinex DM  DVT prophylaxis: Lovenox Code Status: Full Family Communication: Wife at bedside Disposition Plan: Home after admit Consults called: None Admission status: Admit to inpatient, Patients PORT score is elevated   GARDNER, [place] [facility] Pager [phone]  If 7AM-7PM, please contact day team taking care of patient [email or web address] Password [facility] [address]  06/26/2016, 8:08 PM

## 2016-06-26 NOTE — ED Notes (Signed)
Second set of blood cultures obtained at [facility]

## 2016-06-26 NOTE — ED Notes (Signed)
Hospitalist at bedside 

## 2016-06-26 NOTE — ED Triage Notes (Signed)
Pt sent by PCP due to WBC of 18 and fever. Pt is taking tamiflu and mucinex for cough and congestion. Pt has hx of bladder cancer, states he has had dysuria due to inflammation in his bladder.

## 2016-06-26 NOTE — Telephone Encounter (Signed)
Pt calling to check on lab results from 06/25/16 so that he will not have to go the hospital.

## 2016-06-26 NOTE — ED Provider Notes (Signed)
[facility] DEPT Provider Note   CSN: [id number] Arrival date & time: 06/26/16  1621     History   Chief Complaint Chief Complaint  Patient presents with  . Abnormal Lab  . Fever    HPI Benjamin Cardenas is a 83 y.o. male.  HPI Patient presents with fever. Seen yesterday at primary care doctor. Has had some nasal congestion. Reported negative flu test yesterday but started empirically on Tamiflu. Has had some dysuria but has a chronic bladder inflammation due to previous cancer. White count of 18 yesterday. Told by primary care doctor to come in the hospital for possible IV antibiotics. Has had mild cough with occasional sputum production. No nausea vomiting or diarrhea. Has had some confusion yesterday but doing somewhat better today.   Past Medical History:  Diagnosis Date  . Bladder cancer [facility]) urology-- dr Karsten Ro   dx 11/ 2016  Transitional cell carcinoma, High grade invasive , Stage T1  G3 ---  s/p  TURBT and Mitomycin C instillation  . BPH with urinary obstruction   . Dry skin dermatitis   . Elevated PSA   . History of colon polyps   . Hydronephrosis, left   . Lower urinary tract symptoms (LUTS)   . OA (osteoarthritis)   . Prostate nodule   . Seasonal allergic rhinitis   . Wears glasses     Patient Active Problem List   Diagnosis Date Noted  . Urinary tract infection 12/14/2015  . Hyponatremia 12/13/2015  . Anemia 12/13/2015  . Altered mental status 12/13/2015  . Constipation 08/17/2015  . Memory impairment 12/04/2014  . Inguinal hernia unilateral, non-recurrent, left 01/28/2011  . OTHER SYMPTOMS INVOLVING CARDIOVASCULAR SYSTEM 09/20/2009  . Osteoarthritis 01/31/2009  . NECK PAIN 02/15/2007  . BPH (benign prostatic hyperplasia) 01/06/2007  . History of colonic polyps 01/06/2007    Past Surgical History:  Procedure Laterality Date  . CYSTOSCOPY WITH BIOPSY N/A 04/22/2015   Procedure: CYSTOSCOPY WITH BLADDER BIOPSY;  Surgeon: Kathie Rhodes, MD;   Location: [facility];  Service: Urology;  Laterality: N/A;  . CYSTOSCOPY WITH RETROGRADE PYELOGRAM, URETEROSCOPY AND STENT PLACEMENT Left 02/25/2015   Procedure: LEFT RETROGRADE PYELOGRAM, URETEROSCOPY ;  Surgeon: Kathie Rhodes, MD;  Location: [facility];  Service: Urology;  Laterality: Left;  . HERNIA REPAIR  age 28 1937   rupture right inguinal hernia  . INGUINAL HERNIA REPAIR  03/02/2011   Procedure: HERNIA REPAIR INGUINAL ADULT;  Surgeon: Earnstine Regal, MD;  Location: [facility];  Service: General;  Laterality: Left;  with mesh   . TRANSURETHRAL RESECTION OF BLADDER TUMOR WITH GYRUS (TURBT-GYRUS) N/A 02/25/2015   Procedure: TRANSURETHRAL RESECTION OF BLADDER TUMOR WITH GYRUS (TURBT-GYRUS);  Surgeon: Kathie Rhodes, MD;  Location: [facility];  Service: Urology;  Laterality: N/A;       Home Medications    Prior to Admission medications   Medication Sig Start Date End Date Taking? Authorizing Provider  alfuzosin (UROXATRAL) 10 MG 24 hr tablet Take 10 mg by mouth daily at 12 noon.  11/11/15  Yes Historical Provider, MD  ALOE VERA JUICE LIQD Take 30 mLs by mouth every morning.   Yes Historical Provider, MD  aspirin EC 81 MG tablet Take 81 mg by mouth at bedtime.    Yes Historical Provider, MD  Black Pepper-Turmeric (TURMERIC COMPLEX/BLACK PEPPER PO) Take 1 capsule by mouth daily.   Yes Historical Provider, MD  cetirizine (ZYRTEC) 10 MG tablet Take 10 mg by mouth daily as needed  for allergies.   Yes Historical Provider, MD  dextromethorphan-guaiFENesin (MUCINEX DM) 30-600 MG 12hr tablet Take 1 tablet by mouth 2 (two) times daily.   Yes Historical Provider, MD  naphazoline-pheniramine (NAPHCON-A) 0.025-0.3 % ophthalmic solution Place 1 drop into both eyes 2 (two) times daily.   Yes Historical Provider, MD  oseltamivir (TAMIFLU) 75 MG capsule Take 1 capsule (75 mg total) by mouth 2 (two) times daily. 06/25/16 06/30/16 Yes Betty G Martinique, MD  phenazopyridine  (PYRIDIUM) 200 MG tablet Take 1 tablet (200 mg total) by mouth 3 (three) times daily as needed for pain. 04/22/15  Yes Kathie Rhodes, MD  protein supplement shake (PREMIER PROTEIN) LIQD Take 2 oz by mouth daily. Add to PET butter pecan ice cream for calorie/protein supplement.   Yes Historical Provider, MD  triamcinolone ointment (KENALOG) 0.1 % Apply 1 application topically 2 (two) times daily.  12/25/15  Yes Historical Provider, MD  TRULANCE 3 MG TABS Take 3 mg by mouth every morning.  10/29/15  Yes Historical Provider, MD  Wheat Dextrin (BENEFIBER DRINK MIX PO) Take 5 mLs by mouth 2 (two) times daily.    Yes Historical Provider, MD    Family History Family History  Problem Relation Age of Onset  . Heart disease Mother   . Heart disease Father   . Cancer Sister     breast  . Cancer Brother     colon    Social History Social History  Substance Use Topics  . Smoking status: Former Smoker    Packs/day: 1.50    Years: 34.00    Types: Cigarettes    Quit date: 02/17/1979  . Smokeless tobacco: Never Used  . Alcohol use Yes     Comment: occasional     Allergies   Amoxicillin; Ciprofloxacin; Sulfasalazine; Sulfa antibiotics; Doxycycline hyclate; and Levofloxacin   Review of Systems Review of Systems  Constitutional: Positive for appetite change and fatigue.  HENT: Negative for sore throat.   Eyes: Negative for redness.  Respiratory: Positive for cough.   Cardiovascular: Negative for chest pain.  Gastrointestinal: Negative for abdominal pain.  Endocrine: Negative for polyuria.  Genitourinary: Positive for dysuria.  Musculoskeletal: Negative for back pain.  Neurological: Negative for numbness.  Hematological: Negative for adenopathy.  Psychiatric/Behavioral: Positive for confusion.     Physical Exam Updated Vital Signs BP 118/70   Pulse 98   Temp (!) 102.7 F (39.3 C) (Oral)   Resp 20   Wt 153 lb 9.6 oz (69.7 kg)   SpO2 100%   BMI 25.17 kg/m   Physical Exam    Constitutional: He appears well-developed.  HENT:  Head: Atraumatic.  Mouth/Throat: No oropharyngeal exudate.  Eyes: Pupils are equal, round, and reactive to light.  Cardiovascular:  Mild tachycardia  Pulmonary/Chest: Effort normal.  Abdominal: There is tenderness.  Mild suprapubic tenderness without rebound or guarding.  Musculoskeletal: He exhibits no edema.  Neurological: He is alert.  Skin: Skin is warm. Capillary refill takes less than 2 seconds.  Psychiatric: He has a normal mood and affect.     ED Treatments / Results  Labs (all labs ordered are listed, but only abnormal results are displayed) Labs Reviewed  COMPREHENSIVE METABOLIC PANEL - Abnormal; Notable for the following:       Result Value   Sodium 132 (*)    Glucose, Bld 120 (*)    BUN 29 (*)    Calcium 8.5 (*)    Total Protein 5.9 (*)    Albumin 3.3 (*)  Total Bilirubin 1.5 (*)    All other components within normal limits  CBC WITH DIFFERENTIAL/PLATELET - Abnormal; Notable for the following:    WBC 14.9 (*)    RBC 3.52 (*)    Hemoglobin 10.8 (*)    HCT 31.7 (*)    Neutro Abs 13.3 (*)    Lymphs Abs 0.5 (*)    Monocytes Absolute 1.1 (*)    All other components within normal limits  URINALYSIS, ROUTINE W REFLEX MICROSCOPIC - Abnormal; Notable for the following:    Hgb urine dipstick MODERATE (*)    Protein, ur 30 (*)    Leukocytes, UA LARGE (*)    Bacteria, UA FEW (*)    All other components within normal limits  URINE CULTURE  CULTURE, BLOOD (ROUTINE X 2)  CULTURE, BLOOD (ROUTINE X 2)  I-STAT CG4 LACTIC ACID, ED    EKG  EKG Interpretation None       Radiology Dg Chest 2 View  Result Date: 06/26/2016 CLINICAL DATA:  Fever and leukocytosis. Cough and congestion. History of bladder cancer. EXAM: CHEST  2 VIEW COMPARISON:  02/17/2011. FINDINGS: The lung bases are partially excluded from the initial frontal examination which was repeated. On the repeat view, there are low lung volumes. There is  asymmetric right perihilar airspace disease which is best seen on the frontal examination. Patchy left basilar opacity on the second frontal examination is attributed to atelectasis. No other airspace opacities, edema or significant pleural effusion identified. The heart size and mediastinal contours are stable. IMPRESSION: Right perihilar airspace disease suspicious for early pneumonia. Followup PA and lateral chest X-ray is recommended in 3-4 weeks following trial of antibiotic therapy to ensure resolution and exclude underlying malignancy. Electronically Signed   By: Richardean Sale M.D.   On: 06/26/2016 17:40    Procedures Procedures (including critical care time)  Medications Ordered in ED Medications  cefTRIAXone (ROCEPHIN) 1 g in dextrose 5 % 50 mL IVPB (1 g Intravenous New Bag/Given 06/26/16 1913)  azithromycin (ZITHROMAX) 500 mg in dextrose 5 % 250 mL IVPB (not administered)  sodium chloride 0.9 % bolus 500 mL (0 mLs Intravenous Stopped 06/26/16 1913)     Initial Impression / Assessment and Plan / ED Course  I have reviewed the triage vital signs and the nursing notes.  Pertinent labs & imaging results that were available during my care of the patient were reviewed by me and considered in my medical decision making (see chart for details).     Patient presents with fever and generalized weakness. Started empirically yesterday on Tamiflu. Has had a cough. X-ray shows right lower lobe pneumonia. Urine is somewhat abnormal but has chronic abnormal urines due to scarring and irritability in his latter. Has had some more pain but I do not know this is a UTI especially with another source for the fever. Urine cultures been sent. Started on Rocephin and azithromycin. Will admit to internal medicine. Heart rate has decreased back down to normal. Normal lactic acid.  Final Clinical Impressions(s) / ED Diagnoses   Final diagnoses:  Community acquired pneumonia, unspecified laterality     New Prescriptions New Prescriptions   No medications on file     Davonna Belling, MD 06/26/16 1918

## 2016-06-26 NOTE — Telephone Encounter (Signed)
Informed patient of results and patient verbalized understanding. WBC is at 18.2, patient's wife advised that patient needs to be taken to the ER. She verbalized understanding and will take him to [facility].

## 2016-06-27 DIAGNOSIS — J189 Pneumonia, unspecified organism: Principal | ICD-10-CM

## 2016-06-27 DIAGNOSIS — A419 Sepsis, unspecified organism: Secondary | ICD-10-CM

## 2016-06-27 DIAGNOSIS — N390 Urinary tract infection, site not specified: Secondary | ICD-10-CM

## 2016-06-27 LAB — URINE CULTURE: Culture: NO GROWTH

## 2016-06-27 LAB — RESPIRATORY PANEL BY PCR
ADENOVIRUS-RVPPCR: NOT DETECTED
Bordetella pertussis: NOT DETECTED
CHLAMYDOPHILA PNEUMONIAE-RVPPCR: NOT DETECTED
CORONAVIRUS HKU1-RVPPCR: NOT DETECTED
CORONAVIRUS NL63-RVPPCR: NOT DETECTED
CORONAVIRUS OC43-RVPPCR: NOT DETECTED
Coronavirus 229E: NOT DETECTED
INFLUENZA A-RVPPCR: NOT DETECTED
Influenza B: NOT DETECTED
METAPNEUMOVIRUS-RVPPCR: NOT DETECTED
Mycoplasma pneumoniae: NOT DETECTED
PARAINFLUENZA VIRUS 1-RVPPCR: NOT DETECTED
PARAINFLUENZA VIRUS 2-RVPPCR: NOT DETECTED
PARAINFLUENZA VIRUS 3-RVPPCR: NOT DETECTED
PARAINFLUENZA VIRUS 4-RVPPCR: NOT DETECTED
RHINOVIRUS / ENTEROVIRUS - RVPPCR: DETECTED — AB
Respiratory Syncytial Virus: NOT DETECTED

## 2016-06-27 LAB — EXPECTORATED SPUTUM ASSESSMENT W GRAM STAIN, RFLX TO RESP C

## 2016-06-27 LAB — HIV ANTIBODY (ROUTINE TESTING W REFLEX): HIV SCREEN 4TH GENERATION: NONREACTIVE

## 2016-06-27 LAB — EXPECTORATED SPUTUM ASSESSMENT W REFEX TO RESP CULTURE

## 2016-06-27 LAB — STREP PNEUMONIAE URINARY ANTIGEN: STREP PNEUMO URINARY ANTIGEN: NEGATIVE

## 2016-06-27 LAB — MRSA PCR SCREENING: MRSA by PCR: NEGATIVE

## 2016-06-27 MED ORDER — ACETAMINOPHEN 325 MG PO TABS
650.0000 mg | ORAL_TABLET | Freq: Four times a day (QID) | ORAL | Status: DC | PRN
  Administered 2016-06-28: 650 mg via ORAL
  Filled 2016-06-27 (×2): qty 2

## 2016-06-27 MED ORDER — GUAIFENESIN ER 600 MG PO TB12
600.0000 mg | ORAL_TABLET | Freq: Two times a day (BID) | ORAL | Status: DC
  Administered 2016-06-27 – 2016-06-29 (×4): 600 mg via ORAL
  Filled 2016-06-27 (×4): qty 1

## 2016-06-27 NOTE — Progress Notes (Signed)
PROGRESS NOTE  Benjamin Cardenas JKD:[id number] DOB: 1930-01-06 DOA: 06/26/2016 PCP: Nyoka Cowden, MD  HPI/Recap of past 24 hours: Fever subsided, some intermittent dry cough, on room air,denies chest pain,  wife at bedside  Assessment/Plan: Principal Problem:   CAP (community acquired pneumonia)  CAP/sepsis on presentation, he presented with fever 102.7, sinsus tachycardia, heart rate 111, leukocytosis18,2, lactic acid 0.77 on admission He was seen by pmd due to uri symptom on 3/22 was started on tamiflu cxr on presentation :"Right perihilar airspace disease suspicious for early pneumonia" Flu swab negative, respiratory viral penal pending, mrsa screening pending ( pat mrsa colonizer per care everywhere), blood culture in process, sputum culture in process, urine strep pneumo antigen negative, hiv screening negative Continue rocephin/zithro for now   UTI?: patient has h/o bladder cancer s/p resection, s/p BCG treatment, he has been having LUTS on and off for at lease a few months ua on presentation with few bacteria, large leuk, wbc TNTC, negative nitrite Urine culture pending, he is already on abx  Hyponatremia (mild): on gentle hydration  h/o bilateral lower extremity edema, today on exam no significant edema, patient report today his legs dose look better, will get venous doppler and echo    Code Status: full  Family Communication: patient   Disposition Plan: home in 1-2    Consultants:  none  Procedures:  none  Antibiotics:  Rocephin/zithro   Objective: BP 115/60 (BP Location: Right Arm)   Pulse 69   Temp 98.4 F (36.9 C) (Oral)   Resp 16   Ht 5\' 6"  (1.676 m)   Wt 68.2 kg (150 lb 5.7 oz)   SpO2 95%   BMI 24.27 kg/m   Intake/Output Summary (Last 24 hours) at 06/27/16 1228 Last data filed at 06/27/16 1000  Gross per 24 hour  Intake          1948.33 ml  Output                0 ml  Net          1948.33 ml   Filed Weights   06/26/16 1627  06/26/16 2214  Weight: 69.7 kg (153 lb 9.6 oz) 68.2 kg (150 lb 5.7 oz)    Exam:   General:  NAD  Cardiovascular: RRR  Respiratory: few crackles, no wheezing, no rhonchi  Abdomen: Soft/ND/NT, positive BS  Musculoskeletal: No Edema  Neuro: aaox3,  intentional  tremor ( report chronic)  Data Reviewed: Basic Metabolic Panel:  Recent Labs Lab 06/25/16 1553 06/26/16 1714  NA 134* 132*  K 4.1 4.0  CL 101 103  CO2 24 22  GLUCOSE 112* 120*  BUN 22 29*  CREATININE 0.89 0.92  CALCIUM 9.1 8.5*   Liver Function Tests:  Recent Labs Lab 06/26/16 1714  AST 22  ALT 17  ALKPHOS 61  BILITOT 1.5*  PROT 5.9*  ALBUMIN 3.3*   No results for input(s): LIPASE, AMYLASE in the last 168 hours. No results for input(s): AMMONIA in the last 168 hours. CBC:  Recent Labs Lab 06/25/16 1553 06/26/16 1714  WBC 18.2 Repeated and verified X2.* 14.9*  NEUTROABS 16.3* 13.3*  HGB 12.4* 10.8*  HCT 37.4* 31.7*  MCV 93.3 90.1  PLT 193.0 158   Cardiac Enzymes:   No results for input(s): CKTOTAL, CKMB, CKMBINDEX, TROPONINI in the last 168 hours. BNP (last 3 results) No results for input(s): BNP in the last 8760 hours.  ProBNP (last 3 results) No results for input(s): PROBNP in the last  8760 hours.  CBG: No results for input(s): GLUCAP in the last 168 hours.  Recent Results (from the past 240 hour(s))  Culture, blood (routine x 2)     Status: None (Preliminary result)   Collection Time: 06/26/16  5:15 PM  Result Value Ref Range Status   Specimen Description BLOOD RIGHT ANTECUBITAL  Final   Special Requests BOTTLES DRAWN AEROBIC AND ANAEROBIC 5CC  Final   Culture   Final    NO GROWTH < 24 HOURS Performed at [facility], [place] [address], [place], [place] [address]    Report Status PENDING  Incomplete  Culture, blood (routine x 2)     Status: None (Preliminary result)   Collection Time: 06/26/16  6:12 PM  Result Value Ref Range Status   Specimen Description BLOOD LEFT  FOREARM  Final   Special Requests BOTTLES DRAWN AEROBIC AND ANAEROBIC 5ML  Final   Culture   Final    NO GROWTH < 24 HOURS Performed at [facility], [place] [address], [place], [place] [address]    Report Status PENDING  Incomplete  Culture, expectorated sputum-assessment     Status: None   Collection Time: 06/27/16  8:40 AM  Result Value Ref Range Status   Specimen Description SPUTUM  Final   Special Requests NONE  Final   Sputum evaluation THIS SPECIMEN IS ACCEPTABLE FOR SPUTUM CULTURE  Final   Report Status 06/27/2016 FINAL  Final     Studies: Dg Chest 2 View  Result Date: 06/26/2016 CLINICAL DATA:  Fever and leukocytosis. Cough and congestion. History of bladder cancer. EXAM: CHEST  2 VIEW COMPARISON:  02/17/2011. FINDINGS: The lung bases are partially excluded from the initial frontal examination which was repeated. On the repeat view, there are low lung volumes. There is asymmetric right perihilar airspace disease which is best seen on the frontal examination. Patchy left basilar opacity on the second frontal examination is attributed to atelectasis. No other airspace opacities, edema or significant pleural effusion identified. The heart size and mediastinal contours are stable. IMPRESSION: Right perihilar airspace disease suspicious for early pneumonia. Followup PA and lateral chest X-ray is recommended in 3-4 weeks following trial of antibiotic therapy to ensure resolution and exclude underlying malignancy. Electronically Signed   By: Richardean Sale M.D.   On: 06/26/2016 17:40    Scheduled Meds: . alfuzosin  10 mg Oral Q1200  . aspirin EC  81 mg Oral QHS  . azithromycin  500 mg Intravenous Q24H  . cefTRIAXone (ROCEPHIN)  IV  1 g Intravenous Q24H  . dextromethorphan-guaiFENesin  1 tablet Oral BID  . enoxaparin (LOVENOX) injection  40 mg Subcutaneous Q24H  . naphazoline-pheniramine  1 drop Both Eyes BID  . oseltamivir  30 mg Oral BID  . phenazopyridine  200 mg Oral TID WC    . protein supplement shake  2 oz Oral Daily    Continuous Infusions: . sodium chloride 100 mL/hr at 06/27/16 0981     Time spent: 73mins  Mykal Batiz MD, PhD  [facility] Pager [phone]. If 7PM-7AM, please contact night-coverage at [email or web address], password [email or web address] 06/27/2016, 12:28 PM  LOS: 1 day

## 2016-06-28 ENCOUNTER — Inpatient Hospital Stay (HOSPITAL_COMMUNITY): Payer: Medicare Other

## 2016-06-28 DIAGNOSIS — I509 Heart failure, unspecified: Secondary | ICD-10-CM

## 2016-06-28 DIAGNOSIS — R609 Edema, unspecified: Secondary | ICD-10-CM

## 2016-06-28 LAB — COMPREHENSIVE METABOLIC PANEL
ALT: 18 U/L (ref 17–63)
ANION GAP: 5 (ref 5–15)
AST: 20 U/L (ref 15–41)
Albumin: 2.7 g/dL — ABNORMAL LOW (ref 3.5–5.0)
Alkaline Phosphatase: 52 U/L (ref 38–126)
BILIRUBIN TOTAL: 0.8 mg/dL (ref 0.3–1.2)
BUN: 15 mg/dL (ref 6–20)
CHLORIDE: 111 mmol/L (ref 101–111)
CO2: 24 mmol/L (ref 22–32)
Calcium: 8.5 mg/dL — ABNORMAL LOW (ref 8.9–10.3)
Creatinine, Ser: 0.73 mg/dL (ref 0.61–1.24)
Glucose, Bld: 100 mg/dL — ABNORMAL HIGH (ref 65–99)
POTASSIUM: 3.7 mmol/L (ref 3.5–5.1)
Sodium: 140 mmol/L (ref 135–145)
TOTAL PROTEIN: 5.2 g/dL — AB (ref 6.5–8.1)

## 2016-06-28 LAB — CBC
HEMATOCRIT: 30.4 % — AB (ref 39.0–52.0)
Hemoglobin: 10.4 g/dL — ABNORMAL LOW (ref 13.0–17.0)
MCH: 31.1 pg (ref 26.0–34.0)
MCHC: 34.2 g/dL (ref 30.0–36.0)
MCV: 91 fL (ref 78.0–100.0)
PLATELETS: 175 10*3/uL (ref 150–400)
RBC: 3.34 MIL/uL — ABNORMAL LOW (ref 4.22–5.81)
RDW: 14 % (ref 11.5–15.5)
WBC: 10.6 10*3/uL — AB (ref 4.0–10.5)

## 2016-06-28 LAB — MAGNESIUM: MAGNESIUM: 2.2 mg/dL (ref 1.7–2.4)

## 2016-06-28 LAB — URINE CULTURE: ORGANISM ID, BACTERIA: NO GROWTH

## 2016-06-28 LAB — ECHOCARDIOGRAM COMPLETE
Height: 66 in
WEIGHTICAEL: 2405.66 [oz_av]

## 2016-06-28 NOTE — Progress Notes (Signed)
**  Preliminary report by tech**  Bilateral lower extremity venous duplex completed. There is no evidence of deep or superficial vein thrombosis involving the right and left lower extremities. All visualized vessels appear patent and compressible. There is no evidence of Baker's cysts bilaterally. Results were given to the patient's nurse, Gregary Signs.  06/28/16 9:29 AM Carlos Levering RVT

## 2016-06-28 NOTE — Progress Notes (Signed)
  Echocardiogram 2D Echocardiogram has been performed.  [place] Bueche L Androw 06/28/2016, 3:30 PM

## 2016-06-28 NOTE — Progress Notes (Signed)
PROGRESS NOTE  Geraldine Tesar EPP:[id number] DOB: Sep 18, 1929 DOA: 06/26/2016 PCP: Nyoka Cowden, MD  HPI/Recap of past 24 hours:  No fever last 24hrs, leukocytosis trending down,  less cough, on room air, denies chest pain,  No Edema  Report chronic constipation followed by Dr Watt Climes,  wife at bedside  Assessment/Plan: Principal Problem:   CAP (community acquired pneumonia)  CAP/sepsis on presentation, he presented with fever 102.7, sinsus tachycardia, heart rate 111, leukocytosis18,2, slight elevated tbili at 1.5, lactic acid 0.77 on admission He was seen by pmd due to uri symptom on 3/22 was started on tamiflu cxr on presentation :"Right perihilar airspace disease suspicious for early pneumonia" Flu swab negative, respiratory viral penal + rhinovirus, mrsa screening negative, blood culture no growth so far, sputum culture in process, urine strep pneumo antigen negative, hiv screening negative Improving, fever resolved, leukocytosis trending down, tbili normalized, Continue rocephin/zithro  Patient need to have repeat chest x ray in 3-4 weeks to ensure resolution of pneumonia   UTI?: patient has h/o bladder cancer s/p resection, s/p BCG treatment, he has been having LUTS on and off for at lease a few months ua on presentation with few bacteria, large leuk, wbc TNTC, negative nitrite Urine culture no growth,  he is already on abx  Hyponatremia (mild):  Sodium 132 on admission, normalized with gentle hydration Now eating better, d/c ivf  h/o bilateral lower extremity edema, today on exam no significant edema, patient report today his legs dose look better,  venous doppler no DVT  echo pending   Code Status: full  Family Communication: patient and wife at bedside  Disposition Plan: home hopefully on 3/26 pending clinical improvement and sputum culture result   Consultants:  none  Procedures:  none  Antibiotics:  Rocephin/zithro   Objective: BP 101/61  (BP Location: Right Arm)   Pulse 64   Temp 98.1 F (36.7 C) (Oral)   Resp 16   Ht 5\' 6"  (1.676 m)   Wt 68.2 kg (150 lb 5.7 oz)   SpO2 91%   BMI 24.27 kg/m   Intake/Output Summary (Last 24 hours) at 06/28/16 1216 Last data filed at 06/28/16 0600  Gross per 24 hour  Intake             2690 ml  Output              800 ml  Net             1890 ml   Filed Weights   06/26/16 1627 06/26/16 2214  Weight: 69.7 kg (153 lb 9.6 oz) 68.2 kg (150 lb 5.7 oz)    Exam:   General:  NAD  Cardiovascular: RRR  Respiratory: few crackles seems has resolved, no wheezing, no rhonchi  Abdomen: Soft/ND/NT, positive BS  Musculoskeletal: No Edema  Neuro: aaox3,  intentional  tremor ( report chronic)  Data Reviewed: Basic Metabolic Panel:  Recent Labs Lab 06/25/16 1553 06/26/16 1714 06/28/16 0423  NA 134* 132* 140  K 4.1 4.0 3.7  CL 101 103 111  CO2 24 22 24   GLUCOSE 112* 120* 100*  BUN 22 29* 15  CREATININE 0.89 0.92 0.73  CALCIUM 9.1 8.5* 8.5*  MG  --   --  2.2   Liver Function Tests:  Recent Labs Lab 06/26/16 1714 06/28/16 0423  AST 22 20  ALT 17 18  ALKPHOS 61 52  BILITOT 1.5* 0.8  PROT 5.9* 5.2*  ALBUMIN 3.3* 2.7*   No results for input(s): LIPASE,  AMYLASE in the last 168 hours. No results for input(s): AMMONIA in the last 168 hours. CBC:  Recent Labs Lab 06/25/16 1553 06/26/16 1714 06/28/16 0423  WBC 18.2 Repeated and verified X2.* 14.9* 10.6*  NEUTROABS 16.3* 13.3*  --   HGB 12.4* 10.8* 10.4*  HCT 37.4* 31.7* 30.4*  MCV 93.3 90.1 91.0  PLT 193.0 158 175   Cardiac Enzymes:   No results for input(s): CKTOTAL, CKMB, CKMBINDEX, TROPONINI in the last 168 hours. BNP (last 3 results) No results for input(s): BNP in the last 8760 hours.  ProBNP (last 3 results) No results for input(s): PROBNP in the last 8760 hours.  CBG: No results for input(s): GLUCAP in the last 168 hours.  Recent Results (from the past 240 hour(s))  Culture, Urine     Status: None    Collection Time: 06/25/16  3:53 PM  Result Value Ref Range Status   Organism ID, Bacteria NO GROWTH  Final  Urine culture     Status: None   Collection Time: 06/26/16  4:53 PM  Result Value Ref Range Status   Specimen Description URINE, CLEAN CATCH  Final   Special Requests NONE  Final   Culture   Final    NO GROWTH Performed at [facility], [place] [address], [place], [place] [address]    Report Status 06/27/2016 FINAL  Final  Culture, blood (routine x 2)     Status: None (Preliminary result)   Collection Time: 06/26/16  5:15 PM  Result Value Ref Range Status   Specimen Description BLOOD RIGHT ANTECUBITAL  Final   Special Requests BOTTLES DRAWN AEROBIC AND ANAEROBIC 5CC  Final   Culture   Final    NO GROWTH < 24 HOURS Performed at [facility], [place] [address], [place], [place] [address]    Report Status PENDING  Incomplete  Culture, blood (routine x 2)     Status: None (Preliminary result)   Collection Time: 06/26/16  6:12 PM  Result Value Ref Range Status   Specimen Description BLOOD LEFT FOREARM  Final   Special Requests BOTTLES DRAWN AEROBIC AND ANAEROBIC 5ML  Final   Culture   Final    NO GROWTH < 24 HOURS Performed at [facility], [place] [address] [place], [place] [address]    Report Status PENDING  Incomplete  Culture, expectorated sputum-assessment     Status: None   Collection Time: 06/27/16  8:40 AM  Result Value Ref Range Status   Specimen Description SPUTUM  Final   Special Requests NONE  Final   Sputum evaluation THIS SPECIMEN IS ACCEPTABLE FOR SPUTUM CULTURE  Final   Report Status 06/27/2016 FINAL  Final  Culture, respiratory (NON-Expectorated)     Status: None (Preliminary result)   Collection Time: 06/27/16  8:40 AM  Result Value Ref Range Status   Specimen Description SPUTUM  Final   Special Requests NONE Reflexed from [id number]  Final   Gram Stain   Final    MODERATE WBC PRESENT, PREDOMINANTLY PMN MODERATE GRAM POSITIVE COCCI IN  PAIRS MODERATE GRAM NEGATIVE RODS FEW GRAM POSITIVE RODS    Culture   Final    CULTURE REINCUBATED FOR BETTER GROWTH Performed at [facility], [place] [address], [place], [place] [address]    Report Status PENDING  Incomplete  Respiratory Panel by PCR     Status: Abnormal   Collection Time: 06/27/16  2:21 PM  Result Value Ref Range Status   Adenovirus  NOT DETECTED NOT DETECTED Final   Coronavirus 229E NOT DETECTED NOT DETECTED Final   Coronavirus HKU1 NOT DETECTED NOT DETECTED Final   Coronavirus NL63 NOT DETECTED NOT DETECTED Final   Coronavirus OC43 NOT DETECTED NOT DETECTED Final   Metapneumovirus NOT DETECTED NOT DETECTED Final   Rhinovirus / Enterovirus DETECTED (A) NOT DETECTED Final   Influenza A NOT DETECTED NOT DETECTED Final   Influenza B NOT DETECTED NOT DETECTED Final   Parainfluenza Virus 1 NOT DETECTED NOT DETECTED Final   Parainfluenza Virus 2 NOT DETECTED NOT DETECTED Final   Parainfluenza Virus 3 NOT DETECTED NOT DETECTED Final   Parainfluenza Virus 4 NOT DETECTED NOT DETECTED Final   Respiratory Syncytial Virus NOT DETECTED NOT DETECTED Final   Bordetella pertussis NOT DETECTED NOT DETECTED Final   Chlamydophila pneumoniae NOT DETECTED NOT DETECTED Final   Mycoplasma pneumoniae NOT DETECTED NOT DETECTED Final    Comment: Performed at [facility], [place] [address], [place], [place] [address]  MRSA PCR Screening     Status: None   Collection Time: 06/27/16  2:21 PM  Result Value Ref Range Status   MRSA by PCR NEGATIVE NEGATIVE Final    Comment:        The GeneXpert MRSA Assay (FDA approved for NASAL specimens only), is one component of a comprehensive MRSA colonization surveillance program. It is not intended to diagnose MRSA infection nor to guide or monitor treatment for MRSA infections.      Studies: No results found.  Scheduled Meds: . alfuzosin  10 mg Oral Q1200  . aspirin EC  81 mg Oral QHS  . azithromycin  500 mg Intravenous Q24H   . cefTRIAXone (ROCEPHIN)  IV  1 g Intravenous Q24H  . enoxaparin (LOVENOX) injection  40 mg Subcutaneous Q24H  . guaiFENesin  600 mg Oral BID  . naphazoline-pheniramine  1 drop Both Eyes BID  . phenazopyridine  200 mg Oral TID WC  . protein supplement shake  2 oz Oral Daily    Continuous Infusions: . sodium chloride 100 mL/hr at 06/27/16 1651     Time spent: 10mins  Abhishek Levesque MD, PhD  [facility] Pager [phone]. If 7PM-7AM, please contact night-coverage at [email or web address], password [facility] 06/28/2016, 12:16 PM  LOS: 2 days

## 2016-06-29 ENCOUNTER — Ambulatory Visit: Payer: Medicare Other | Admitting: Internal Medicine

## 2016-06-29 DIAGNOSIS — E876 Hypokalemia: Secondary | ICD-10-CM

## 2016-06-29 DIAGNOSIS — I471 Supraventricular tachycardia: Secondary | ICD-10-CM

## 2016-06-29 DIAGNOSIS — Z8551 Personal history of malignant neoplasm of bladder: Secondary | ICD-10-CM

## 2016-06-29 DIAGNOSIS — E871 Hypo-osmolality and hyponatremia: Secondary | ICD-10-CM

## 2016-06-29 DIAGNOSIS — B348 Other viral infections of unspecified site: Secondary | ICD-10-CM

## 2016-06-29 LAB — BASIC METABOLIC PANEL
Anion gap: 8 (ref 5–15)
BUN: 11 mg/dL (ref 6–20)
CHLORIDE: 108 mmol/L (ref 101–111)
CO2: 22 mmol/L (ref 22–32)
CREATININE: 0.7 mg/dL (ref 0.61–1.24)
Calcium: 8.6 mg/dL — ABNORMAL LOW (ref 8.9–10.3)
GFR calc Af Amer: >60 mL/min (ref >60)
GFR calc non Af Amer: >60 mL/min (ref >60)
Glucose, Bld: 93 mg/dL (ref 65–99)
Potassium: 3.3 mmol/L — ABNORMAL LOW (ref 3.5–5.1)
SODIUM: 138 mmol/L (ref 135–145)

## 2016-06-29 LAB — CBC WITH DIFFERENTIAL/PLATELET
Basophils Absolute: 0 10*3/uL (ref 0.0–0.1)
Basophils Relative: 0 %
EOS ABS: 0.2 10*3/uL (ref 0.0–0.7)
EOS PCT: 2 %
HCT: 32.3 % — ABNORMAL LOW (ref 39.0–52.0)
HEMOGLOBIN: 11.2 g/dL — AB (ref 13.0–17.0)
LYMPHS ABS: 1.2 10*3/uL (ref 0.7–4.0)
Lymphocytes Relative: 14 %
MCH: 31.2 pg (ref 26.0–34.0)
MCHC: 34.7 g/dL (ref 30.0–36.0)
MCV: 90 fL (ref 78.0–100.0)
MONO ABS: 0.7 10*3/uL (ref 0.1–1.0)
MONOS PCT: 8 %
Neutro Abs: 6.7 10*3/uL (ref 1.7–7.7)
Neutrophils Relative %: 76 %
PLATELETS: 226 10*3/uL (ref 150–400)
RBC: 3.59 MIL/uL — ABNORMAL LOW (ref 4.22–5.81)
RDW: 13.5 % (ref 11.5–15.5)
WBC: 8.8 10*3/uL (ref 4.0–10.5)

## 2016-06-29 MED ORDER — CEPHALEXIN 500 MG PO CAPS: 500.0000 mg | ORAL_CAPSULE | Freq: Two times a day (BID) | ORAL | 0 refills | Status: AC

## 2016-06-29 MED ORDER — METOPROLOL TARTRATE 25 MG PO TABS: 12.5000 mg | ORAL_TABLET | Freq: Two times a day (BID) | ORAL | 0 refills | Status: DC

## 2016-06-29 NOTE — Progress Notes (Signed)
There are no home health needs at present time.

## 2016-06-29 NOTE — Discharge Summary (Signed)
Discharge Summary  Braidan Ricciardi JOA:[id number] DOB: December 10, 1929  PCP: Nyoka Cowden, MD  Admit date: 06/26/2016 Discharge date: 06/29/2016  Time spent: <48mins  Recommendations for Outpatient Follow-up:  1. F/u with PMD within a week  for hospital discharge follow up, repeat cbc/bmp at follow up, pmd to repeat cxr in 3-4 weeks to ensure resolution of pneumonia 2. f/u with cardiology for outpatient cardiac monitor for possible episodic SVT, he is newly started on low dose lopressor at discharge.  Discharge Diagnoses:  Active Hospital Problems   Diagnosis Date Noted  . CAP (community acquired pneumonia) 06/26/2016    Resolved Hospital Problems   Diagnosis Date Noted Date Resolved  No resolved problems to display.    Discharge Condition: stable  Diet recommendation: regular diet  Filed Weights   06/26/16 1627 06/26/16 2214  Weight: 69.7 kg (153 lb 9.6 oz) 68.2 kg (150 lb 5.7 oz)    History of present illness:  Chief Complaint: Fever  HPI: Benjamin Cardenas is a 83 y.o. male with medical history significant of bladder cancer currently in remission.  Patient presents to the ED with c/o fever, cough, nasal congestion, chills.  Symptoms onset about a week ago, worsening and developed chest congestion and cough and fever.  Saw PCP yesterday, flu PCR negative, started on tamiflu anyhow.  Mucinex DM provides some relief.  Patient sent in to ED today after WBC yesterday came back at 18k.  ED Course: Tm 102.7, P 120 comes down to 107 after IVF, CXR shows R perihilar PNA.  WBC 14k  Hospital Course:  Principal Problem:   CAP (community acquired pneumonia)   CAP/sepsis on presentation, he presented with fever 102.7, sinsus tachycardia, heart rate 111, leukocytosis18,2, slight elevated tbili at 1.5, lactic acid 0.77 on admission He was seen by pmd due to URI symptom on 3/22 was started on tamiflu cxr on presentation :"Right perihilar airspace disease suspicious for early  pneumonia" Flu swab negative, respiratory viral penal + rhinovirus, mrsa screening negative, blood culture no growth so far, sputum culture normal oral/pharyngeal flora, urine strep pneumo antigen negative, hiv screening negative Improving, fever resolved, leukocytosis and tbili normalized, he is treated with rocephin/zithro in the hospital, discharged on oral keflex to finish abx course ( multiple abx allergy history) Patient need to have repeat chest x ray in 3-4 weeks to ensure resolution of pneumonia by pmd.   UTI?: patient has h/o bladder cancer s/p resection, s/p BCG treatment, he has been having LUTS on and off for at lease a few months ua on presentation with few bacteria, large leuk, wbc TNTC, negative nitrite Urine culture no growth,  he is already on abx He is to continue to follow up with urology regularly  Hyponatremia (mild):  Sodium 132 on admission, normalized with gentle hydration  hypokalemia: replace k  ? One episode of SVT on tele, patient does not seem to be symptomatic, start low dose lopressor, outpatient cardiac monitor  h/o bilateral lower extremity edema, today on exam no significant edema,  venous doppler no DVT  echo with mild lvh, no wall motion abnormalities, lvef wnl   Code Status: full  Family Communication: patient and wife at bedside  Disposition Plan: home  on 3/26    Consultants:  none  Procedures:  none  Antibiotics:  Rocephin/zithro   Discharge Exam: BP 134/76 (BP Location: Left Arm)   Pulse 74   Temp 98.4 F (36.9 C) (Oral)   Resp 20   Ht 5\' 6"  (1.676 m)  Wt 68.2 kg (150 lb 5.7 oz)   SpO2 95%   BMI 24.27 kg/m   General: NAD, frail, aaox3 Cardiovascular: RRR Respiratory: CTABL, no wheezing, no rales, no rhonchi Extremity: no edema  Discharge Instructions You were cared for by a hospitalist during your hospital stay. If you have any questions about your discharge medications or the care you received while  you were in the hospital after you are discharged, you can call the unit and asked to speak with the hospitalist on call if the hospitalist that took care of you is not available. Once you are discharged, your primary care physician will handle any further medical issues. Please note that NO REFILLS for any discharge medications will be authorized once you are discharged, as it is imperative that you return to your primary care physician (or establish a relationship with a primary care physician if you do not have one) for your aftercare needs so that they can reassess your need for medications and monitor your lab values.  Discharge Instructions    Activity as tolerated - No restrictions    Complete by:  As directed    Diet general    Complete by:  As directed      Allergies as of 06/29/2016      Reactions   Amoxicillin Rash   Has patient had a PCN reaction causing immediate rash, facial/tongue/throat swelling, SOB or lightheadedness with hypotension:No Has patient had a PCN reaction causing severe rash involving mucus membranes or skin necrosis:unsure Has patient had a PCN reaction that required hospitalization:No Has patient had a PCN reaction occurring within the last 10 years:unsure If all of the above answers are "NO", then may proceed with Cephalosporin use.   Ciprofloxacin Swelling, Rash   Sulfasalazine Swelling   Sulfa Antibiotics Swelling   Doxycycline Hyclate Rash   Levofloxacin Rash      Medication List    STOP taking these medications   oseltamivir 75 MG capsule Commonly known as:  TAMIFLU     TAKE these medications   alfuzosin 10 MG 24 hr tablet Commonly known as:  UROXATRAL Take 10 mg by mouth daily at 12 noon.   ALOE VERA JUICE Liqd Take 30 mLs by mouth every morning.   aspirin EC 81 MG tablet Take 81 mg by mouth at bedtime.   BENEFIBER DRINK MIX PO Take 5 mLs by mouth 2 (two) times daily.   cephALEXin 500 MG capsule Commonly known as:  KEFLEX Take 1  capsule (500 mg total) by mouth 2 (two) times daily.   cetirizine 10 MG tablet Commonly known as:  ZYRTEC Take 10 mg by mouth daily as needed for allergies.   dextromethorphan-guaiFENesin 30-600 MG 12hr tablet Commonly known as:  MUCINEX DM Take 1 tablet by mouth 2 (two) times daily.   metoprolol tartrate 25 MG tablet Commonly known as:  LOPRESSOR Take 0.5 tablets (12.5 mg total) by mouth 2 (two) times daily.   naphazoline-pheniramine 0.025-0.3 % ophthalmic solution Commonly known as:  NAPHCON-A Place 1 drop into both eyes 2 (two) times daily.   phenazopyridine 200 MG tablet Commonly known as:  PYRIDIUM Take 1 tablet (200 mg total) by mouth 3 (three) times daily as needed for pain.   protein supplement shake Liqd Commonly known as:  PREMIER PROTEIN Take 2 oz by mouth daily. Add to PET butter pecan ice cream for calorie/protein supplement.   triamcinolone ointment 0.1 % Commonly known as:  KENALOG Apply 1 application topically 2 (two) times daily.  TRULANCE 3 MG Tabs Generic drug:  Plecanatide Take 3 mg by mouth every morning.   TURMERIC COMPLEX/BLACK PEPPER PO Take 1 capsule by mouth daily.      Allergies  Allergen Reactions  . Amoxicillin Rash    Has patient had a PCN reaction causing immediate rash, facial/tongue/throat swelling, SOB or lightheadedness with hypotension:No Has patient had a PCN reaction causing severe rash involving mucus membranes or skin necrosis:unsure Has patient had a PCN reaction that required hospitalization:No Has patient had a PCN reaction occurring within the last 10 years:unsure If all of the above answers are "NO", then may proceed with Cephalosporin use.   . Ciprofloxacin Swelling and Rash  . Sulfasalazine Swelling  . Sulfa Antibiotics Swelling  . Doxycycline Hyclate Rash  . Levofloxacin Rash   Follow-up Information    Nyoka Cowden, MD Follow up in 1 week(s).   Specialty:  Internal Medicine Why:  hospital discharge  follow up, repeat cbc/cmp at follow up repeat cxr in 3-4 weeks to ensure resolution of pneumonia pmd to monitor heart rate, your are newly started on a low dose betablocker for tachycardia Contact information: [place] [address] [phone]        [place] [facility] Follow up.   Specialty:  Cardiology Why:  cardiology office will contact you for heart monitor Contact information: [address] [place] [phone]           The results of significant diagnostics from this hospitalization (including imaging, microbiology, ancillary and laboratory) are listed below for reference.    Significant Diagnostic Studies: Dg Chest 2 View  Result Date: 06/26/2016 CLINICAL DATA:  Fever and leukocytosis. Cough and congestion. History of bladder cancer. EXAM: CHEST  2 VIEW COMPARISON:  02/17/2011. FINDINGS: The lung bases are partially excluded from the initial frontal examination which was repeated. On the repeat view, there are low lung volumes. There is asymmetric right perihilar airspace disease which is best seen on the frontal examination. Patchy left basilar opacity on the second frontal examination is attributed to atelectasis. No other airspace opacities, edema or significant pleural effusion identified. The heart size and mediastinal contours are stable. IMPRESSION: Right perihilar airspace disease suspicious for early pneumonia. Followup PA and lateral chest X-ray is recommended in 3-4 weeks following trial of antibiotic therapy to ensure resolution and exclude underlying malignancy. Electronically Signed   By: Richardean Sale M.D.   On: 06/26/2016 17:40    Microbiology: Recent Results (from the past 240 hour(s))  Culture, Urine     Status: None   Collection Time: 06/25/16  3:53 PM  Result Value Ref Range Status   Organism ID, Bacteria NO GROWTH  Final  Urine culture     Status: None   Collection  Time: 06/26/16  4:53 PM  Result Value Ref Range Status   Specimen Description URINE, CLEAN CATCH  Final   Special Requests NONE  Final   Culture   Final    NO GROWTH Performed at [facility], [place] [address] [place], [place] [address]    Report Status 06/27/2016 FINAL  Final  Culture, blood (routine x 2)     Status: None (Preliminary result)   Collection Time: 06/26/16  5:15 PM  Result Value Ref Range Status   Specimen Description BLOOD RIGHT ANTECUBITAL  Final   Special Requests BOTTLES DRAWN AEROBIC AND ANAEROBIC 5CC  Final   Culture   Final    NO  GROWTH 2 DAYS Performed at [facility], [place] [address] [place], [place] [address]    Report Status PENDING  Incomplete  Culture, blood (routine x 2)     Status: None (Preliminary result)   Collection Time: 06/26/16  6:12 PM  Result Value Ref Range Status   Specimen Description BLOOD LEFT FOREARM  Final   Special Requests BOTTLES DRAWN AEROBIC AND ANAEROBIC 5ML  Final   Culture   Final    NO GROWTH 2 DAYS Performed at [facility], [place] [address] [place], [place] [address]    Report Status PENDING  Incomplete  Culture, expectorated sputum-assessment     Status: None   Collection Time: 06/27/16  8:40 AM  Result Value Ref Range Status   Specimen Description SPUTUM  Final   Special Requests NONE  Final   Sputum evaluation THIS SPECIMEN IS ACCEPTABLE FOR SPUTUM CULTURE  Final   Report Status 06/27/2016 FINAL  Final  Culture, respiratory (NON-Expectorated)     Status: None (Preliminary result)   Collection Time: 06/27/16  8:40 AM  Result Value Ref Range Status   Specimen Description SPUTUM  Final   Special Requests NONE Reflexed from [id number]  Final   Gram Stain   Final    MODERATE WBC PRESENT, PREDOMINANTLY PMN MODERATE GRAM POSITIVE COCCI IN PAIRS MODERATE GRAM NEGATIVE RODS FEW GRAM POSITIVE RODS    Culture   Final    CULTURE REINCUBATED FOR BETTER GROWTH Performed at [facility], [place] [address] [place], [place] [address]    Report Status PENDING  Incomplete  Respiratory Panel by PCR     Status: Abnormal   Collection Time: 06/27/16  2:21 PM  Result Value Ref Range Status   Adenovirus NOT DETECTED NOT DETECTED Final   Coronavirus 229E NOT DETECTED NOT DETECTED Final   Coronavirus HKU1 NOT DETECTED NOT DETECTED Final   Coronavirus NL63 NOT DETECTED NOT DETECTED Final   Coronavirus OC43 NOT DETECTED NOT DETECTED Final   Metapneumovirus NOT DETECTED NOT DETECTED Final   Rhinovirus / Enterovirus DETECTED (A) NOT DETECTED Final   Influenza A NOT DETECTED NOT DETECTED Final   Influenza B NOT DETECTED NOT DETECTED Final   Parainfluenza Virus 1 NOT DETECTED NOT DETECTED Final   Parainfluenza Virus 2 NOT DETECTED NOT DETECTED Final   Parainfluenza Virus 3 NOT DETECTED NOT DETECTED Final   Parainfluenza Virus 4 NOT DETECTED NOT DETECTED Final   Respiratory Syncytial Virus NOT DETECTED NOT DETECTED Final   Bordetella pertussis NOT DETECTED NOT DETECTED Final   Chlamydophila pneumoniae NOT DETECTED NOT DETECTED Final   Mycoplasma pneumoniae NOT DETECTED NOT DETECTED Final    Comment: Performed at [facility], [place] [address] [place], [place] [address]  MRSA PCR Screening     Status: None   Collection Time: 06/27/16  2:21 PM  Result Value Ref Range Status   MRSA by PCR NEGATIVE NEGATIVE Final    Comment:        The GeneXpert MRSA Assay (FDA approved for NASAL specimens only), is one component of a comprehensive MRSA colonization surveillance program. It is not intended to diagnose MRSA infection nor to guide or monitor treatment for MRSA infections.      Labs: Basic Metabolic Panel:  Recent Labs Lab 06/25/16 1553 06/26/16 1714 06/28/16 0423 06/29/16 0513  NA 134* 132* 140 138  K 4.1 4.0 3.7 3.3*  CL 101 103 111 108  CO2 24 22 24  22  GLUCOSE 112* 120* 100* 93  BUN 22 29* 15 11  CREATININE 0.89 0.92 0.73 0.70  CALCIUM 9.1 8.5* 8.5* 8.6*  MG  --   --  2.2  --      Liver Function Tests:  Recent Labs Lab 06/26/16 1714 06/28/16 0423  AST 22 20  ALT 17 18  ALKPHOS 61 52  BILITOT 1.5* 0.8  PROT 5.9* 5.2*  ALBUMIN 3.3* 2.7*   No results for input(s): LIPASE, AMYLASE in the last 168 hours. No results for input(s): AMMONIA in the last 168 hours. CBC:  Recent Labs Lab 06/25/16 1553 06/26/16 1714 06/28/16 0423 06/29/16 0513  WBC 18.2 Repeated and verified X2.* 14.9* 10.6* 8.8  NEUTROABS 16.3* 13.3*  --  6.7  HGB 12.4* 10.8* 10.4* 11.2*  HCT 37.4* 31.7* 30.4* 32.3*  MCV 93.3 90.1 91.0 90.0  PLT 193.0 158 175 226   Cardiac Enzymes: No results for input(s): CKTOTAL, CKMB, CKMBINDEX, TROPONINI in the last 168 hours. BNP: BNP (last 3 results) No results for input(s): BNP in the last 8760 hours.  ProBNP (last 3 results) No results for input(s): PROBNP in the last 8760 hours.  CBG: No results for input(s): GLUCAP in the last 168 hours.     SignedFlorencia Reasons MD, PhD  [facility] 06/29/2016, 11:43 AM

## 2016-06-30 ENCOUNTER — Other Ambulatory Visit: Payer: Self-pay | Admitting: Physician Assistant

## 2016-06-30 DIAGNOSIS — I471 Supraventricular tachycardia: Secondary | ICD-10-CM

## 2016-06-30 LAB — CULTURE, RESPIRATORY W GRAM STAIN: Culture: NORMAL

## 2016-06-30 LAB — CULTURE, RESPIRATORY

## 2016-07-01 LAB — CULTURE, BLOOD (ROUTINE X 2)
CULTURE: NO GROWTH
Culture: NO GROWTH

## 2016-07-05 NOTE — Progress Notes (Signed)
HPI:   [name] is a 83 y.o. male, who is here today with his wife to follow on recent hospitalization.  PCP Dr Burnice Logan, I saw him for acute visit on 06/25/16 c/o fever,worsening fatigue and confusion. Because severe leukocytosis he was instructed to go to ER.  She was admitted on 06/26/16 and discharged 06/29/16. Dx with CAP,treated with Rocephin and azithromycin, discharged on Kelfex, completed treatment with no problems.  HypoNa++: Mild,normalized during hospitalization after hydration with IVF. SVT: One episode of telemetry.He was started on Metoprolol 12.5 mg bid and has appt with cardiologists on 07/09/16. He denies any episode of palpitation, chest pain, or dyspnea.  Rhinovirus/enterovirus detected in respiratory panel by PCR, rest negative. Ucx and Bcx no growth in 5 days x 2.    TDD:UKGUR perihilar airspace disease suspicious for early pneumonia.  Lab Results  Component Value Date   WBC 8.8 06/29/2016   HGB 11.2 (L) 06/29/2016   HCT 32.3 (L) 06/29/2016   MCV 90.0 06/29/2016   PLT 226 06/29/2016   Lab Results  Component Value Date   CREATININE 0.70 06/29/2016   BUN 11 06/29/2016   NA 138 06/29/2016   K 3.3 (L) 06/29/2016   CL 108 06/29/2016   CO2 22 06/29/2016   Echo 06/28/16 LVEF 60-65%. There were no regional wall motion abnormalities. Left ventricular diastolic function parameters were normal. LE U/S 06/28/16: - No evidence of deep vein or superficial thrombosis involving the right lower extremity and left lower extremity. - No evidence of Baker&'s cyst on the right or left.  He denies orthopnea or PND.Sleeps on a big pillow as he has done for years.  Pending cardiac monitor.  Overall he is feeling better, no new symptoms reported. Appetite is "good", still fatigue and sleeping during the day. LE edema has improved,no pain or erythema. He has not had fever since hospital discharged. HH was not needed.   No new concerns  today.   Review of Systems  Constitutional: Positive for fatigue. Negative for appetite change, chills and fever.  HENT: Negative for mouth sores, nosebleeds, sore throat and trouble swallowing.   Eyes: Negative for redness and visual disturbance.  Respiratory: Negative for cough, shortness of breath and wheezing.   Cardiovascular: Positive for leg swelling. Negative for chest pain and palpitations.  Gastrointestinal: Positive for abdominal pain ("sometimes",lower abdomen). Negative for nausea and vomiting.       NO changes in bowel habits.  Genitourinary: Negative for decreased urine volume and hematuria.  Skin: Negative for rash.  Neurological: Negative for syncope, weakness and headaches.  Psychiatric/Behavioral: Negative for confusion, hallucinations and sleep disturbance.      Current Outpatient Prescriptions on File Prior to Visit  Medication Sig Dispense Refill  . alfuzosin (UROXATRAL) 10 MG 24 hr tablet Take 10 mg by mouth daily at 12 noon.     . ALOE VERA JUICE LIQD Take 30 mLs by mouth every morning.    [name] aspirin EC 81 MG tablet Take 81 mg by mouth at bedtime.     . Black Pepper-Turmeric (TURMERIC COMPLEX/BLACK PEPPER PO) Take 1 capsule by mouth daily.    . cetirizine (ZYRTEC) 10 MG tablet Take 10 mg by mouth daily as needed for allergies.    [name] dextromethorphan-guaiFENesin (MUCINEX DM) 30-600 MG 12hr tablet Take 1 tablet by mouth 2 (two) times daily.    . metoprolol tartrate (LOPRESSOR) 25 MG tablet Take 0.5 tablets (12.5 mg total) by mouth 2 (two) times daily. [place]  tablet 0  . naphazoline-pheniramine (NAPHCON-A) 0.025-0.3 % ophthalmic solution Place 1 drop into both eyes 2 (two) times daily.    . phenazopyridine (PYRIDIUM) 200 MG tablet Take 1 tablet (200 mg total) by mouth 3 (three) times daily as needed for pain. 20 tablet 0  . protein supplement shake (PREMIER PROTEIN) LIQD Take 2 oz by mouth daily. Add to PET butter pecan ice cream for calorie/protein supplement.    .  triamcinolone ointment (KENALOG) 0.1 % Apply 1 application topically 2 (two) times daily.     . TRULANCE 3 MG TABS Take 3 mg by mouth every morning.     . Wheat Dextrin (BENEFIBER DRINK MIX PO) Take 5 mLs by mouth 2 (two) times daily.      No current facility-administered medications on file prior to visit.      Past Medical History:  Diagnosis Date  . Bladder cancer [facility]) urology-- dr Karsten Ro   dx 11/ 2016  Transitional cell carcinoma, High grade invasive , Stage T1  G3 ---  s/p  TURBT and Mitomycin C instillation  . BPH with urinary obstruction   . Dry skin dermatitis   . Elevated PSA   . History of colon polyps   . Hydronephrosis, left   . Lower urinary tract symptoms (LUTS)   . OA (osteoarthritis)   . Prostate nodule   . Seasonal allergic rhinitis   . Wears glasses    Allergies  Allergen Reactions  . Amoxicillin Rash    Has patient had a PCN reaction causing immediate rash, facial/tongue/throat swelling, SOB or lightheadedness with hypotension:No Has patient had a PCN reaction causing severe rash involving mucus membranes or skin necrosis:unsure Has patient had a PCN reaction that required hospitalization:No Has patient had a PCN reaction occurring within the last 10 years:unsure If all of the above answers are "NO", then may proceed with Cephalosporin use.   . Ciprofloxacin Swelling and Rash  . Sulfasalazine Swelling  . Sulfa Antibiotics Swelling  . Doxycycline Hyclate Rash  . Levofloxacin Rash    Social History   Social History  . Marital status: Married    Spouse name: N/A  . Number of children: N/A  . Years of education: N/A   Social History Main Topics  . Smoking status: Former Smoker    Packs/day: 1.50    Years: 34.00    Types: Cigarettes    Quit date: 02/17/1979  . Smokeless tobacco: Never Used  . Alcohol use Yes     Comment: occasional  . Drug use: No  . Sexual activity: Not Asked   Other Topics Concern  . None   Social History Narrative  .  None    Vitals:   07/06/16 1352  BP: 118/68  Pulse: 85  Resp: 12  O2 sat at RA 95% Body mass index is 23.61 kg/m.  Physical Exam  Nursing note and vitals reviewed. Constitutional: He is oriented to person, place, and time. He appears well-developed and well-nourished. No distress.  HENT:  Head: Atraumatic.  Mouth/Throat: Oropharynx is clear and moist and mucous membranes are normal.  Eyes: Conjunctivae and EOM are normal.  Neck: No JVD present.  Cardiovascular: Normal rate and regular rhythm.   No murmur heard. Pulses:      Dorsalis pedis pulses are 2+ on the right side, and 2+ on the left side.  Respiratory: Effort normal and breath sounds normal. No respiratory distress.  GI: Soft. He exhibits no mass. There is no hepatomegaly. There is no tenderness.  Musculoskeletal: He exhibits edema (1+ pitting edema LE,bilateral.). He exhibits no tenderness.  Lymphadenopathy:    He has no cervical adenopathy.  Neurological: He is alert and oriented to person, place, and time. He has normal strength.  Skin: Skin is warm. No erythema.  Psychiatric: He has a normal mood and affect. Cognition and memory are normal.  Well groomed, good eye contact.      ASSESSMENT AND PLAN:   Skye was seen today for hospitalization follow-up.  Diagnoses and all orders for this visit: Lab Results  Component Value Date   WBC 7.8 07/06/2016   HGB 12.4 (L) 07/06/2016   HCT 37.0 (L) 07/06/2016   MCV 92.1 07/06/2016   PLT 368.0 07/06/2016   Lab Results  Component Value Date   CREATININE 0.83 07/06/2016   BUN 20 07/06/2016   NA 140 07/06/2016   K 4.8 07/06/2016   CL 103 07/06/2016   CO2 32 07/06/2016     Community acquired pneumonia of right middle lobe of lung ([place])  Resolved. No further recommendations at this time. CXR order placed to be done in 3-4 weeks.  -     CBC -     DG Chest 2 View; Future  Hypokalemia  Normal oral intake at this time, so continue K+ rich diet. Further  recommendations would be given according to lab results.  -     Basic metabolic panel  SVT (supraventricular tachycardia) (HCC)  Asymptomatic. No changes in current management. Keep appointment with cardiologists. Instructed about warning signs.  -     Basic metabolic panel -     Magnesium   Fall precautions discussed. He will follow with PCP in 12/2016,when he is due for his routine visit.    Sidrah Harden G. Martinique, MD  [facility]. [place] office.

## 2016-07-06 ENCOUNTER — Encounter: Payer: Self-pay | Admitting: Family Medicine

## 2016-07-06 ENCOUNTER — Ambulatory Visit (INDEPENDENT_AMBULATORY_CARE_PROVIDER_SITE_OTHER): Payer: Medicare Other | Admitting: Family Medicine

## 2016-07-06 VITALS — BP 118/68 | HR 85 | Resp 12 | Ht 66.0 in | Wt 146.2 lb

## 2016-07-06 DIAGNOSIS — E876 Hypokalemia: Secondary | ICD-10-CM | POA: Diagnosis not present

## 2016-07-06 DIAGNOSIS — I471 Supraventricular tachycardia: Secondary | ICD-10-CM | POA: Diagnosis not present

## 2016-07-06 DIAGNOSIS — J189 Pneumonia, unspecified organism: Secondary | ICD-10-CM

## 2016-07-06 DIAGNOSIS — J181 Lobar pneumonia, unspecified organism: Secondary | ICD-10-CM | POA: Diagnosis not present

## 2016-07-06 LAB — MAGNESIUM: MAGNESIUM: 2.5 mg/dL (ref 1.5–2.5)

## 2016-07-06 LAB — BASIC METABOLIC PANEL
BUN: 20 mg/dL (ref 6–23)
CALCIUM: 9.5 mg/dL (ref 8.4–10.5)
CO2: 32 meq/L (ref 19–32)
CREATININE: 0.83 mg/dL (ref 0.40–1.50)
Chloride: 103 mEq/L (ref 96–112)
GFR: 93.62 mL/min (ref >60.00)
Glucose, Bld: 100 mg/dL — ABNORMAL HIGH (ref 70–99)
Potassium: 4.8 mEq/L (ref 3.5–5.1)
Sodium: 140 mEq/L (ref 135–145)

## 2016-07-06 LAB — CBC
HCT: 37 % — ABNORMAL LOW (ref 39.0–52.0)
Hemoglobin: 12.4 g/dL — ABNORMAL LOW (ref 13.0–17.0)
MCHC: 33.6 g/dL (ref 30.0–36.0)
MCV: 92.1 fl (ref 78.0–100.0)
Platelets: 368 10*3/uL (ref 150.0–400.0)
RBC: 4.02 Mil/uL — ABNORMAL LOW (ref 4.22–5.81)
RDW: 13.6 % (ref 11.5–15.5)
WBC: 7.8 10*3/uL (ref 4.0–10.5)

## 2016-07-06 NOTE — Progress Notes (Signed)
Pre visit review using our clinic review tool, if applicable. No additional management support is needed unless otherwise documented below in the visit note. 

## 2016-07-06 NOTE — Patient Instructions (Addendum)
A few things to remember from today's visit:   Community acquired pneumonia of right middle lobe of lung ([place]) - Plan: CBC, DG Chest 2 View  Hypokalemia - Plan: Basic metabolic panel  SVT (supraventricular tachycardia) ([place]) - Plan: Basic metabolic panel, Magnesium  Keep appointment with heart doctor.  Chest X ray 08/03/16.   Fall precautions.   Please be sure medication list is accurate. If a new problem present, please set up appointment sooner than planned today.

## 2016-07-08 ENCOUNTER — Ambulatory Visit: Payer: Medicare Other | Admitting: Internal Medicine

## 2016-07-09 ENCOUNTER — Ambulatory Visit (INDEPENDENT_AMBULATORY_CARE_PROVIDER_SITE_OTHER): Payer: Medicare Other

## 2016-07-09 DIAGNOSIS — I471 Supraventricular tachycardia: Secondary | ICD-10-CM

## 2016-07-20 ENCOUNTER — Encounter: Payer: Self-pay | Admitting: Internal Medicine

## 2016-07-23 NOTE — Telephone Encounter (Signed)
Pts wife is calling concerning the pts Beta Blocker that the pt came home with from the hospital they are wanting to know if he should d/c it if not he will need a refill.  Pharm:  [facility] on [address].  Pts wife stated that he was seen by Dr. Martinique for the hospital follow-up.

## 2016-07-23 NOTE — Telephone Encounter (Signed)
Please advise 

## 2016-07-24 ENCOUNTER — Other Ambulatory Visit: Payer: Self-pay | Admitting: Internal Medicine

## 2016-07-24 ENCOUNTER — Other Ambulatory Visit: Payer: Self-pay | Admitting: Family Medicine

## 2016-07-24 DIAGNOSIS — I471 Supraventricular tachycardia: Secondary | ICD-10-CM

## 2016-07-24 MED ORDER — METOPROLOL TARTRATE 25 MG PO TABS: 12.5000 mg | ORAL_TABLET | Freq: Two times a day (BID) | ORAL | 1 refills | Status: DC

## 2016-08-03 ENCOUNTER — Ambulatory Visit (INDEPENDENT_AMBULATORY_CARE_PROVIDER_SITE_OTHER)
Admission: RE | Admit: 2016-08-03 | Discharge: 2016-08-03 | Disposition: A | Discharge status: Home / Self Care | Payer: Medicare Other | Source: Ambulatory Visit | Attending: Family Medicine | Admitting: Family Medicine

## 2016-08-03 ENCOUNTER — Encounter: Payer: Self-pay | Admitting: Family Medicine

## 2016-08-03 DIAGNOSIS — J189 Pneumonia, unspecified organism: Secondary | ICD-10-CM | POA: Diagnosis not present

## 2016-08-03 DIAGNOSIS — J181 Lobar pneumonia, unspecified organism: Secondary | ICD-10-CM

## 2016-08-13 ENCOUNTER — Encounter: Payer: Self-pay | Admitting: Physician Assistant

## 2016-08-13 NOTE — Progress Notes (Signed)
Cardiology Office Note    Date:  08/17/2016   ID:  Star Cheese, DOB 1930/03/28, MRN [id number]  PCP:  Marletta Lor, MD  Cardiologist:  New to Dr. Johnsie Cancel   Chief Complaint: Establish care for SVT  History of Present Illness:   Benjamin Cardenas is a 83 y.o. male with hx of bladder cancer s/p resection & BCG treatment (in remission) who recently referred by PCP for evaluation of SVT.  Patient was admitted 3/23-3/26 for CAP and sepsis. Patient had one episode of SVT during admission. Asymptomatic. Started on low dose BB. Placed on outpatient monitor (result is still pending, mailed monitor back to company last week).   Echo 06/2016 showed normal LV function, mild LVH, PA pressure of 37 mm Hg.   Here today for further evaluation. Patient has been feeling fatigued and tired since started on metoprolol 25 mg twice a day. No dizziness, syncope, orthopnea, PND or palpitations. He does complains of intermittent shortness of breath lasting for few seconds to a minute. He denies any chest pain/tightness or discomfort. However, does grab his chest with his hand. No prior history of CAD or MI. He has a intermittent lower extremity edema that has been resolved after using compression stocking.  Father died of MI at age 27. Remote history of tobacco smoking from 1957 to 1980.   Past Medical History:  Diagnosis Date  . Bladder cancer [facility]) urology-- dr Karsten Ro   dx 11/ 2016  Transitional cell carcinoma, High grade invasive , Stage T1  G3 ---  s/p  TURBT and Mitomycin C instillation  . BPH with urinary obstruction   . Dry skin dermatitis   . Elevated PSA   . History of colon polyps   . Hydronephrosis, left   . Lower urinary tract symptoms (LUTS)   . OA (osteoarthritis)   . Prostate nodule   . Seasonal allergic rhinitis   . Wears glasses     Past Surgical History:  Procedure Laterality Date  . CYSTOSCOPY WITH BIOPSY N/A 04/22/2015   Procedure: CYSTOSCOPY WITH BLADDER BIOPSY;   Surgeon: Kathie Rhodes, MD;  Location: [facility];  Service: Urology;  Laterality: N/A;  . CYSTOSCOPY WITH RETROGRADE PYELOGRAM, URETEROSCOPY AND STENT PLACEMENT Left 02/25/2015   Procedure: LEFT RETROGRADE PYELOGRAM, URETEROSCOPY ;  Surgeon: Kathie Rhodes, MD;  Location: [facility];  Service: Urology;  Laterality: Left;  . HERNIA REPAIR  age 48 1937   rupture right inguinal hernia  . INGUINAL HERNIA REPAIR  03/02/2011   Procedure: HERNIA REPAIR INGUINAL ADULT;  Surgeon: Earnstine Regal, MD;  Location: [facility];  Service: General;  Laterality: Left;  with mesh   . TRANSURETHRAL RESECTION OF BLADDER TUMOR WITH GYRUS (TURBT-GYRUS) N/A 02/25/2015   Procedure: TRANSURETHRAL RESECTION OF BLADDER TUMOR WITH GYRUS (TURBT-GYRUS);  Surgeon: Kathie Rhodes, MD;  Location: [facility];  Service: Urology;  Laterality: N/A;    Current Medications: Prior to Admission medications   Medication Sig Start Date End Date Taking? Authorizing Provider  alfuzosin (UROXATRAL) 10 MG 24 hr tablet Take 10 mg by mouth daily at 12 noon.  11/11/15   [provider]  ALOE VERA JUICE LIQD Take 30 mLs by mouth every morning.    [provider]  aspirin EC 81 MG tablet Take 81 mg by mouth at bedtime.     [provider]  Black Pepper-Turmeric (TURMERIC COMPLEX/BLACK PEPPER PO) Take 1 capsule by mouth daily.    [provider]  cetirizine (ZYRTEC) 10 MG tablet Take 10 mg by mouth daily as needed for allergies.    [provider]  dextromethorphan-guaiFENesin (MUCINEX DM) 30-600 MG 12hr tablet Take 1 tablet by mouth 2 (two) times daily.    [provider]  metoprolol tartrate (LOPRESSOR) 25 MG tablet Take 0.5 tablets (12.5 mg total) by mouth 2 (two) times daily. 07/24/16   Martin[place], Betty G, MD  naphazoline-pheniramine (NAPHCON-A) 0.025-0.3 % ophthalmic solution Place 1 drop into both eyes 2 (two) times daily.    [provider]    phenazopyridine (PYRIDIUM) 200 MG tablet Take 1 tablet (200 mg total) by mouth 3 (three) times daily as needed for pain. 04/22/15   Kathie Rhodes, MD  protein supplement shake (PREMIER PROTEIN) LIQD Take 2 oz by mouth daily. Add to PET butter pecan ice cream for calorie/protein supplement.    [provider]  triamcinolone ointment (KENALOG) 0.1 % Apply 1 application topically 2 (two) times daily.  12/25/15   [provider]  TRULANCE 3 MG TABS Take 3 mg by mouth every morning.  10/29/15   [provider]  Wheat Dextrin (BENEFIBER DRINK MIX PO) Take 5 mLs by mouth 2 (two) times daily.     [provider]    Allergies:   Amoxicillin; Ciprofloxacin; Sulfasalazine; Sulfa antibiotics; Doxycycline hyclate; and Levofloxacin   Social History   Social History  . Marital status: Married    Spouse name: N/A  . Number of children: N/A  . Years of education: N/A   Social History Main Topics  . Smoking status: Former Smoker    Packs/day: 1.50    Years: 34.00    Types: Cigarettes    Quit date: 02/17/1979  . Smokeless tobacco: Never Used  . Alcohol use Yes     Comment: occasional  . Drug use: No  . Sexual activity: Not Asked   Other Topics Concern  . None   Social History Narrative  . None     Family History:  The patient's family history includes Cancer in his brother and sister; Heart disease in his father and mother.  ROS:   Please see the history of present illness.    ROS All other systems reviewed and are negative.   PHYSICAL EXAM:   VS:  BP (!) 104/58   Pulse (!) 54   Ht 5\' 6"  (1.676 m)   Wt 150 lb (68 kg)   SpO2 98%   BMI 24.21 kg/m    GEN: Well nourished, well developed, in no acute distress  HEENT: normal  Neck: no JVD, carotid bruits, or masses Cardiac: RRR; no murmurs, rubs, or gallops,no edema  Respiratory:  clear to auscultation bilaterally, normal work of breathing GI: soft, nontender, nondistended, + BS MS: no deformity or  atrophy  Skin: warm and dry, no rash Neuro:  Alert and Oriented x 3, Strength and sensation are intact Psych: euthymic mood, full affect  Wt Readings from Last 3 Encounters:  08/17/16 150 lb (68 kg)  07/06/16 146 lb 4 oz (66.3 kg)  06/26/16 150 lb 5.7 oz (68.2 kg)      Studies/Labs Reviewed:   EKG:  EKG is ordered today.  The ekg ordered today demonstrates Sinus bradycardia at rate of 53 bpm with PA-C  Recent Labs: 12/13/2015: TSH 3.281 06/28/2016: ALT 18 07/06/2016: BUN 20; Creatinine, Ser 0.83; Hemoglobin 12.4; Magnesium 2.5; Platelets 368.0; Potassium 4.8; Sodium 140   Lipid Panel    Component Value Date/Time   CHOL 174  12/05/2014 0813   TRIG 50.0 12/05/2014 0813   HDL 66.50 12/05/2014 0813   CHOLHDL 3 12/05/2014 0813   VLDL 10.0 12/05/2014 0813   LDLCALC 97 12/05/2014 0813   LDLDIRECT 113.9 09/23/2010 0952    Additional studies/ records that were reviewed today include:   Echocardiogram: 06/28/16 Study Conclusions  - Left ventricle: The cavity size was normal. Wall thickness was   increased in a pattern of mild LVH. Systolic function was normal.   The estimated ejection fraction was in the range of 60% to 65%.   Wall motion was normal; there were no regional wall motion   abnormalities. Left ventricular diastolic function parameters   were normal. - Aortic valve: Valve area (VTI): 1.85 cm^2. Valve area (Vmax):   1.88 cm^2. Valve area (Vmean): 2.13 cm^2. - Atrial septum: No defect or patent foramen ovale was identified. - Pulmonary arteries: Systolic pressure was mildly increased. PA   peak pressure: 37 mm Hg (S). - Inferior vena cava: The vessel was dilated. The respirophasic   diameter changes were blunted (< 50%), consistent with elevated   central venous pressure. - Technically adequate study.    ASSESSMENT & PLAN:    1. SVT - Monitor reading still pending. He has been feeling extreme fatigue and tiredness since started on metoprolol. Likely due to  bradycardia. Heart rate in 50s today. We will discontinue metoprolol. Follow up with [place] when necessary unless abnormal monitor reading. Echo showed normal EF.  2. Fatigue and tiredness - Likely due to beta blocker.  Discussed with DOD Dr. Johnsie Cancel.       Medication Adjustments/Labs and Tests Ordered: Current medicines are reviewed at length with the patient today.  Concerns regarding medicines are outlined above.  Medication changes, Labs and Tests ordered today are listed in the Patient Instructions below. There are no Patient Instructions on file for this visit.   Jarrett Soho, [place]  08/17/2016 11:52 AM    [facility] [place], [place], [place]  [address] Phone: [phone]; Fax: [phone]

## 2016-08-17 ENCOUNTER — Ambulatory Visit (INDEPENDENT_AMBULATORY_CARE_PROVIDER_SITE_OTHER): Payer: Medicare Other | Admitting: Physician Assistant

## 2016-08-17 ENCOUNTER — Encounter: Payer: Self-pay | Admitting: Physician Assistant

## 2016-08-17 VITALS — BP 104/58 | HR 54 | Ht 66.0 in | Wt 150.0 lb

## 2016-08-17 DIAGNOSIS — I471 Supraventricular tachycardia: Secondary | ICD-10-CM | POA: Diagnosis not present

## 2016-08-17 DIAGNOSIS — R5383 Other fatigue: Secondary | ICD-10-CM | POA: Diagnosis not present

## 2016-08-17 NOTE — Patient Instructions (Signed)
Medication Instructions:  Your physician has recommended you make the following change in your medication: 1.) STOP Metoprolol  Labwork: None Ordered   Testing/Procedures: None Ordered   Follow-Up: Your physician recommends that you schedule a follow-up appointment in: as needed  Any Other Special Instructions Will Be Listed Below (If Applicable).     If you need a refill on your cardiac medications before your next appointment, please call your pharmacy.  Thank you for choosing [facility]

## 2016-08-25 DIAGNOSIS — R8279 Other abnormal findings on microbiological examination of urine: Secondary | ICD-10-CM | POA: Diagnosis not present

## 2016-08-25 DIAGNOSIS — Z8571 Personal history of Hodgkin lymphoma: Secondary | ICD-10-CM | POA: Diagnosis not present

## 2016-08-25 DIAGNOSIS — Z8551 Personal history of malignant neoplasm of bladder: Secondary | ICD-10-CM | POA: Diagnosis not present

## 2016-08-30 ENCOUNTER — Encounter: Payer: Self-pay | Admitting: Family Medicine

## 2016-09-15 DIAGNOSIS — H1013 Acute atopic conjunctivitis, bilateral: Secondary | ICD-10-CM | POA: Diagnosis not present

## 2016-09-22 DIAGNOSIS — M67449 Ganglion, unspecified hand: Secondary | ICD-10-CM | POA: Diagnosis not present

## 2016-10-02 DIAGNOSIS — M6740 Ganglion, unspecified site: Secondary | ICD-10-CM | POA: Diagnosis not present

## 2016-10-22 DIAGNOSIS — L821 Other seborrheic keratosis: Secondary | ICD-10-CM | POA: Diagnosis not present

## 2016-10-22 DIAGNOSIS — L309 Dermatitis, unspecified: Secondary | ICD-10-CM | POA: Diagnosis not present

## 2016-10-22 DIAGNOSIS — L570 Actinic keratosis: Secondary | ICD-10-CM | POA: Diagnosis not present

## 2016-10-22 DIAGNOSIS — L308 Other specified dermatitis: Secondary | ICD-10-CM | POA: Diagnosis not present

## 2016-11-18 DIAGNOSIS — H2513 Age-related nuclear cataract, bilateral: Secondary | ICD-10-CM | POA: Diagnosis not present

## 2016-11-24 DIAGNOSIS — M67449 Ganglion, unspecified hand: Secondary | ICD-10-CM | POA: Diagnosis not present

## 2016-12-01 DIAGNOSIS — Z8551 Personal history of malignant neoplasm of bladder: Secondary | ICD-10-CM | POA: Diagnosis not present

## 2016-12-01 DIAGNOSIS — R8279 Other abnormal findings on microbiological examination of urine: Secondary | ICD-10-CM | POA: Diagnosis not present

## 2016-12-24 ENCOUNTER — Encounter: Payer: Self-pay | Admitting: Internal Medicine

## 2017-01-01 ENCOUNTER — Encounter: Payer: Self-pay | Admitting: Internal Medicine

## 2017-01-01 ENCOUNTER — Ambulatory Visit (INDEPENDENT_AMBULATORY_CARE_PROVIDER_SITE_OTHER): Payer: Medicare Other | Admitting: Internal Medicine

## 2017-01-01 VITALS — BP 118/68 | HR 64 | Temp 97.5°F | Ht 66.0 in | Wt 146.2 lb

## 2017-01-01 DIAGNOSIS — N400 Enlarged prostate without lower urinary tract symptoms: Secondary | ICD-10-CM | POA: Diagnosis not present

## 2017-01-01 DIAGNOSIS — R413 Other amnesia: Secondary | ICD-10-CM | POA: Diagnosis not present

## 2017-01-01 DIAGNOSIS — Z86010 Personal history of colonic polyps: Secondary | ICD-10-CM

## 2017-01-01 DIAGNOSIS — M150 Primary generalized (osteo)arthritis: Secondary | ICD-10-CM | POA: Diagnosis not present

## 2017-01-01 DIAGNOSIS — M159 Polyosteoarthritis, unspecified: Secondary | ICD-10-CM

## 2017-01-01 LAB — COMPREHENSIVE METABOLIC PANEL
ALT: 12 U/L (ref 0–53)
AST: 18 U/L (ref 0–37)
Albumin: 4.1 g/dL (ref 3.5–5.2)
Alkaline Phosphatase: 66 U/L (ref 39–117)
BILIRUBIN TOTAL: 0.8 mg/dL (ref 0.2–1.2)
BUN: 16 mg/dL (ref 6–23)
CALCIUM: 9.7 mg/dL (ref 8.4–10.5)
CHLORIDE: 102 meq/L (ref 96–112)
CO2: 31 meq/L (ref 19–32)
Creatinine, Ser: 1.04 mg/dL (ref 0.40–1.50)
GFR: 72.08 mL/min (ref >60.00)
GLUCOSE: 99 mg/dL (ref 70–99)
POTASSIUM: 4.6 meq/L (ref 3.5–5.1)
Sodium: 138 mEq/L (ref 135–145)
Total Protein: 6.4 g/dL (ref 6.0–8.3)

## 2017-01-01 LAB — CBC WITH DIFFERENTIAL/PLATELET
BASOS ABS: 0.1 10*3/uL (ref 0.0–0.1)
Basophils Relative: 0.8 % (ref 0.0–3.0)
EOS PCT: 2.8 % (ref 0.0–5.0)
Eosinophils Absolute: 0.2 10*3/uL (ref 0.0–0.7)
HEMATOCRIT: 38.3 % — AB (ref 39.0–52.0)
Hemoglobin: 12.7 g/dL — ABNORMAL LOW (ref 13.0–17.0)
LYMPHS ABS: 1.5 10*3/uL (ref 0.7–4.0)
LYMPHS PCT: 23 % (ref 12.0–46.0)
MCHC: 33.1 g/dL (ref 30.0–36.0)
MCV: 95.3 fl (ref 78.0–100.0)
MONOS PCT: 6.5 % (ref 3.0–12.0)
Monocytes Absolute: 0.4 10*3/uL (ref 0.1–1.0)
NEUTROS ABS: 4.4 10*3/uL (ref 1.4–7.7)
Neutrophils Relative %: 66.9 % (ref 43.0–77.0)
Platelets: 253 10*3/uL (ref 150.0–400.0)
RBC: 4.03 Mil/uL — ABNORMAL LOW (ref 4.22–5.81)
RDW: 13.6 % (ref 11.5–15.5)
WBC: 6.6 10*3/uL (ref 4.0–10.5)

## 2017-01-01 LAB — TSH: TSH: 4.8 u[IU]/mL — AB (ref 0.35–4.50)

## 2017-01-01 NOTE — Progress Notes (Signed)
Subjective:    Patient ID: Benjamin Cardenas, male    DOB: Mar 02, 1930, 83 y.o.   MRN: [id number]  HPI  83 year old patient who is seen today for follow-up and for a subsequent Medicare wellness visit. He is followed closely by urology at three-month intervals.  He is status post TUR bladder tumor in December 2016 for an invasive papillary cancer. He was hospitalized in the spring for a right-sided pneumonia.  He is doing well today and his only complaint is memory issues.  MMSE 2014 2016  30/30  Past Medical History:  Diagnosis Date  . Bladder cancer [facility]) urology-- dr Karsten Ro   dx 11/ 2016  Transitional cell carcinoma, High grade invasive , Stage T1  G3 ---  s/p  TURBT and Mitomycin C instillation  . BPH with urinary obstruction   . Dry skin dermatitis   . Elevated PSA   . History of colon polyps   . Hydronephrosis, left   . Lower urinary tract symptoms (LUTS)   . OA (osteoarthritis)   . Prostate nodule   . Seasonal allergic rhinitis   . Wears glasses      Social History   Social History  . Marital status: Married    Spouse name: N/A  . Number of children: N/A  . Years of education: N/A   Occupational History  . Not on file.   Social History Main Topics  . Smoking status: Former Smoker    Packs/day: 1.50    Years: 34.00    Types: Cigarettes    Quit date: 02/17/1979  . Smokeless tobacco: Never Used  . Alcohol use Yes     Comment: occasional  . Drug use: No  . Sexual activity: Not on file   Other Topics Concern  . Not on file   Social History Narrative  . No narrative on file    Past Surgical History:  Procedure Laterality Date  . CYSTOSCOPY WITH BIOPSY N/A 04/22/2015   Procedure: CYSTOSCOPY WITH BLADDER BIOPSY;  Surgeon: Kathie Rhodes, MD;  Location: [facility];  Service: Urology;  Laterality: N/A;  . CYSTOSCOPY WITH RETROGRADE PYELOGRAM, URETEROSCOPY AND STENT PLACEMENT Left 02/25/2015   Procedure: LEFT RETROGRADE PYELOGRAM, URETEROSCOPY  ;  Surgeon: Kathie Rhodes, MD;  Location: [facility];  Service: Urology;  Laterality: Left;  . HERNIA REPAIR  age 41 1937   rupture right inguinal hernia  . INGUINAL HERNIA REPAIR  03/02/2011   Procedure: HERNIA REPAIR INGUINAL ADULT;  Surgeon: Earnstine Regal, MD;  Location: [facility];  Service: General;  Laterality: Left;  with mesh   . TRANSURETHRAL RESECTION OF BLADDER TUMOR WITH GYRUS (TURBT-GYRUS) N/A 02/25/2015   Procedure: TRANSURETHRAL RESECTION OF BLADDER TUMOR WITH GYRUS (TURBT-GYRUS);  Surgeon: Kathie Rhodes, MD;  Location: [facility];  Service: Urology;  Laterality: N/A;    Family History  Problem Relation Age of Onset  . Heart disease Mother   . Heart disease Father   . Cancer Sister        breast  . Cancer Brother        colon    Allergies  Allergen Reactions  . Amoxicillin Rash    Has patient had a PCN reaction causing immediate rash, facial/tongue/throat swelling, SOB or lightheadedness with hypotension:No Has patient had a PCN reaction causing severe rash involving mucus membranes or skin necrosis:unsure Has patient had a PCN reaction that required hospitalization:No Has patient had a PCN reaction occurring within the last 10 years:unsure If all  of the above answers are "NO", then may proceed with Cephalosporin use.   . Ciprofloxacin Swelling and Rash  . Sulfasalazine Swelling  . Sulfa Antibiotics Swelling  . Doxycycline Hyclate Rash  . Levofloxacin Rash    Current Outpatient Prescriptions on File Prior to Visit  Medication Sig Dispense Refill  . alfuzosin (UROXATRAL) 10 MG 24 hr tablet Take 10 mg by mouth daily at 12 noon.     . ALOE VERA JUICE LIQD Take 30 mLs by mouth every morning.    [name] aspirin EC 81 MG tablet Take 81 mg by mouth at bedtime.     . Black Pepper-Turmeric (TURMERIC COMPLEX/BLACK PEPPER PO) Take 1 capsule by mouth daily.    . cetirizine (ZYRTEC) 10 MG tablet Take 10 mg by mouth daily as needed for allergies.    .  naphazoline-pheniramine (NAPHCON-A) 0.025-0.3 % ophthalmic solution Place 1 drop into both eyes 2 (two) times daily.    . phenazopyridine (PYRIDIUM) 200 MG tablet Take 1 tablet (200 mg total) by mouth 3 (three) times daily as needed for pain. 20 tablet 0  . protein supplement shake (PREMIER PROTEIN) LIQD Take 2 oz by mouth daily. Add to PET butter pecan ice cream for calorie/protein supplement.    . triamcinolone ointment (KENALOG) 0.1 % Apply 1 application topically 2 (two) times daily.     . TRULANCE 3 MG TABS Take 3 mg by mouth every morning.     . Wheat Dextrin (BENEFIBER DRINK MIX PO) Take 5 mLs by mouth 2 (two) times daily.      No current facility-administered medications on file prior to visit.     BP 118/68 (BP Location: Left Arm, Patient Position: Sitting, Cuff Size: Normal)   Pulse 64   Temp (!) 97.5 F (36.4 C) (Oral)   Ht 5\' 6"  (1.676 m)   Wt 146 lb 3.2 oz (66.3 kg)   SpO2 93%   BMI 23.60 kg/m   Medicare wellness visit  1. Risk factors, based on past  M,S,F history.  Cardio vascular risk factors include age and sex  2.  Physical activities:remains fairly active.  Does participate at activities at his gym 2-3 times per week including walking on the treadmill and upper body resistance training  3.  Depression/mood:no history of major depression or mood disorder  4.  Hearing:mild deficits only  5.  ADL's:independent  6.  Fall risk:moderate due to age  94.  Home safety:no problems identified  8.  Height weight, and visual acuity;height and weight stable no change in visual acuity  9.  Counseling:we'll continue active lifestyle with physical activity and activities such as reading and crossword puzzles for mental stimulation  10. Lab orders based on risk factors:Will review updated lab  11. Referral :follow-up urology  12. Care plan:continue close urological follow-up.  If any efforts at aggressive risk factor modification  13. Cognitive assessment: alert alert in  order with normal affect.  No cognitive dysfunction.  Does complain of forgetfulness  14. Screening: Patient provided with a written and personalized 5-10 year screening schedule in the AVS.    15. Provider List Update: urology primary care cardiology and oncology    Review of Systems  Constitutional: Negative for appetite change, chills, fatigue and fever.  HENT: Negative for congestion, dental problem, ear pain, hearing loss, sore throat, tinnitus, trouble swallowing and voice change.   Eyes: Negative for pain, discharge and visual disturbance.  Respiratory: Negative for cough, chest tightness, wheezing and stridor.   Cardiovascular:  Negative for chest pain, palpitations and leg swelling.  Gastrointestinal: Positive for abdominal pain. Negative for abdominal distention, blood in stool, constipation, diarrhea, nausea and vomiting.  Genitourinary: Negative for difficulty urinating, discharge, flank pain, genital sores, hematuria and urgency.  Musculoskeletal: Positive for arthralgias. Negative for back pain, gait problem, joint swelling, myalgias and neck stiffness.  Skin: Negative for rash.  Neurological: Negative for dizziness, syncope, speech difficulty, weakness, numbness and headaches.  Hematological: Negative for adenopathy. Does not bruise/bleed easily.  Psychiatric/Behavioral: Positive for confusion. Negative for behavioral problems and dysphoric mood. The patient is not nervous/anxious.        Objective:   Physical Exam  Constitutional: He appears well-developed and well-nourished.  HENT:  Head: Normocephalic and atraumatic.  Right Ear: External ear normal.  Left Ear: External ear normal.  Nose: Nose normal.  Mouth/Throat: Oropharynx is clear and moist.  Eyes: Pupils are equal, round, and reactive to light. Conjunctivae and EOM are normal. No scleral icterus.  Neck: Normal range of motion. Neck supple. No JVD present. No thyromegaly present.  Cardiovascular: Regular  rhythm, normal heart sounds and intact distal pulses.  Exam reveals no gallop and no friction rub.   No murmur heard. Pulmonary/Chest: Effort normal and breath sounds normal. He exhibits no tenderness.  Abdominal: Soft. Bowel sounds are normal. He exhibits no distension and no mass. There is no tenderness.  Genitourinary: Penis normal.  Musculoskeletal: Normal range of motion. He exhibits no edema or tenderness.  Lymphadenopathy:    He has no cervical adenopathy.  Neurological: He is alert. He has normal reflexes. No cranial nerve deficit. Coordination normal.  Skin: Skin is warm and dry. No rash noted.  Psychiatric: He has a normal mood and affect. His behavior is normal.          Assessment & Plan:   Social Medicare wellness visit Osteoarthritis Status post TUR invasive bladder tumor.  Follow-up urology History of right lower lobe pneumonia History of BPH  We'll review updated lab Close follow-up urology Flu vaccine administered  Follow-up here 6 months or as needed  Nyoka Cowden

## 2017-01-01 NOTE — Patient Instructions (Signed)
It is important that you exercise regularly, at least 20 minutes 3 to 4 times per week.  If you develop chest pain or shortness of breath seek  medical attention.  Urology follow-up as scheduled  Return here in 6 months for follow-up

## 2017-03-03 DIAGNOSIS — Z8551 Personal history of malignant neoplasm of bladder: Secondary | ICD-10-CM | POA: Diagnosis not present

## 2017-03-03 DIAGNOSIS — R8279 Other abnormal findings on microbiological examination of urine: Secondary | ICD-10-CM | POA: Diagnosis not present

## 2017-03-11 ENCOUNTER — Other Ambulatory Visit: Payer: Self-pay

## 2017-06-22 DIAGNOSIS — H2513 Age-related nuclear cataract, bilateral: Secondary | ICD-10-CM | POA: Diagnosis not present

## 2017-07-08 DIAGNOSIS — H2513 Age-related nuclear cataract, bilateral: Secondary | ICD-10-CM | POA: Diagnosis not present

## 2017-07-08 DIAGNOSIS — H52203 Unspecified astigmatism, bilateral: Secondary | ICD-10-CM | POA: Diagnosis not present

## 2017-08-17 DIAGNOSIS — R8279 Other abnormal findings on microbiological examination of urine: Secondary | ICD-10-CM | POA: Diagnosis not present

## 2017-08-17 DIAGNOSIS — Z8551 Personal history of malignant neoplasm of bladder: Secondary | ICD-10-CM | POA: Diagnosis not present

## 2017-09-04 HISTORY — PX: CATARACT EXTRACTION W/ INTRAOCULAR LENS  IMPLANT, BILATERAL: SHX1307

## 2017-09-07 DIAGNOSIS — H25812 Combined forms of age-related cataract, left eye: Secondary | ICD-10-CM | POA: Diagnosis not present

## 2017-09-07 DIAGNOSIS — H52202 Unspecified astigmatism, left eye: Secondary | ICD-10-CM | POA: Diagnosis not present

## 2017-09-07 DIAGNOSIS — H2512 Age-related nuclear cataract, left eye: Secondary | ICD-10-CM | POA: Diagnosis not present

## 2017-10-12 DIAGNOSIS — H2511 Age-related nuclear cataract, right eye: Secondary | ICD-10-CM | POA: Diagnosis not present

## 2017-10-12 DIAGNOSIS — H52201 Unspecified astigmatism, right eye: Secondary | ICD-10-CM | POA: Diagnosis not present

## 2017-10-12 DIAGNOSIS — H25811 Combined forms of age-related cataract, right eye: Secondary | ICD-10-CM | POA: Diagnosis not present

## 2017-10-12 DIAGNOSIS — H2181 Floppy iris syndrome: Secondary | ICD-10-CM | POA: Diagnosis not present

## 2017-10-19 DIAGNOSIS — L820 Inflamed seborrheic keratosis: Secondary | ICD-10-CM | POA: Diagnosis not present

## 2017-10-19 DIAGNOSIS — L570 Actinic keratosis: Secondary | ICD-10-CM | POA: Diagnosis not present

## 2017-10-19 DIAGNOSIS — L309 Dermatitis, unspecified: Secondary | ICD-10-CM | POA: Diagnosis not present

## 2017-11-19 DIAGNOSIS — H1013 Acute atopic conjunctivitis, bilateral: Secondary | ICD-10-CM | POA: Diagnosis not present

## 2018-01-06 ENCOUNTER — Encounter: Payer: Self-pay | Admitting: Adult Health

## 2018-01-06 ENCOUNTER — Other Ambulatory Visit: Payer: Self-pay | Admitting: Adult Health

## 2018-01-06 ENCOUNTER — Encounter: Payer: Medicare Other | Admitting: Internal Medicine

## 2018-01-06 ENCOUNTER — Ambulatory Visit (INDEPENDENT_AMBULATORY_CARE_PROVIDER_SITE_OTHER): Payer: Medicare Other | Admitting: Adult Health

## 2018-01-06 VITALS — BP 116/72 | HR 58 | Temp 97.7°F | Ht 66.0 in | Wt 135.6 lb

## 2018-01-06 DIAGNOSIS — R351 Nocturia: Secondary | ICD-10-CM | POA: Diagnosis not present

## 2018-01-06 DIAGNOSIS — Z23 Encounter for immunization: Secondary | ICD-10-CM | POA: Diagnosis not present

## 2018-01-06 DIAGNOSIS — K5901 Slow transit constipation: Secondary | ICD-10-CM | POA: Diagnosis not present

## 2018-01-06 DIAGNOSIS — N401 Enlarged prostate with lower urinary tract symptoms: Secondary | ICD-10-CM

## 2018-01-06 DIAGNOSIS — E871 Hypo-osmolality and hyponatremia: Secondary | ICD-10-CM

## 2018-01-06 DIAGNOSIS — D649 Anemia, unspecified: Secondary | ICD-10-CM | POA: Diagnosis not present

## 2018-01-06 DIAGNOSIS — E875 Hyperkalemia: Secondary | ICD-10-CM

## 2018-01-06 LAB — CBC WITH DIFFERENTIAL/PLATELET
BASOS ABS: 0.1 10*3/uL (ref 0.0–0.1)
BASOS PCT: 1.1 % (ref 0.0–3.0)
EOS ABS: 0.4 10*3/uL (ref 0.0–0.7)
Eosinophils Relative: 5.6 % — ABNORMAL HIGH (ref 0.0–5.0)
HCT: 36.6 % — ABNORMAL LOW (ref 39.0–52.0)
Hemoglobin: 12.1 g/dL — ABNORMAL LOW (ref 13.0–17.0)
LYMPHS ABS: 1.4 10*3/uL (ref 0.7–4.0)
Lymphocytes Relative: 19.7 % (ref 12.0–46.0)
MCHC: 33 g/dL (ref 30.0–36.0)
MCV: 91.8 fl (ref 78.0–100.0)
Monocytes Absolute: 0.4 10*3/uL (ref 0.1–1.0)
Monocytes Relative: 6.3 % (ref 3.0–12.0)
NEUTROS ABS: 4.6 10*3/uL (ref 1.4–7.7)
NEUTROS PCT: 67.3 % (ref 43.0–77.0)
PLATELETS: 261 10*3/uL (ref 150.0–400.0)
RBC: 3.99 Mil/uL — ABNORMAL LOW (ref 4.22–5.81)
RDW: 14.4 % (ref 11.5–15.5)
WBC: 6.9 10*3/uL (ref 4.0–10.5)

## 2018-01-06 LAB — BASIC METABOLIC PANEL
BUN: 26 mg/dL — AB (ref 6–23)
CALCIUM: 9.9 mg/dL (ref 8.4–10.5)
CHLORIDE: 104 meq/L (ref 96–112)
CO2: 30 mEq/L (ref 19–32)
CREATININE: 1.41 mg/dL (ref 0.40–1.50)
GFR: 50.61 mL/min — ABNORMAL LOW (ref >60.00)
Glucose, Bld: 91 mg/dL (ref 70–99)
Potassium: 5.9 mEq/L — ABNORMAL HIGH (ref 3.5–5.1)
SODIUM: 138 meq/L (ref 135–145)

## 2018-01-06 LAB — HEPATIC FUNCTION PANEL
ALK PHOS: 69 U/L (ref 39–117)
ALT: 12 U/L (ref 0–53)
AST: 21 U/L (ref 0–37)
Albumin: 4.1 g/dL (ref 3.5–5.2)
BILIRUBIN DIRECT: 0.1 mg/dL (ref 0.0–0.3)
Total Bilirubin: 0.6 mg/dL (ref 0.2–1.2)
Total Protein: 6.9 g/dL (ref 6.0–8.3)

## 2018-01-06 LAB — TSH: TSH: 4.81 u[IU]/mL — ABNORMAL HIGH (ref 0.35–4.50)

## 2018-01-06 NOTE — Patient Instructions (Signed)
It was great seeing you today   I will follow up with you regarding your blood work   Follow up in one year for your next physical and sooner if needed  Continue to stay active and eat a heart healthy diet

## 2018-01-06 NOTE — Progress Notes (Signed)
Patient presents to clinic today to establish care. He is a pleasant and remarkable  84 year old male who  has a past medical history of Bladder cancer ([place]) (urology-- dr Karsten Ro), BPH with urinary obstruction, Dry skin dermatitis, Elevated PSA, History of colon polyps, Hydronephrosis, left, Lower urinary tract symptoms (LUTS), OA (osteoarthritis), Prostate nodule, Seasonal allergic rhinitis, and Wears glasses.  He is a patient of Dr. Raliegh Ip who is transferring care to me   Acute Concerns: Establish Care/CPE   Chronic Issues:  Bladder Cancer - s/p TUR bladder tumor in Dec 2016 for an invasive papillary cancer. He is followed by Urology in six month intervals. Next visit 02/17/2018   IBS-C - Uses Linzess. Has no issues currently.   BPH with LUTS - is managed by Urology. Does not think he is currently taking any medications.   Osteoarthritis - cervical spine. Does not take any medications. " It is manageable".   Health Maintenance: Dental -- Routine  Vision -- Routine Care  Immunizations -- Needs flu shot  Colonoscopy -- No longer needs  Treatment  - Dr. Ivette Loyal - Urology   Past Medical History:  Diagnosis Date  . Bladder cancer [facility]) urology-- dr Karsten Ro   dx 11/ 2016  Transitional cell carcinoma, High grade invasive , Stage T1  G3 ---  s/p  TURBT and Mitomycin C instillation  . BPH with urinary obstruction   . Dry skin dermatitis   . Elevated PSA   . History of colon polyps   . Hydronephrosis, left   . Lower urinary tract symptoms (LUTS)   . OA (osteoarthritis)   . Prostate nodule   . Seasonal allergic rhinitis   . Wears glasses     Past Surgical History:  Procedure Laterality Date  . cataract surgery    . CYSTOSCOPY WITH BIOPSY N/A 04/22/2015   Procedure: CYSTOSCOPY WITH BLADDER BIOPSY;  Surgeon: Kathie Rhodes, MD;  Location: [facility];  Service: Urology;  Laterality: N/A;  . CYSTOSCOPY WITH RETROGRADE PYELOGRAM, URETEROSCOPY AND STENT PLACEMENT Left  02/25/2015   Procedure: LEFT RETROGRADE PYELOGRAM, URETEROSCOPY ;  Surgeon: Kathie Rhodes, MD;  Location: [facility];  Service: Urology;  Laterality: Left;  . HERNIA REPAIR  age [id number]   rupture right inguinal hernia  . INGUINAL HERNIA REPAIR  03/02/2011   Procedure: HERNIA REPAIR INGUINAL ADULT;  Surgeon: Earnstine Regal, MD;  Location: [facility];  Service: General;  Laterality: Left;  with mesh   . TRANSURETHRAL RESECTION OF BLADDER TUMOR WITH GYRUS (TURBT-GYRUS) N/A 02/25/2015   Procedure: TRANSURETHRAL RESECTION OF BLADDER TUMOR WITH GYRUS (TURBT-GYRUS);  Surgeon: Kathie Rhodes, MD;  Location: [facility];  Service: Urology;  Laterality: N/A;    Current Outpatient Medications on File Prior to Visit  Medication Sig Dispense Refill  . ALOE VERA JUICE LIQD Take 30 mLs by mouth every morning.    [name] aspirin EC 81 MG tablet Take 81 mg by mouth at bedtime.     . Black Pepper-Turmeric (TURMERIC COMPLEX/BLACK PEPPER PO) Take 1 capsule by mouth daily.    . cetirizine (ZYRTEC) 10 MG tablet Take 10 mg by mouth daily as needed for allergies.    . naphazoline-pheniramine (NAPHCON-A) 0.025-0.3 % ophthalmic solution Place 1 drop into both eyes 2 (two) times daily.    . protein supplement shake (PREMIER PROTEIN) LIQD Take 2 oz by mouth daily. Add to PET butter pecan ice cream for calorie/protein supplement.    . triamcinolone ointment (  KENALOG) 0.1 % Apply 1 application topically 2 (two) times daily.     . TRULANCE 3 MG TABS Take 3 mg by mouth every morning.     . Wheat Dextrin (BENEFIBER DRINK MIX PO) Take 5 mLs by mouth 2 (two) times daily.      No current facility-administered medications on file prior to visit.     Allergies  Allergen Reactions  . Amoxicillin Rash    Has patient had a PCN reaction causing immediate rash, facial/tongue/throat swelling, SOB or lightheadedness with hypotension:No Has patient had a PCN reaction causing severe rash involving mucus membranes or  skin necrosis:unsure Has patient had a PCN reaction that required hospitalization:No Has patient had a PCN reaction occurring within the last 10 years:unsure If all of the above answers are "NO", then may proceed with Cephalosporin use.   . Ciprofloxacin Swelling and Rash  . Sulfasalazine Swelling  . Sulfa Antibiotics Swelling  . Doxycycline Hyclate Rash  . Levofloxacin Rash    Family History  Problem Relation Age of Onset  . Heart disease Mother   . Heart disease Father   . Cancer Sister        breast  . Cancer Brother        colon    Social History   Socioeconomic History  . Marital status: Married    Spouse name: Not on file  . Number of children: Not on file  . Years of education: Not on file  . Highest education level: Not on file  Occupational History  . Not on file  Social Needs  . Financial resource strain: Not on file  . Food insecurity:    Worry: Not on file    Inability: Not on file  . Transportation needs:    Medical: Not on file    Non-medical: Not on file  Tobacco Use  . Smoking status: Former Smoker    Packs/day: 1.50    Years: 34.00    Pack years: 51.00    Types: Cigarettes    Last attempt to quit: 02/17/1979    Years since quitting: 38.9  . Smokeless tobacco: Never Used  Substance and Sexual Activity  . Alcohol use: Yes    Comment: occasional  . Drug use: No  . Sexual activity: Not on file  Lifestyle  . Physical activity:    Days per week: Not on file    Minutes per session: Not on file  . Stress: Not on file  Relationships  . Social connections:    Talks on phone: Not on file    Gets together: Not on file    Attends religious service: Not on file    Active member of club or organization: Not on file    Attends meetings of clubs or organizations: Not on file    Relationship status: Not on file  . Intimate partner violence:    Fear of current or ex partner: Not on file    Emotionally abused: Not on file    Physically abused: Not on  file    Forced sexual activity: Not on file  Other Topics Concern  . Not on file  Social History Narrative  . Not on file    Review of Systems  Constitutional: Negative.   HENT: Negative.   Eyes: Negative.   Respiratory: Negative.   Cardiovascular: Negative.   Gastrointestinal: Negative.   Genitourinary: Negative.        Nocturia    Musculoskeletal: Positive for back pain (cervical neck ).  Skin: Negative.   Neurological: Negative.   Endo/Heme/Allergies: Negative.   Psychiatric/Behavioral: Negative.   All other systems reviewed and are negative.     BP 116/72 (BP Location: Left Arm, Patient Position: Sitting, Cuff Size: Normal)   Pulse (!) 58   Temp 97.7 F (36.5 C) (Oral)   Ht 5\' 6"  (1.676 m)   Wt 135 lb 9.6 oz (61.5 kg)   SpO2 94%   BMI 21.89 kg/m   Physical Exam  Constitutional: He is oriented to person, place, and time. He appears well-developed and well-nourished. No distress.  HENT:  Head: Normocephalic and atraumatic.  Right Ear: External ear normal.  Left Ear: External ear normal.  Nose: Nose normal.  Mouth/Throat: Oropharynx is clear and moist. No oropharyngeal exudate.  Eyes: Pupils are equal, round, and reactive to light. Conjunctivae and EOM are normal. Right eye exhibits no discharge. Left eye exhibits no discharge. No scleral icterus.  Neck: Normal range of motion. Neck supple. No JVD present. No tracheal deviation present. No thyromegaly present.  Cardiovascular: Normal rate, regular rhythm, normal heart sounds and intact distal pulses. Exam reveals no gallop and no friction rub.  No murmur heard. Pulmonary/Chest: Effort normal and breath sounds normal. No stridor. No respiratory distress. He has no wheezes. He has no rales. He exhibits no tenderness.  Abdominal: Soft. Bowel sounds are normal. He exhibits no distension and no mass. There is no tenderness. There is no rebound and no guarding. No hernia.  Musculoskeletal: Normal range of motion. He  exhibits no edema, tenderness or deformity.  Lymphadenopathy:    He has no cervical adenopathy.  Neurological: He is alert and oriented to person, place, and time. He displays normal reflexes. No cranial nerve deficit or sensory deficit. He exhibits normal muscle tone. Coordination normal.  Skin: Skin is warm and dry. Capillary refill takes less than 2 seconds. No rash noted. He is not diaphoretic. No erythema. No pallor.  Psychiatric: He has a normal mood and affect. His behavior is normal. Judgment and thought content normal.  Nursing note and vitals reviewed.  Assessment/Plan: 1. Slow transit constipation - Controlled with Linzess  2. Anemia, unspecified type  - Basic metabolic panel - CBC with Differential/Platelet - Hepatic function panel - TSH  3. Benign prostatic hyperplasia with nocturia - He is going to follow up with urology   4. Encounter for immunization - Flu vaccine HIGH DOSE PF  Dorothyann Peng, NP

## 2018-01-13 ENCOUNTER — Other Ambulatory Visit (INDEPENDENT_AMBULATORY_CARE_PROVIDER_SITE_OTHER): Payer: Medicare Other

## 2018-01-13 DIAGNOSIS — E875 Hyperkalemia: Secondary | ICD-10-CM | POA: Diagnosis not present

## 2018-01-13 LAB — POTASSIUM: POTASSIUM: 5 meq/L (ref 3.5–5.1)

## 2018-02-21 ENCOUNTER — Other Ambulatory Visit: Payer: Self-pay

## 2018-02-22 DIAGNOSIS — Z8551 Personal history of malignant neoplasm of bladder: Secondary | ICD-10-CM | POA: Diagnosis not present

## 2018-02-22 DIAGNOSIS — R3915 Urgency of urination: Secondary | ICD-10-CM | POA: Diagnosis not present

## 2018-02-22 DIAGNOSIS — R8279 Other abnormal findings on microbiological examination of urine: Secondary | ICD-10-CM | POA: Diagnosis not present

## 2018-03-10 ENCOUNTER — Ambulatory Visit (INDEPENDENT_AMBULATORY_CARE_PROVIDER_SITE_OTHER): Payer: Medicare Other | Admitting: Internal Medicine

## 2018-03-10 ENCOUNTER — Encounter: Payer: Self-pay | Admitting: Internal Medicine

## 2018-03-10 VITALS — BP 110/70 | HR 80 | Temp 97.5°F | Wt 145.5 lb

## 2018-03-10 DIAGNOSIS — N401 Enlarged prostate with lower urinary tract symptoms: Secondary | ICD-10-CM

## 2018-03-10 DIAGNOSIS — R351 Nocturia: Secondary | ICD-10-CM

## 2018-03-10 DIAGNOSIS — C679 Malignant neoplasm of bladder, unspecified: Secondary | ICD-10-CM | POA: Diagnosis not present

## 2018-03-10 DIAGNOSIS — K5901 Slow transit constipation: Secondary | ICD-10-CM | POA: Diagnosis not present

## 2018-03-10 DIAGNOSIS — Z23 Encounter for immunization: Secondary | ICD-10-CM | POA: Diagnosis not present

## 2018-03-10 NOTE — Progress Notes (Signed)
Established Patient Office Visit     CC/Reason for Visit: To establish care and follow-up on chronic medical conditions  HPI: Benjamin Cardenas is a 84 y.o. male who is coming in today for the above mentioned reasons.  His annual physical exam is due in October 2020.  Past Medical History is significant for: Bladder cancer followed by Dr. Karsten Ro that has been followed biannually and appears to be stable, BPH, from which he is very symptomatic with frequency, dribbling, frequent nocturia as well as constipation that has been well controlled on Trulance.  He tells me that he recently got a letter in the mail about his insurance no longer covering this medication and he has follow-up with his GI physician, Dr. Earlean Shawl, scheduled for next week to discuss this.  He is a remarkably healthy and physically active 84 year old man with really not much in the way of complaints today.   Past Medical/Surgical History: Past Medical History:  Diagnosis Date  . Bladder cancer [facility]) urology-- dr Karsten Ro   dx 11/ 2016  Transitional cell carcinoma, High grade invasive , Stage T1  G3 ---  s/p  TURBT and Mitomycin C instillation  . BPH with urinary obstruction   . Dry skin dermatitis   . Elevated PSA   . History of colon polyps   . Hydronephrosis, left   . Lower urinary tract symptoms (LUTS)   . OA (osteoarthritis)   . Prostate nodule   . Seasonal allergic rhinitis   . Wears glasses     Past Surgical History:  Procedure Laterality Date  . cataract surgery    . CYSTOSCOPY WITH BIOPSY N/A 04/22/2015   Procedure: CYSTOSCOPY WITH BLADDER BIOPSY;  Surgeon: Kathie Rhodes, MD;  Location: [facility];  Service: Urology;  Laterality: N/A;  . CYSTOSCOPY WITH RETROGRADE PYELOGRAM, URETEROSCOPY AND STENT PLACEMENT Left 02/25/2015   Procedure: LEFT RETROGRADE PYELOGRAM, URETEROSCOPY ;  Surgeon: Kathie Rhodes, MD;  Location: [facility];  Service: Urology;  Laterality: Left;  .  HERNIA REPAIR  age 62 1937   rupture right inguinal hernia  . INGUINAL HERNIA REPAIR  03/02/2011   Procedure: HERNIA REPAIR INGUINAL ADULT;  Surgeon: Earnstine Regal, MD;  Location: [facility];  Service: General;  Laterality: Left;  with mesh   . TRANSURETHRAL RESECTION OF BLADDER TUMOR WITH GYRUS (TURBT-GYRUS) N/A 02/25/2015   Procedure: TRANSURETHRAL RESECTION OF BLADDER TUMOR WITH GYRUS (TURBT-GYRUS);  Surgeon: Kathie Rhodes, MD;  Location: [facility];  Service: Urology;  Laterality: N/A;    Social History:  reports that he quit smoking about 39 years ago. His smoking use included cigarettes. He has a 51.00 pack-year smoking history. He has never used smokeless tobacco. He reports that he drinks alcohol. He reports that he does not use drugs.  Allergies: Allergies  Allergen Reactions  . Amoxicillin Rash    Has patient had a PCN reaction causing immediate rash, facial/tongue/throat swelling, SOB or lightheadedness with hypotension:No Has patient had a PCN reaction causing severe rash involving mucus membranes or skin necrosis:unsure Has patient had a PCN reaction that required hospitalization:No Has patient had a PCN reaction occurring within the last 10 years:unsure If all of the above answers are "NO", then may proceed with Cephalosporin use.   . Ciprofloxacin Swelling and Rash  . Sulfasalazine Swelling  . Sulfa Antibiotics Swelling  . Doxycycline Hyclate Rash  . Levofloxacin Rash    Family History:  Family History  Problem Relation Age of Onset  .  Heart disease Mother   . Heart disease Father   . Cancer Sister        breast  . Cancer Brother        colon     Current Outpatient Medications:  .  ALOE VERA JUICE LIQD, Take 30 mLs by mouth every morning., Disp: , Rfl:  .  aspirin EC 81 MG tablet, Take 81 mg by mouth at bedtime. , Disp: , Rfl:  .  Black Pepper-Turmeric (TURMERIC COMPLEX/BLACK PEPPER PO), Take 1 capsule by mouth daily., Disp: , Rfl:  .  cetirizine  (ZYRTEC) 10 MG tablet, Take 10 mg by mouth daily as needed for allergies., Disp: , Rfl:  .  naphazoline-pheniramine (NAPHCON-A) 0.025-0.3 % ophthalmic solution, Place 1 drop into both eyes 2 (two) times daily., Disp: , Rfl:  .  protein supplement shake (PREMIER PROTEIN) LIQD, Take 2 oz by mouth daily. Add to PET butter pecan ice cream for calorie/protein supplement., Disp: , Rfl:  .  triamcinolone ointment (KENALOG) 0.1 %, Apply 1 application topically 2 (two) times daily. , Disp: , Rfl:  .  TRULANCE 3 MG TABS, Take 3 mg by mouth every morning. , Disp: , Rfl:  .  Wheat Dextrin (BENEFIBER DRINK MIX PO), Take 5 mLs by mouth 2 (two) times daily. , Disp: , Rfl:   Review of Systems:  Constitutional: Denies fever, chills, diaphoresis, appetite change and fatigue.  HEENT: Denies photophobia, eye pain, redness, hearing loss, ear pain, congestion, sore throat, rhinorrhea, sneezing, mouth sores, trouble swallowing, neck pain, neck stiffness and tinnitus.   Respiratory: Denies SOB, DOE, cough, chest tightness,  and wheezing.   Cardiovascular: Denies chest pain, palpitations and leg swelling.  Gastrointestinal: Denies nausea, vomiting, abdominal pain, diarrhea, constipation, blood in stool and abdominal distention.  Genitourinary: Denies dysuria, urgency, frequency, hematuria, flank pain and difficulty urinating.  Endocrine: Denies: hot or cold intolerance, sweats, changes in hair or nails, polyuria, polydipsia. Musculoskeletal: Denies myalgias, back pain, joint swelling, arthralgias and gait problem.  Skin: Denies pallor, rash and wound.  Neurological: Denies dizziness, seizures, syncope, weakness, light-headedness, numbness and headaches.  Hematological: Denies adenopathy. Easy bruising, personal or family bleeding history  Psychiatric/Behavioral: Denies suicidal ideation, mood changes, confusion, nervousness, sleep disturbance and agitation    Physical Exam: Vitals:   03/10/18 1451  BP: 110/70    Pulse: 80  Temp: (!) 97.5 F (36.4 C)  TempSrc: Oral  SpO2: 97%  Weight: 145 lb 8 oz (66 kg)    Body mass index is 23.48 kg/m.   Constitutional: NAD, calm, comfortable Eyes: PERRL, lids and conjunctivae normal ENMT: Mucous membranes are moist. Posterior pharynx clear of any exudate or lesions. Normal dentition.  Neck: normal, supple, no masses, no thyromegaly Respiratory: clear to auscultation bilaterally, no wheezing, no crackles. Normal respiratory effort. No accessory muscle use.  Cardiovascular: Regular rate and rhythm, no murmurs / rubs / gallops. No extremity edema. 2+ pedal pulses. No carotid bruits.  Abdomen: no tenderness, no masses palpated. No hepatosplenomegaly. Bowel sounds positive.  Musculoskeletal: no clubbing / cyanosis. No joint deformity upper and lower extremities. Good ROM, no contractures. Normal muscle tone.  Neurologic: Grossly intact and nonfocal Psychiatric: Normal judgment and insight. Alert and oriented x 3. Normal mood.    Impression and Plan:  Benign prostatic hyperplasia with nocturia -Very symptomatic. -At his last visit with Dr. Karsten Ro he was given a prescription for Myrbetriq but he has not taken. -Since he is already established with urology, have advised him and his  wife to follow-up with him in regards to his BPH.  Slow transit constipation -He has been on Trulance for many years and does well on this.  Is concerned that his insurance will no longer cover but will follow-up with GI on this issue next week.  Malignant neoplasm of urinary bladder, unspecified site [facility]) -Followed by urology on a biannual basis.  He is requesting shingles vaccination today.  Will administer first in series.  He is also due for tetanus booster but is reluctant to receive 2 vaccinations today, will return in few weeks to have this done.    Patient Instructions  -It was nice meeting you today!  -First shingles vaccination today.  -Return to clinic in  few weeks for tetanus booster.  -Please schedule follow-up with me in October 2020 for your annual physical exam, please come fasting to that visit.  Please see [place] sooner if you have any acute concerns before then.     Lelon Frohlich, MD [place] [facility]

## 2018-03-10 NOTE — Addendum Note (Signed)
Addended by: Westley Hummer B on: 03/10/2018 03:56 PM   Modules accepted: Orders

## 2018-03-10 NOTE — Patient Instructions (Signed)
-  It was nice meeting you today!  -First shingles vaccination today.  -Return to clinic in few weeks for tetanus booster.  -Please schedule follow-up with me in October 2020 for your annual physical exam, please come fasting to that visit.  Please see [place] sooner if you have any acute concerns before then.

## 2018-03-14 DIAGNOSIS — K5909 Other constipation: Secondary | ICD-10-CM | POA: Diagnosis not present

## 2018-03-24 ENCOUNTER — Ambulatory Visit (INDEPENDENT_AMBULATORY_CARE_PROVIDER_SITE_OTHER): Payer: Medicare Other | Admitting: Family Medicine

## 2018-03-24 DIAGNOSIS — Z23 Encounter for immunization: Secondary | ICD-10-CM | POA: Diagnosis not present

## 2018-03-24 NOTE — Progress Notes (Signed)
After obtaining consent, and per orders of Dr. Jerilee Hoh. An injection of Td was given by Miles Costain. Patient tolerated injection well.

## 2018-05-02 DIAGNOSIS — R8279 Other abnormal findings on microbiological examination of urine: Secondary | ICD-10-CM | POA: Diagnosis not present

## 2018-05-02 DIAGNOSIS — N401 Enlarged prostate with lower urinary tract symptoms: Secondary | ICD-10-CM | POA: Diagnosis not present

## 2018-05-02 DIAGNOSIS — R3915 Urgency of urination: Secondary | ICD-10-CM | POA: Diagnosis not present

## 2018-05-02 DIAGNOSIS — M545 Low back pain: Secondary | ICD-10-CM | POA: Diagnosis not present

## 2018-05-10 ENCOUNTER — Ambulatory Visit (INDEPENDENT_AMBULATORY_CARE_PROVIDER_SITE_OTHER): Payer: Medicare Other | Admitting: Internal Medicine

## 2018-05-10 ENCOUNTER — Encounter: Payer: Self-pay | Admitting: Internal Medicine

## 2018-05-10 VITALS — BP 120/74 | HR 82 | Temp 97.9°F | Wt 140.9 lb

## 2018-05-10 DIAGNOSIS — M5431 Sciatica, right side: Secondary | ICD-10-CM

## 2018-05-10 NOTE — Patient Instructions (Signed)
-It was nice seeing you today and I hope you feel better soon!  -Try ice on your buttocks and may use ibuprofen up to twice a day for 7 days.  -Call if no improvement over next 10-14 days.   Sciatica  Sciatica is pain, numbness, weakness, or tingling along your sciatic nerve. The sciatic nerve starts in the lower back and goes down the back of each leg. Sciatica happens when this nerve is pinched or has pressure put on it. Sciatica usually goes away on its own or with treatment. Sometimes, sciatica may keep coming back (recur). Follow these instructions at home: Medicines  Take over-the-counter and prescription medicines only as told by your doctor.  Do not drive or use heavy machinery while taking prescription pain medicine. Managing pain  If directed, put ice on the affected area. ? Put ice in a plastic bag. ? Place a towel between your skin and the bag. ? Leave the ice on for 20 minutes, 2-3 times a day.  After icing, apply heat to the affected area before you exercise or as often as told by your doctor. Use the heat source that your doctor tells you to use, such as a moist heat pack or a heating pad. ? Place a towel between your skin and the heat source. ? Leave the heat on for 20-30 minutes. ? Remove the heat if your skin turns bright red. This is especially important if you are unable to feel pain, heat, or cold. You may have a greater risk of getting burned. Activity  Return to your normal activities as told by your doctor. Ask your doctor what activities are safe for you. ? Avoid activities that make your sciatica worse.  Take short rests during the day. Rest in a lying or standing position. This is usually better than sitting to rest. ? When you rest for a long time, do some physical activity or stretching between periods of rest. ? Avoid sitting for a long time without moving. Get up and move around at least one time each hour.  Exercise and stretch regularly, as told by  your doctor.  Do not lift anything that is heavier than 10 lb (4.5 kg) while you have symptoms of sciatica. ? Avoid lifting heavy things even when you do not have symptoms. ? Avoid lifting heavy things over and over.  When you lift objects, always lift in a way that is safe for your body. To do this, you should: ? Bend your knees. ? Keep the object close to your body. ? Avoid twisting. General instructions  Use good posture. ? Avoid leaning forward when you are sitting. ? Avoid hunching over when you are standing.  Stay at a healthy weight.  Wear comfortable shoes that support your feet. Avoid wearing high heels.  Avoid sleeping on a mattress that is too soft or too hard. You might have less pain if you sleep on a mattress that is firm enough to support your back.  Keep all follow-up visits as told by your doctor. This is important. Contact a doctor if:  You have pain that: ? Wakes you up when you are sleeping. ? Gets worse when you lie down. ? Is worse than the pain you have had in the past. ? Lasts longer than 4 weeks.  You lose weight for without trying. Get help right away if:  You cannot control when you pee (urinate) or poop (have a bowel movement).  You have weakness in any of  these areas and it gets worse. ? Lower back. ? Lower belly (pelvis). ? Butt (buttocks). ? Legs.  You have redness or swelling of your back.  You have a burning feeling when you pee. This information is not intended to replace advice given to you by your health care provider. Make sure you discuss any questions you have with your health care provider. Document Released: 12/31/2007 Document Revised: 08/29/2015 Document Reviewed: 11/30/2014 Elsevier Interactive Patient Education  2019 [employer].

## 2018-05-10 NOTE — Progress Notes (Signed)
Established Patient Office Visit     CC/Reason for Visit: right buttock and leg pain for 2 weeks  HPI: Benjamin Cardenas is a 85 y.o. male who is coming in today for the above mentioned reasons. Past Medical History is significant for: Bladder cancer followed by Dr. Karsten Ro that has been followed biannually and appears to be stable, BPH, from which he is very symptomatic with frequency, dribbling, frequent nocturia as well as constipation that has been well controlled on Trulance.   He is here today because of right buttock, groin and leg pain that began 2 weeks ago. He goes to the gym routinely and does upper body workouts and walks at a fast pace on the treadmill.. 4 days after his last workout he started having this pain. He saw his GU first given his h/o hernias and bladder cancer. GU said he did not have a hernia and was referred back here. No fever, dysuria.  Past Medical/Surgical History: Past Medical History:  Diagnosis Date  . Bladder cancer [facility]) urology-- dr Karsten Ro   dx 11/ 2016  Transitional cell carcinoma, High grade invasive , Stage T1  G3 ---  s/p  TURBT and Mitomycin C instillation  . BPH with urinary obstruction   . Dry skin dermatitis   . Elevated PSA   . History of colon polyps   . Hydronephrosis, left   . Lower urinary tract symptoms (LUTS)   . OA (osteoarthritis)   . Prostate nodule   . Seasonal allergic rhinitis   . Wears glasses     Past Surgical History:  Procedure Laterality Date  . cataract surgery    . CYSTOSCOPY WITH BIOPSY N/A 04/22/2015   Procedure: CYSTOSCOPY WITH BLADDER BIOPSY;  Surgeon: Kathie Rhodes, MD;  Location: [facility];  Service: Urology;  Laterality: N/A;  . CYSTOSCOPY WITH RETROGRADE PYELOGRAM, URETEROSCOPY AND STENT PLACEMENT Left 02/25/2015   Procedure: LEFT RETROGRADE PYELOGRAM, URETEROSCOPY ;  Surgeon: Kathie Rhodes, MD;  Location: [facility];  Service: Urology;  Laterality: Left;  . HERNIA REPAIR   age 55 1937   rupture right inguinal hernia  . INGUINAL HERNIA REPAIR  03/02/2011   Procedure: HERNIA REPAIR INGUINAL ADULT;  Surgeon: Earnstine Regal, MD;  Location: [facility];  Service: General;  Laterality: Left;  with mesh   . TRANSURETHRAL RESECTION OF BLADDER TUMOR WITH GYRUS (TURBT-GYRUS) N/A 02/25/2015   Procedure: TRANSURETHRAL RESECTION OF BLADDER TUMOR WITH GYRUS (TURBT-GYRUS);  Surgeon: Kathie Rhodes, MD;  Location: [facility];  Service: Urology;  Laterality: N/A;    Social History:  reports that he quit smoking about 39 years ago. His smoking use included cigarettes. He has a 51.00 pack-year smoking history. He has never used smokeless tobacco. He reports current alcohol use. He reports that he does not use drugs.  Allergies: Allergies  Allergen Reactions  . Amoxicillin Rash    Has patient had a PCN reaction causing immediate rash, facial/tongue/throat swelling, SOB or lightheadedness with hypotension:No Has patient had a PCN reaction causing severe rash involving mucus membranes or skin necrosis:unsure Has patient had a PCN reaction that required hospitalization:No Has patient had a PCN reaction occurring within the last 10 years:unsure If all of the above answers are "NO", then may proceed with Cephalosporin use.   . Ciprofloxacin Swelling and Rash  . Sulfasalazine Swelling  . Sulfa Antibiotics Swelling  . Doxycycline Hyclate Rash  . Levofloxacin Rash    Family History:  Family History  Problem  Relation Age of Onset  . Heart disease Mother   . Heart disease Father   . Cancer Sister        breast  . Cancer Brother        colon     Current Outpatient Medications:  .  ALOE VERA JUICE LIQD, Take 30 mLs by mouth every morning., Disp: , Rfl:  .  aspirin EC 81 MG tablet, Take 81 mg by mouth at bedtime. , Disp: , Rfl:  .  Black Pepper-Turmeric (TURMERIC COMPLEX/BLACK PEPPER PO), Take 1 capsule by mouth daily., Disp: , Rfl:  .  cetirizine (ZYRTEC) 10 MG  tablet, Take 10 mg by mouth daily as needed for allergies., Disp: , Rfl:  .  naphazoline-pheniramine (NAPHCON-A) 0.025-0.3 % ophthalmic solution, Place 1 drop into both eyes 2 (two) times daily., Disp: , Rfl:  .  protein supplement shake (PREMIER PROTEIN) LIQD, Take 2 oz by mouth daily. Add to PET butter pecan ice cream for calorie/protein supplement., Disp: , Rfl:  .  triamcinolone ointment (KENALOG) 0.1 %, Apply 1 application topically 2 (two) times daily. , Disp: , Rfl:  .  TRULANCE 3 MG TABS, Take 3 mg by mouth every morning. , Disp: , Rfl:  .  Wheat Dextrin (BENEFIBER DRINK MIX PO), Take 5 mLs by mouth 2 (two) times daily. , Disp: , Rfl:   Review of Systems:  Constitutional: Denies fever, chills, diaphoresis, appetite change and fatigue.  HEENT: Denies photophobia, eye pain, redness, hearing loss, ear pain, congestion, sore throat, rhinorrhea, sneezing, mouth sores, trouble swallowing, neck pain, neck stiffness and tinnitus.   Respiratory: Denies SOB, DOE, cough, chest tightness,  and wheezing.   Cardiovascular: Denies chest pain, palpitations and leg swelling.  Gastrointestinal: Denies nausea, vomiting, abdominal pain, diarrhea, constipation, blood in stool and abdominal distention.  Genitourinary: Denies dysuria, urgency, frequency, hematuria, flank pain and difficulty urinating.     Physical Exam: Vitals:   05/10/18 1028  BP: 120/74  Pulse: 82  Temp: 97.9 F (36.6 C)  TempSrc: Oral  SpO2: 97%  Weight: 140 lb 14.4 oz (63.9 kg)    Body mass index is 22.74 kg/m.   Constitutional: NAD, calm, comfortable Eyes: PERRL, lids and conjunctivae normal ENMT: Mucous membranes are moist.  Musculoskeletal: no clubbing / cyanosis. No joint deformity upper and lower extremities. Good ROM, no contractures. Normal muscle tone.  No hernias palpated, negative SLR. Skin: no rashes, lesions, ulcers. No induration Neurologic: CN 2-12 grossly intact. Sensation intact, DTR normal. Strength 5/5 in  all 4.  Psychiatric: Normal judgment and insight. Alert and oriented x 3. Normal mood.    Impression and Plan:  Sciatica of right side -This is the likely explanation. -Have advised local therapy: ice, massage, ibuprofen. -Have also provided with some back exercises. -If no improvement could consider some muscle relaxers and referral to PT. -Advised to RTC if no improvement over the next 2 weeks. -1 week break from gym and then may return at lower intensity.   Patient Instructions  -It was nice seeing you today and I hope you feel better soon!  -Try ice on your buttocks and may use ibuprofen up to twice a day for 7 days.  -Call if no improvement over next 10-14 days.   Sciatica  Sciatica is pain, numbness, weakness, or tingling along your sciatic nerve. The sciatic nerve starts in the lower back and goes down the back of each leg. Sciatica happens when this nerve is pinched or has pressure put on  it. Sciatica usually goes away on its own or with treatment. Sometimes, sciatica may keep coming back (recur). Follow these instructions at home: Medicines  Take over-the-counter and prescription medicines only as told by your doctor.  Do not drive or use heavy machinery while taking prescription pain medicine. Managing pain  If directed, put ice on the affected area. ? Put ice in a plastic bag. ? Place a towel between your skin and the bag. ? Leave the ice on for 20 minutes, 2-3 times a day.  After icing, apply heat to the affected area before you exercise or as often as told by your doctor. Use the heat source that your doctor tells you to use, such as a moist heat pack or a heating pad. ? Place a towel between your skin and the heat source. ? Leave the heat on for 20-30 minutes. ? Remove the heat if your skin turns bright red. This is especially important if you are unable to feel pain, heat, or cold. You may have a greater risk of getting burned. Activity  Return to your  normal activities as told by your doctor. Ask your doctor what activities are safe for you. ? Avoid activities that make your sciatica worse.  Take short rests during the day. Rest in a lying or standing position. This is usually better than sitting to rest. ? When you rest for a long time, do some physical activity or stretching between periods of rest. ? Avoid sitting for a long time without moving. Get up and move around at least one time each hour.  Exercise and stretch regularly, as told by your doctor.  Do not lift anything that is heavier than 10 lb (4.5 kg) while you have symptoms of sciatica. ? Avoid lifting heavy things even when you do not have symptoms. ? Avoid lifting heavy things over and over.  When you lift objects, always lift in a way that is safe for your body. To do this, you should: ? Bend your knees. ? Keep the object close to your body. ? Avoid twisting. General instructions  Use good posture. ? Avoid leaning forward when you are sitting. ? Avoid hunching over when you are standing.  Stay at a healthy weight.  Wear comfortable shoes that support your feet. Avoid wearing high heels.  Avoid sleeping on a mattress that is too soft or too hard. You might have less pain if you sleep on a mattress that is firm enough to support your back.  Keep all follow-up visits as told by your doctor. This is important. Contact a doctor if:  You have pain that: ? Wakes you up when you are sleeping. ? Gets worse when you lie down. ? Is worse than the pain you have had in the past. ? Lasts longer than 4 weeks.  You lose weight for without trying. Get help right away if:  You cannot control when you pee (urinate) or poop (have a bowel movement).  You have weakness in any of these areas and it gets worse. ? Lower back. ? Lower belly (pelvis). ? Butt (buttocks). ? Legs.  You have redness or swelling of your back.  You have a burning feeling when you pee. This  information is not intended to replace advice given to you by your health care provider. Make sure you discuss any questions you have with your health care provider. Document Released: 12/31/2007 Document Revised: 08/29/2015 Document Reviewed: 11/30/2014 Elsevier Interactive Patient Education  2019 [employer].  Estela Hernandez Acosta, MD [facility]   

## 2018-05-16 ENCOUNTER — Ambulatory Visit (INDEPENDENT_AMBULATORY_CARE_PROVIDER_SITE_OTHER): Payer: Medicare Other | Admitting: *Deleted

## 2018-05-16 DIAGNOSIS — Z23 Encounter for immunization: Secondary | ICD-10-CM

## 2018-05-24 DIAGNOSIS — H1013 Acute atopic conjunctivitis, bilateral: Secondary | ICD-10-CM | POA: Diagnosis not present

## 2018-05-24 DIAGNOSIS — H26493 Other secondary cataract, bilateral: Secondary | ICD-10-CM | POA: Diagnosis not present

## 2018-05-30 ENCOUNTER — Telehealth: Payer: Self-pay | Admitting: Internal Medicine

## 2018-05-30 ENCOUNTER — Ambulatory Visit (INDEPENDENT_AMBULATORY_CARE_PROVIDER_SITE_OTHER): Payer: Medicare Other | Admitting: Podiatry

## 2018-05-30 ENCOUNTER — Encounter: Payer: Self-pay | Admitting: Podiatry

## 2018-05-30 VITALS — BP 121/67 | HR 71

## 2018-05-30 DIAGNOSIS — B351 Tinea unguium: Secondary | ICD-10-CM

## 2018-05-30 DIAGNOSIS — L84 Corns and callosities: Secondary | ICD-10-CM | POA: Diagnosis not present

## 2018-05-30 DIAGNOSIS — M79675 Pain in left toe(s): Secondary | ICD-10-CM

## 2018-05-30 DIAGNOSIS — M79674 Pain in right toe(s): Secondary | ICD-10-CM

## 2018-05-30 DIAGNOSIS — G629 Polyneuropathy, unspecified: Secondary | ICD-10-CM

## 2018-05-30 DIAGNOSIS — M545 Low back pain, unspecified: Secondary | ICD-10-CM

## 2018-05-30 DIAGNOSIS — M5431 Sciatica, right side: Secondary | ICD-10-CM

## 2018-05-30 DIAGNOSIS — Z22322 Carrier or suspected carrier of Methicillin resistant Staphylococcus aureus: Secondary | ICD-10-CM | POA: Insufficient documentation

## 2018-05-30 NOTE — Telephone Encounter (Signed)
Copied from [place] #[id number]. Topic: General - Other >> May 30, 2018 10:25 AM Percell Belt A wrote: Reason for CRM:   pt wife called in for pt and stated that his back is not any better.  They would like to know if there is something else that can be done.  He stated it is still hurting the [place] area, back and knees.  Please advise  Pt was last seen 05/10/2018    Pharmacy -[address] [place] - [place], [place] - [address] [phone] (Phone

## 2018-05-30 NOTE — Progress Notes (Signed)
Subjective: Benjamin Cardenas presents today  with cc of painful calluses on the plantar aspect of both feet. Duration is greater than 10 years. Aggravating factor is weightbearing with and without shoe gear. He has tried soaking in Listerine and white vinegar and scraping the softened skin with his fingernails.  He also has  discolored, thick toenails b/l great toes and second digits which interfere with daily activities.  Pain is aggravated when wearing enclosed shoe gear.   He states he will be going to [place] to celebrate his brother's 90th birthday and wants to be able to walk comfortably.  Past Medical History:  Diagnosis Date  . Bladder cancer [facility]) urology-- dr Karsten Ro   dx 11/ 2016  Transitional cell carcinoma, High grade invasive , Stage T1  G3 ---  s/p  TURBT and Mitomycin C instillation  . BPH with urinary obstruction   . Dry skin dermatitis   . Elevated PSA   . History of colon polyps   . Hydronephrosis, left   . Lower urinary tract symptoms (LUTS)   . OA (osteoarthritis)   . Prostate nodule   . Seasonal allergic rhinitis   . Wears glasses      Patient Active Problem List   Diagnosis Date Noted  . MRSA (methicillin resistant Staphylococcus aureus) carrier 05/30/2018  . Bladder cancer ([place]) 03/10/2018  . CAP (community acquired pneumonia) 06/26/2016  . Hyponatremia 12/13/2015  . Anemia 12/13/2015  . Altered mental status 12/13/2015  . Constipation 08/17/2015  . Memory impairment 12/04/2014  . Keratosis pilaris 02/25/2011  . 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 Inguinal hernia unilateral, non-recurrent, left 01/28/2011  . OTHER SYMPTOMS INVOLVING CARDIOVASCULAR SYSTEM 09/20/2009  . Osteoarthritis 01/31/2009  . SCIATICA, ACUTE 12/20/2007  . NECK PAIN 02/15/2007  . BPH (benign prostatic hyperplasia) 01/06/2007  . History of colonic polyps 01/06/2007     Past Surgical History:  Procedure Laterality Date  . cataract surgery    . CYSTOSCOPY WITH BIOPSY N/A 04/22/2015    Procedure: CYSTOSCOPY WITH BLADDER BIOPSY;  Surgeon: Kathie Rhodes, MD;  Location[facility];  Service: Urology;  Laterality: N/A;  . CYSTOSCOPY WITH RETROGRADE PYELOGRAM, URETEROSCOPY AND STENT PLACEMENT Left 02/25/2015   Procedure: LEFT RETROGRADE PYELOGRAM, URETEROSCOPY ;  Surgeon: Kathie Rhodes, MD;  Location: [facility];  Service: Urology;  Laterality: Left;  . HERNIA REPAIR  age 40 1937   rupture right inguinal hernia  . INGUINAL HERNIA REPAIR  03/02/2011   Procedure: HERNIA REPAIR INGUINAL ADULT;  Surgeon: Earnstine Regal, MD;  Location: [facility];  Service: General;  Laterality: Left;  with mesh   . TRANSURETHRAL RESECTION OF BLADDER TUMOR WITH GYRUS (TURBT-GYRUS) N/A 02/25/2015   Procedure: TRANSURETHRAL RESECTION OF BLADDER TUMOR WITH GYRUS (TURBT-GYRUS);  Surgeon: Kathie Rhodes, MD;  Location: [facility];  Service: Urology;  Laterality: N/A;      Current Outpatient Medications:  .  ALOE VERA JUICE LIQD, Take 30 mLs by mouth every morning., Disp: , Rfl:  .  aspirin EC 81 MG tablet, Take 81 mg by mouth at bedtime. , Disp: , Rfl:  .  Black Pepper-Turmeric (TURMERIC COMPLEX/BLACK PEPPER PO), Take 1 capsule by mouth daily., Disp: , Rfl:  .  cetirizine (ZYRTEC) 10 MG tablet, Take 10 mg by mouth daily as needed for allergies., Disp: , Rfl:  .  naphazoline-pheniramine (NAPHCON-A) 0.025-0.3 % ophthalmic solution, Place 1 drop into both eyes 2 (two) times daily., Disp: , Rfl:  .  protein supplement shake (  PREMIER PROTEIN) LIQD, Take 2 oz by mouth daily. Add to PET butter pecan ice cream for calorie/protein supplement., Disp: , Rfl:  .  triamcinolone ointment (KENALOG) 0.1 %, Apply 1 application topically 2 (two) times daily. , Disp: , Rfl:  .  TRULANCE 3 MG TABS, Take 3 mg by mouth every morning. , Disp: , Rfl:  .  Wheat Dextrin (BENEFIBER DRINK MIX PO), Take 5 mLs by mouth 2 (two) times daily. , Disp: , Rfl:    Allergies  Allergen Reactions  .  Amoxicillin Rash    Has patient had a PCN reaction causing immediate rash, facial/tongue/throat swelling, SOB or lightheadedness with hypotension:No Has patient had a PCN reaction causing severe rash involving mucus membranes or skin necrosis:unsure Has patient had a PCN reaction that required hospitalization:No Has patient had a PCN reaction occurring within the last 10 years:unsure If all of the above answers are "NO", then may proceed with Cephalosporin use.   . Ciprofloxacin Swelling and Rash  . Sulfasalazine Swelling  . Sulfa Antibiotics Swelling  . Doxycycline Hyclate Rash  . Levofloxacin Rash     Social History   Occupational History  . Not on file  Tobacco Use  . Smoking status: Former Smoker    Packs/day: 1.50    Years: 34.00    Pack years: 51.00    Types: Cigarettes    Last attempt to quit: 02/17/1979    Years since quitting: 39.3  . Smokeless tobacco: Never Used  Substance and Sexual Activity  . Alcohol use: Yes    Comment: occasional  . Drug use: No  . Sexual activity: Not on file     Family History  Problem Relation Age of Onset  . Heart disease Mother   . Heart disease Father   . Cancer Sister        breast  . Cancer Brother        colon     Immunization History  Administered Date(s) Administered  . Influenza Split 01/28/2011, 02/03/2012  . Influenza Whole 04/07/1999, 02/15/2007, 01/19/2008, 01/31/2009, 01/30/2010  . Influenza, High Dose Seasonal PF 01/18/2013, 02/04/2015, 12/31/2015, 01/06/2018  . Influenza,inj,Quad PF,6+ Mos 01/24/2014  . Influenza-Unspecified 01/05/2017  . Pneumococcal Conjugate-13 09/25/2013  . Pneumococcal Polysaccharide-23 12/05/2001  . Td 01/04/1997, 07/22/2007, 03/24/2018  . Zoster 05/05/2005  . Zoster Recombinat (Shingrix) 03/10/2018, 05/16/2018     Review of systems: Positive Findings in bold print.  Constitutional:  chills, fatigue, fever, sweats, weight change Communication: Optometrist, sign Ecologist,  hand writing, iPad/Android device Head: headaches, head injury Eyes: changes in vision, eye pain, glaucoma, cataracts, macular degeneration, diplopia, glare,  light sensitivity, eyeglasses or contacts, blindness Ears nose mouth throat: Hard of hearing, ringing in ears, deaf, sign language,  vertigo,   nosebleeds,  rhinitis,  cold sores, snoring, swollen glands Cardiovascular: HTN, edema, arrhythmia, pacemaker in place, defibrillator in place,  chest pain/tightness, chronic anticoagulation, blood clot, heart failure Peripheral Vascular: leg cramps, varicose veins, blood clots, lymphedema Respiratory:  difficulty breathing, denies congestion, SOB, wheezing, cough, emphysema Gastrointestinal: change in appetite or weight, abdominal pain, constipation, diarrhea, nausea, vomiting, vomiting blood, change in bowel habits, abdominal pain, jaundice, rectal bleeding, hemorrhoids, Genitourinary:  nocturia,  pain on urination,  blood in urine, Foley catheter, urinary urgency Musculoskeletal: uses mobility aid,  cramping, stiff joints, painful joints, decreased joint motion, fractures, OA, gout, sciatica Skin: +changes in toenails, color change, dryness, itching, mole changes,  rash  Neurological: headaches, numbness in feet, paresthesias in feet, burning in feet,  fainting,  seizures, change in speech. denies headaches, memory problems/poor historian, cerebral palsy, weakness, paralysis Endocrine: diabetes, hypothyroidism, hyperthyroidism,  goiter, dry mouth, flushing, heat intolerance,  cold intolerance,  excessive thirst, denies polyuria,  nocturia Hematological:  easy bleeding, excessive bleeding, easy bruising, enlarged lymph nodes, on long term blood thinner, history of past transusions Allergy/immunological:  hives, eczema, frequent infections, multiple drug allergies, seasonal allergies, transplant recipient Psychiatric:  anxiety, depression, mood disorder, suicidal ideations, hallucinations    Objective: Vascular Examination:  Capillary refill time <3 seconds x 10 digits  Dorsalis pedis and posterior tibial pulses present b/l  No digital hair x 10 digits  Skin temperature gradient WNL b/l  Dermatological Examination: Skin with normal turgor, texture and tone b/l  Toenails b/l great and 2nd digits diiscolored, thick, dystrophic with subungual debris and pain with palpation to nailbeds due to thickness of nails.  Porokeratotic lesions submet head 1, 5 b/l with tenderness to palpation and subdermal hemorrhage. No erythema, no edema, no drainage, no flocculence.  Porokeratotic lesions submet head 5 b/l + tenderness to palpation and subdermal hemorrhage. No erythema, no edema, no drainage, no flocculence.  Predebridement measurements 1.5 cm annular lesion submet head 5 b/l. Postdebridement measurements 1.5 cm in diameter.  Porokeratotic lesions submet head 1 b/l: + tenderness to palpation and subdermal hemorrhage. No erythema, no edema, no drainage, no flocculence. Predebridement measurements 2.0 cm annular lesion submet head 1 b/l. Postdebridement measurements 2.0 cm annular lesion submet head 1 b/l  Musculoskeletal: Muscle strength 5/5 to all LE muscle groups  Neurological: Sensation intact with 10 gram monofilament  Vibratory sensation decreased b/l  Assessment: 1. Painful onychomycosis toenails b/l great, 2nd toes  2. Preulcerative calluses b/l submet head 1, 2 b/l 3. Neuropathy b/l  Plan: 1. Discussed onychomycosis and treatment options.  Literature dispensed on today. 2. Toenails b/l great and 2nd digits were debrided in length and girth without iatrogenic bleeding. 3. Preulcerative lesions pared submet head 1, 5 b/l. Triple antibiotic ointment applied. Wife instructed to apply Neosporin Cream to areas once daily for one week. 4. Patient to continue soft, supportive shoe gear 5. Patient to report any pedal injuries to medical professional  immediately. 6. Follow up 10 weeks. 7. Patient/POA to call should there be a concern in the interim.

## 2018-05-30 NOTE — Patient Instructions (Signed)
Onychomycosis/Fungal Toenails  WHAT IS IT? An infection that lies within the keratin of your nail plate that is caused by a fungus.  WHY ME? Fungal infections affect all ages, sexes, races, and creeds.  There may be many factors that predispose you to a fungal infection such as age, coexisting medical conditions such as diabetes, or an autoimmune disease; stress, medications, fatigue, genetics, etc.  Bottom line: fungus thrives in a warm, moist environment and your shoes offer such a location.  IS IT CONTAGIOUS? Theoretically, yes.  You do not want to share shoes, nail clippers or files with someone who has fungal toenails.  Walking around barefoot in the same room or sleeping in the same bed is unlikely to transfer the organism.  It is important to realize, however, that fungus can spread easily from one nail to the next on the same foot.  HOW DO WE TREAT THIS?  There are several ways to treat this condition.  Treatment may depend on many factors such as age, medications, pregnancy, liver and kidney conditions, etc.  It is best to ask your doctor which options are available to you.  1. No treatment.   Unlike many other medical concerns, you can live with this condition.  However for many people this can be a painful condition and may lead to ingrown toenails or a bacterial infection.  It is recommended that you keep the nails cut short to help reduce the amount of fungal nail. 2. Topical treatment.  These range from herbal remedies to prescription strength nail lacquers.  About 40-50% effective, topicals require twice daily application for approximately 9 to 12 months or until an entirely new nail has grown out.  The most effective topicals are medical grade medications available through physicians offices. 3. Oral antifungal medications.  With an 80-90% cure rate, the most common oral medication requires 3 to 4 months of therapy and stays in your system for a year as the new nail grows out.  Oral  antifungal medications do require blood work to make sure it is a safe drug for you.  A liver function panel will be performed prior to starting the medication and after the first month of treatment.  It is important to have the blood work performed to avoid any harmful side effects.  In general, this medication safe but blood work is required. 4. Laser Therapy.  This treatment is performed by applying a specialized laser to the affected nail plate.  This therapy is noninvasive, fast, and non-painful.  It is not covered by insurance and is therefore, out of pocket.  The results have been very good with a 80-95% cure rate.  [facility] is the only practice in the area to offer this therapy. Permanent Nail Avulsion.  Removing the entire nail so that a new nail will not grow back.Corns and Calluses Corns are small areas of thickened skin that occur on the top, sides, or tip of a toe. They contain a cone-shaped core with a point that can press on a nerve below. This causes pain.  Calluses are areas of thickened skin that can occur anywhere on the body, including the hands, fingers, palms, soles of the feet, and heels. Calluses are usually larger than corns. What are the causes? Corns and calluses are caused by rubbing (friction) or pressure, such as from shoes that are too tight or do not fit properly. What increases the risk? Corns are more likely to develop in people who have misshapen   toes (toe deformities), such as hammer toes. Calluses can occur with friction to any area of the skin. They are more likely to develop in people who:  Work with their hands.  Wear shoes that fit poorly, are too tight, or are high-heeled.  Have toe deformities. What are the signs or symptoms? Symptoms of a corn or callus include:  A hard growth on the skin.  Pain or tenderness under the skin.  Redness and swelling.  Increased discomfort while wearing tight-fitting shoes, if your feet are affected. If a  corn or callus becomes infected, symptoms may include:  Redness and swelling that gets worse.  Pain.  Fluid, blood, or pus draining from the corn or callus. How is this diagnosed? Corns and calluses may be diagnosed based on your symptoms, your medical history, and a physical exam. How is this treated? Treatment for corns and calluses may include:  Removing the cause of the friction or pressure. This may involve: ? Changing your shoes. ? Wearing shoe inserts (orthotics) or other protective layers in your shoes, such as a corn pad. ? Wearing gloves.  Applying medicine to the skin (topical medicine) to help soften skin in the hardened, thickened areas.  Removing layers of dead skin with a file to reduce the size of the corn or callus.  Removing the corn or callus with a scalpel or laser.  Taking antibiotic medicines, if your corn or callus is infected.  Having surgery, if a toe deformity is the cause. Follow these instructions at home:   Take over-the-counter and prescription medicines only as told by your health care provider.  If you were prescribed an antibiotic, take it as told by your health care provider. Do not stop taking it even if your condition starts to improve.  Wear shoes that fit well. Avoid wearing high-heeled shoes and shoes that are too tight or too loose.  Wear any padding, protective layers, gloves, or orthotics as told by your health care provider.  Soak your hands or feet and then use a file or pumice stone to soften your corn or callus. Do this as told by your health care provider.  Check your corn or callus every day for symptoms of infection. Contact a health care provider if you:  Notice that your symptoms do not improve with treatment.  Have redness or swelling that gets worse.  Notice that your corn or callus becomes painful.  Have fluid, blood, or pus coming from your corn or callus.  Have new symptoms. Summary  Corns are small areas of  thickened skin that occur on the top, sides, or tip of a toe.  Calluses are areas of thickened skin that can occur anywhere on the body, including the hands, fingers, palms, and soles of the feet. Calluses are usually larger than corns.  Corns and calluses are caused by rubbing (friction) or pressure, such as from shoes that are too tight or do not fit properly.  Treatment may include wearing any padding, protective layers, gloves, or orthotics as told by your health care provider. This information is not intended to replace advice given to you by your health care provider. Make sure you discuss any questions you have with your health care provider. Document Released: 12/28/2003 Document Revised: 02/03/2017 Document Reviewed: 02/03/2017 Elsevier Interactive Patient Education  2019 [employer]  

## 2018-05-31 NOTE — Telephone Encounter (Signed)
Let's send in a PT order to eval and treat.

## 2018-05-31 NOTE — Telephone Encounter (Signed)
Spoke with patient and referral placed

## 2018-06-08 ENCOUNTER — Ambulatory Visit: Payer: Medicare Other | Admitting: Physical Therapy

## 2018-06-15 ENCOUNTER — Ambulatory Visit: Payer: Medicare Other | Attending: Internal Medicine | Admitting: Physical Therapy

## 2018-06-15 ENCOUNTER — Other Ambulatory Visit: Payer: Self-pay

## 2018-06-15 ENCOUNTER — Ambulatory Visit: Payer: Medicare Other | Admitting: Physical Therapy

## 2018-06-15 ENCOUNTER — Encounter: Payer: Self-pay | Admitting: Physical Therapy

## 2018-06-15 DIAGNOSIS — R293 Abnormal posture: Secondary | ICD-10-CM | POA: Diagnosis not present

## 2018-06-15 DIAGNOSIS — M545 Low back pain: Secondary | ICD-10-CM | POA: Insufficient documentation

## 2018-06-15 DIAGNOSIS — M6281 Muscle weakness (generalized): Secondary | ICD-10-CM | POA: Insufficient documentation

## 2018-06-15 DIAGNOSIS — G8929 Other chronic pain: Secondary | ICD-10-CM | POA: Insufficient documentation

## 2018-06-15 NOTE — Therapy (Signed)
[facility] [address], [place], [place], [address] Phone: [phone]   Fax:  [phone]  Physical Therapy Evaluation  Patient Details  Name: Benjamin Cardenas MRN: [id number] Date of Birth: 1930/01/20 Referring Provider (PT): Isaac Bliss, Rayford Halsted, MD   Encounter Date: 06/15/2018  PT End of Session - 06/15/18 1903    Visit Number  1    Date for PT Re-Evaluation  07/27/18    Authorization Type  Medicare [employer]    PT Start Time  1233    PT Stop Time  1315    PT Time Calculation (min)  42 min    Activity Tolerance  Patient tolerated treatment well;Patient limited by pain    Behavior During Therapy  [facility] for tasks assessed/performed       Past Medical History:  Diagnosis Date  . Bladder cancer [facility]) urology-- dr Karsten Ro   dx 11/ 2016  Transitional cell carcinoma, High grade invasive , Stage T1  G3 ---  s/p  TURBT and Mitomycin C instillation  . BPH with urinary obstruction   . Dry skin dermatitis   . Elevated PSA   . History of colon polyps   . Hydronephrosis, left   . Lower urinary tract symptoms (LUTS)   . OA (osteoarthritis)   . Prostate nodule   . Seasonal allergic rhinitis   . Wears glasses     Past Surgical History:  Procedure Laterality Date  . cataract surgery    . CYSTOSCOPY WITH BIOPSY N/A 04/22/2015   Procedure: CYSTOSCOPY WITH BLADDER BIOPSY;  Surgeon: Kathie Rhodes, MD;  [facility];  Service: Urology;  Laterality: N/A;  . CYSTOSCOPY WITH RETROGRADE PYELOGRAM, URETEROSCOPY AND STENT PLACEMENT Left 02/25/2015   Procedure: LEFT RETROGRADE PYELOGRAM, URETEROSCOPY ;  Surgeon: Kathie Rhodes, MD;  Location: [facility];  Service: Urology;  Laterality: Left;  . HERNIA REPAIR  age 53 1937   rupture right inguinal hernia  . INGUINAL HERNIA REPAIR  03/02/2011   Procedure: HERNIA REPAIR INGUINAL ADULT;  Surgeon: Earnstine Regal, MD;  Location: [facility];  Service:  General;  Laterality: Left;  with mesh   . TRANSURETHRAL RESECTION OF BLADDER TUMOR WITH GYRUS (TURBT-GYRUS) N/A 02/25/2015   Procedure: TRANSURETHRAL RESECTION OF BLADDER TUMOR WITH GYRUS (TURBT-GYRUS);  Surgeon: Kathie Rhodes, MD;  Location: [facility];  Service: Urology;  Laterality: N/A;    There were no vitals filed for this visit.   Subjective Assessment - 06/15/18 1233    Subjective  Pt has a one month history of insidious onset of low back pain.  Pt also reports Rt >Lt knee pain but is unsure whether it is related to his back.  Knees started hurting at the same time as his back.  He has had to modify gym routine since onset of pain.  Pt also reports central groin pain but MD has ruled out hernia and recurrence of bladder cancer.    Patient is accompained by:  Family member   wife   Pertinent History  bladder cancer, in remission. Pt with current lower central and right sided abdominal/groin pain. Has been to MD and r/o hernia and bladder cancer recurrence.    Limitations  Walking;Standing    How long can you sit comfortably?  15    How long can you stand comfortably?  15    How long can you walk comfortably?  15 min - 35 min    Diagnostic tests  no  Patient Stated Goals  routine to gym routine, get rid of pain, be able to stand longer with less pain for church    Currently in Pain?  Yes    Pain Score  4    can get up to 8/10   Pain Location  Back    Pain Orientation  Right;Left;Lower;Upper    Pain Descriptors / Indicators  Sore;Tightness    Pain Type  Chronic pain    Pain Onset  More than a month ago    Pain Frequency  Constant    Aggravating Factors   standing, walking    Pain Relieving Factors  ice    Effect of Pain on Daily Activities  gym routine         [facility] PT Assessment - 06/15/18 0001      Assessment   Medical Diagnosis  M54.5 (ICD-10-CM) - Low back pain, unspecified back pain laterality, unspecified chronicity, unspecified whether sciatica  present M54.31 (ICD-10-CM) - Sciatica of right side    Referring Provider (PT)  Isaac Bliss, Rayford Halsted, MD    Onset Date/Surgical Date  --   4 weeks ago   Next MD Visit  no    Prior Therapy  no      Precautions   Precautions  None      Restrictions   Weight Bearing Restrictions  No      Balance Screen   Has the patient fallen in the past 6 months  No    Has the patient had a decrease in activity level because of a fear of falling?   No    Is the patient reluctant to leave their home because of a fear of falling?   No      Home Environment   Living Environment  Private residence    Living Arrangements  Spouse/significant other    Type of [place] to enter    Entrance Stairs-Number of Steps  2    Entrance Stairs-Rails  Can reach both    [place] to live on main level with bedroom/bathroom;Two level    Alternate Level Stairs-Number of Steps  12    Home Equipment  None      Prior Function   Level of Independence  Independent      Cognition   Overall Cognitive Status  Within Functional Limits for tasks assessed      Observation/Other Assessments   Focus on Therapeutic Outcomes (FOTO)   55%   goal 40#     Functional Tests   Functional tests  Sit to Stand      Sit to Stand   Comments  can perform without UEs with pain reported in transition from sit to stand      Posture/Postural Control   Posture/Postural Control  Postural limitations    Postural Limitations  Flexed trunk;Increased thoracic kyphosis;Forward head;Rounded Shoulders    Posture Comments  Pt able to correct with cueing, not obligatory      ROM / Strength   AROM / PROM / Strength  AROM;Strength;PROM      AROM   AROM Assessment Site  Lumbar    Lumbar Flexion  50% with pain    Lumbar Extension  10% without pain    Lumbar - Right Side Bend  50% with Rt LBP    Lumbar - Left Side Bend  50% without pain      PROM   Overall PROM Comments  bil hips limited by 50% for  ER/IR, limited by 30% for flexion      Strength   Strength Assessment Site  Hip;Knee;Ankle    Right/Left Hip  Right;Left    Right Hip Flexion  3+/5    Right Hip Extension  3+/5    Right Hip External Rotation   4-/5    Right Hip Internal Rotation  4-/5    Right Hip ABduction  3+/5    Right Hip ADduction  4-/5    Left Hip Flexion  4-/5    Left Hip Extension  4-/5    Left Hip External Rotation  4-/5    Left Hip Internal Rotation  4/5    Left Hip ABduction  4-/5    Left Hip ADduction  4/5    Right/Left Knee  Right;Left    Right Knee Flexion  4/5    Right Knee Extension  4/5    Left Knee Flexion  4+/5    Left Knee Extension  4+/5    Right/Left Ankle  Right;Left    Right Ankle Dorsiflexion  4-/5    Right Ankle Plantar Flexion  4/5    Left Ankle Dorsiflexion  4+/5    Left Ankle Plantar Flexion  4/5      Flexibility   Soft Tissue Assessment /Muscle Length  yes    Hamstrings  limited by 60% bil      Special Tests    Special Tests  Lumbar    Lumbar Tests  Straight Leg Raise      Straight Leg Raise   Findings  Positive    Comment  bil at 50 deg      Ambulation/Gait   Ambulation/Gait  Yes    Assistive device  None    Gait Pattern  Step-through pattern;Decreased step length - left;Decreased step length - right;Decreased arm swing - left;Decreased arm swing - right;Right flexed knee in stance;Left flexed knee in stance;Trunk flexed;Wide base of support      Balance   Balance Assessed  Yes      Standardized Balance Assessment   Standardized Balance Assessment  Five Times Sit to Stand    Five times sit to stand comments   20 sec, no UE support, back pain reported      High Level Balance   High Level Balance Comments  unable to stand in SLS bil without max UE support                Objective measurements completed on examination: See above findings.              PT Education - 06/15/18 1338    Education Details  Access Code: [email or web address]     Person(s)  Educated  Patient;Spouse    Methods  Explanation;Demonstration;Tactile cues;Verbal cues    Comprehension  Verbalized understanding;Returned demonstration;Verbal cues required       PT Short Term Goals - 06/15/18 1920      PT SHORT TERM GOAL #1   Title  Pt will be I in initial HEP for gentle ROM, stretching and postural strength.    Time  4    Period  Weeks    Status  New    Target Date  07/13/18      PT SHORT TERM GOAL #2   Title  Pt will report overall reduction in pain throughout the day by at least 20%    Time  4    Period  Weeks    Status  New    Target Date  07/13/18      PT SHORT TERM GOAL #3   Title  Pt will be able to demo proper seated and standing postures to reduce strain on lumbar spine.    Time  4    Period  Weeks    Status  New    Target Date  07/13/18      PT SHORT TERM GOAL #4   Title  Pt will be able to perform 5x sit to stand in </= 18 sec with proper use of core to eliminate pain with performance.    Time  4    Period  Weeks    Status  New    Target Date  07/13/18        PT Long Term Goals - 06/15/18 1922      PT LONG TERM GOAL #1   Title  Pt will be I in advanced HEP and understand safety in progression of exercise at his gym.    Time  6    Period  Weeks    Status  New    Target Date  07/27/18      PT LONG TERM GOAL #2   Title  Pt will achieve strength rating throughout bil LEs to at least 4+/5 to improve functional tasks such as stairs and sit to stand with less strain across knees and spinal joints.    Time  6    Period  Weeks    Status  New    Target Date  07/27/18      PT LONG TERM GOAL #3   Title  Pt will reduce FOTO to </= 40% to demo less limitation in daily tasks.    Time  6    Period  Weeks    Status  New    Target Date  07/27/18      PT LONG TERM GOAL #4   Title  Pt will be able to perform standing activities for at least 30 min with pain </= 3/10 to improve participation in church and other community activities.    Time  6     Period  Weeks    Status  New    Target Date  07/27/18      PT LONG TERM GOAL #5   Title  Pt will be able to sit for at least 30 min with pain </= 3/10 to improve tolerance of activities such as driving/travel.    Time  6    Period  Weeks    Status  New    Target Date  07/27/18             Plan - 06/15/18 1904    Clinical Impression Statement  Pt is an 85yo man accompanied by his wife with recent history of insidious onset of LBP Rt>Lt approx 1 month ago.  He has bil knee pain Rt>Lt as well which also started approx 1 mo ago.  Of note is lower central abdominal pain and Rt groin pain.  Pt has a history of bladder cancer but has been regularly checked for this; hernia was also recently ruled out.  He presents with flexed trunk posture and gait, limited trunk and hip ROM and flexibility with low back pain with most active and passive movements.  He has limited Rt SI joint mobility.  Weakness is present throughout bil LEs Rt>Lt.  He is unable to balance on either LE in SLS without UE support.  He  performed 5x sit to stand in 20 sec with report of LBP in transition from sit to stand.  He has poor core muscle strength and reported feeling out of breath with light movement based exercises.  Pt has become less active in his gym routine since onset of pain.  He will benefit from skilled PT for gentle ROM, modalities for pain, flexibility, LE and core strength, postural re-ed and functional training to reduce pain and improve his tolerance of daily activities.    Personal Factors and Comorbidities  Age;Comorbidity 1;Comorbidity 2    Comorbidities  history of bladder cancer, memory trouble (fogginess since CA treatment per wife)    Examination-Activity Limitations  Locomotion Level;Bend;Sit;Squat;Stand;Lift    Examination-Participation Restrictions  Community Activity;Church    Stability/Clinical Decision Making  Evolving/Moderate complexity    Clinical Decision Making  Moderate    Rehab  Potential  Good    PT Frequency  2x / week    PT Duration  6 weeks    PT Treatment/Interventions  ADLs/Self Care Home Management;Aquatic Therapy;Cryotherapy;Electrical Stimulation;Moist Heat;Iontophoresis 4mg /ml Dexamethasone;Traction;Gait training;Stair training;Functional mobility training;Therapeutic activities;Therapeutic exercise;Balance training;Neuromuscular re-education;Patient/family education;Manual techniques;Passive range of motion;Dry needling;Taping;Spinal Manipulations;Joint Manipulations    PT Next Visit Plan  f/u on HEP, passive hip ROM/stretching as tol, thoracic extension ROM, beginning core contractions via UEs, postural re-ed for sitting (towel roll) and standing alignment    PT Home Exercise Plan  Access Code: [email or web address]    Consulted and Agree with Plan of Care  Patient;Family member/caregiver    Family Member Consulted  wife       Patient will benefit from skilled therapeutic intervention in order to improve the following deficits and impairments:  Abnormal gait, Decreased range of motion, Difficulty walking, Cardiopulmonary status limiting activity, Decreased endurance, Decreased activity tolerance, Pain, Decreased balance, Hypomobility, Impaired flexibility, Improper body mechanics, Decreased cognition, Decreased mobility, Decreased strength, Postural dysfunction  Visit Diagnosis: Chronic bilateral low back pain without sciatica - Plan: PT plan of care cert/re-cert  Abnormal posture - Plan: PT plan of care cert/re-cert  Muscle weakness (generalized) - Plan: PT plan of care cert/re-cert     Problem List Patient Active Problem List   Diagnosis Date Noted  . MRSA (methicillin resistant Staphylococcus aureus) carrier 05/30/2018  . Bladder cancer ([place]) 03/10/2018  . CAP (community acquired pneumonia) 06/26/2016  . Hyponatremia 12/13/2015  . Anemia 12/13/2015  . Altered mental status 12/13/2015  . Constipation 08/17/2015  . Memory impairment 12/04/2014  . Keratosis  pilaris 02/25/2011  . 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 Inguinal hernia unilateral, non-recurrent, left 01/28/2011  . OTHER SYMPTOMS INVOLVING CARDIOVASCULAR SYSTEM 09/20/2009  . Osteoarthritis 01/31/2009  . SCIATICA, ACUTE 12/20/2007  . NECK PAIN 02/15/2007  . BPH (benign prostatic hyperplasia) 01/06/2007  . History of colonic polyps 01/06/2007    Baruch Merl, PT 06/15/18 7:34 PM   [facility] [address] [place], [place], [address] Phone: [phone]   Fax:  [phone]  Name: Benjamin Cardenas MRN: [id number] Date of Birth: November 22, 1929

## 2018-06-15 NOTE — Patient Instructions (Signed)
Access Code: [email or web address]  URL: [email or web address]  Date: 06/15/2018  Prepared by: Venetia Night Beuhring   Exercises  Hooklying Single Knee to Chest - 10 reps - 2 sets - 1x daily - 7x weekly  Supine Double Knee to Chest - 10 reps - 2 sets - 1x daily - 7x weekly  Supine Lower Trunk Rotation - 10 reps - 2 sets - 1x daily - 7x weekly  Seated Hamstring Stretch - 2 reps - 2 sets - 20 hold - 1x daily - 7x weekly  Side to Side Weight Shift with Counter Support - 10 reps - 2 sets - 1x daily - 7x weekly

## 2018-06-20 ENCOUNTER — Other Ambulatory Visit: Payer: Self-pay

## 2018-06-20 ENCOUNTER — Ambulatory Visit: Payer: Medicare Other | Admitting: Physical Therapy

## 2018-06-20 ENCOUNTER — Encounter: Payer: Self-pay | Admitting: Physical Therapy

## 2018-06-20 DIAGNOSIS — R293 Abnormal posture: Secondary | ICD-10-CM

## 2018-06-20 DIAGNOSIS — G8929 Other chronic pain: Secondary | ICD-10-CM | POA: Diagnosis not present

## 2018-06-20 DIAGNOSIS — M545 Low back pain, unspecified: Secondary | ICD-10-CM

## 2018-06-20 DIAGNOSIS — M6281 Muscle weakness (generalized): Secondary | ICD-10-CM

## 2018-06-20 NOTE — Therapy (Signed)
[facility] [address], [place], [place], [address] Phone: [phone]   Fax:  [phone]  Physical Therapy Treatment  Patient Details  Name: Benjamin Cardenas MRN: [id number] Date of Birth: Apr 08, 1929 Referring Provider (PT): Isaac Bliss, Rayford Halsted, MD   Encounter Date: 06/20/2018  PT End of Session - 06/20/18 1146    Visit Number  2    Date for PT Re-Evaluation  07/27/18    Authorization Type  Medicare [employer]    PT Start Time  1100    PT Stop Time  1145    PT Time Calculation (min)  45 min    Activity Tolerance  Patient tolerated treatment well;Patient limited by pain    Behavior During Therapy  [facility] for tasks assessed/performed       Past Medical History:  Diagnosis Date  . Bladder cancer [facility]) urology-- dr Karsten Ro   dx 11/ 2016  Transitional cell carcinoma, High grade invasive , Stage T1  G3 ---  s/p  TURBT and Mitomycin C instillation  . BPH with urinary obstruction   . Dry skin dermatitis   . Elevated PSA   . History of colon polyps   . Hydronephrosis, left   . Lower urinary tract symptoms (LUTS)   . OA (osteoarthritis)   . Prostate nodule   . Seasonal allergic rhinitis   . Wears glasses     Past Surgical History:  Procedure Laterality Date  . cataract surgery    . CYSTOSCOPY WITH BIOPSY N/A 04/22/2015   Procedure: CYSTOSCOPY WITH BLADDER BIOPSY;  Surgeon: Kathie Rhodes, MD;  Location: [facility];  Service: Urology;  Laterality: N/A;  . CYSTOSCOPY WITH RETROGRADE PYELOGRAM, URETEROSCOPY AND STENT PLACEMENT Left 02/25/2015   Procedure: LEFT RETROGRADE PYELOGRAM, URETEROSCOPY ;  Surgeon: Kathie Rhodes, MD;  Location: [facility];  Service: Urology;  Laterality: Left;  . HERNIA REPAIR  age 22 1937   rupture right inguinal hernia  . INGUINAL HERNIA REPAIR  03/02/2011   Procedure: HERNIA REPAIR INGUINAL ADULT;  Surgeon: Earnstine Regal, MD;  Location: [facility];  Service:  General;  Laterality: Left;  with mesh   . TRANSURETHRAL RESECTION OF BLADDER TUMOR WITH GYRUS (TURBT-GYRUS) N/A 02/25/2015   Procedure: TRANSURETHRAL RESECTION OF BLADDER TUMOR WITH GYRUS (TURBT-GYRUS);  Surgeon: Kathie Rhodes, MD;  Location: [facility];  Service: Urology;  Laterality: N/A;    There were no vitals filed for this visit.  Subjective Assessment - 06/20/18 1106    Subjective  Pain is not as bad as it was.  Just aches.  HEP is going ok and seems to be helping.    Pertinent History  bladder cancer, in remission. Pt with current lower central and right sided abdominal/groin pain. Has been to MD and r/o hernia and bladder cancer recurrence.    Limitations  Walking;Standing    Patient Stated Goals  routine to gym routine, get rid of pain, be able to stand longer with less pain for church    Pain Score  6     Pain Location  Back    Pain Orientation  Right    Pain Descriptors / Indicators  Tightness;Sore    Pain Onset  More than a month ago    Pain Frequency  Intermittent                       [facility] Adult PT Treatment/Exercise - 06/20/18 0001  Self-Care   Self-Care  Posture    Posture  seated with towel roll      Exercises   Exercises  Lumbar;Knee/Hip;Shoulder      Lumbar Exercises: Stretches   Active Hamstring Stretch  Left;Right;2 reps;20 seconds    Double Knee to Chest Stretch  5 reps;10 seconds    Double Knee to Chest Stretch Limitations  feet on red ball    Lower Trunk Rotation  5 reps;10 seconds    Other Lumbar Stretch Exercise  seated AA/ROM thoracic extension    Other Lumbar Stretch Exercise  seated trunk rotation 2x10 sec each      Lumbar Exercises: Aerobic   Nustep  L1 x 5'      Lumbar Exercises: Supine   Glut Set  10 reps;5 seconds    Isometric Hip Flexion  5 reps;5 seconds   bil   Large Ball Abdominal Isometric  5 reps;5 seconds   PT cued Pt not to hold his breath     Shoulder Exercises: Supine   Horizontal  ABduction  Strengthening;Both;15 reps;Theraband    Theraband Level (Shoulder Horizontal ABduction)  Level 2 (Red)    Horizontal ABduction Limitations  propped on wedge in semi-recline, towel roll under neck   Pt does not like pressure on back of head, no pillow     Shoulder Exercises: Seated   Row  Strengthening;Theraband;15 reps    Theraband Level (Shoulder Row)  Level 2 (Red)    Row Limitations  towel roll lumbar      Shoulder Exercises: Standing   Horizontal ABduction  Strengthening;Both;15 reps;Theraband    Theraband Level (Shoulder Horizontal ABduction)  Level 2 (Red)    Horizontal ABduction Limitations  HEP               PT Short Term Goals - 06/20/18 1147      PT SHORT TERM GOAL #1   Title  Pt will be I in initial HEP for gentle ROM, stretching and postural strength.    Status  Achieved        PT Long Term Goals - 06/15/18 1922      PT LONG TERM GOAL #1   Title  Pt will be I in advanced HEP and understand safety in progression of exercise at his gym.    Time  6    Period  Weeks    Status  New    Target Date  07/27/18      PT LONG TERM GOAL #2   Title  Pt will achieve strength rating throughout bil LEs to at least 4+/5 to improve functional tasks such as stairs and sit to stand with less strain across knees and spinal joints.    Time  6    Period  Weeks    Status  New    Target Date  07/27/18      PT LONG TERM GOAL #3   Title  Pt will reduce FOTO to </= 40% to demo less limitation in daily tasks.    Time  6    Period  Weeks    Status  New    Target Date  07/27/18      PT LONG TERM GOAL #4   Title  Pt will be able to perform standing activities for at least 30 min with pain </= 3/10 to improve participation in church and other community activities.    Time  6    Period  Weeks    Status  New  Target Date  07/27/18      PT LONG TERM GOAL #5   Title  Pt will be able to sit for at least 30 min with pain </= 3/10 to improve tolerance of activities  such as driving/travel.    Time  6    Period  Weeks    Status  New    Target Date  07/27/18            Plan - 06/20/18 1147    Clinical Impression Statement  Pt with good tolerance of low level ROM and isometric lower body and core activiation today.  He reported improved pain in sitting with use of towel roll.  He benefits from being propped on wedge with towel roll under neck vs pillow for all supine ther ex.  He needs cues to breath during ther ex.  Updated HEP today and added upper body strength for posture.  Pt will benefit from ongoing PT along current POC.    PT Frequency  2x / week    PT Duration  6 weeks    PT Treatment/Interventions  ADLs/Self Care Home Management;Aquatic Therapy;Cryotherapy;Electrical Stimulation;Moist Heat;Iontophoresis 4mg /ml Dexamethasone;Traction;Gait training;Stair training;Functional mobility training;Therapeutic activities;Therapeutic exercise;Balance training;Neuromuscular re-education;Patient/family education;Manual techniques;Passive range of motion;Dry needling;Taping;Spinal Manipulations;Joint Manipulations    PT Next Visit Plan  f/u on HEP, continue ROM/Stretching and postural re-ed/strength    PT Home Exercise Plan  Access Code: [email or web address]       Patient will benefit from skilled therapeutic intervention in order to improve the following deficits and impairments:  Abnormal gait, Decreased range of motion, Difficulty walking, Cardiopulmonary status limiting activity, Decreased endurance, Decreased activity tolerance, Pain, Decreased balance, Hypomobility, Impaired flexibility, Improper body mechanics, Decreased cognition, Decreased mobility, Decreased strength, Postural dysfunction  Visit Diagnosis: Chronic bilateral low back pain without sciatica  Abnormal posture  Muscle weakness (generalized)     Problem List Patient Active Problem List   Diagnosis Date Noted  . MRSA (methicillin resistant Staphylococcus aureus) carrier 05/30/2018  .  Bladder cancer ([place]) 03/10/2018  . CAP (community acquired pneumonia) 06/26/2016  . Hyponatremia 12/13/2015  . Anemia 12/13/2015  . Altered mental status 12/13/2015  . Constipation 08/17/2015  . Memory impairment 12/04/2014  . Keratosis pilaris 02/25/2011  . 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 Inguinal hernia unilateral, non-recurrent, left 01/28/2011  . OTHER SYMPTOMS INVOLVING CARDIOVASCULAR SYSTEM 09/20/2009  . Osteoarthritis 01/31/2009  . SCIATICA, ACUTE 12/20/2007  . NECK PAIN 02/15/2007  . BPH (benign prostatic hyperplasia) 01/06/2007  . History of colonic polyps 01/06/2007    Baruch Merl, PT 06/20/18 11:48 AM    [facility] [address], [place], [place], [address] Phone: [phone]   Fax:  [phone]  Name: Macaulay Reicher MRN: [id number] Date of Birth: 05-20-1929

## 2018-06-30 ENCOUNTER — Encounter: Payer: Medicare Other | Admitting: Physical Therapy

## 2018-07-05 ENCOUNTER — Encounter: Payer: Medicare Other | Admitting: Physical Therapy

## 2018-07-07 ENCOUNTER — Encounter: Payer: Medicare Other | Admitting: Physical Therapy

## 2018-07-18 ENCOUNTER — Encounter: Payer: Medicare Other | Admitting: Physical Therapy

## 2018-07-20 ENCOUNTER — Encounter: Payer: Medicare Other | Admitting: Physical Therapy

## 2018-07-22 ENCOUNTER — Other Ambulatory Visit: Payer: Self-pay

## 2018-07-22 ENCOUNTER — Encounter: Payer: Self-pay | Admitting: Physical Therapy

## 2018-07-22 ENCOUNTER — Ambulatory Visit: Payer: Medicare Other | Attending: Internal Medicine | Admitting: Physical Therapy

## 2018-07-22 DIAGNOSIS — R293 Abnormal posture: Secondary | ICD-10-CM | POA: Diagnosis not present

## 2018-07-22 DIAGNOSIS — M6281 Muscle weakness (generalized): Secondary | ICD-10-CM | POA: Insufficient documentation

## 2018-07-22 DIAGNOSIS — G8929 Other chronic pain: Secondary | ICD-10-CM | POA: Insufficient documentation

## 2018-07-22 DIAGNOSIS — M545 Low back pain, unspecified: Secondary | ICD-10-CM

## 2018-07-22 NOTE — Therapy (Signed)
[facility] [address] [place], [place], [address] Phone: [phone]   Fax:  [phone]  Physical Therapy Treatment/Recertification   Patient Details  Name: Benjamin Cardenas MRN: [id number] Date of Birth: 1930/02/27 Referring Provider (PT): Isaac Bliss, Rayford Halsted, MD   Encounter Date: 07/22/2018  PT End of Session - 07/22/18 1141    Visit Number  3    Date for PT Re-Evaluation  09/02/18    Authorization Type  Medicare [employer]    PT Start Time  1100    PT Stop Time  1143    PT Time Calculation (min)  43 min    Activity Tolerance  Patient tolerated treatment well       Past Medical History:  Diagnosis Date  . Bladder cancer [facility]) urology-- dr Karsten Ro   dx 11/ 2016  Transitional cell carcinoma, High grade invasive , Stage T1  G3 ---  s/p  TURBT and Mitomycin C instillation  . BPH with urinary obstruction   . Dry skin dermatitis   . Elevated PSA   . History of colon polyps   . Hydronephrosis, left   . Lower urinary tract symptoms (LUTS)   . OA (osteoarthritis)   . Prostate nodule   . Seasonal allergic rhinitis   . Wears glasses     Past Surgical History:  Procedure Laterality Date  . cataract surgery    . CYSTOSCOPY WITH BIOPSY N/A 04/22/2015   Procedure: CYSTOSCOPY WITH BLADDER BIOPSY;  Surgeon: Kathie Rhodes, MD;  Location: [facility];  Service: Urology;  Laterality: N/A;  . CYSTOSCOPY WITH RETROGRADE PYELOGRAM, URETEROSCOPY AND STENT PLACEMENT Left 02/25/2015   Procedure: LEFT RETROGRADE PYELOGRAM, URETEROSCOPY ;  Surgeon: Kathie Rhodes, MD;  Location: [facility];  Service: Urology;  Laterality: Left;  . HERNIA REPAIR  age 51 1937   rupture right inguinal hernia  . INGUINAL HERNIA REPAIR  03/02/2011   Procedure: HERNIA REPAIR INGUINAL ADULT;  Surgeon: Earnstine Regal, MD;  Location: [facility];  Service: General;  Laterality: Left;  with mesh   . TRANSURETHRAL RESECTION OF  BLADDER TUMOR WITH GYRUS (TURBT-GYRUS) N/A 02/25/2015   Procedure: TRANSURETHRAL RESECTION OF BLADDER TUMOR WITH GYRUS (TURBT-GYRUS);  Surgeon: Kathie Rhodes, MD;  Location[facility];  Service: Urology;  Laterality: N/A;    There were no vitals filed for this visit.  Subjective Assessment - 07/22/18 1055    Subjective  When I stand the pain is an 8/10.   Better with sitting down and lying on my left side.  Across low back and right knee.  Also groin area.  I feel OK generally with the exercises but the pain comes back. It was doing better but now it's stirred up.        Currently in Pain?  Yes    Pain Score  8     Pain Location  Back    Pain Orientation  Right;Left         [facility] PT Assessment - 07/22/18 0001      Assessment   Medical Diagnosis  M54.5 (ICD-10-CM) - Low back pain, unspecified back pain laterality, unspecified chronicity, unspecified whether sciatica present M54.31 (ICD-10-CM) - Sciatica of right side    Referring Provider (PT)  Isaac Bliss, Rayford Halsted, MD    Onset Date/Surgical Date  --   4 weeks ago   Next MD Visit  no    Prior Therapy  no  AROM   Lumbar Flexion  50% with pain    Lumbar Extension  10% without pain    Lumbar - Right Side Bend  50% with Rt LBP    Lumbar - Left Side Bend  50% without pain      Strength   Right Hip Flexion  3+/5    Right Hip Extension  3+/5    Right Hip External Rotation   4-/5    Right Hip Internal Rotation  4-/5    Right Hip ABduction  3+/5    Right Hip ADduction  4-/5    Left Hip Flexion  4-/5    Left Hip Extension  4-/5    Left Hip External Rotation  4-/5    Left Hip Internal Rotation  4/5    Left Hip ABduction  4-/5    Left Hip ADduction  4/5    Right Knee Flexion  4/5    Right Knee Extension  4/5    Left Knee Flexion  4+/5    Left Knee Extension  4+/5    Right Ankle Dorsiflexion  4-/5    Right Ankle Plantar Flexion  4/5    Left Ankle Dorsiflexion  4+/5    Left Ankle Plantar Flexion  4/5       Slump test   Comment  right lacks 15 degrees      Straight Leg Raise   Comment  bil at 50 deg                   [facility] Adult PT Treatment/Exercise - 07/22/18 0001      Lumbar Exercises: Stretches   Active Hamstring Stretch  Left;Right;2 reps;20 seconds    Active Hamstring Stretch Limitations  seated  with right ankle pumps      Lumbar Exercises: Aerobic   Nustep  L1-2 6:30 min       Lumbar Exercises: Standing   Other Standing Lumbar Exercises  pt demonstrates his ability to do push ups on the mat table 5x     Other Standing Lumbar Exercises  standing erect with palms outward 3x      Lumbar Exercises: Seated   Long Arc Quad on Chair  AROM;Right;10 reps    Sit to Stand  5 reps    Sit to Stand Limitations  No UE assist needed    Other Seated Lumbar Exercises  foam roll push down to activate transverse abdominals    Other Seated Lumbar Exercises  trunk extension black band 25x       Lumbar Exercises: Supine   Other Supine Lumbar Exercises  green ball hip/knee flexion 5x    Other Supine Lumbar Exercises  green ball lumbar rotation 5x each way       Manual Therapy   Manual therapy comments  left sidelying grade 1/2 "smile" oscillations to gap 30x    Joint Mobilization  long leg distraction oscillations 3-4 min each leg grade 1/2               PT Short Term Goals - 07/22/18 1214      PT SHORT TERM GOAL #1   Title  Pt will be I in initial HEP for gentle ROM, stretching and postural strength.    Status  Achieved      PT SHORT TERM GOAL #2   Title  Pt will report overall reduction in pain throughout the day by at least 20%    Time  4    Period  Weeks  Status  On-going    Target Date  08/12/18      PT SHORT TERM GOAL #3   Title  Pt will be able to demo proper seated and standing postures to reduce strain on lumbar spine.    Status  Achieved    Target Date  08/12/18      PT SHORT TERM GOAL #4   Title  Pt will be able to perform 5x sit to stand in  </= 18 sec with proper use of core to eliminate pain with performance.    Time  3    Period  Weeks    Status  New    Target Date  08/12/18        PT Long Term Goals - 07/22/18 1215      PT LONG TERM GOAL #1   Title  Pt will be I in advanced HEP and understand safety in progression of exercise at his gym.    Time  6    Period  Weeks    Status  On-going    Target Date  09/02/18      PT LONG TERM GOAL #2   Title  Pt will achieve strength rating throughout bil LEs to at least 4+/5 to improve functional tasks such as stairs and sit to stand with less strain across knees and spinal joints.    Time  6    Period  Weeks    Status  On-going      PT LONG TERM GOAL #3   Title  Pt will reduce FOTO to </= 40% to demo less limitation in daily tasks.    Time  6    Period  Weeks    Status  On-going      PT LONG TERM GOAL #4   Title  Pt will be able to perform standing activities for at least 30 min with pain </= 3/10 to improve participation in church and other community activities.    Time  6    Status  On-going      PT LONG TERM GOAL #5   Title  Pt will be able to sit for at least 30 min with pain </= 3/10 to improve tolerance of activities such as driving/travel.    Time  6    Period  Weeks    Status  On-going            Plan - 07/22/18 1201    Clinical Impression Statement  The patient returns after 1 month following facility shut down due to the coronavirus.  The patient reports of flare up of pain which led to his return to PT despite the ongoing pandemic.  He tends to walk with trunk flexed forward but he is able to stand fully erect when his attention is called to it.  He lacks terminal right knee extension with slump test.  He reponds fairly well with low level exercises with some modifications needed for pain.  Good response to manual therapy especially long leg distraction.  Recommend continuation of PT for pain control and return to function and prevent decline in  mobility.       Personal Factors and Comorbidities  Age;Comorbidity 1;Comorbidity 2    Comorbidities  history of bladder cancer, memory trouble (fogginess since CA treatment per wife)    Examination-Activity Limitations  Locomotion Level;Bend;Sit;Squat;Stand;Lift    Examination-Participation Restrictions  Community Activity;Church    Stability/Clinical Decision Making  Evolving/Moderate complexity    Rehab Potential  Good  PT Frequency  2x / week    PT Duration  6 weeks    PT Treatment/Interventions  ADLs/Self Care Home Management;Aquatic Therapy;Cryotherapy;Electrical Stimulation;Moist Heat;Iontophoresis 4mg /ml Dexamethasone;Traction;Gait training;Stair training;Functional mobility training;Therapeutic activities;Therapeutic exercise;Balance training;Neuromuscular re-education;Patient/family education;Manual techniques;Passive range of motion;Dry needling;Taping;Spinal Manipulations;Joint Manipulations    PT Next Visit Plan  add to  HEP, continue ROM/Stretching and postural re-ed/strength; Nu-Step;  check 5x sit to stand for STG       Patient will benefit from skilled therapeutic intervention in order to improve the following deficits and impairments:  Abnormal gait, Decreased range of motion, Difficulty walking, Cardiopulmonary status limiting activity, Decreased endurance, Decreased activity tolerance, Pain, Decreased balance, Hypomobility, Impaired flexibility, Improper body mechanics, Decreased cognition, Decreased mobility, Decreased strength, Postural dysfunction  Visit Diagnosis: Chronic bilateral low back pain without sciatica - Plan: PT plan of care cert/re-cert  Abnormal posture - Plan: PT plan of care cert/re-cert  Muscle weakness (generalized) - Plan: PT plan of care cert/re-cert     Problem List Patient Active Problem List   Diagnosis Date Noted  . MRSA (methicillin resistant Staphylococcus aureus) carrier 05/30/2018  . Bladder cancer ([place]) 03/10/2018  . CAP (community  acquired pneumonia) 06/26/2016  . Hyponatremia 12/13/2015  . Anemia 12/13/2015  . Altered mental status 12/13/2015  . Constipation 08/17/2015  . Memory impairment 12/04/2014  . Keratosis pilaris 02/25/2011  . 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 Inguinal hernia unilateral, non-recurrent, left 01/28/2011  . OTHER SYMPTOMS INVOLVING CARDIOVASCULAR SYSTEM 09/20/2009  . Osteoarthritis 01/31/2009  . SCIATICA, ACUTE 12/20/2007  . NECK PAIN 02/15/2007  . BPH (benign prostatic hyperplasia) 01/06/2007  . History of colonic polyps 01/06/2007   Ruben Im, PT 07/22/18 12:19 PM Phone: [phone] Fax: [phone] Alvera Singh 07/22/2018, 12:18 PM   [facility] [address] [place], [place], [address] Phone: [phone]   Fax:  [phone]  Name: Benjamin Cardenas MRN: [id number] Date of Birth: 1929-08-25

## 2018-07-25 ENCOUNTER — Encounter: Payer: Medicare Other | Admitting: Physical Therapy

## 2018-07-27 ENCOUNTER — Encounter: Payer: Medicare Other | Admitting: Physical Therapy

## 2018-07-28 ENCOUNTER — Ambulatory Visit: Payer: Medicare Other | Admitting: Physical Therapy

## 2018-07-28 ENCOUNTER — Encounter: Payer: Self-pay | Admitting: Physical Therapy

## 2018-07-28 DIAGNOSIS — G8929 Other chronic pain: Secondary | ICD-10-CM | POA: Diagnosis not present

## 2018-07-28 DIAGNOSIS — R293 Abnormal posture: Secondary | ICD-10-CM

## 2018-07-28 DIAGNOSIS — M545 Low back pain: Secondary | ICD-10-CM | POA: Diagnosis not present

## 2018-07-28 DIAGNOSIS — M6281 Muscle weakness (generalized): Secondary | ICD-10-CM

## 2018-07-28 NOTE — Therapy (Signed)
[facility] [address] [place], [place], [address] Phone: [phone]   Fax:  [phone]  Physical Therapy Treatment  Patient Details  Name: Benjamin Cardenas MRN: [id number] Date of Birth: 23-Mar-1930 Referring Provider (PT): Isaac Bliss, Rayford Halsted, MD   Encounter Date: 07/28/2018  PT End of Session - 07/28/18 1055    Visit Number  4    Date for PT Re-Evaluation  09/02/18    Authorization Type  Medicare [employer]    PT Start Time  1056    PT Stop Time  1140    PT Time Calculation (min)  44 min    Activity Tolerance  Patient tolerated treatment well       Past Medical History:  Diagnosis Date  . Bladder cancer [facility]) urology-- dr Karsten Ro   dx 11/ 2016  Transitional cell carcinoma, High grade invasive , Stage T1  G3 ---  s/p  TURBT and Mitomycin C instillation  . BPH with urinary obstruction   . Dry skin dermatitis   . Elevated PSA   . History of colon polyps   . Hydronephrosis, left   . Lower urinary tract symptoms (LUTS)   . OA (osteoarthritis)   . Prostate nodule   . Seasonal allergic rhinitis   . Wears glasses     Past Surgical History:  Procedure Laterality Date  . cataract surgery    . CYSTOSCOPY WITH BIOPSY N/A 04/22/2015   Procedure: CYSTOSCOPY WITH BLADDER BIOPSY;  Surgeon: Kathie Rhodes, MD;  Location: [facility];  Service: Urology;  Laterality: N/A;  . CYSTOSCOPY WITH RETROGRADE PYELOGRAM, URETEROSCOPY AND STENT PLACEMENT Left 02/25/2015   Procedure: LEFT RETROGRADE PYELOGRAM, URETEROSCOPY ;  Surgeon: Kathie Rhodes, MD;  Location: [facility];  Service: Urology;  Laterality: Left;  . HERNIA REPAIR  age 85 1937   rupture right inguinal hernia  . INGUINAL HERNIA REPAIR  03/02/2011   Procedure: HERNIA REPAIR INGUINAL ADULT;  Surgeon: Earnstine Regal, MD;  Location: [facility];  Service: General;  Laterality: Left;  with mesh   . TRANSURETHRAL RESECTION OF BLADDER TUMOR WITH  GYRUS (TURBT-GYRUS) N/A 02/25/2015   Procedure: TRANSURETHRAL RESECTION OF BLADDER TUMOR WITH GYRUS (TURBT-GYRUS);  Surgeon: Kathie Rhodes, MD;  Location: [facility];  Service: Urology;  Laterality: N/A;    There were no vitals filed for this visit.  Subjective Assessment - 07/28/18 1100    Subjective  I'm fair today.  Some pain with sitting but will increase to a lot of pain with standing.  States he felt better after last time.  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  That keeps me busy.      Pertinent History  bladder cancer, in remission. Pt with current lower central and right sided abdominal/groin pain. Has been to MD and r/o hernia and bladder cancer recurrence.    Currently in Pain?  Yes    Pain Score  6     Pain Location  Back    Pain Type  Chronic pain                       [facility] Adult PT Treatment/Exercise - 07/28/18 0001      Lumbar Exercises: Aerobic   Nustep  L2 8 min       Lumbar Exercises: Standing   Other Standing Lumbar Exercises  wall climbs UE/LE same side 10x  Other Standing Lumbar Exercises  hip extension isometric 5x hold 10x      Lumbar Exercises: Seated   Other Seated Lumbar Exercises  foam roll push down to activate transverse abdominals      Lumbar Exercises: Supine   Other Supine Lumbar Exercises  green ball hip/knee flexion 5x    Other Supine Lumbar Exercises  green ball lumbar rotation 5x each way       Knee/Hip Exercises: Seated   Sit to Sand  10 reps      Manual Therapy   Manual therapy comments  left and right sidelying grade 1/2 "smile" oscillations to gap 30x    Joint Mobilization  long leg distraction oscillations 3-4 min each leg grade 1/2    Soft tissue mobilization  Addaday yellow attachment to right gluteals, quad, HS             PT Education - 07/28/18 1204    Education Details   Access Code: [email or web address] abdominal brace seated, sit to stand, wall climbs     Person(s) Educated  Patient    Methods  Explanation;Demonstration;Handout    Comprehension  Returned demonstration;Verbalized understanding       PT Short Term Goals - 07/22/18 1214      PT SHORT TERM GOAL #1   Title  Pt will be I in initial HEP for gentle ROM, stretching and postural strength.    Status  Achieved      PT SHORT TERM GOAL #2   Title  Pt will report overall reduction in pain throughout the day by at least 20%    Time  4    Period  Weeks    Status  On-going    Target Date  08/12/18      PT SHORT TERM GOAL #3   Title  Pt will be able to demo proper seated and standing postures to reduce strain on lumbar spine.    Status  Achieved    Target Date  08/12/18      PT SHORT TERM GOAL #4   Title  Pt will be able to perform 5x sit to stand in </= 18 sec with proper use of core to eliminate pain with performance.    Time  3    Period  Weeks    Status  New    Target Date  08/12/18        PT Long Term Goals - 07/28/18 1209      PT LONG TERM GOAL #1   Title  Pt will be I in advanced HEP and understand safety in progression of exercise at his gym.    Time  6    Period  Weeks    Status  On-going      PT LONG TERM GOAL #2   Title  Pt will achieve strength rating throughout bil LEs to at least 4+/5 to improve functional tasks such as stairs and sit to stand with less strain across knees and spinal joints.    Time  6    Period  Weeks    Status  On-going      PT LONG TERM GOAL #3   Title  Pt will reduce FOTO to </= 40% to demo less limitation in daily tasks.    Time  6    Period  Weeks    Status  On-going      PT LONG TERM GOAL #4   Title  Pt will be able to perform standing activities for  at least 30 min with pain </= 3/10 to improve participation in church and other community activities.    Time  6    Period  Weeks    Status  On-going      PT LONG TERM GOAL #5   Title  Pt will be able to sit for at least 30 min with pain </= 3/10 to improve tolerance of  activities such as driving/travel.    Time  6    Period  Weeks    Status  On-going            Plan - 07/28/18 1056    Clinical Impression Statement  The patient reports continued right LE pain from hip to knee particularly with standing.  He does report decreased pain following treatment session especially with instrument assisted soft tissue mobilization using the Addaday.  Therapist closely monitoring response with all treatment interventions and modifying as needed.      Personal Factors and Comorbidities  Age;Comorbidity 1;Comorbidity 2    Comorbidities  history of bladder cancer, memory trouble (fogginess since CA treatment per wife)    Examination-Activity Limitations  Locomotion Level;Bend;Sit;Squat;Stand;Lift    Examination-Participation Restrictions  Community Activity;Church    Rehab Potential  Good    PT Frequency  2x / week    PT Duration  6 weeks    PT Treatment/Interventions  ADLs/Self Care Home Management;Aquatic Therapy;Cryotherapy;Electrical Stimulation;Moist Heat;Iontophoresis 4mg /ml Dexamethasone;Traction;Gait training;Stair training;Functional mobility training;Therapeutic activities;Therapeutic exercise;Balance training;Neuromuscular re-education;Patient/family education;Manual techniques;Passive range of motion;Dry needling;Taping;Spinal Manipulations;Joint Manipulations    PT Next Visit Plan  add to  HEP, continue ROM/Stretching and postural re-ed/strength; Nu-Step;  check 5x sit to stand for STG;  Check % improvement    PT Home Exercise Plan  Access Code: [email or web address]       Patient will benefit from skilled therapeutic intervention in order to improve the following deficits and impairments:  Abnormal gait, Decreased range of motion, Difficulty walking, Cardiopulmonary status limiting activity, Decreased endurance, Decreased activity tolerance, Pain, Decreased balance, Hypomobility, Impaired flexibility, Improper body mechanics, Decreased cognition, Decreased mobility,  Decreased strength, Postural dysfunction  Visit Diagnosis: Chronic bilateral low back pain without sciatica  Abnormal posture  Muscle weakness (generalized)     Problem List Patient Active Problem List   Diagnosis Date Noted  . MRSA (methicillin resistant Staphylococcus aureus) carrier 05/30/2018  . Bladder cancer ([place]) 03/10/2018  . CAP (community acquired pneumonia) 06/26/2016  . Hyponatremia 12/13/2015  . Anemia 12/13/2015  . Altered mental status 12/13/2015  . Constipation 08/17/2015  . Memory impairment 12/04/2014  . Keratosis pilaris 02/25/2011  . 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 Inguinal hernia unilateral, non-recurrent, left 01/28/2011  . OTHER SYMPTOMS INVOLVING CARDIOVASCULAR SYSTEM 09/20/2009  . Osteoarthritis 01/31/2009  . SCIATICA, ACUTE 12/20/2007  . NECK PAIN 02/15/2007  . BPH (benign prostatic hyperplasia) 01/06/2007  . History of colonic polyps 01/06/2007   Ruben Im, PT 07/28/18 12:10 PM Phone: [phone] Fax: [phone] Alvera Singh 07/28/2018, 12:10 PM  [facility] [address], [place], [place], [address] Phone: [phone]   Fax:  [phone]  Name: Jammie Clink MRN: [id number] Date of Birth: 1929/08/30

## 2018-07-28 NOTE — Patient Instructions (Signed)
Access Code: [email or web address]  URL: [email or web address]  Date: 07/28/2018  Prepared by: Ruben Im   Exercises  Hooklying Single Knee to Chest - 10 reps - 2 sets - 1x daily - 7x weekly  Supine Double Knee to Chest - 10 reps - 2 sets - 1x daily - 7x weekly  Supine Lower Trunk Rotation - 10 reps - 2 sets - 1x daily - 7x weekly  Seated Hamstring Stretch - 2 reps - 2 sets - 20 hold - 1x daily - 7x weekly  Side to Side Weight Shift with Counter Support - 10 reps - 2 sets - 1x daily - 7x weekly  Hooklying Isometric Hip Flexion - 5 reps - 2 sets - 5 hold - 1x daily - 7x weekly  Hooklying Gluteal Sets - 10 reps - 2 sets - 5 hold - 1x daily - 7x weekly  Seated Shoulder Horizontal Abduction with Resistance - Thumbs Up - 10 reps - 2 sets - 1x daily - 7x weekly  Seated Shoulder Row with Anchored Resistance - 10 reps - 2 sets - 1x daily - 7x weekly  Sit to Stand without Arm Support - 10 reps - 1 sets - 1x daily - 7x weekly  Seated Abdominal Press into [employer] - 10 reps - 1 sets - 1x daily - 7x weekly  Scapular Wall Slides - 10 reps - 1 sets - 1x daily - 7x weekly

## 2018-08-01 ENCOUNTER — Encounter: Payer: Medicare Other | Admitting: Physical Therapy

## 2018-08-02 ENCOUNTER — Ambulatory Visit: Payer: Medicare Other | Admitting: Physical Therapy

## 2018-08-02 ENCOUNTER — Encounter: Payer: Self-pay | Admitting: Physical Therapy

## 2018-08-02 ENCOUNTER — Other Ambulatory Visit: Payer: Self-pay

## 2018-08-02 DIAGNOSIS — R293 Abnormal posture: Secondary | ICD-10-CM

## 2018-08-02 DIAGNOSIS — M6281 Muscle weakness (generalized): Secondary | ICD-10-CM

## 2018-08-02 DIAGNOSIS — M545 Low back pain: Secondary | ICD-10-CM | POA: Diagnosis not present

## 2018-08-02 DIAGNOSIS — G8929 Other chronic pain: Secondary | ICD-10-CM | POA: Diagnosis not present

## 2018-08-02 NOTE — Therapy (Signed)
[facility] [address] [place], [place], [address] Phone: [phone]   Fax:  [phone]  Physical Therapy Treatment  Patient Details  Name: Benjamin Cardenas MRN: [id number] Date of Birth: 07/07/1929 Referring Provider (PT): Isaac Bliss, Rayford Halsted, MD   Encounter Date: 08/02/2018  PT End of Session - 08/02/18 1152    Visit Number  5    Date for PT Re-Evaluation  09/02/18    Authorization Type  Medicare [employer]    PT Start Time  1100    PT Stop Time  1145    PT Time Calculation (min)  45 min    Activity Tolerance  Patient tolerated treatment well       Past Medical History:  Diagnosis Date  . Bladder cancer [facility]) urology-- dr Karsten Ro   dx 11/ 2016  Transitional cell carcinoma, High grade invasive , Stage T1  G3 ---  s/p  TURBT and Mitomycin C instillation  . BPH with urinary obstruction   . Dry skin dermatitis   . Elevated PSA   . History of colon polyps   . Hydronephrosis, left   . Lower urinary tract symptoms (LUTS)   . OA (osteoarthritis)   . Prostate nodule   . Seasonal allergic rhinitis   . Wears glasses     Past Surgical History:  Procedure Laterality Date  . cataract surgery    . CYSTOSCOPY WITH BIOPSY N/A 04/22/2015   Procedure: CYSTOSCOPY WITH BLADDER BIOPSY;  Surgeon: Kathie Rhodes, MD;  Location: [facility];  Service: Urology;  Laterality: N/A;  . CYSTOSCOPY WITH RETROGRADE PYELOGRAM, URETEROSCOPY AND STENT PLACEMENT Left 02/25/2015   Procedure: LEFT RETROGRADE PYELOGRAM, URETEROSCOPY ;  Surgeon: Kathie Rhodes, MD;  Location: [facility];  Service: Urology;  Laterality: Left;  . HERNIA REPAIR  age 5 1937   rupture right inguinal hernia  . INGUINAL HERNIA REPAIR  03/02/2011   Procedure: HERNIA REPAIR INGUINAL ADULT;  Surgeon: Earnstine Regal, MD;  Location: [facility];  Service: General;  Laterality: Left;  with mesh   . TRANSURETHRAL RESECTION OF BLADDER TUMOR WITH  GYRUS (TURBT-GYRUS) N/A 02/25/2015   Procedure: TRANSURETHRAL RESECTION OF BLADDER TUMOR WITH GYRUS (TURBT-GYRUS);  Surgeon: Kathie Rhodes, MD;  Location: [facility];  Service: Urology;  Laterality: N/A;    There were no vitals filed for this visit.  Subjective Assessment - 08/02/18 1056    Subjective  The pain comes back.  I do some things at home but I'm so busy as [employer].  I feel better after therapy though.  I did some of the new ones.      Pertinent History  bladder cancer, in remission. Pt with current lower central and right sided abdominal/groin pain. Has been to MD and r/o hernia and bladder cancer recurrence.    How long can you sit comfortably?  40 min     How long can you walk comfortably?  15 min    Patient Stated Goals  routine to gym routine, get rid of pain, be able to stand longer with less pain for church    Currently in Pain?  Yes    Pain Score  6     Pain Location  Back    Pain Orientation  Right    Pain Type  Chronic pain    Aggravating Factors   standing, walking    Pain Relieving Factors  generally pretty comfortable sitting but after a  while it would ache 40 min         [facility] PT Assessment - 08/02/18 0001      Standardized Balance Assessment   Five times sit to stand comments   18 sec no UE support needed                    [facility] Adult PT Treatment/Exercise - 08/02/18 0001      Lumbar Exercises: Stretches   Other Lumbar Stretch Exercise  2nd step hip flexor stretch with and without UE use 2x5       Lumbar Exercises: Aerobic   Nustep  L2 10 min       Lumbar Exercises: Machines for Strengthening   Leg Press  65# bil 15x;  40# single leg 15x each       Lumbar Exercises: Standing   Other Standing Lumbar Exercises  wall climbs UE/LE same side 10x 2     Other Standing Lumbar Exercises  hip extension isometric 5x hold 10x      Lumbar Exercises: Supine   Other Supine Lumbar Exercises  green ball hip/knee  flexion 5x    Other Supine Lumbar Exercises  green ball lumbar rotation 5x each way       Manual Therapy   Manual therapy comments  left and right sidelying grade 1/2 "smile" oscillations to gap 30x    Joint Mobilization  long leg distraction oscillations 3-4 min each leg grade 1/2    Soft tissue mobilization  Addaday yellow attachment to right gluteals, quad, HS               PT Short Term Goals - 08/02/18 1156      PT SHORT TERM GOAL #1   Title  Pt will be I in initial HEP for gentle ROM, stretching and postural strength.    Status  Achieved      PT SHORT TERM GOAL #2   Title  Pt will report overall reduction in pain throughout the day by at least 20%    Status  Achieved      PT SHORT TERM GOAL #3   Title  Pt will be able to demo proper seated and standing postures to reduce strain on lumbar spine.    Status  Achieved      PT SHORT TERM GOAL #4   Title  Pt will be able to perform 5x sit to stand in </= 18 sec with proper use of core to eliminate pain with performance.    Status  Achieved        PT Long Term Goals - 08/02/18 1157      PT LONG TERM GOAL #1   Title  Pt will be I in advanced HEP and understand safety in progression of exercise at his gym.    Time  6    Period  Weeks    Status  On-going      PT LONG TERM GOAL #2   Title  Pt will achieve strength rating throughout bil LEs to at least 4+/5 to improve functional tasks such as stairs and sit to stand with less strain across knees and spinal joints.    Time  6    Period  Weeks    Status  On-going      PT LONG TERM GOAL #3   Title  Pt will reduce FOTO to </= 40% to demo less limitation in daily tasks.    Time  6  Period  Weeks    Status  On-going      PT LONG TERM GOAL #4   Title  Pt will be able to perform standing activities for at least 30 min with pain </= 3/10 to improve participation in church and other community activities.    Period  Weeks    Status  On-going      PT LONG TERM GOAL #5    Title  Pt will be able to sit for at least 30 min with pain </= 3/10 to improve tolerance of activities such as driving/travel.    Time  6    Period  Weeks    Status  On-going            Plan - 08/02/18 1152    Clinical Impression Statement  The patient continues to complain of right lumbar region pain and right anterior and posterior thigh pain which increases with standing and walking. He reports a  modest overall improvement and states he feels better during and following PT but the pain returns.   He is able to progress with strengthening including the standing position.  Improved sit to stand timed test.  Therapist closely monitoring response and monitoring as needed.  All STGs met.      Stability/Clinical Decision Making  Evolving/Moderate complexity    Rehab Potential  Good    PT Frequency  2x / week    PT Duration  6 weeks    PT Treatment/Interventions  ADLs/Self Care Home Management;Aquatic Therapy;Cryotherapy;Electrical Stimulation;Moist Heat;Iontophoresis 86m/ml Dexamethasone;Traction;Gait training;Stair training;Functional mobility training;Therapeutic activities;Therapeutic exercise;Balance training;Neuromuscular re-education;Patient/family education;Manual techniques;Passive range of motion;Dry needling;Taping;Spinal Manipulations;Joint Manipulations    PT Next Visit Plan  progressive strength; Nu-Step,  manual therapy for pain relief        Patient will benefit from skilled therapeutic intervention in order to improve the following deficits and impairments:  Abnormal gait, Decreased range of motion, Difficulty walking, Cardiopulmonary status limiting activity, Decreased endurance, Decreased activity tolerance, Pain, Decreased balance, Hypomobility, Impaired flexibility, Improper body mechanics, Decreased cognition, Decreased mobility, Decreased strength, Postural dysfunction  Visit Diagnosis: Chronic bilateral low back pain without sciatica  Abnormal posture  Muscle  weakness (generalized)     Problem List Patient Active Problem List   Diagnosis Date Noted  . MRSA (methicillin resistant Staphylococcus aureus) carrier 05/30/2018  . [place]  . CAP (community acquired pneumonia) 06/26/2016  . Hyponatremia 12/13/2015  . Anemia 12/13/2015  . Altered mental status 12/13/2015  . Constipation 08/17/2015  . Memory impairment 12/04/2014  . Keratosis pilaris 02/25/2011  . 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 Inguinal hernia unilateral, non-recurrent, left 01/28/2011  . OTHER SYMPTOMS INVOLVING CARDIOVASCULAR SYSTEM 09/20/2009  . Osteoarthritis 01/31/2009  . SCIATICA, ACUTE 12/20/2007  . NECK PAIN 02/15/2007  . BPH (benign prostatic hyperplasia) 01/06/2007  . History of colonic polyps 01/06/2007   SRuben Im PT 08/02/18 11:59 AM Phone: [phone]: [phone] SAlvera Singh4/28/2020, 11:58 AM  [facility] [address] [place] [address]Phone: [phone]  Fax:  [phone] Name: SKapena HammeMRN: [id number] of Birth: [birth year]

## 2018-08-03 ENCOUNTER — Encounter: Payer: Medicare Other | Admitting: Physical Therapy

## 2018-08-08 ENCOUNTER — Encounter: Payer: Self-pay | Admitting: Podiatry

## 2018-08-08 ENCOUNTER — Ambulatory Visit (INDEPENDENT_AMBULATORY_CARE_PROVIDER_SITE_OTHER): Payer: Medicare Other | Admitting: Podiatry

## 2018-08-08 ENCOUNTER — Other Ambulatory Visit: Payer: Self-pay

## 2018-08-08 ENCOUNTER — Encounter: Payer: Medicare Other | Admitting: Physical Therapy

## 2018-08-08 VITALS — Temp 97.3°F

## 2018-08-08 DIAGNOSIS — L84 Corns and callosities: Secondary | ICD-10-CM | POA: Diagnosis not present

## 2018-08-08 DIAGNOSIS — G629 Polyneuropathy, unspecified: Secondary | ICD-10-CM

## 2018-08-08 DIAGNOSIS — M79674 Pain in right toe(s): Secondary | ICD-10-CM

## 2018-08-08 DIAGNOSIS — M79675 Pain in left toe(s): Secondary | ICD-10-CM

## 2018-08-08 DIAGNOSIS — B351 Tinea unguium: Secondary | ICD-10-CM

## 2018-08-08 NOTE — Patient Instructions (Addendum)
Corns and Calluses Corns are small areas of thickened skin that occur on the top, sides, or tip of a toe. They contain a cone-shaped core with a point that can press on a nerve below. This causes pain.  Calluses are areas of thickened skin that can occur anywhere on the body, including the hands, fingers, palms, soles of the feet, and heels. Calluses are usually larger than corns. What are the causes? Corns and calluses are caused by rubbing (friction) or pressure, such as from shoes that are too tight or do not fit properly. What increases the risk? Corns are more likely to develop in people who have misshapen toes (toe deformities), such as hammer toes. Calluses can occur with friction to any area of the skin. They are more likely to develop in people who:  Work with their hands.  Wear shoes that fit poorly, are too tight, or are high-heeled.  Have toe deformities. What are the signs or symptoms? Symptoms of a corn or callus include:  A hard growth on the skin.  Pain or tenderness under the skin.  Redness and swelling.  Increased discomfort while wearing tight-fitting shoes, if your feet are affected. If a corn or callus becomes infected, symptoms may include:  Redness and swelling that gets worse.  Pain.  Fluid, blood, or pus draining from the corn or callus. How is this diagnosed? Corns and calluses may be diagnosed based on your symptoms, your medical history, and a physical exam. How is this treated? Treatment for corns and calluses may include:  Removing the cause of the friction or pressure. This may involve: ? Changing your shoes. ? Wearing shoe inserts (orthotics) or other protective layers in your shoes, such as a corn pad. ? Wearing gloves.  Applying medicine to the skin (topical medicine) to help soften skin in the hardened, thickened areas.  Removing layers of dead skin with a file to reduce the size of the corn or callus.  Removing the corn or callus with a  scalpel or laser.  Taking antibiotic medicines, if your corn or callus is infected.  Having surgery, if a toe deformity is the cause. Follow these instructions at home:   Take over-the-counter and prescription medicines only as told by your health care provider.  If you were prescribed an antibiotic, take it as told by your health care provider. Do not stop taking it even if your condition starts to improve.  Wear shoes that fit well. Avoid wearing high-heeled shoes and shoes that are too tight or too loose.  Wear any padding, protective layers, gloves, or orthotics as told by your health care provider.  Soak your hands or feet and then use a file or pumice stone to soften your corn or callus. Do this as told by your health care provider.  Check your corn or callus every day for symptoms of infection. Contact a health care provider if you:  Notice that your symptoms do not improve with treatment.  Have redness or swelling that gets worse.  Notice that your corn or callus becomes painful.  Have fluid, blood, or pus coming from your corn or callus.  Have new symptoms. Summary  Corns are small areas of thickened skin that occur on the top, sides, or tip of a toe.  Calluses are areas of thickened skin that can occur anywhere on the body, including the hands, fingers, palms, and soles of the feet. Calluses are usually larger than corns.  Corns and calluses are caused by   rubbing (friction) or pressure, such as from shoes that are too tight or do not fit properly.  Treatment may include wearing any padding, protective layers, gloves, or orthotics as told by your health care provider. This information is not intended to replace advice given to you by your health care provider. Make sure you discuss any questions you have with your health care provider. Document Released: 12/28/2003 Document Revised: 02/03/2017 Document Reviewed: 02/03/2017 Elsevier Interactive Patient Education   2019 [employer] Onychomycosis/Fungal Toenails  WHAT IS IT? An infection that lies within the keratin of your nail plate that is caused by a fungus.  WHY ME? Fungal infections affect all ages, sexes, races, and creeds.  There may be many factors that predispose you to a fungal infection such as age, coexisting medical conditions such as diabetes, or an autoimmune disease; stress, medications, fatigue, genetics, etc.  Bottom line: fungus thrives in a warm, moist environment and your shoes offer such a location.  IS IT CONTAGIOUS? Theoretically, yes.  You do not want to share shoes, nail clippers or files with someone who has fungal toenails.  Walking around barefoot in the same room or sleeping in the same bed is unlikely to transfer the organism.  It is important to realize, however, that fungus can spread easily from one nail to the next on the same foot.  HOW DO WE TREAT THIS?  There are several ways to treat this condition.  Treatment may depend on many factors such as age, medications, pregnancy, liver and kidney conditions, etc.  It is best to ask your doctor which options are available to you.  No treatment.   Unlike many other medical concerns, you can live with this condition.  However for many people this can be a painful condition and may lead to ingrown toenails or a bacterial infection.  It is recommended that you keep the nails cut short to help reduce the amount of fungal nail. Topical treatment.  These range from herbal remedies to prescription strength nail lacquers.  About 40-50% effective, topicals require twice daily application for approximately 9 to 12 months or until an entirely new nail has grown out.  The most effective topicals are medical grade medications available through physicians offices. Oral antifungal medications.  With an 80-90% cure rate, the most common oral medication requires 3 to 4 months of therapy and stays in your system for a year as the new nail grows out.   Oral antifungal medications do require blood work to make sure it is a safe drug for you.  A liver function panel will be performed prior to starting the medication and after the first month of treatment.  It is important to have the blood work performed to avoid any harmful side effects.  In general, this medication safe but blood work is required. Laser Therapy.  This treatment is performed by applying a specialized laser to the affected nail plate.  This therapy is noninvasive, fast, and non-painful.  It is not covered by insurance and is therefore, out of pocket.  The results have been very good with a 80-95% cure rate.  [facility] is the only practice in the area to offer this therapy. Permanent Nail Avulsion.  Removing the entire nail so that a new nail will not grow back.

## 2018-08-09 ENCOUNTER — Encounter: Payer: Medicare Other | Admitting: Physical Therapy

## 2018-08-10 ENCOUNTER — Encounter: Payer: Medicare Other | Admitting: Physical Therapy

## 2018-08-11 ENCOUNTER — Encounter: Payer: Self-pay | Admitting: Podiatry

## 2018-08-11 ENCOUNTER — Other Ambulatory Visit: Payer: Self-pay

## 2018-08-11 ENCOUNTER — Ambulatory Visit: Payer: Medicare Other | Attending: Internal Medicine | Admitting: Physical Therapy

## 2018-08-11 ENCOUNTER — Encounter: Payer: Self-pay | Admitting: Physical Therapy

## 2018-08-11 DIAGNOSIS — R293 Abnormal posture: Secondary | ICD-10-CM | POA: Insufficient documentation

## 2018-08-11 DIAGNOSIS — G8929 Other chronic pain: Secondary | ICD-10-CM

## 2018-08-11 DIAGNOSIS — M545 Low back pain, unspecified: Secondary | ICD-10-CM

## 2018-08-11 DIAGNOSIS — M6281 Muscle weakness (generalized): Secondary | ICD-10-CM | POA: Insufficient documentation

## 2018-08-11 NOTE — Therapy (Addendum)
[facility] [address], [place], [place], [address] Phone: [phone]   Fax:  [phone]  Physical Therapy Treatment/Discharge Summary   Patient Details  Name: Benjamin Cardenas MRN: [id number] Date of Birth: December 04, 1929 Referring Provider (PT): Isaac Bliss, Rayford Halsted, MD   Encounter Date: 08/11/2018  PT End of Session - 08/11/18 1130    Visit Number  6    Date for PT Re-Evaluation  09/02/18    Authorization Type  Medicare [employer]    PT Start Time  1053    PT Stop Time  1138    PT Time Calculation (min)  45 min    Activity Tolerance  Patient tolerated treatment well       Past Medical History:  Diagnosis Date  . Bladder cancer [facility]) urology-- dr Karsten Ro   dx 11/ 2016  Transitional cell carcinoma, High grade invasive , Stage T1  G3 ---  s/p  TURBT and Mitomycin C instillation  . BPH with urinary obstruction   . Dry skin dermatitis   . Elevated PSA   . History of colon polyps   . Hydronephrosis, left   . Lower urinary tract symptoms (LUTS)   . OA (osteoarthritis)   . Prostate nodule   . Seasonal allergic rhinitis   . Wears glasses     Past Surgical History:  Procedure Laterality Date  . cataract surgery    . CYSTOSCOPY WITH BIOPSY N/A 04/22/2015   Procedure: CYSTOSCOPY WITH BLADDER BIOPSY;  Surgeon: Kathie Rhodes, MD;  Location: [facility];  Service: Urology;  Laterality: N/A;  . CYSTOSCOPY WITH RETROGRADE PYELOGRAM, URETEROSCOPY AND STENT PLACEMENT Left 02/25/2015   Procedure: LEFT RETROGRADE PYELOGRAM, URETEROSCOPY ;  Surgeon: Kathie Rhodes, MD;  Location: [facility];  Service: Urology;  Laterality: Left;  . HERNIA REPAIR  age 85 1937   rupture right inguinal hernia  . INGUINAL HERNIA REPAIR  03/02/2011   Procedure: HERNIA REPAIR INGUINAL ADULT;  Surgeon: Earnstine Regal, MD;  Location: [facility];  Service: General;  Laterality: Left;  with mesh   . TRANSURETHRAL RESECTION OF  BLADDER TUMOR WITH GYRUS (TURBT-GYRUS) N/A 02/25/2015   Procedure: TRANSURETHRAL RESECTION OF BLADDER TUMOR WITH GYRUS (TURBT-GYRUS);  Surgeon: Kathie Rhodes, MD;  Location: [facility];  Service: Urology;  Laterality: N/A;    There were no vitals filed for this visit.  Subjective Assessment - 08/11/18 1053    Subjective  I did do some of the ex's but not as much as I should.  I did "good" after last time.  It feels better for 3 hours but then the pain comes back.  I think (manual) therapy helps a lot.   Standing hurts my right knee and also the lower back.      Pertinent History  bladder cancer, in remission. Pt with current lower central and right sided abdominal/groin pain. Has been to MD and r/o hernia and bladder cancer recurrence.    How long can you sit comfortably?  40 min     How long can you stand comfortably?  15    How long can you walk comfortably?  15 min    Patient Stated Goals  routine to gym routine, get rid of pain, be able to stand longer with less pain for church    Currently in Pain?  Yes    Pain Score  6     Pain Location  Back  [facility] Treatment/Exercise - 08/11/18 0001      Lumbar Exercises: Stretches   Other Lumbar Stretch Exercise  2nd step hip flexor stretch with and without UE use 2x5       Lumbar Exercises: Aerobic   Nustep  L2 10 min       Lumbar Exercises: Machines for Strengthening   Leg Press  --    Other Lumbar Machine Exercise  seated row 20# 2x10    Other Lumbar Machine Exercise  lat bar 20# 2x10      Lumbar Exercises: Standing   Other Standing Lumbar Exercises  step ups 2x 30 sec     Other Standing Lumbar Exercises  hip hinge 10x; 8# hip hinge from knee level 8x      Manual Therapy   Manual therapy comments  left and right sidelying grade 1/2 "smile" oscillations to gap 30x    Soft tissue mobilization  Addaday yellow attachment to right gluteals, quad, HS               PT  Short Term Goals - 08/02/18 1156      PT SHORT TERM GOAL #1   Title  Pt will be I in initial HEP for gentle ROM, stretching and postural strength.    Status  Achieved      PT SHORT TERM GOAL #2   Title  Pt will report overall reduction in pain throughout the day by at least 20%    Status  Achieved      PT SHORT TERM GOAL #3   Title  Pt will be able to demo proper seated and standing postures to reduce strain on lumbar spine.    Status  Achieved      PT SHORT TERM GOAL #4   Title  Pt will be able to perform 5x sit to stand in </= 18 sec with proper use of core to eliminate pain with performance.    Status  Achieved        PT Long Term Goals - 08/11/18 1200      PT LONG TERM GOAL #1   Title  Pt will be I in advanced HEP and understand safety in progression of exercise at his gym.    Time  6    Period  Weeks    Status  On-going      PT LONG TERM GOAL #2   Title  Pt will achieve strength rating throughout bil LEs to at least 4+/5 to improve functional tasks such as stairs and sit to stand with less strain across knees and spinal joints.    Time  6    Period  Weeks    Status  On-going      PT LONG TERM GOAL #3   Title  Pt will reduce FOTO to </= 40% to demo less limitation in daily tasks.    Time  6    Period  Weeks    Status  On-going      PT LONG TERM GOAL #4   Title  Pt will be able to perform standing activities for at least 30 min with pain </= 3/10 to improve participation in church and other community activities.    Time  6    Period  Weeks    Status  On-going      PT LONG TERM GOAL #5   Title  Pt will be able to sit for at least 30 min with pain </= 3/10 to improve  tolerance of activities such as driving/travel.    Time  6    Period  Weeks    Status  On-going            Plan - 08/11/18 1148    Clinical Impression Statement  The patient reports temporary relief from manual therapy however pain level returns to baseline level after a few hours.  Standing  increases LBP, partially relieved with sitting.  Will continue to progress strengthening with modifications to allow sitting between exercises for pain control.  Therapist closely monitoring response with all interventions and modifying position of ex as needed.      Comorbidities  history of bladder cancer, memory trouble (fogginess since CA treatment per wife)    Rehab Potential  Good    PT Frequency  2x / week    PT Duration  6 weeks    PT Treatment/Interventions  ADLs/Self Care Home Management;Aquatic Therapy;Cryotherapy;Electrical Stimulation;Moist Heat;Iontophoresis 41m/ml Dexamethasone;Traction;Gait training;Stair training;Functional mobility training;Therapeutic activities;Therapeutic exercise;Balance training;Neuromuscular re-education;Patient/family education;Manual techniques;Passive range of motion;Dry needling;Taping;Spinal Manipulations;Joint Manipulations    PT Next Visit Plan  progressive strength; Nu-Step,  manual therapy for pain relief    PT Home Exercise Plan  Access Code: [email or web address]      Patient will benefit from skilled therapeutic intervention in order to improve the following deficits and impairments:  Abnormal gait, Decreased range of motion, Difficulty walking, Cardiopulmonary status limiting activity, Decreased endurance, Decreased activity tolerance, Pain, Decreased balance, Hypomobility, Impaired flexibility, Improper body mechanics, Decreased cognition, Decreased mobility, Decreased strength, Postural dysfunction  Visit Diagnosis: Chronic bilateral low back pain without sciatica  Abnormal posture  Muscle weakness (generalized)    PHYSICAL THERAPY DISCHARGE SUMMARY  Visits from Start of Care: 6  Current functional level related to goals / functional outcomes: Patient was admitted to the hospital and then home with home health services.   Remaining deficits: As above   Education / Equipment: HEP  Plan: Patient agrees to discharge.  Patient goals were  partially met. Patient is being discharged due to a change in medical status.  ?????         Problem List Patient Active Problem List   Diagnosis Date Noted  . MRSA (methicillin resistant Staphylococcus aureus) carrier 05/30/2018  . Bladder cancer ([facility] 03/10/2018  . CAP (community acquired pneumonia) 06/26/2016  . Hyponatremia 12/13/2015  . Anemia 12/13/2015  . Altered mental status 12/13/2015  . Constipation 08/17/2015  . Memory impairment 12/04/2014  . Keratosis pilaris 02/25/2011  . 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 Inguinal hernia unilateral, non-recurrent, left 01/28/2011  . OTHER SYMPTOMS INVOLVING CARDIOVASCULAR SYSTEM 09/20/2009  . Osteoarthritis 01/31/2009  . SCIATICA, ACUTE 12/20/2007  . NECK PAIN 02/15/2007  . BPH (benign prostatic hyperplasia) 01/06/2007  . History of colonic polyps 01/06/2007   [facility] PT 08/11/18 12:02 PM Phone: [phone]: [phone] Singh5/10/2018, 12:01 PM  [facility] [address] [place] [address]Phone: [phone]  Fax:  [phone] Name: SUnnamed ZeienMRN: [id number] of Birth: [birth year]

## 2018-08-11 NOTE — Progress Notes (Signed)
Subjective: Benjamin Cardenas presents today h/o neuropathy for cc of elongated,  painful, discolored, thick toenails which interfere with daily activities and routine tasks.  Pain is aggravated when wearing enclosed shoe gear. Pain is getting progressively worse and relieved with periodic professional debridement. Patient also presents with cc of calluses b/l which require periodic professional debridement.  Isaac Bliss, Rayford Halsted, MD is her PCP. Last visit was 05/10/2018.   Current Outpatient Medications:  .  ALOE VERA JUICE LIQD, Take 30 mLs by mouth every morning., Disp: , Rfl:  .  aspirin EC 81 MG tablet, Take 81 mg by mouth at bedtime. , Disp: , Rfl:  .  Black Pepper-Turmeric (TURMERIC COMPLEX/BLACK PEPPER PO), Take 1 capsule by mouth daily., Disp: , Rfl:  .  cetirizine (ZYRTEC) 10 MG tablet, Take 10 mg by mouth daily as needed for allergies., Disp: , Rfl:  .  naphazoline-pheniramine (NAPHCON-A) 0.025-0.3 % ophthalmic solution, Place 1 drop into both eyes 2 (two) times daily., Disp: , Rfl:  .  protein supplement shake (PREMIER PROTEIN) LIQD, Take 2 oz by mouth daily. Add to PET butter pecan ice cream for calorie/protein supplement., Disp: , Rfl:  .  triamcinolone ointment (KENALOG) 0.1 %, Apply 1 application topically 2 (two) times daily. , Disp: , Rfl:  .  TRULANCE 3 MG TABS, Take 3 mg by mouth every morning. , Disp: , Rfl:  .  Wheat Dextrin (BENEFIBER DRINK MIX PO), Take 5 mLs by mouth 2 (two) times daily. , Disp: , Rfl:   Allergies  Allergen Reactions  . Amoxicillin Rash    Has patient had a PCN reaction causing immediate rash, facial/tongue/throat swelling, SOB or lightheadedness with hypotension:No Has patient had a PCN reaction causing severe rash involving mucus membranes or skin necrosis:unsure Has patient had a PCN reaction that required hospitalization:No Has patient had a PCN reaction occurring within the last 10 years:unsure If all of the above answers are "NO", then may  proceed with Cephalosporin use.   . Ciprofloxacin Swelling and Rash  . Sulfasalazine Swelling  . Sulfa Antibiotics Swelling  . Doxycycline Hyclate Rash  . Levofloxacin Rash    Objective:  Vascular Examination: Capillary refill time <3 seconds x 10 digits.  Dorsalis pedis pulses palpable b/l.  Posterior tibial pulses palpable b/l.  No digital hair x 10 digits.  Skin temperature WNL b/l.   Dermatological Examination: Skin with normal turgor, texture and tone b/l.  Toenails b/l great and 2nd digits discolored, thick, dystrophic with subungual debris and pain with palpation to nailbeds due to thickness of nails.  Porokeratotic lesion submet heads 1, 5 b/l There is visible subdermal hemorrhage submet head 5 left foot. No erythema, no edema, no drainage, no flocculence noted with either of the lesions.   Musculoskeletal: Muscle strength 5/5 to all LE muscle groups  Neurological: Sensation intact with 10 gram monofilament.  Vibratory sensation decreased b/l.  Assessment: 1. Painful onychomycosis toenails 1-5 b/l 2. Calluses submet heads 1, 5 b/l 3. Peripheral neuropathy  Plan: 1. Discuss diabetic foot care principles. Literature dispensed. 2. Toenails 1-5 b/l were debrided in length and girth without iatrogenic bleeding. 3. Porokeratotic lesions pared submet heads 1, 5 b/l utilizing sterile scalpel blade without incident.  4. Patient to continue soft, supportive shoe gear daily. 5. Patient to report any pedal injuries to medical professional immediately. 6. Follow up 10 weeks. 7. Patient/POA to call should there be a concern in the interim.

## 2018-08-13 ENCOUNTER — Other Ambulatory Visit: Payer: Self-pay

## 2018-08-13 ENCOUNTER — Emergency Department (HOSPITAL_COMMUNITY): Payer: Medicare Other

## 2018-08-13 ENCOUNTER — Inpatient Hospital Stay (HOSPITAL_COMMUNITY)
Admission: EM | Admit: 2018-08-13 | Discharge: 2018-08-17 | DRG: 871 | Disposition: A | Discharge status: Home Health | Payer: Medicare Other | Attending: Internal Medicine | Admitting: Internal Medicine

## 2018-08-13 ENCOUNTER — Encounter (HOSPITAL_COMMUNITY): Payer: Self-pay | Admitting: *Deleted

## 2018-08-13 DIAGNOSIS — G9341 Metabolic encephalopathy: Secondary | ICD-10-CM | POA: Diagnosis present

## 2018-08-13 DIAGNOSIS — Z1159 Encounter for screening for other viral diseases: Secondary | ICD-10-CM

## 2018-08-13 DIAGNOSIS — N179 Acute kidney failure, unspecified: Secondary | ICD-10-CM | POA: Diagnosis present

## 2018-08-13 DIAGNOSIS — G9340 Encephalopathy, unspecified: Secondary | ICD-10-CM | POA: Diagnosis not present

## 2018-08-13 DIAGNOSIS — N136 Pyonephrosis: Secondary | ICD-10-CM | POA: Diagnosis not present

## 2018-08-13 DIAGNOSIS — Z8719 Personal history of other diseases of the digestive system: Secondary | ICD-10-CM

## 2018-08-13 DIAGNOSIS — N320 Bladder-neck obstruction: Secondary | ICD-10-CM | POA: Diagnosis not present

## 2018-08-13 DIAGNOSIS — N1330 Unspecified hydronephrosis: Secondary | ICD-10-CM | POA: Diagnosis not present

## 2018-08-13 DIAGNOSIS — Z881 Allergy status to other antibiotic agents status: Secondary | ICD-10-CM

## 2018-08-13 DIAGNOSIS — N183 Chronic kidney disease, stage 3 (moderate): Secondary | ICD-10-CM | POA: Diagnosis present

## 2018-08-13 DIAGNOSIS — N401 Enlarged prostate with lower urinary tract symptoms: Secondary | ICD-10-CM | POA: Diagnosis present

## 2018-08-13 DIAGNOSIS — Z79899 Other long term (current) drug therapy: Secondary | ICD-10-CM

## 2018-08-13 DIAGNOSIS — I714 Abdominal aortic aneurysm, without rupture, unspecified: Secondary | ICD-10-CM | POA: Diagnosis present

## 2018-08-13 DIAGNOSIS — M1990 Unspecified osteoarthritis, unspecified site: Secondary | ICD-10-CM | POA: Diagnosis present

## 2018-08-13 DIAGNOSIS — A419 Sepsis, unspecified organism: Secondary | ICD-10-CM | POA: Diagnosis not present

## 2018-08-13 DIAGNOSIS — N400 Enlarged prostate without lower urinary tract symptoms: Secondary | ICD-10-CM | POA: Diagnosis present

## 2018-08-13 DIAGNOSIS — R339 Retention of urine, unspecified: Secondary | ICD-10-CM | POA: Diagnosis not present

## 2018-08-13 DIAGNOSIS — C679 Malignant neoplasm of bladder, unspecified: Secondary | ICD-10-CM | POA: Diagnosis not present

## 2018-08-13 DIAGNOSIS — G8929 Other chronic pain: Secondary | ICD-10-CM | POA: Diagnosis present

## 2018-08-13 DIAGNOSIS — N3000 Acute cystitis without hematuria: Secondary | ICD-10-CM

## 2018-08-13 DIAGNOSIS — M5430 Sciatica, unspecified side: Secondary | ICD-10-CM | POA: Diagnosis present

## 2018-08-13 DIAGNOSIS — Z7982 Long term (current) use of aspirin: Secondary | ICD-10-CM

## 2018-08-13 DIAGNOSIS — Z882 Allergy status to sulfonamides status: Secondary | ICD-10-CM

## 2018-08-13 DIAGNOSIS — Z8551 Personal history of malignant neoplasm of bladder: Secondary | ICD-10-CM

## 2018-08-13 DIAGNOSIS — Z03818 Encounter for observation for suspected exposure to other biological agents ruled out: Secondary | ICD-10-CM | POA: Diagnosis not present

## 2018-08-13 DIAGNOSIS — Z880 Allergy status to penicillin: Secondary | ICD-10-CM

## 2018-08-13 DIAGNOSIS — R509 Fever, unspecified: Secondary | ICD-10-CM | POA: Diagnosis not present

## 2018-08-13 DIAGNOSIS — Z87891 Personal history of nicotine dependence: Secondary | ICD-10-CM

## 2018-08-13 DIAGNOSIS — R351 Nocturia: Secondary | ICD-10-CM

## 2018-08-13 DIAGNOSIS — N390 Urinary tract infection, site not specified: Secondary | ICD-10-CM | POA: Diagnosis not present

## 2018-08-13 DIAGNOSIS — K5909 Other constipation: Secondary | ICD-10-CM | POA: Diagnosis present

## 2018-08-13 DIAGNOSIS — Z8249 Family history of ischemic heart disease and other diseases of the circulatory system: Secondary | ICD-10-CM

## 2018-08-13 DIAGNOSIS — Z800 Family history of malignant neoplasm of digestive organs: Secondary | ICD-10-CM

## 2018-08-13 LAB — CBC WITH DIFFERENTIAL/PLATELET
Abs Immature Granulocytes: 0.06 10*3/uL (ref 0.00–0.07)
Basophils Absolute: 0 10*3/uL (ref 0.0–0.1)
Basophils Relative: 0 %
Eosinophils Absolute: 0.1 10*3/uL (ref 0.0–0.5)
Eosinophils Relative: 1 %
HCT: 33.8 % — ABNORMAL LOW (ref 39.0–52.0)
Hemoglobin: 10.7 g/dL — ABNORMAL LOW (ref 13.0–17.0)
Immature Granulocytes: 0 %
Lymphocytes Relative: 1 %
Lymphs Abs: 0.2 10*3/uL — ABNORMAL LOW (ref 0.7–4.0)
MCH: 30.5 pg (ref 26.0–34.0)
MCHC: 31.7 g/dL (ref 30.0–36.0)
MCV: 96.3 fL (ref 80.0–100.0)
Monocytes Absolute: 0.6 10*3/uL (ref 0.1–1.0)
Monocytes Relative: 3 %
Neutro Abs: 16.2 10*3/uL — ABNORMAL HIGH (ref 1.7–7.7)
Neutrophils Relative %: 95 %
Platelets: 238 10*3/uL (ref 150–400)
RBC: 3.51 MIL/uL — ABNORMAL LOW (ref 4.22–5.81)
RDW: 13.2 % (ref 11.5–15.5)
WBC: 17.2 10*3/uL — ABNORMAL HIGH (ref 4.0–10.5)
nRBC: 0 % (ref 0.0–0.2)

## 2018-08-13 LAB — URINALYSIS, ROUTINE W REFLEX MICROSCOPIC
Bilirubin Urine: NEGATIVE
Glucose, UA: NEGATIVE mg/dL
Ketones, ur: NEGATIVE mg/dL
Nitrite: NEGATIVE
Protein, ur: 30 mg/dL — AB
Specific Gravity, Urine: 1.011 (ref 1.005–1.030)
WBC, UA: >50 WBC/hpf — ABNORMAL HIGH (ref 0–5)
pH: 7 (ref 5.0–8.0)

## 2018-08-13 LAB — COMPREHENSIVE METABOLIC PANEL
ALT: 16 U/L (ref 0–44)
AST: 23 U/L (ref 15–41)
Albumin: 3.6 g/dL (ref 3.5–5.0)
Alkaline Phosphatase: 88 U/L (ref 38–126)
Anion gap: 10 (ref 5–15)
BUN: 57 mg/dL — ABNORMAL HIGH (ref 8–23)
CO2: 21 mmol/L — ABNORMAL LOW (ref 22–32)
Calcium: 8.8 mg/dL — ABNORMAL LOW (ref 8.9–10.3)
Chloride: 106 mmol/L (ref 98–111)
Creatinine, Ser: 2.29 mg/dL — ABNORMAL HIGH (ref 0.61–1.24)
GFR calc Af Amer: 29 mL/min — ABNORMAL LOW (ref >60)
GFR calc non Af Amer: 25 mL/min — ABNORMAL LOW (ref >60)
Glucose, Bld: 115 mg/dL — ABNORMAL HIGH (ref 70–99)
Potassium: 4.8 mmol/L (ref 3.5–5.1)
Sodium: 137 mmol/L (ref 135–145)
Total Bilirubin: 0.7 mg/dL (ref 0.3–1.2)
Total Protein: 6.9 g/dL (ref 6.5–8.1)

## 2018-08-13 LAB — SARS CORONAVIRUS 2 BY RT PCR (HOSPITAL ORDER, PERFORMED IN ~~LOC~~ HOSPITAL LAB): SARS Coronavirus 2: NEGATIVE

## 2018-08-13 LAB — LACTIC ACID, PLASMA: Lactic Acid, Venous: 0.8 mmol/L (ref 0.5–1.9)

## 2018-08-13 MED ORDER — ONDANSETRON HCL 4 MG/2ML IJ SOLN: 4.0000 mg | Freq: Four times a day (QID) | INTRAMUSCULAR | Status: DC | PRN

## 2018-08-13 MED ORDER — ACETAMINOPHEN 650 MG RE SUPP: 650.0000 mg | Freq: Four times a day (QID) | RECTAL | Status: DC | PRN

## 2018-08-13 MED ORDER — SODIUM CHLORIDE 0.9 % IV SOLN
1.0000 g | INTRAVENOUS | Status: DC
  Administered 2018-08-14 – 2018-08-16 (×3): 1 g via INTRAVENOUS
  Filled 2018-08-13: qty 10
  Filled 2018-08-13: qty 1
  Filled 2018-08-13: qty 10
  Filled 2018-08-13 (×2): qty 1

## 2018-08-13 MED ORDER — SODIUM CHLORIDE 0.9 % IV BOLUS (SEPSIS): 500.0000 mL | Freq: Once | INTRAVENOUS | Status: DC

## 2018-08-13 MED ORDER — POLYETHYLENE GLYCOL 3350 17 G PO PACK
17.0000 g | PACK | Freq: Every day | ORAL | Status: DC | PRN
  Administered 2018-08-17: 17 g via ORAL
  Filled 2018-08-13: qty 1

## 2018-08-13 MED ORDER — ONDANSETRON HCL 4 MG PO TABS: 4.0000 mg | ORAL_TABLET | Freq: Four times a day (QID) | ORAL | Status: DC | PRN

## 2018-08-13 MED ORDER — SODIUM CHLORIDE 0.9 % IV SOLN
1.0000 g | Freq: Once | INTRAVENOUS | Status: AC
  Administered 2018-08-13: 1 g via INTRAVENOUS
  Filled 2018-08-13: qty 10

## 2018-08-13 MED ORDER — HYDROMORPHONE HCL 1 MG/ML IJ SOLN: 0.5000 mg | Freq: Once | INTRAMUSCULAR | Status: DC

## 2018-08-13 MED ORDER — SODIUM CHLORIDE 0.9 % IV SOLN
INTRAVENOUS | Status: DC
  Administered 2018-08-13: 20:00:00 via INTRAVENOUS

## 2018-08-13 MED ORDER — BISACODYL 10 MG RE SUPP: 10.0000 mg | Freq: Every day | RECTAL | Status: DC | PRN

## 2018-08-13 MED ORDER — ACETAMINOPHEN 325 MG PO TABS
650.0000 mg | ORAL_TABLET | Freq: Once | ORAL | Status: AC
  Administered 2018-08-13: 650 mg via ORAL
  Filled 2018-08-13: qty 2

## 2018-08-13 MED ORDER — ACETAMINOPHEN 325 MG PO TABS
650.0000 mg | ORAL_TABLET | Freq: Four times a day (QID) | ORAL | Status: DC | PRN
  Administered 2018-08-14 – 2018-08-17 (×7): 650 mg via ORAL
  Filled 2018-08-13 (×7): qty 2

## 2018-08-13 MED ORDER — SODIUM CHLORIDE 0.9 % IV BOLUS
1000.0000 mL | Freq: Once | INTRAVENOUS | Status: AC
  Administered 2018-08-13: 1000 mL via INTRAVENOUS

## 2018-08-13 MED ORDER — FENTANYL CITRATE (PF) 100 MCG/2ML IJ SOLN
50.0000 ug | Freq: Once | INTRAMUSCULAR | Status: AC
  Administered 2018-08-13: 50 ug via INTRAVENOUS
  Filled 2018-08-13: qty 2

## 2018-08-13 MED ORDER — CHLORHEXIDINE GLUCONATE CLOTH 2 % EX PADS
6.0000 | MEDICATED_PAD | Freq: Every day | CUTANEOUS | Status: DC
  Administered 2018-08-14 – 2018-08-15 (×2): 6 via TOPICAL

## 2018-08-13 MED ORDER — SODIUM CHLORIDE 0.9 % IV BOLUS (SEPSIS)
1000.0000 mL | Freq: Once | INTRAVENOUS | Status: AC
  Administered 2018-08-13: 1000 mL via INTRAVENOUS

## 2018-08-13 NOTE — ED Triage Notes (Addendum)
Pt rambling in conversation, lower abd and back pain as well as "rt crotch" sounds like some of this is chronic. Pt does have fever in triage. Wife states he has history of bladder cancer dec 2016. She also states pt has been lethargic today and confused

## 2018-08-13 NOTE — ED Provider Notes (Signed)
Ben[facility] Carden[facility] DEPT Provider Note   CSN: [id number] Arrival date & time: 08/13/18  1831    History   Chief Complaint Chief Complaint  Patient presents with   Abdominal Pain    HPI Benjamin Cardenas is a 85 y.o. male.     HPI   88yM with fever/chills. History supplemented by wife at bedside. Describes rigors beginning this afternoon. Fatigue. Some lower abdominal pain. Chronic but feels like it may have been worsening over the past week or so. He has a past hx of bladder CA and is followed by Dr Karsten Ro. No dysuria but got up last night much more frequently than he normally does. No cough. No dyspnea.   Past Medical History:  Diagnosis Date   Bladder cancer [facility]) urology-- dr Karsten Ro   dx 11/ 2016  Transitional cell carcinoma, High grade invasive , Stage T1  G3 ---  s/p  TURBT and Mitomycin C instillation   BPH with urinary obstruction    Dry skin dermatitis    Elevated PSA    History of colon polyps    Hydronephrosis, left    Lower urinary tract symptoms (LUTS)    OA (osteoarthritis)    Prostate nodule    Seasonal allergic rhinitis    Wears glasses     Patient Active Problem List   Diagnosis Date Noted   Sepsis secondary to UTI ([place]) 08/13/2018   AKI (acute kidney injury) ([place]) 08/13/2018   Acute encephalopathy 08/13/2018   MRSA (methicillin resistant Staphylococcus aureus) carrier 05/30/2018   Bladder cancer ([place]) 03/10/2018   CAP (community acquired pneumonia) 06/26/2016   Hyponatremia 12/13/2015   Anemia 12/13/2015   Altered mental status 12/13/2015   Constipation 08/17/2015   Memory impairment 12/04/2014   Keratosis pilaris 62/22/9798   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   Inguinal hernia unilateral, non-recurrent, left 01/28/2011   OTHER SYMPTOMS INVOLVING CARDIOVASCULAR SYSTEM 09/20/2009   Osteoarthritis 01/31/2009   SCIATICA, ACUTE 12/20/2007   NECK PAIN 02/15/2007   BPH (benign prostatic  hyperplasia) 01/06/2007   History of colonic polyps 01/06/2007    Past Surgical History:  Procedure Laterality Date   cataract surgery     CYSTOSCOPY WITH BIOPSY N/A 04/22/2015   Procedure: CYSTOSCOPY WITH BLADDER BIOPSY;  Surgeon: Kathie Rhodes, MD;  Location: [place];  Service: Urology;  Laterality: N/A;   CYSTOSCOPY WITH RETROGRADE PYELOGRAM, URETEROSCOPY AND STENT PLACEMENT Left 02/25/2015   Procedure: LEFT RETROGRADE PYELOGRAM, URETEROSCOPY ;  Surgeon: Kathie Rhodes, MD;  Location: [facility];  Service: Urology;  Laterality: Left;   HERNIA REPAIR  age 55 1937   rupture right inguinal hernia   INGUINAL HERNIA REPAIR  03/02/2011   Procedure: HERNIA REPAIR INGUINAL ADULT;  Surgeon: Earnstine Regal, MD;  Location: [facility];  Service: General;  Laterality: Left;  with mesh    TRANSURETHRAL RESECTION OF BLADDER TUMOR WITH GYRUS (TURBT-GYRUS) N/A 02/25/2015   Procedure: TRANSURETHRAL RESECTION OF BLADDER TUMOR WITH GYRUS (TURBT-GYRUS);  Surgeon: Kathie Rhodes, MD;  Location: [facility];  Service: Urology;  Laterality: N/A;        Home Medications    Prior to Admission medications   Medication Sig Start Date End Date Taking? Authorizing Provider  ALOE VERA JUICE LIQD Take 30 mLs by mouth every morning.    [provider]  aspirin EC 81 MG tablet Take 81 mg by mouth at bedtime.     [provider]  Black Pepper-Turmeric (TURMERIC COMPLEX/BLACK PEPPER PO) Take  1 capsule by mouth daily.    [provider]  cetirizine (ZYRTEC) 10 MG tablet Take 10 mg by mouth daily as needed for allergies.    [provider]  naphazoline-pheniramine (NAPHCON-A) 0.025-0.3 % ophthalmic solution Place 1 drop into both eyes 2 (two) times daily.    [provider]  protein supplement shake (PREMIER PROTEIN) LIQD Take 2 oz by mouth daily. Add to PET butter pecan ice cream for calorie/protein supplement.    [provider]  triamcinolone ointment (KENALOG) 0.1 % Apply 1 application topically 2 (two) times daily.  12/25/15   [provider]  TRULANCE 3 MG TABS Take 3 mg by mouth every morning.  10/29/15   [provider]  Wheat Dextrin (BENEFIBER DRINK MIX PO) Take 5 mLs by mouth 2 (two) times daily.     [provider]    Family History Family History  Problem Relation Age of Onset   Heart disease Mother    Heart disease Father    Cancer Sister        breast   Cancer Brother        colon    Social History Social History   Tobacco Use   Smoking status: Former Smoker    Packs/day: 1.50    Years: 34.00    Pack years: 51.00    Types: Cigarettes    Last attempt to quit: 02/17/1979    Years since quitting: 39.5   Smokeless tobacco: Never Used  Substance Use Topics   Alcohol use: Yes    Comment: occasional   Drug use: No     Allergies   Amoxicillin; Ciprofloxacin; Sulfasalazine; Sulfa antibiotics; Doxycycline hyclate; and Levofloxacin   Review of Systems Review of Systems  All systems reviewed and negative, other than as noted in HPI.  Physical Exam Updated Vital Signs BP 90/62    Pulse 76    Temp 98.6 F (37 C) (Oral)    Resp 19    Ht 5\' 7"  (1.702 m)    Wt 64.4 kg    SpO2 100%    BMI 22.24 kg/m   Physical Exam Vitals signs and nursing note reviewed.  Constitutional:      General: He is not in acute distress.    Appearance: He is well-developed.  HENT:     Head: Normocephalic and atraumatic.  Eyes:     General:        Right eye: No discharge.        Left eye: No discharge.     Conjunctiva/sclera: Conjunctivae normal.  Neck:     Musculoskeletal: Neck supple.  Cardiovascular:     Rate and Rhythm: Normal rate and regular rhythm.     Heart sounds: Normal heart sounds. No murmur. No friction rub. No gallop.   Pulmonary:     Effort: Pulmonary effort is normal. No respiratory distress.     Breath sounds: Normal breath sounds.    Abdominal:     General: There is no distension.     Palpations: Abdomen is soft.     Tenderness: There is abdominal tenderness in the suprapubic area.  Musculoskeletal:        General: No tenderness.  Skin:    General: Skin is warm and dry.  Neurological:     Mental Status: He is alert.  Psychiatric:        Behavior: Behavior normal.        Thought Content: Thought content normal.  ED Treatments / Results  Labs (all labs ordered are listed, but only abnormal results are displayed) Labs Reviewed  CBC WITH DIFFERENTIAL/PLATELET - Abnormal; Notable for the following components:      Result Value   WBC 17.2 (*)    RBC 3.51 (*)    Hemoglobin 10.7 (*)    HCT 33.8 (*)    Neutro Abs 16.2 (*)    Lymphs Abs 0.2 (*)    All other components within normal limits  COMPREHENSIVE METABOLIC PANEL - Abnormal; Notable for the following components:   CO2 21 (*)    Glucose, Bld 115 (*)    BUN 57 (*)    Creatinine, Ser 2.29 (*)    Calcium 8.8 (*)    GFR calc non Af Amer 25 (*)    GFR calc Af Amer 29 (*)    All other components within normal limits  URINALYSIS, ROUTINE W REFLEX MICROSCOPIC - Abnormal; Notable for the following components:   APPearance CLOUDY (*)    Hgb urine dipstick SMALL (*)    Protein, ur 30 (*)    Leukocytes,Ua LARGE (*)    WBC, UA >50 (*)    Bacteria, UA RARE (*)    Non Squamous Epithelial 0-5 (*)    All other components within normal limits  SARS CORONAVIRUS 2 (HOSPITAL ORDER, [place])  CULTURE, BLOOD (ROUTINE X 2)  CULTURE, BLOOD (ROUTINE X 2)  URINE CULTURE  LACTIC ACID, PLASMA  BASIC METABOLIC PANEL  CBC WITH DIFFERENTIAL/PLATELET  SODIUM, URINE, RANDOM  CREATININE, URINE, RANDOM    EKG None  Radiology Ct Abdomen Pelvis Wo Contrast  Result Date: 08/13/2018 CLINICAL DATA:  85 year old male with abdominal pain and distention. History of bladder cancer. EXAM: CT ABDOMEN AND PELVIS WITHOUT CONTRAST TECHNIQUE:  Multidetector CT imaging of the abdomen and pelvis was performed following the standard protocol without IV contrast. COMPARISON:  CT of the abdomen pelvis dated 02/05/2015 FINDINGS: Evaluation of this exam is limited in the absence of intravenous contrast. Lower chest: The visualized lung bases are clear. No intra-abdominal free air or free fluid. Hepatobiliary: The liver is unremarkable. There is layering stone within the gallbladder. No pericholecystic fluid. Pancreas: Unremarkable. No pancreatic ductal dilatation or surrounding inflammatory changes. Spleen: Normal in size without focal abnormality. Adrenals/Urinary Tract: The adrenal glands are unremarkable. There is severe bilateral hydronephroureter, new since the prior CT, likely related to bladder outlet obstruction versus less likely obstruction at the level of the UVJ bilaterally. The urinary bladder is moderately distended. There is diffuse mild thickened appearance of the bladder wall with surrounding haziness. Correlation with urinalysis recommended to exclude active cystitis. Stomach/Bowel: There is redundancy of the sigmoid colon. There is moderate stool throughout the colon. There is no bowel obstruction or active inflammation. Normal appendix. Vascular/Lymphatic: There is advanced aortoiliac atherosclerotic disease. There is a 3.1 cm infrarenal abdominal aortic aneurysm, unchanged since the prior CT. No portal venous gas. There is no adenopathy. Reproductive: The prostate gland is enlarged measuring 5.5 cm in transverse axial diameter. The seminal vesicles are symmetric. Other: None Musculoskeletal: Multilevel degenerative changes of the spine. Grade 1 L3-L4 and L4-L5 anterolisthesis. No acute osseous pathology. IMPRESSION: 1. Development of severe bilateral hydronephrosis, new compared to the prior CT, likely related to bladder outlet obstruction versus less likely obstruction at the level of the UVJ bilaterally. Urology consult is advised. 2.  Moderate colonic stool burden. No bowel obstruction or active inflammation. Normal appendix. 3. Cholelithiasis. 4. Advanced aortoiliac  atherosclerotic disease. A 3.1 cm infrarenal abdominal aortic aneurysm. Recommend followup by ultrasound in 3 years. This recommendation follows ACR consensus guidelines: White Paper of the ACR Incidental Findings Committee II on Vascular Findings. J Am Coll Radiol 2013; 10:789-794. Electronically Signed   By: Anner Crete M.D.   On: 08/13/2018 21:12   Dg Chest Portable 1 View  Result Date: 08/13/2018 CLINICAL DATA:  Fevers EXAM: PORTABLE CHEST 1 VIEW COMPARISON:  08/03/2016 FINDINGS: Cardiac shadow is within normal limits. Mild aortic calcifications are seen. The lungs are clear bilaterally. No acute bony abnormality is noted. IMPRESSION: No active disease. Electronically Signed   By: Inez Catalina M.D.   On: 08/13/2018 19:54    Procedures Procedures (including critical care time)  Medications Ordered in ED Medications  0.9 %  sodium chloride infusion ( Intravenous New Bag/Given 08/13/18 2012)  fentaNYL (SUBLIMAZE) injection 50 mcg (has no administration in time range)  acetaminophen (TYLENOL) tablet 650 mg (650 mg Oral Given 08/13/18 2009)  sodium chloride 0.9 % bolus 1,000 mL (1,000 mLs Intravenous New Bag/Given (Non-Interop) 08/13/18 2038)  cefTRIAXone (ROCEPHIN) 1 g in sodium chloride 0.9 % 100 mL IVPB (0 g Intravenous Stopped 08/13/18 2216)     Initial Impression / Assessment and Plan / ED Course  I have reviewed the triage vital signs and the nursing notes.  Pertinent labs & imaging results that were available during my care of the patient were reviewed by me and considered in my medical decision making (see chart for details).  88yM with fever. Likely from UTI. UA consistent. Rocephin ordered. AKI. Likely post obstructive uropathy. Bilateral hydronephrosis on CT and bladder distended. Pt only voiding small amounts in ED. Difficulty placing foley and had to  use coude. ~700cc return.   Final Clinical Impressions(s) / ED Diagnoses   Final diagnoses:  Acute cystitis without hematuria  AKI (acute kidney injury) [facility])  Urinary retention    ED Discharge Orders    None       Virgel Manifold, MD 08/13/18 2249

## 2018-08-13 NOTE — ED Notes (Signed)
ED TO INPATIENT HANDOFF REPORT  ED Nurse Name and Phone #:  Nila Nephew [phone]  S Name/Age/Gender Benjamin Cardenas 85 y.o. male Room/Bed: WA03/WA03  Code Status   Code Status: Full Code  Home/SNF/Other Home Patient oriented to: self, place, time and situation Is this baseline? Yes   Triage Complete: Triage complete  Chief Complaint Chills and Groin Pain   Triage Note Pt rambling in conversation, lower abd and back pain as well as "rt crotch" sounds like some of this is chronic. Pt does have fever in triage. Wife states he has history of bladder cancer dec 2016. She also states pt has been lethargic today and confused   Allergies Allergies  Allergen Reactions  . Amoxicillin Rash    Has patient had a PCN reaction causing immediate rash, facial/tongue/throat swelling, SOB or lightheadedness with hypotension:No Has patient had a PCN reaction causing severe rash involving mucus membranes or skin necrosis:unsure Has patient had a PCN reaction that required hospitalization:No Has patient had a PCN reaction occurring within the last 10 years:unsure If all of the above answers are "NO", then may proceed with Cephalosporin use.   . Ciprofloxacin Swelling and Rash  . Sulfasalazine Swelling  . Sulfa Antibiotics Swelling  . Doxycycline Hyclate Rash  . Levofloxacin Rash    Level of Care/Admitting Diagnosis ED Disposition    ED Disposition Condition [facility] Area: [place] [[id number]]  Level of Care: Stepdown [14]  Admit to SDU based on following criteria: Hemodynamic compromise or significant risk of instability:  Patient requiring short term acute titration and management of vasoactive drips, and invasive monitoring (i.e., CVP and Arterial line).  Covid Evaluation: N/A  Diagnosis: Sepsis secondary to UTI [facility]) [[id number]]  Admitting Physician: Vianne Bulls [[id number]]  Attending Physician: Vianne Bulls [[id number]]  PT Class (Do Not Modify):  Observation [104]  PT Acc Code (Do Not Modify): Observation [10022]       B Medical/Surgery History Past Medical History:  Diagnosis Date  . Bladder cancer [facility]) urology-- dr Karsten Ro   dx 11/ 2016  Transitional cell carcinoma, High grade invasive , Stage T1  G3 ---  s/p  TURBT and Mitomycin C instillation  . BPH with urinary obstruction   . Dry skin dermatitis   . Elevated PSA   . History of colon polyps   . Hydronephrosis, left   . Lower urinary tract symptoms (LUTS)   . OA (osteoarthritis)   . Prostate nodule   . Seasonal allergic rhinitis   . Wears glasses    Past Surgical History:  Procedure Laterality Date  . cataract surgery    . CYSTOSCOPY WITH BIOPSY N/A 04/22/2015   Procedure: CYSTOSCOPY WITH BLADDER BIOPSY;  Surgeon: Kathie Rhodes, MD;  Location: [facility];  Service: Urology;  Laterality: N/A;  . CYSTOSCOPY WITH RETROGRADE PYELOGRAM, URETEROSCOPY AND STENT PLACEMENT Left 02/25/2015   Procedure: LEFT RETROGRADE PYELOGRAM, URETEROSCOPY ;  Surgeon: Kathie Rhodes, MD;  Location: [facility];  Service: Urology;  Laterality: Left;  . HERNIA REPAIR  age 61 [id number]   rupture right inguinal hernia  . INGUINAL HERNIA REPAIR  03/02/2011   Procedure: HERNIA REPAIR INGUINAL ADULT;  Surgeon: Earnstine Regal, MD;  Location: [facility];  Service: General;  Laterality: Left;  with mesh   . TRANSURETHRAL RESECTION OF BLADDER TUMOR WITH GYRUS (TURBT-GYRUS) N/A 02/25/2015   Procedure: TRANSURETHRAL RESECTION OF BLADDER TUMOR WITH GYRUS (TURBT-GYRUS);  Surgeon: Kathie Rhodes, MD;  Location: [place];  Service: Urology;  Laterality: N/A;     A IV Location/Drains/Wounds Patient Lines/Drains/Airways Status   Active Line/Drains/Airways    Name:   Placement date:   Placement time:   Site:   Days:   Peripheral IV 08/13/18 Right Forearm   08/13/18    1925    Forearm   less than 1   Peripheral IV 08/13/18 Left Antecubital   08/13/18    2014    Antecubital    less than 1   Urethral Catheter Ilene H. RN Coude 16 Fr.   08/13/18    2210    Coude   less than 1          Intake/Output Last 24 hours  Intake/Output Summary (Last 24 hours) at 08/13/2018 2314 Last data filed at 08/13/2018 2216 Gross per 24 hour  Intake 97.6 ml  Output 550 ml  Net -452.4 ml    Labs/Imaging Results for orders placed or performed during the hospital encounter of 08/13/18 (from the past 48 hour(s))  CBC with Differential     Status: Abnormal   Collection Time: 08/13/18  7:37 PM  Result Value Ref Range   WBC 17.2 (H) 4.0 - 10.5 K/uL   RBC 3.51 (L) 4.22 - 5.81 MIL/uL   Hemoglobin 10.7 (L) 13.0 - 17.0 g/dL   HCT 33.8 (L) 39.0 - 52.0 %   MCV 96.3 80.0 - 100.0 fL   MCH 30.5 26.0 - 34.0 pg   MCHC 31.7 30.0 - 36.0 g/dL   RDW 13.2 11.5 - 15.5 %   Platelets 238 150 - 400 K/uL   nRBC 0.0 0.0 - 0.2 %   Neutrophils Relative % 95 %   Neutro Abs 16.2 (H) 1.7 - 7.7 K/uL   Lymphocytes Relative 1 %   Lymphs Abs 0.2 (L) 0.7 - 4.0 K/uL   Monocytes Relative 3 %   Monocytes Absolute 0.6 0.1 - 1.0 K/uL   Eosinophils Relative 1 %   Eosinophils Absolute 0.1 0.0 - 0.5 K/uL   Basophils Relative 0 %   Basophils Absolute 0.0 0.0 - 0.1 K/uL   Immature Granulocytes 0 %   Abs Immature Granulocytes 0.06 0.00 - 0.07 K/uL    Comment: Performed at [facility], [place] [address], [place], [place] [address]  Comprehensive metabolic panel     Status: Abnormal   Collection Time: 08/13/18  7:37 PM  Result Value Ref Range   Sodium 137 135 - 145 mmol/L   Potassium 4.8 3.5 - 5.1 mmol/L   Chloride 106 98 - 111 mmol/L   CO2 21 (L) 22 - 32 mmol/L   Glucose, Bld 115 (H) 70 - 99 mg/dL   BUN 57 (H) 8 - 23 mg/dL   Creatinine, Ser 2.29 (H) 0.61 - 1.24 mg/dL   Calcium 8.8 (L) 8.9 - 10.3 mg/dL   Total Protein 6.9 6.5 - 8.1 g/dL   Albumin 3.6 3.5 - 5.0 g/dL   AST 23 15 - 41 U/L   ALT 16 0 - 44 U/L   Alkaline Phosphatase 88 38 - 126 U/L   Total Bilirubin 0.7 0.3 - 1.2 mg/dL   GFR  calc non Af Amer 25 (L) >60 mL/min   GFR calc Af Amer 29 (L) >60 mL/min   Anion gap 10 5 - 15    Comment: Performed at [facility], [place] [address], [place], [place] [address]  Urinalysis, Routine w reflex microscopic     Status:  Abnormal   Collection Time: 08/13/18  7:37 PM  Result Value Ref Range   Color, Urine YELLOW YELLOW   APPearance CLOUDY (A) CLEAR   Specific Gravity, Urine 1.011 1.005 - 1.030   pH 7.0 5.0 - 8.0   Glucose, UA NEGATIVE NEGATIVE mg/dL   Hgb urine dipstick SMALL (A) NEGATIVE   Bilirubin Urine NEGATIVE NEGATIVE   Ketones, ur NEGATIVE NEGATIVE mg/dL   Protein, ur 30 (A) NEGATIVE mg/dL   Nitrite NEGATIVE NEGATIVE   Leukocytes,Ua LARGE (A) NEGATIVE   RBC / HPF 21-50 0 - 5 RBC/hpf   WBC, UA >50 (H) 0 - 5 WBC/hpf   Bacteria, UA RARE (A) NONE SEEN   Squamous Epithelial / LPF 0-5 0 - 5   WBC Clumps PRESENT    Mucus PRESENT    Amorphous Crystal PRESENT    Non Squamous Epithelial 0-5 (A) NONE SEEN    Comment: Performed at [facility], [place] [address] [place], [place] [address]  SARS Coronavirus 2 (CEPHEID- Performed in [facility] hospital lab), Hosp Order     Status: None   Collection Time: 08/13/18  7:38 PM  Result Value Ref Range   SARS Coronavirus 2 NEGATIVE NEGATIVE    Comment: (NOTE) If result is NEGATIVE SARS-CoV-2 target nucleic acids are NOT DETECTED. The SARS-CoV-2 RNA is generally detectable in upper and lower  respiratory specimens during the acute phase of infection. The lowest  concentration of SARS-CoV-2 viral copies this assay can detect is 250  copies / mL. A negative result does not preclude SARS-CoV-2 infection  and should not be used as the sole basis for treatment or other  patient management decisions.  A negative result may occur with  improper specimen collection / handling, submission of specimen other  than nasopharyngeal swab, presence of viral mutation(s) within the  areas targeted by this assay,  and inadequate number of viral copies  (<250 copies / mL). A negative result must be combined with clinical  observations, patient history, and epidemiological information. If result is POSITIVE SARS-CoV-2 target nucleic acids are DETECTED. The SARS-CoV-2 RNA is generally detectable in upper and lower  respiratory specimens dur ing the acute phase of infection.  Positive  results are indicative of active infection with SARS-CoV-2.  Clinical  correlation with patient history and other diagnostic information is  necessary to determine patient infection status.  Positive results do  not rule out bacterial infection or co-infection with other viruses. If result is PRESUMPTIVE POSTIVE SARS-CoV-2 nucleic acids MAY BE PRESENT.   A presumptive positive result was obtained on the submitted specimen  and confirmed on repeat testing.  While 2019 novel coronavirus  (SARS-CoV-2) nucleic acids may be present in the submitted sample  additional confirmatory testing may be necessary for epidemiological  and / or clinical management purposes  to differentiate between  SARS-CoV-2 and other Sarbecovirus currently known to infect humans.  If clinically indicated additional testing with an alternate test  methodology [phone]) is advised. The SARS-CoV-2 RNA is generally  detectable in upper and lower respiratory sp ecimens during the acute  phase of infection. The expected result is Negative. Fact Sheet for Patients:  StrictlyIdeas.no Fact Sheet for Healthcare Providers: BankingDealers.co.za This test is not yet approved or cleared by the [place] FDA and has been authorized for detection and/or diagnosis of SARS-CoV-2 by FDA under an Emergency Use Authorization (EUA).  This EUA will remain in effect (meaning this test can be used) for the duration of the  COVID-19 declaration under Section 564(b)(1) of the Act, 21 U.S.C. section 360bbb-3(b)(1), unless  the authorization is terminated or revoked sooner. Performed at [facility], [place] [address], [place], [place] [address]   Lactic acid, plasma     Status: None   Collection Time: 08/13/18  8:13 PM  Result Value Ref Range   Lactic Acid, Venous 0.8 0.5 - 1.9 mmol/L    Comment: Performed at [facility], [place] [address], [place], [place] [address]   Ct Abdomen Pelvis Wo Contrast  Result Date: 08/13/2018 CLINICAL DATA:  85 year old male with abdominal pain and distention. History of bladder cancer. EXAM: CT ABDOMEN AND PELVIS WITHOUT CONTRAST TECHNIQUE: Multidetector CT imaging of the abdomen and pelvis was performed following the standard protocol without IV contrast. COMPARISON:  CT of the abdomen pelvis dated 02/05/2015 FINDINGS: Evaluation of this exam is limited in the absence of intravenous contrast. Lower chest: The visualized lung bases are clear. No intra-abdominal free air or free fluid. Hepatobiliary: The liver is unremarkable. There is layering stone within the gallbladder. No pericholecystic fluid. Pancreas: Unremarkable. No pancreatic ductal dilatation or surrounding inflammatory changes. Spleen: Normal in size without focal abnormality. Adrenals/Urinary Tract: The adrenal glands are unremarkable. There is severe bilateral hydronephroureter, new since the prior CT, likely related to bladder outlet obstruction versus less likely obstruction at the level of the UVJ bilaterally. The urinary bladder is moderately distended. There is diffuse mild thickened appearance of the bladder wall with surrounding haziness. Correlation with urinalysis recommended to exclude active cystitis. Stomach/Bowel: There is redundancy of the sigmoid colon. There is moderate stool throughout the colon. There is no bowel obstruction or active inflammation. Normal appendix. Vascular/Lymphatic: There is advanced aortoiliac atherosclerotic disease. There is a 3.1 cm infrarenal abdominal  aortic aneurysm, unchanged since the prior CT. No portal venous gas. There is no adenopathy. Reproductive: The prostate gland is enlarged measuring 5.5 cm in transverse axial diameter. The seminal vesicles are symmetric. Other: None Musculoskeletal: Multilevel degenerative changes of the spine. Grade 1 L3-L4 and L4-L5 anterolisthesis. No acute osseous pathology. IMPRESSION: 1. Development of severe bilateral hydronephrosis, new compared to the prior CT, likely related to bladder outlet obstruction versus less likely obstruction at the level of the UVJ bilaterally. Urology consult is advised. 2. Moderate colonic stool burden. No bowel obstruction or active inflammation. Normal appendix. 3. Cholelithiasis. 4. Advanced aortoiliac atherosclerotic disease. A 3.1 cm infrarenal abdominal aortic aneurysm. Recommend followup by ultrasound in 3 years. This recommendation follows ACR consensus guidelines: White Paper of the ACR Incidental Findings Committee II on Vascular Findings. J Am Coll Radiol 2013; 10:789-794. Electronically Signed   By: Anner Crete M.D.   On: 08/13/2018 21:12   Dg Chest Portable 1 View  Result Date: 08/13/2018 CLINICAL DATA:  Fevers EXAM: PORTABLE CHEST 1 VIEW COMPARISON:  08/03/2016 FINDINGS: Cardiac shadow is within normal limits. Mild aortic calcifications are seen. The lungs are clear bilaterally. No acute bony abnormality is noted. IMPRESSION: No active disease. Electronically Signed   By: Inez Catalina M.D.   On: 08/13/2018 19:54    Pending Labs Unresulted Labs (From admission, onward)    Start     Ordered   08/14/18 9470  Basic metabolic panel  Tomorrow morning,   R     08/13/18 2245   08/14/18 0500  CBC WITH DIFFERENTIAL  Tomorrow morning,   R     08/13/18 2245   08/13/18 2246  Sodium, urine, random  Add-on,   R  08/13/18 2245   08/13/18 2246  Creatinine, urine, random  Add-on,   R     08/13/18 2245   08/13/18 2238  Culture, Urine  Add-on,   R     08/13/18 2237    08/13/18 1937  Blood culture (routine x 2)  BLOOD CULTURE X 2,   STAT     08/13/18 1936          Vitals/Pain Today's Vitals   08/13/18 2222 08/13/18 2242 08/13/18 2300 08/13/18 2313  BP:  (!) 84/57 (!) 91/56   Pulse:  75 75   Resp:  12 16   Temp:      TempSrc:      SpO2:  100% 99%   Weight:      Height:      PainSc: 8    4     Isolation Precautions No active isolations  Medications Medications  acetaminophen (TYLENOL) tablet 650 mg (has no administration in time range)    Or  acetaminophen (TYLENOL) suppository 650 mg (has no administration in time range)  ondansetron (ZOFRAN) tablet 4 mg (has no administration in time range)    Or  ondansetron (ZOFRAN) injection 4 mg (has no administration in time range)  polyethylene glycol (MIRALAX / GLYCOLAX) packet 17 g (has no administration in time range)  bisacodyl (DULCOLAX) suppository 10 mg (has no administration in time range)  cefTRIAXone (ROCEPHIN) 1 g in sodium chloride 0.9 % 100 mL IVPB (has no administration in time range)  sodium chloride 0.9 % bolus 1,000 mL (1,000 mLs Intravenous New Bag/Given (Non-Interop) 08/13/18 2255)  acetaminophen (TYLENOL) tablet 650 mg (650 mg Oral Given 08/13/18 2009)  sodium chloride 0.9 % bolus 1,000 mL (0 mLs Intravenous Stopped 08/13/18 2255)  cefTRIAXone (ROCEPHIN) 1 g in sodium chloride 0.9 % 100 mL IVPB (0 g Intravenous Stopped 08/13/18 2216)  fentaNYL (SUBLIMAZE) injection 50 mcg (50 mcg Intravenous Given 08/13/18 2247)    Mobility walks High fall risk   Focused Assessments Cardiac Assessment Handoff:    No results found for: CKTOTAL, CKMB, CKMBINDEX, TROPONINI No results found for: DDIMER Does the Patient currently have chest pain? No      R Recommendations: See Admitting Provider Note  Report given to:   Additional Notes:

## 2018-08-13 NOTE — H&P (Signed)
History and Physical    Benjamin Cardenas QAS:[id number] DOB: 1929/10/17 DOA: 08/13/2018  PCP: Isaac Bliss, Rayford Halsted, MD   Patient coming from: Home   Chief Complaint: Lethargy, confusion   HPI: Benjamin Cardenas is a 85 y.o. male with medical history significant for bladder cancer, BPH, and chronic constipation, now presenting to the emergency department with 1 day of lethargy and confusion.  Patient's wife noted him to be lethargic today with some confusion.  Patient himself complains of lower abdominal pain, chronic but now worse.  He has history of BPH with severe LUTS and has been attempting to urinate every hour at home with small volume voiding per report of his wife.  He follows with urology every 6 months for his bladder cancer and the last seen in November when he had cystoscopy, and is scheduled for next follow-up in 10 days from now.  He has not had a cough or shortness of breath, denies chest pain, and denies headache, change in vision or hearing, or focal numbness or weakness.  ED Course: Upon arrival to the ED, patient is found to be febrile to 38.2 C, saturating well on room air, tachycardic in the low 100s, and with SBP in the mid 80s.  CBC is notable for leukocytosis to 17,200 and a normocytic anemia with hemoglobin 10.7.  Chemistry panel is concerning for creatinine of 2.29, up from 1.4 last October.  COVID-19 testing is negative.  Chest x-ray is negative for acute cardiopulmonary disease.  Urinalysis is compatible with possible infection.  Lactic acid is reassuringly normal.  CT the abdomen and pelvis demonstrates severe bilateral hydronephrosis that is new from 2016 and likely due to bladder outlet obstruction, or less likely bilateral UPJ obstructions.  Blood cultures were collected in the ED and the patient was given a liter of normal saline, acetaminophen, and IV Rocephin.  Foley catheter was attempted but there was significant resistance at the level of the prostate and a coud  catheter was then successfully inserted with immediate drainage of roughly 700 cc.  Tachycardia resolved with the IV fluids, but blood pressure remains marginal and patient will be observed in the stepdown unit.   Review of Systems:  All other systems reviewed and apart from HPI, are negative.  Past Medical History:  Diagnosis Date   Bladder cancer [facility]) urology-- dr Karsten Ro   dx 11/ 2016  Transitional cell carcinoma, High grade invasive , Stage T1  G3 ---  s/p  TURBT and Mitomycin C instillation   BPH with urinary obstruction    Dry skin dermatitis    Elevated PSA    History of colon polyps    Hydronephrosis, left    Lower urinary tract symptoms (LUTS)    OA (osteoarthritis)    Prostate nodule    Seasonal allergic rhinitis    Wears glasses     Past Surgical History:  Procedure Laterality Date   cataract surgery     CYSTOSCOPY WITH BIOPSY N/A 04/22/2015   Procedure: CYSTOSCOPY WITH BLADDER BIOPSY;  Surgeon: Kathie Rhodes, MD;  Location: [place];  Service: Urology;  Laterality: N/A;   CYSTOSCOPY WITH RETROGRADE PYELOGRAM, URETEROSCOPY AND STENT PLACEMENT Left 02/25/2015   Procedure: LEFT RETROGRADE PYELOGRAM, URETEROSCOPY ;  Surgeon: Kathie Rhodes, MD;  Location: [facility];  Service: Urology;  Laterality: Left;   HERNIA REPAIR  age 32 1937   rupture right inguinal hernia   INGUINAL HERNIA REPAIR  03/02/2011   Procedure: HERNIA REPAIR INGUINAL ADULT;  Surgeon: Earnstine Regal, MD;  Location: [facility];  Service: General;  Laterality: Left;  with mesh    TRANSURETHRAL RESECTION OF BLADDER TUMOR WITH GYRUS (TURBT-GYRUS) N/A 02/25/2015   Procedure: TRANSURETHRAL RESECTION OF BLADDER TUMOR WITH GYRUS (TURBT-GYRUS);  Surgeon: Kathie Rhodes, MD;  Location: [facility];  Service: Urology;  Laterality: N/A;     reports that he quit smoking about 39 years ago. His smoking use included cigarettes. He has a 51.00 pack-year smoking  history. He has never used smokeless tobacco. He reports current alcohol use. He reports that he does not use drugs.  Allergies  Allergen Reactions   Amoxicillin Rash    Has patient had a PCN reaction causing immediate rash, facial/tongue/throat swelling, SOB or lightheadedness with hypotension:No Has patient had a PCN reaction causing severe rash involving mucus membranes or skin necrosis:unsure Has patient had a PCN reaction that required hospitalization:No Has patient had a PCN reaction occurring within the last 10 years:unsure If all of the above answers are "NO", then may proceed with Cephalosporin use.    Ciprofloxacin Swelling and Rash   Sulfasalazine Swelling   Sulfa Antibiotics Swelling   Doxycycline Hyclate Rash   Levofloxacin Rash    Family History  Problem Relation Age of Onset   Heart disease Mother    Heart disease Father    Cancer Sister        breast   Cancer Brother        colon     Prior to Admission medications   Medication Sig Start Date End Date Taking? Authorizing Provider  ALOE VERA JUICE LIQD Take 30 mLs by mouth every morning.    [provider]  aspirin EC 81 MG tablet Take 81 mg by mouth at bedtime.     [provider]  Black Pepper-Turmeric (TURMERIC COMPLEX/BLACK PEPPER PO) Take 1 capsule by mouth daily.    [provider]  cetirizine (ZYRTEC) 10 MG tablet Take 10 mg by mouth daily as needed for allergies.    [provider]  naphazoline-pheniramine (NAPHCON-A) 0.025-0.3 % ophthalmic solution Place 1 drop into both eyes 2 (two) times daily.    [provider]  protein supplement shake (PREMIER PROTEIN) LIQD Take 2 oz by mouth daily. Add to PET butter pecan ice cream for calorie/protein supplement.    [provider]  triamcinolone ointment (KENALOG) 0.1 % Apply 1 application topically 2 (two) times daily.  12/25/15   [provider]  TRULANCE 3 MG TABS Take 3 mg by mouth every  morning.  10/29/15   [provider]  Wheat Dextrin (BENEFIBER DRINK MIX PO) Take 5 mLs by mouth 2 (two) times daily.     [provider]    Physical Exam: Vitals:   08/13/18 2030 08/13/18 2140 08/13/18 2200 08/13/18 2242  BP: 100/64  90/62 (!) 84/57  Pulse: 81  76 75  Resp: 16  19 12   Temp:  98.6 F (37 C)    TempSrc:  Oral    SpO2: 100%  100% 100%  Weight:      Height:        Constitutional: NAD, calm  Eyes: PERTLA, lids and conjunctivae normal ENMT: Mucous membranes are moist. Posterior pharynx clear of any exudate or lesions.   Neck: normal, supple, no masses, no thyromegaly Respiratory: clear to auscultation bilaterally, no wheezing, no crackles. No accessory muscle use.  Cardiovascular: S1 & S2 heard, regular rate and rhythm. No extremity edema.   Abdomen:  No distension, soft, suprapubic tenderness. No rebound pain or guarding. Bowel sounds active.  Musculoskeletal: no clubbing / cyanosis. No joint deformity upper and lower extremities.   Skin: no significant rashes, lesions, ulcers. Warm, dry, well-perfused. Neurologic: CN 2-12 grossly intact. Sensation intact. Moving all extremities.  Psychiatric: Alert and oriented to person, place, and situation. Very pleasant and cooperative.     Labs on Admission: I have personally reviewed following labs and imaging studies  CBC: Recent Labs  Lab 08/13/18 1937  WBC 17.2*  NEUTROABS 16.2*  HGB 10.7*  HCT 33.8*  MCV 96.3  PLT 449   Basic Metabolic Panel: Recent Labs  Lab 08/13/18 1937  NA 137  K 4.8  CL 106  CO2 21*  GLUCOSE 115*  BUN 57*  CREATININE 2.29*  CALCIUM 8.8*   GFR: Estimated Creatinine Clearance: 21.1 mL/min (A) (by C-G formula based on SCr of 2.29 mg/dL (H)). Liver Function Tests: Recent Labs  Lab 08/13/18 1937  AST 23  ALT 16  ALKPHOS 88  BILITOT 0.7  PROT 6.9  ALBUMIN 3.6   No results for input(s): LIPASE, AMYLASE in the last 168 hours. No results for input(s): AMMONIA  in the last 168 hours. Coagulation Profile: No results for input(s): INR, PROTIME in the last 168 hours. Cardiac Enzymes: No results for input(s): CKTOTAL, CKMB, CKMBINDEX, TROPONINI in the last 168 hours. BNP (last 3 results) No results for input(s): PROBNP in the last 8760 hours. HbA1C: No results for input(s): HGBA1C in the last 72 hours. CBG: No results for input(s): GLUCAP in the last 168 hours. Lipid Profile: No results for input(s): CHOL, HDL, LDLCALC, TRIG, CHOLHDL, LDLDIRECT in the last 72 hours. Thyroid Function Tests: No results for input(s): TSH, T4TOTAL, FREET4, T3FREE, THYROIDAB in the last 72 hours. Anemia Panel: No results for input(s): VITAMINB12, FOLATE, FERRITIN, TIBC, IRON, RETICCTPCT in the last 72 hours. Urine analysis:    Component Value Date/Time   COLORURINE YELLOW 08/13/2018 1937   APPEARANCEUR CLOUDY (A) 08/13/2018 1937   LABSPEC 1.011 08/13/2018 1937   PHURINE 7.0 08/13/2018 1937   GLUCOSEU NEGATIVE 08/13/2018 1937   HGBUR SMALL (A) 08/13/2018 1937   BILIRUBINUR NEGATIVE 08/13/2018 1937   BILIRUBINUR n 06/26/2016 0810   KETONESUR NEGATIVE 08/13/2018 1937   PROTEINUR 30 (A) 08/13/2018 1937   UROBILINOGEN 0.2 06/26/2016 0810   UROBILINOGEN 0.2 07/11/2012 0200   NITRITE NEGATIVE 08/13/2018 1937   LEUKOCYTESUR LARGE (A) 08/13/2018 1937   Sepsis Labs: @LABRCNTIP (procalcitonin:4,lacticidven:4) ) Recent Results (from the past 240 hour(s))  SARS Coronavirus 2 (CEPHEID- Performed in [place] hospital lab), Hosp Order     Status: None   Collection Time: 08/13/18  7:38 PM  Result Value Ref Range Status   SARS Coronavirus 2 NEGATIVE NEGATIVE Final    Comment: (NOTE) If result is NEGATIVE SARS-CoV-2 target nucleic acids are NOT DETECTED. The SARS-CoV-2 RNA is generally detectable in upper and lower  respiratory specimens during the acute phase of infection. The lowest  concentration of SARS-CoV-2 viral copies this assay can detect is 250  copies /  mL. A negative result does not preclude SARS-CoV-2 infection  and should not be used as the sole basis for treatment or other  patient management decisions.  A negative result may occur with  improper specimen collection / handling, submission of specimen other  than nasopharyngeal swab, presence of viral mutation(s) within the  areas targeted by this assay, and inadequate number of viral copies  (<250 copies / mL). A negative result  must be combined with clinical  observations, patient history, and epidemiological information. If result is POSITIVE SARS-CoV-2 target nucleic acids are DETECTED. The SARS-CoV-2 RNA is generally detectable in upper and lower  respiratory specimens dur ing the acute phase of infection.  Positive  results are indicative of active infection with SARS-CoV-2.  Clinical  correlation with patient history and other diagnostic information is  necessary to determine patient infection status.  Positive results do  not rule out bacterial infection or co-infection with other viruses. If result is PRESUMPTIVE POSTIVE SARS-CoV-2 nucleic acids MAY BE PRESENT.   A presumptive positive result was obtained on the submitted specimen  and confirmed on repeat testing.  While 2019 novel coronavirus  (SARS-CoV-2) nucleic acids may be present in the submitted sample  additional confirmatory testing may be necessary for epidemiological  and / or clinical management purposes  to differentiate between  SARS-CoV-2 and other Sarbecovirus currently known to infect humans.  If clinically indicated additional testing with an alternate test  methodology [phone]) is advised. The SARS-CoV-2 RNA is generally  detectable in upper and lower respiratory sp ecimens during the acute  phase of infection. The expected result is Negative. Fact Sheet for Patients:  StrictlyIdeas.no Fact Sheet for Healthcare Providers: BankingDealers.co.za This test is  not yet approved or cleared by the [place] FDA and has been authorized for detection and/or diagnosis of SARS-CoV-2 by FDA under an Emergency Use Authorization (EUA).  This EUA will remain in effect (meaning this test can be used) for the duration of the COVID-19 declaration under Section 564(b)(1) of the Act, 21 U.S.C. section 360bbb-3(b)(1), unless the authorization is terminated or revoked sooner. Performed at [facility], [place] [address], [place], [place] [address]      Radiological Exams on Admission: Ct Abdomen Pelvis Wo Contrast  Result Date: 08/13/2018 CLINICAL DATA:  85 year old male with abdominal pain and distention. History of bladder cancer. EXAM: CT ABDOMEN AND PELVIS WITHOUT CONTRAST TECHNIQUE: Multidetector CT imaging of the abdomen and pelvis was performed following the standard protocol without IV contrast. COMPARISON:  CT of the abdomen pelvis dated 02/05/2015 FINDINGS: Evaluation of this exam is limited in the absence of intravenous contrast. Lower chest: The visualized lung bases are clear. No intra-abdominal free air or free fluid. Hepatobiliary: The liver is unremarkable. There is layering stone within the gallbladder. No pericholecystic fluid. Pancreas: Unremarkable. No pancreatic ductal dilatation or surrounding inflammatory changes. Spleen: Normal in size without focal abnormality. Adrenals/Urinary Tract: The adrenal glands are unremarkable. There is severe bilateral hydronephroureter, new since the prior CT, likely related to bladder outlet obstruction versus less likely obstruction at the level of the UVJ bilaterally. The urinary bladder is moderately distended. There is diffuse mild thickened appearance of the bladder wall with surrounding haziness. Correlation with urinalysis recommended to exclude active cystitis. Stomach/Bowel: There is redundancy of the sigmoid colon. There is moderate stool throughout the colon. There is no bowel obstruction or  active inflammation. Normal appendix. Vascular/Lymphatic: There is advanced aortoiliac atherosclerotic disease. There is a 3.1 cm infrarenal abdominal aortic aneurysm, unchanged since the prior CT. No portal venous gas. There is no adenopathy. Reproductive: The prostate gland is enlarged measuring 5.5 cm in transverse axial diameter. The seminal vesicles are symmetric. Other: None Musculoskeletal: Multilevel degenerative changes of the spine. Grade 1 L3-L4 and L4-L5 anterolisthesis. No acute osseous pathology. IMPRESSION: 1. Development of severe bilateral hydronephrosis, new compared to the prior CT, likely related to bladder outlet obstruction versus less likely obstruction  at the level of the UVJ bilaterally. Urology consult is advised. 2. Moderate colonic stool burden. No bowel obstruction or active inflammation. Normal appendix. 3. Cholelithiasis. 4. Advanced aortoiliac atherosclerotic disease. A 3.1 cm infrarenal abdominal aortic aneurysm. Recommend followup by ultrasound in 3 years. This recommendation follows ACR consensus guidelines: White Paper of the ACR Incidental Findings Committee II on Vascular Findings. J Am Coll Radiol 2013; 10:789-794. Electronically Signed   By: Anner Crete M.D.   On: 08/13/2018 21:12   Dg Chest Portable 1 View  Result Date: 08/13/2018 CLINICAL DATA:  Fevers EXAM: PORTABLE CHEST 1 VIEW COMPARISON:  08/03/2016 FINDINGS: Cardiac shadow is within normal limits. Mild aortic calcifications are seen. The lungs are clear bilaterally. No acute bony abnormality is noted. IMPRESSION: No active disease. Electronically Signed   By: Inez Catalina M.D.   On: 08/13/2018 19:54    EKG: Not performed.   Assessment/Plan   1. Sepsis secondary to UTI  - Presents with lower abdominal pain and lethargy with mild confusion x1 day, is found to be febrile and tachycardic with leukocytosis to 17k and SBP in 80's   - Urine is compatible with infection, CXR clear and no respiratory sxs and  COVID-19 screening test is negative   - No instrumentation since cystoscopy in November 2019  - Blood cultures collected in ED, 1 liter NS bolused, and Rocephin started  - Add-on urine culture, complete 30 cc/kg bolus, continue Rocephin, follow cultures and clinical course    2. AKI; bladder outlet obstruction; hx of bladder cancer   - SCr is 2.29 on admission, previously normal  - There is severe bilateral hydronephrosis on CT suspected secondary to BPH with bladder outlet obstruction  - He has bladder cancer without muscle invasion per pt report and is s/p TURBT and BCG, followed by Dr. Karsten Ro every 6 months; b/l UPJ obstructions conceivable in this setting but 700 cc was drained as soon as catheter was placed with relief of patient's lower abd pain   - Continue catheter drainage, check urine chemistries, follow I/O's, renally-dose medications, avoid nephrotoxins, repeat chem panel in am    3. Acute encephalopathy  - Pt's wife reports 1 day of lethargy and mild confusion  - Improved in ED with IVF and APAP, and he is fully oriented by time of admission  - No focal neuro deficits and no meningismus  - Likely secondary to acute infection and renal insufficiency and continued improvement anticipated with treatment of these    4. AAA  - Noted incidentally on CT  - Follow-up with [place] is recommended     PPE: Mask, face shield  DVT prophylaxis: SCD's  Code Status: Full  Family Communication: Wife updated at bedside   Consults called: None  Admission status: Observation     Vianne Bulls, MD [facility] Pager [phone]  If 7PM-7AM, please contact night-coverage [email or web address] Password [email or web address]  08/13/2018, 10:46 PM

## 2018-08-14 DIAGNOSIS — Z881 Allergy status to other antibiotic agents status: Secondary | ICD-10-CM | POA: Diagnosis not present

## 2018-08-14 DIAGNOSIS — Z79899 Other long term (current) drug therapy: Secondary | ICD-10-CM | POA: Diagnosis not present

## 2018-08-14 DIAGNOSIS — N401 Enlarged prostate with lower urinary tract symptoms: Secondary | ICD-10-CM | POA: Diagnosis not present

## 2018-08-14 DIAGNOSIS — N320 Bladder-neck obstruction: Secondary | ICD-10-CM | POA: Diagnosis not present

## 2018-08-14 DIAGNOSIS — I714 Abdominal aortic aneurysm, without rupture: Secondary | ICD-10-CM | POA: Diagnosis not present

## 2018-08-14 DIAGNOSIS — K5909 Other constipation: Secondary | ICD-10-CM | POA: Diagnosis present

## 2018-08-14 DIAGNOSIS — N136 Pyonephrosis: Secondary | ICD-10-CM | POA: Diagnosis present

## 2018-08-14 DIAGNOSIS — Z1159 Encounter for screening for other viral diseases: Secondary | ICD-10-CM | POA: Diagnosis not present

## 2018-08-14 DIAGNOSIS — N390 Urinary tract infection, site not specified: Secondary | ICD-10-CM | POA: Diagnosis not present

## 2018-08-14 DIAGNOSIS — G8929 Other chronic pain: Secondary | ICD-10-CM | POA: Diagnosis present

## 2018-08-14 DIAGNOSIS — Z8249 Family history of ischemic heart disease and other diseases of the circulatory system: Secondary | ICD-10-CM | POA: Diagnosis not present

## 2018-08-14 DIAGNOSIS — M5430 Sciatica, unspecified side: Secondary | ICD-10-CM | POA: Diagnosis present

## 2018-08-14 DIAGNOSIS — A419 Sepsis, unspecified organism: Secondary | ICD-10-CM | POA: Diagnosis present

## 2018-08-14 DIAGNOSIS — Z800 Family history of malignant neoplasm of digestive organs: Secondary | ICD-10-CM | POA: Diagnosis not present

## 2018-08-14 DIAGNOSIS — Z7982 Long term (current) use of aspirin: Secondary | ICD-10-CM | POA: Diagnosis not present

## 2018-08-14 DIAGNOSIS — G9341 Metabolic encephalopathy: Secondary | ICD-10-CM | POA: Diagnosis present

## 2018-08-14 DIAGNOSIS — Z87891 Personal history of nicotine dependence: Secondary | ICD-10-CM | POA: Diagnosis not present

## 2018-08-14 DIAGNOSIS — Z8551 Personal history of malignant neoplasm of bladder: Secondary | ICD-10-CM | POA: Diagnosis not present

## 2018-08-14 DIAGNOSIS — C679 Malignant neoplasm of bladder, unspecified: Secondary | ICD-10-CM | POA: Diagnosis not present

## 2018-08-14 DIAGNOSIS — R339 Retention of urine, unspecified: Secondary | ICD-10-CM | POA: Diagnosis not present

## 2018-08-14 DIAGNOSIS — N183 Chronic kidney disease, stage 3 (moderate): Secondary | ICD-10-CM | POA: Diagnosis present

## 2018-08-14 DIAGNOSIS — Z882 Allergy status to sulfonamides status: Secondary | ICD-10-CM | POA: Diagnosis not present

## 2018-08-14 DIAGNOSIS — Z8719 Personal history of other diseases of the digestive system: Secondary | ICD-10-CM | POA: Diagnosis not present

## 2018-08-14 DIAGNOSIS — Z880 Allergy status to penicillin: Secondary | ICD-10-CM | POA: Diagnosis not present

## 2018-08-14 DIAGNOSIS — R351 Nocturia: Secondary | ICD-10-CM | POA: Diagnosis not present

## 2018-08-14 DIAGNOSIS — M1990 Unspecified osteoarthritis, unspecified site: Secondary | ICD-10-CM | POA: Diagnosis present

## 2018-08-14 DIAGNOSIS — N179 Acute kidney failure, unspecified: Secondary | ICD-10-CM | POA: Diagnosis not present

## 2018-08-14 DIAGNOSIS — G9340 Encephalopathy, unspecified: Secondary | ICD-10-CM | POA: Diagnosis not present

## 2018-08-14 LAB — CREATININE, URINE, RANDOM: Creatinine, Urine: 63.29 mg/dL

## 2018-08-14 LAB — CBC WITH DIFFERENTIAL/PLATELET
Abs Immature Granulocytes: 0.03 10*3/uL (ref 0.00–0.07)
Basophils Absolute: 0 10*3/uL (ref 0.0–0.1)
Basophils Relative: 0 %
Eosinophils Absolute: 0.3 10*3/uL (ref 0.0–0.5)
Eosinophils Relative: 3 %
HCT: 29.1 % — ABNORMAL LOW (ref 39.0–52.0)
Hemoglobin: 9 g/dL — ABNORMAL LOW (ref 13.0–17.0)
Immature Granulocytes: 0 %
Lymphocytes Relative: 4 %
Lymphs Abs: 0.4 10*3/uL — ABNORMAL LOW (ref 0.7–4.0)
MCH: 30.4 pg (ref 26.0–34.0)
MCHC: 30.9 g/dL (ref 30.0–36.0)
MCV: 98.3 fL (ref 80.0–100.0)
Monocytes Absolute: 0.5 10*3/uL (ref 0.1–1.0)
Monocytes Relative: 5 %
Neutro Abs: 8.3 10*3/uL — ABNORMAL HIGH (ref 1.7–7.7)
Neutrophils Relative %: 88 %
Platelets: 165 10*3/uL (ref 150–400)
RBC: 2.96 MIL/uL — ABNORMAL LOW (ref 4.22–5.81)
RDW: 13.2 % (ref 11.5–15.5)
WBC: 9.6 10*3/uL (ref 4.0–10.5)
nRBC: 0 % (ref 0.0–0.2)

## 2018-08-14 LAB — BASIC METABOLIC PANEL
Anion gap: 8 (ref 5–15)
BUN: 47 mg/dL — ABNORMAL HIGH (ref 8–23)
CO2: 19 mmol/L — ABNORMAL LOW (ref 22–32)
Calcium: 7.6 mg/dL — ABNORMAL LOW (ref 8.9–10.3)
Chloride: 113 mmol/L — ABNORMAL HIGH (ref 98–111)
Creatinine, Ser: 1.89 mg/dL — ABNORMAL HIGH (ref 0.61–1.24)
GFR calc Af Amer: 36 mL/min — ABNORMAL LOW (ref >60)
GFR calc non Af Amer: 31 mL/min — ABNORMAL LOW (ref >60)
Glucose, Bld: 110 mg/dL — ABNORMAL HIGH (ref 70–99)
Potassium: 4.3 mmol/L (ref 3.5–5.1)
Sodium: 140 mmol/L (ref 135–145)

## 2018-08-14 LAB — SODIUM, URINE, RANDOM: Sodium, Ur: 59 mmol/L

## 2018-08-14 LAB — MRSA PCR SCREENING: MRSA by PCR: NEGATIVE

## 2018-08-14 MED ORDER — SODIUM CHLORIDE 0.9 % IV BOLUS
1000.0000 mL | Freq: Once | INTRAVENOUS | Status: AC
  Administered 2018-08-14: 1000 mL via INTRAVENOUS

## 2018-08-14 MED ORDER — LACTATED RINGERS IV BOLUS
500.0000 mL | Freq: Once | INTRAVENOUS | Status: AC
  Administered 2018-08-14: 500 mL via INTRAVENOUS

## 2018-08-14 MED ORDER — LACTATED RINGERS IV SOLN
INTRAVENOUS | Status: DC
  Administered 2018-08-14 – 2018-08-17 (×6): via INTRAVENOUS

## 2018-08-14 MED ORDER — SODIUM CHLORIDE 0.9 % IV BOLUS
500.0000 mL | Freq: Once | INTRAVENOUS | Status: AC
  Administered 2018-08-14: 500 mL via INTRAVENOUS

## 2018-08-14 MED ORDER — TRAZODONE HCL 50 MG PO TABS: 50.0000 mg | ORAL_TABLET | Freq: Once | ORAL | Status: DC

## 2018-08-14 MED ORDER — SODIUM CHLORIDE 0.9 % IV SOLN
INTRAVENOUS | Status: AC
  Administered 2018-08-14: 03:00:00 via INTRAVENOUS

## 2018-08-14 NOTE — Progress Notes (Addendum)
Received phone call from RN when pt arrived on unit concerning hypotension. Pt SBP 80-70's. Pt A/Ox4 and with no complaints. All other VS stable. RN states pt has gotten up to bedside commode with no incidence and appears to be asymptomatic. Multiple fluid boluses administered. Pt started on MIVF. RN advised for pt to remain on bedrest. Continue to monitor CBC, BP and other VS for changes. Consider pressors if pt becomes symptomatic.    Lovey Newcomer, NP [facility] 7p-7a [phone]

## 2018-08-14 NOTE — Progress Notes (Addendum)
PROGRESS NOTE    Benjamin Cardenas  YJE:[id number] DOB: 11/01/1929 DOA: 08/13/2018 PCP: Isaac Bliss, Rayford Halsted, MD    Brief Narrative:  85 year old male who presented with lethargy and confusion.  He has history of bladder cancer,  BPH and chronic constipation.  Reported 24 hours of lethargy and confusion.  The patient complained of abdominal pain, with difficulty urinating.  On her initial physical examination her blood pressure was 84/57, heart rate 75, respiratory rate 12, temperature 98.6, oxygen saturation 100%.  He had moist mucous membranes, his lungs were clear to auscultation bilaterally, heart S1-S2 present and rhythmic the abdomen had suprapubic tenderness, no lower extremity edema.  Sodium 137, potassium 4.8, chloride 106, bicarb 21, glucose 115, BUN 57, creatinine 2.29, white count 17.2, hemoglobin 10.7, hematocrit 33.8, platelets 238.  SARS-Covid19 negative, urinalysis with more than 50 white cells, 21-50 red cells.  Abdominal CT with development of new severe bilateral hydronephrosis, likely related to bladder outlet obstruction.  3.1 cm infrarenal abdominal aortic aneurysm.  His chest x-ray was negative for infiltrates.  Patient was admitted to the hospital working diagnosis of sepsis due to urinary tract infection.  Assessment & Plan:   Principal Problem:   Sepsis secondary to UTI [facility]) Active Problems:   BPH (benign prostatic hyperplasia)   Bladder cancer ([place])   AKI (acute kidney injury) ([place])   Acute encephalopathy   Bladder outlet obstruction   AAA (abdominal aortic aneurysm) without rupture ([place])   1. Sepsis due to complicated urine infection (present on admission), obstructive uropathy. Patient with episodic hypotension, improved with IV fluids, his wbc is 9,6 and has remained aferbrile . Will continue IV saline bolus and LR for maintenance. Antibiotic therapy with ceftriaxone. Foley catheter in place, will consult urology in am.   2. AKI on CKD stage 3b with non  gap metabolic acidosis. Serum cr today down to 1,89 from 2,29 with K at 4,3 and serum bicarbonate at 19. Will continue hydration with balanced electrolyte solutions and bolus with NS. Avoid hypotension and nephrotoxic medications.   3. Metabolic encephalopathy. This am is more awake and alert, no agitation. Positive confusion.   4. AAA. 3,1 cm infrarenal AAA, will need outpatient follow up.   DVT prophylaxis: enoxaparin   Code Status:  fill Family Communication: no family at the bedside  Disposition Plan/ discharge barriers: pending clinical improvement.   Body mass index is 22.55 kg/m. Malnutrition Type:      Malnutrition Characteristics:      Nutrition Interventions:     RN Pressure Injury Documentation:     Consultants:     Procedures:     Antimicrobials:   Ceftriaxone IV     Subjective: Patient is feeling better this am, but continue to be very weak and deconditioned, not yet back to baseline, no dyspnea or chest pain, No nausea or vomiting.   Objective: Vitals:   08/14/18 0645 08/14/18 0700 08/14/18 0715 08/14/18 0800  BP: (!) 78/42 (!) 89/46 98/63 (!) 73/45  Pulse: (!) 52 (!) 51 67 60  Resp: (!) 23 16 18 15   Temp:    98.4 F (36.9 C)  TempSrc:    Oral  SpO2: 100% 97% 100% 100%  Weight:      Height:        Intake/Output Summary (Last 24 hours) at 08/14/2018 0849 Last data filed at 08/14/2018 0746 Gross per 24 hour  Intake 2601.91 ml  Output 1150 ml  Net 1451.91 ml   Filed Weights   08/13/18 1844  08/14/18 0000  Weight: 64.4 kg 65.3 kg    Examination:   General: deconditioned and ill looking appearing  Neurology: Awake and alert, non focal, confused   E ENT: positive pallor, no icterus, oral mucosa moist Cardiovascular: No JVD. S1-S2 present, rhythmic, no gallops, rubs, or murmurs. Trace lower extremity edema. Pulmonary: positive breath sounds bilaterally, adequate air movement, no wheezing, rhonchi or rales. Gastrointestinal. Abdomen  with no organomegaly, tender to palpation at the suprapubic region, no rebound or guarding Skin. No rashes Musculoskeletal: no joint deformities     Data Reviewed: I have personally reviewed following labs and imaging studies  CBC: Recent Labs  Lab 08/13/18 1937 08/14/18 0247  WBC 17.2* 9.6  NEUTROABS 16.2* 8.3*  HGB 10.7* 9.0*  HCT 33.8* 29.1*  MCV 96.3 98.3  PLT 238 364   Basic Metabolic Panel: Recent Labs  Lab 08/13/18 1937 08/14/18 0247  NA 137 140  K 4.8 4.3  CL 106 113*  CO2 21* 19*  GLUCOSE 115* 110*  BUN 57* 47*  CREATININE 2.29* 1.89*  CALCIUM 8.8* 7.6*   GFR: Estimated Creatinine Clearance: 25.9 mL/min (A) (by C-G formula based on SCr of 1.89 mg/dL (H)). Liver Function Tests: Recent Labs  Lab 08/13/18 1937  AST 23  ALT 16  ALKPHOS 88  BILITOT 0.7  PROT 6.9  ALBUMIN 3.6   No results for input(s): LIPASE, AMYLASE in the last 168 hours. No results for input(s): AMMONIA in the last 168 hours. Coagulation Profile: No results for input(s): INR, PROTIME in the last 168 hours. Cardiac Enzymes: No results for input(s): CKTOTAL, CKMB, CKMBINDEX, TROPONINI in the last 168 hours. BNP (last 3 results) No results for input(s): PROBNP in the last 8760 hours. HbA1C: No results for input(s): HGBA1C in the last 72 hours. CBG: No results for input(s): GLUCAP in the last 168 hours. Lipid Profile: No results for input(s): CHOL, HDL, LDLCALC, TRIG, CHOLHDL, LDLDIRECT in the last 72 hours. Thyroid Function Tests: No results for input(s): TSH, T4TOTAL, FREET4, T3FREE, THYROIDAB in the last 72 hours. Anemia Panel: No results for input(s): VITAMINB12, FOLATE, FERRITIN, TIBC, IRON, RETICCTPCT in the last 72 hours.    Radiology Studies: I have reviewed all of the imaging during this hospital visit personally     Scheduled Meds: . Chlorhexidine Gluconate Cloth  6 each Topical Daily   Continuous Infusions: . sodium chloride 100 mL/hr at 08/14/18 0746  .  cefTRIAXone (ROCEPHIN)  IV       LOS: 0 days        Kylena Mole Gerome Apley, MD

## 2018-08-15 LAB — URINE CULTURE: Culture: NO GROWTH

## 2018-08-15 LAB — CBC WITH DIFFERENTIAL/PLATELET
Abs Immature Granulocytes: 0.02 10*3/uL (ref 0.00–0.07)
Basophils Absolute: 0 10*3/uL (ref 0.0–0.1)
Basophils Relative: 0 %
Eosinophils Absolute: 0.4 10*3/uL (ref 0.0–0.5)
Eosinophils Relative: 5 %
HCT: 28.1 % — ABNORMAL LOW (ref 39.0–52.0)
Hemoglobin: 8.9 g/dL — ABNORMAL LOW (ref 13.0–17.0)
Immature Granulocytes: 0 %
Lymphocytes Relative: 11 %
Lymphs Abs: 0.9 10*3/uL (ref 0.7–4.0)
MCH: 31 pg (ref 26.0–34.0)
MCHC: 31.7 g/dL (ref 30.0–36.0)
MCV: 97.9 fL (ref 80.0–100.0)
Monocytes Absolute: 0.5 10*3/uL (ref 0.1–1.0)
Monocytes Relative: 6 %
Neutro Abs: 6 10*3/uL (ref 1.7–7.7)
Neutrophils Relative %: 78 %
Platelets: 162 10*3/uL (ref 150–400)
RBC: 2.87 MIL/uL — ABNORMAL LOW (ref 4.22–5.81)
RDW: 13.3 % (ref 11.5–15.5)
WBC: 7.9 10*3/uL (ref 4.0–10.5)
nRBC: 0 % (ref 0.0–0.2)

## 2018-08-15 LAB — BASIC METABOLIC PANEL
Anion gap: 8 (ref 5–15)
BUN: 37 mg/dL — ABNORMAL HIGH (ref 8–23)
CO2: 19 mmol/L — ABNORMAL LOW (ref 22–32)
Calcium: 8.1 mg/dL — ABNORMAL LOW (ref 8.9–10.3)
Chloride: 113 mmol/L — ABNORMAL HIGH (ref 98–111)
Creatinine, Ser: 1.56 mg/dL — ABNORMAL HIGH (ref 0.61–1.24)
GFR calc Af Amer: 46 mL/min — ABNORMAL LOW (ref >60)
GFR calc non Af Amer: 40 mL/min — ABNORMAL LOW (ref >60)
Glucose, Bld: 95 mg/dL (ref 70–99)
Potassium: 3.7 mmol/L (ref 3.5–5.1)
Sodium: 140 mmol/L (ref 135–145)

## 2018-08-15 MED ORDER — POLYVINYL ALCOHOL 1.4 % OP SOLN
1.0000 [drp] | OPHTHALMIC | Status: DC | PRN
  Administered 2018-08-15 (×2): 1 [drp] via OPHTHALMIC
  Filled 2018-08-15: qty 15

## 2018-08-15 MED ORDER — FINASTERIDE 5 MG PO TABS
5.0000 mg | ORAL_TABLET | Freq: Every day | ORAL | Status: DC
  Administered 2018-08-15 – 2018-08-17 (×3): 5 mg via ORAL
  Filled 2018-08-15 (×3): qty 1

## 2018-08-15 MED ORDER — ALFUZOSIN HCL ER 10 MG PO TB24
10.0000 mg | ORAL_TABLET | Freq: Every day | ORAL | Status: DC
  Administered 2018-08-15 – 2018-08-17 (×3): 10 mg via ORAL
  Filled 2018-08-15 (×4): qty 1

## 2018-08-15 NOTE — Progress Notes (Signed)
PROGRESS NOTE    Benjamin Cardenas  OBS:[id number] DOB: [birth year] DOA: 08/13/2018 PCP: Isaac Bliss, Rayford Halsted, MD    Brief Narrative:  85 year old male who presented with lethargy and confusion.  He has history of bladder cancer,  BPH and chronic constipation.  Reported 24 hours of lethargy and confusion.  The patient complained of abdominal pain, with difficulty urinating.  On her initial physical examination her blood pressure was 84/57, heart rate 75, respiratory rate 12, temperature 98.6, oxygen saturation 100%.  He had moist mucous membranes, his lungs were clear to auscultation bilaterally, heart S1-S2 present and rhythmic the abdomen had suprapubic tenderness, no lower extremity edema.  Sodium 137, potassium 4.8, chloride 106, bicarb 21, glucose 115, BUN 57, creatinine 2.29, white count 17.2, hemoglobin 10.7, hematocrit 33.8, platelets 238.  SARS-Covid19 negative, urinalysis with more than 50 white cells, 21-50 red cells.  Abdominal CT with development of new severe bilateral hydronephrosis, likely related to bladder outlet obstruction.  3.1 cm infrarenal abdominal aortic aneurysm.  His chest x-ray was negative for infiltrates.  Patient was admitted to the hospital working diagnosis of sepsis due to urinary tract infection.   Assessment & Plan:   Principal Problem:   Sepsis secondary to UTI [facility]) Active Problems:   BPH (benign prostatic hyperplasia)   Bladder cancer ([place])   AKI (acute kidney injury) ([place])   Acute encephalopathy   Bladder outlet obstruction   AAA (abdominal aortic aneurysm) without rupture ([place])   Sepsis due to urinary tract infection ([place])  1. Sepsis due to complicated urine infection (present on admission), obstructive uropathy. Blood pressure has been low normal, will target MAP of 60 mmHg, wbc trending down to 7.9. Cultures have remained with no growth. Will continue LR at 100 ml/H. Patient with high urine output due to post-obstruction, will continue bolus of  NS for episodic hypotension. Will continue to follow cell count, cultures and temperature curve. I spoke with urology over the phone, plan to continue foley catheter, add finasteride and afluzosin, follow up as outpatient on 5.19.20.   2. AKI on CKD stage 3b with non gap metabolic acidosis. Continue renal function improvement with serum cr at 1,56, K at 3,7 and serum bicarbonate at 19. Patient with postobstructive polyuria, with urine output 4,650 ml over last 24 H. Will continue hydration with balanced electrolyte solutions and avoid hypotension, keep MAP more than 60. Keep foley catheter at discharge.    3. Metabolic encephalopathy. Mentation continue to improve, will follow with physical therapy evaluation.   4. AAA. 3,1 cm infrarenal AAA. Follow as outpatient.   DVT prophylaxis: enoxaparin   Code Status:  fill Family Communication: no family at the bedside  Disposition Plan/ discharge barriers: pending clinical improvement.    Body mass index is 23.79 kg/m. Malnutrition Type:      Malnutrition Characteristics:      Nutrition Interventions:     RN Pressure Injury Documentation:     Consultants:   Urology over the phone   Procedures:    Antimicrobials:   IV ceftriaxone     Subjective: Patient is feeling better, improved back pain, no nausea or vomiting. Still somnolent by the time of my examination.   Objective: Vitals:   08/15/18 0500 08/15/18 0600 08/15/18 0700 08/15/18 0730  BP: (!) 106/55 (!) 107/56 (!) 94/47 105/65  Pulse: 65 65 (!) 58 60  Resp: 16 17 18 13   Temp:      TempSrc:      SpO2: 97% 94% 100% 100%  Weight: 68.9 kg     Height:        Intake/Output Summary (Last 24 hours) at 08/15/2018 0812 Last data filed at 08/15/2018 0750 Gross per 24 hour  Intake 3057.74 ml  Output 4950 ml  Net -1892.26 ml   Filed Weights   08/13/18 1844 08/14/18 0000 08/15/18 0500  Weight: 64.4 kg 65.3 kg 68.9 kg    Examination:   General: deconditioned   Neurology: Awake and alert, non focal  E ENT: mild pallor, no icterus, oral mucosa moist Cardiovascular: No JVD. S1-S2 present, rhythmic, no gallops, rubs, or murmurs. No lower extremity edema. Pulmonary: positive breath sounds bilaterally, adequate air movement, no wheezing, rhonchi or rales. Gastrointestinal. Abdomen with no organomegaly, non tender, no rebound or guarding Skin. No rashes Musculoskeletal: no joint deformities Foley catheter in place.     Data Reviewed: I have personally reviewed following labs and imaging studies  CBC: Recent Labs  Lab 08/13/18 1937 08/14/18 0247 08/15/18 0227  WBC 17.2* 9.6 7.9  NEUTROABS 16.2* 8.3* 6.0  HGB 10.7* 9.0* 8.9*  HCT 33.8* 29.1* 28.1*  MCV 96.3 98.3 97.9  PLT 238 165 546   Basic Metabolic Panel: Recent Labs  Lab 08/13/18 1937 08/14/18 0247 08/15/18 0227  NA 137 140 140  K 4.8 4.3 3.7  CL 106 113* 113*  CO2 21* 19* 19*  GLUCOSE 115* 110* 95  BUN 57* 47* 37*  CREATININE 2.29* 1.89* 1.56*  CALCIUM 8.8* 7.6* 8.1*   GFR: Estimated Creatinine Clearance: 31.8 mL/min (A) (by C-G formula based on SCr of 1.56 mg/dL (H)). Liver Function Tests: Recent Labs  Lab 08/13/18 1937  AST 23  ALT 16  ALKPHOS 88  BILITOT 0.7  PROT 6.9  ALBUMIN 3.6   No results for input(s): LIPASE, AMYLASE in the last 168 hours. No results for input(s): AMMONIA in the last 168 hours. Coagulation Profile: No results for input(s): INR, PROTIME in the last 168 hours. Cardiac Enzymes: No results for input(s): CKTOTAL, CKMB, CKMBINDEX, TROPONINI in the last 168 hours. BNP (last 3 results) No results for input(s): PROBNP in the last 8760 hours. HbA1C: No results for input(s): HGBA1C in the last 72 hours. CBG: No results for input(s): GLUCAP in the last 168 hours. Lipid Profile: No results for input(s): CHOL, HDL, LDLCALC, TRIG, CHOLHDL, LDLDIRECT in the last 72 hours. Thyroid Function Tests: No results for input(s): TSH, T4TOTAL, FREET4,  T3FREE, THYROIDAB in the last 72 hours. Anemia Panel: No results for input(s): VITAMINB12, FOLATE, FERRITIN, TIBC, IRON, RETICCTPCT in the last 72 hours.    Radiology Studies: I have reviewed all of the imaging during this hospital visit personally     Scheduled Meds: . Chlorhexidine Gluconate Cloth  6 each Topical Daily  . traZODone  50 mg Oral Once   Continuous Infusions: . cefTRIAXone (ROCEPHIN)  IV Stopped (08/14/18 2122)  . lactated ringers 100 mL/hr at 08/15/18 0500     LOS: 1 day         Gerome Apley, MD

## 2018-08-16 ENCOUNTER — Encounter: Payer: Medicare Other | Admitting: Physical Therapy

## 2018-08-16 LAB — BASIC METABOLIC PANEL
Anion gap: 8 (ref 5–15)
BUN: 28 mg/dL — ABNORMAL HIGH (ref 8–23)
CO2: 22 mmol/L (ref 22–32)
Calcium: 8.4 mg/dL — ABNORMAL LOW (ref 8.9–10.3)
Chloride: 110 mmol/L (ref 98–111)
Creatinine, Ser: 1.26 mg/dL — ABNORMAL HIGH (ref 0.61–1.24)
GFR calc Af Amer: 59 mL/min — ABNORMAL LOW (ref >60)
GFR calc non Af Amer: 51 mL/min — ABNORMAL LOW (ref >60)
Glucose, Bld: 91 mg/dL (ref 70–99)
Potassium: 3.3 mmol/L — ABNORMAL LOW (ref 3.5–5.1)
Sodium: 140 mmol/L (ref 135–145)

## 2018-08-16 LAB — CBC WITH DIFFERENTIAL/PLATELET
Abs Immature Granulocytes: 0.01 10*3/uL (ref 0.00–0.07)
Basophils Absolute: 0 10*3/uL (ref 0.0–0.1)
Basophils Relative: 1 %
Eosinophils Absolute: 0.6 10*3/uL — ABNORMAL HIGH (ref 0.0–0.5)
Eosinophils Relative: 9 %
HCT: 29.9 % — ABNORMAL LOW (ref 39.0–52.0)
Hemoglobin: 9.8 g/dL — ABNORMAL LOW (ref 13.0–17.0)
Immature Granulocytes: 0 %
Lymphocytes Relative: 14 %
Lymphs Abs: 0.9 10*3/uL (ref 0.7–4.0)
MCH: 31 pg (ref 26.0–34.0)
MCHC: 32.8 g/dL (ref 30.0–36.0)
MCV: 94.6 fL (ref 80.0–100.0)
Monocytes Absolute: 0.4 10*3/uL (ref 0.1–1.0)
Monocytes Relative: 7 %
Neutro Abs: 4.3 10*3/uL (ref 1.7–7.7)
Neutrophils Relative %: 69 %
Platelets: 185 10*3/uL (ref 150–400)
RBC: 3.16 MIL/uL — ABNORMAL LOW (ref 4.22–5.81)
RDW: 13.2 % (ref 11.5–15.5)
WBC: 6.2 10*3/uL (ref 4.0–10.5)
nRBC: 0 % (ref 0.0–0.2)

## 2018-08-16 MED ORDER — POTASSIUM CHLORIDE CRYS ER 20 MEQ PO TBCR
40.0000 meq | EXTENDED_RELEASE_TABLET | Freq: Once | ORAL | Status: AC
  Administered 2018-08-16: 40 meq via ORAL
  Filled 2018-08-16: qty 2

## 2018-08-16 NOTE — Progress Notes (Signed)
PROGRESS NOTE    Benjamin Cardenas  QRF:[id number] DOB: October 06, 1929 DOA: 08/13/2018 PCP: Isaac Bliss, Rayford Halsted, MD    Brief Narrative:  85 year old male who presented with lethargy and confusion.He has history of bladder cancer, BPH and chronic constipation. Reported 24 hours of lethargy and confusion. The patient complained of abdominal pain,with difficulty urinating.On his initial physical examination his blood pressure was 84/57, heart rate 75, respiratory rate 12, temperature 98.6, oxygen saturation 100%. He hadmoist mucous membranes,hislungs wereclear to auscultation bilaterally, heart S1-S2 present and rhythmic the abdomen hadsuprapubic tenderness,no lower extremity edema. Sodium 137, potassium 4.8, chloride 106, bicarb 21, glucose 115, BUN 57, creatinine 2.29, white count 17.2, hemoglobin 10.7, hematocrit 33.8, platelets 238.SARS-Covid19 negative, urinalysis with more than 50 white cells, 21-50 red cells.Abdominal CT with development of newsevere bilateral hydronephrosis, likely related to bladder outlet obstruction.3.1 cm infrarenal abdominal aortic aneurysm. His chest x-ray was negative for infiltrates.  Patient was admitted to the hospital working diagnosis of sepsis due to urinary tract infection.   Assessment & Plan:   Principal Problem:   Sepsis secondary to UTI [facility]) Active Problems:   BPH (benign prostatic hyperplasia)   Bladder cancer ([place])   AKI (acute kidney injury) ([place])   Acute encephalopathy   Bladder outlet obstruction   AAA (abdominal aortic aneurysm) without rupture ([place])   Sepsis due to urinary tract infection ([place])   1. Sepsis due to complicated urinary tract infection(present on admission), obstructive uropathy. Over last 24 H his blood pressure has been stable, systolic at 826 to 415 mmHg. Positive postobstruction diuresis with large urine output, 3,250 ML, will continue IV fluids with LR at 100 ml/H. He has remained afebrile and wbc  down to 6,2. Cultures have been no growth, continue antibiotic therapy with IV ceftriaxone.  Plan to continue foley catheter, finasteride and afluzosin at discharge, follow up as outpatient with Urology on 5.19.20. (case discussed with Urology over the phone)    2. AKI on CKD stage 3b with non gap metabolic acidosis and hypokalemia. Postobstructive polyuria, with urine output 3.250 ml over last 24 H. Renal function with serum cr trending down to 1,26 with K at 3,, and serum bicarbonate at 22 from 19. Patient tolerating po well, continue K correction with Kcl and hydration balanced electrolyte solutions.   3. Metabolic encephalopathy. continue to improve mentation, likely close to his baseline, will need physical therapy evaluation before discharge.  4. AAA. 3,1 cm infrarenal AAA. Will need outpatient follow up.  DVT prophylaxis:enoxaparin Code Status:fill Family Communication:no family at the bedside Disposition Plan/ discharge barriers:possible dc in am 05.13. if continue clinical improvement, follow with physical therapy recommendations.    Body mass index is 23.76 kg/m. Malnutrition Type:      Malnutrition Characteristics:      Nutrition Interventions:     RN Pressure Injury Documentation:     Consultants:   Urology over the phone   Procedures:     Antimicrobials:   IV ceftriaxone     Subjective: Patient has been feeling better,but not yet back to baseline, continue to have back and hip pain, no nausea or vomiting, no chest pain or dyspnea, mild confusion but not agitation.   Objective: Vitals:   08/15/18 2136 08/16/18 0042 08/16/18 0341 08/16/18 0600  BP: 124/69 117/68 129/78   Pulse: 71 72 75   Resp: 18 16 20    Temp: 98.5 F (36.9 C) 97.6 F (36.4 C) 97.8 F (36.6 C)   TempSrc: Oral Oral Oral   SpO2: 97%  96% 96%   Weight:    68.8 kg  Height:        Intake/Output Summary (Last 24 hours) at 08/16/2018 1002 Last data filed at 08/16/2018  0948 Gross per 24 hour  Intake 3222.29 ml  Output 4800 ml  Net -1577.71 ml   Filed Weights   08/14/18 0000 08/15/18 0500 08/16/18 0600  Weight: 65.3 kg 68.9 kg 68.8 kg    Examination:   General: deconditioned  Neurology: Awake and alert, non focal  E ENT: mild pallor, no icterus, oral mucosa moist Cardiovascular: No JVD. S1-S2 present, rhythmic, no gallops, rubs, or murmurs. No  lower extremity edema. Pulmonary: positive breath sounds bilaterally, adequate air movement, no wheezing, rhonchi or rales. Gastrointestinal. Abdomen with  no organomegaly, non tender, no rebound or guarding Skin. No rashes Musculoskeletal: no joint deformities Foley in place with clear urine.     Data Reviewed: I have personally reviewed following labs and imaging studies  CBC: Recent Labs  Lab 08/13/18 1937 08/14/18 0247 08/15/18 0227 08/16/18 0431  WBC 17.2* 9.6 7.9 6.2  NEUTROABS 16.2* 8.3* 6.0 4.3  HGB 10.7* 9.0* 8.9* 9.8*  HCT 33.8* 29.1* 28.1* 29.9*  MCV 96.3 98.3 97.9 94.6  PLT 238 165 162 902   Basic Metabolic Panel: Recent Labs  Lab 08/13/18 1937 08/14/18 0247 08/15/18 0227 08/16/18 0431  NA 137 140 140 140  K 4.8 4.3 3.7 3.3*  CL 106 113* 113* 110  CO2 21* 19* 19* 22  GLUCOSE 115* 110* 95 91  BUN 57* 47* 37* 28*  CREATININE 2.29* 1.89* 1.56* 1.26*  CALCIUM 8.8* 7.6* 8.1* 8.4*   GFR: Estimated Creatinine Clearance: 39.3 mL/min (A) (by C-G formula based on SCr of 1.26 mg/dL (H)). Liver Function Tests: Recent Labs  Lab 08/13/18 1937  AST 23  ALT 16  ALKPHOS 88  BILITOT 0.7  PROT 6.9  ALBUMIN 3.6   No results for input(s): LIPASE, AMYLASE in the last 168 hours. No results for input(s): AMMONIA in the last 168 hours. Coagulation Profile: No results for input(s): INR, PROTIME in the last 168 hours. Cardiac Enzymes: No results for input(s): CKTOTAL, CKMB, CKMBINDEX, TROPONINI in the last 168 hours. BNP (last 3 results) No results for input(s): PROBNP in the last  8760 hours. HbA1C: No results for input(s): HGBA1C in the last 72 hours. CBG: No results for input(s): GLUCAP in the last 168 hours. Lipid Profile: No results for input(s): CHOL, HDL, LDLCALC, TRIG, CHOLHDL, LDLDIRECT in the last 72 hours. Thyroid Function Tests: No results for input(s): TSH, T4TOTAL, FREET4, T3FREE, THYROIDAB in the last 72 hours. Anemia Panel: No results for input(s): VITAMINB12, FOLATE, FERRITIN, TIBC, IRON, RETICCTPCT in the last 72 hours.    Radiology Studies: I have reviewed all of the imaging during this hospital visit personally     Scheduled Meds: . alfuzosin  10 mg Oral Q breakfast  . finasteride  5 mg Oral Daily  . traZODone  50 mg Oral Once   Continuous Infusions: . cefTRIAXone (ROCEPHIN)  IV Stopped (08/15/18 2218)  . lactated ringers 100 mL/hr at 08/16/18 0600     LOS: 2 days        Acie Custis Gerome Apley, MD

## 2018-08-16 NOTE — Evaluation (Addendum)
Physical Therapy Evaluation Patient Details Name: Benjamin Cardenas MRN: [id number] DOB: [birth year] Today's Date: 08/16/2018   History of Present Illness  85 y.o. male with medical history significant for bladder cancer, BPH, and chronic constipation, now presenting to the emergency department with 1 day of lethargy and confusion. Dx of sepsis, UTI.   Clinical Impression  Pt admitted with above diagnosis. Pt currently with functional limitations due to the deficits listed below (see PT Problem List). Pt ambulated 200' with RW with verbal cues for posture and safe positioning in RW. Distance limited by R hip and knee pain, pt attributes this to sciatica. HHPT and rollator recommended.  Pt will benefit from skilled PT to increase their independence and safety with mobility to allow discharge to the venue listed below.       Follow Up Recommendations Home health PT;Supervision for mobility/OOB    Equipment Recommendations  Other (comment)(4 wheeled RW (rollator))    Recommendations for Other Services       Precautions / Restrictions Precautions Precautions: Fall Precaution Comments: pt denies h/o falls in past 1 year Restrictions Weight Bearing Restrictions: No      Mobility  Bed Mobility Overal bed mobility: Modified Independent             General bed mobility comments: HOB up  Transfers Overall transfer level: Needs assistance Equipment used: None Transfers: Sit to/from Stand Sit to Stand: Min guard         General transfer comment: min/guard for safety, VCs hand placement  Ambulation/Gait Ambulation/Gait assistance: Min guard Gait Distance (Feet): 200 Feet Assistive device: Rolling walker (2 wheeled) Gait Pattern/deviations: Step-through pattern;Decreased stride length;Trunk flexed Gait velocity: WFL   General Gait Details: steady, no loss of balance, distance limited by fatigue and R hip/knee pain (pt attributes this to sciatica), VCs for posture and to step  closer to frame of RW  Stairs            Wheelchair Mobility    Modified Rankin (Stroke Patients Only)       Balance Overall balance assessment: Modified Independent                                           Pertinent Vitals/Pain Pain Assessment: 0-10 Pain Score: 9  Pain Location: R hip and knee Pain Descriptors / Indicators: Sore Pain Intervention(s): Monitored during session;Limited activity within patient's tolerance;Patient requesting pain meds-RN notified;Ice applied    Home Living Family/patient expects to be discharged to:: Private residence Living Arrangements: Spouse/significant other Available Help at Discharge: Family;Available 24 hours/day Type of Home: House Home Access: Stairs to enter Entrance Stairs-Rails: Right Entrance Stairs-Number of Steps: 4 Home Layout: Two level Home Equipment: None      Prior Function Level of Independence: Independent               Hand Dominance        Extremity/Trunk Assessment   Upper Extremity Assessment Upper Extremity Assessment: Overall WFL for tasks assessed    Lower Extremity Assessment Lower Extremity Assessment: Overall WFL for tasks assessed    Cervical / Trunk Assessment Cervical / Trunk Assessment: Kyphotic  Communication   Communication: No difficulties  Cognition Arousal/Alertness: Awake/alert Behavior During Therapy: WFL for tasks assessed/performed Overall Cognitive Status: No family/caregiver present to determine baseline cognitive functioning  General Comments: oriented to self, location, situation; increased processing time and some word finding difficulty      General Comments      Exercises     Assessment/Plan    PT Assessment Patient needs continued PT services  PT Problem List Decreased mobility;Decreased activity tolerance;Pain;Decreased knowledge of use of DME       PT Treatment Interventions Gait  training;Therapeutic activities;Therapeutic exercise;Functional mobility training;Patient/family education    PT Goals (Current goals can be found in the Care Plan section)  Acute Rehab PT Goals Patient Stated Goal: to get stronger PT Goal Formulation: With patient Time For Goal Achievement: 08/30/18 Potential to Achieve Goals: Good    Frequency Min 3X/week   Barriers to discharge        Co-evaluation               AM-PAC PT "6 Clicks" Mobility  Outcome Measure Help needed turning from your back to your side while in a flat bed without using bedrails?: None Help needed moving from lying on your back to sitting on the side of a flat bed without using bedrails?: A Little Help needed moving to and from a bed to a chair (including a wheelchair)?: A Little Help needed standing up from a chair using your arms (e.g., wheelchair or bedside chair)?: None Help needed to walk in hospital room?: A Little Help needed climbing 3-5 steps with a railing? : A Little 6 Click Score: 20    End of Session Equipment Utilized During Treatment: Gait belt Activity Tolerance: Patient tolerated treatment well Patient left: in chair;with chair alarm set;with call bell/phone within reach Nurse Communication: Mobility status;Patient requests pain meds PT Visit Diagnosis: Difficulty in walking, not elsewhere classified (R26.2);Pain Pain - Right/Left: Right Pain - part of body: Hip;Knee    Time: 1059-1130 PT Time Calculation (min) (ACUTE ONLY): 31 min   Charges:   PT Evaluation $PT Eval Low Complexity: 1 Low PT Treatments $Gait Training: 8-22 mins       Blondell Reveal Kistler PT 08/16/2018  Acute Rehabilitation Services Pager [phone] Office [phone]

## 2018-08-17 LAB — CBC WITH DIFFERENTIAL/PLATELET
Abs Immature Granulocytes: 0.03 10*3/uL (ref 0.00–0.07)
Basophils Absolute: 0 10*3/uL (ref 0.0–0.1)
Basophils Relative: 0 %
Eosinophils Absolute: 0.7 10*3/uL — ABNORMAL HIGH (ref 0.0–0.5)
Eosinophils Relative: 9 %
HCT: 30.4 % — ABNORMAL LOW (ref 39.0–52.0)
Hemoglobin: 9.6 g/dL — ABNORMAL LOW (ref 13.0–17.0)
Immature Granulocytes: 0 %
Lymphocytes Relative: 15 %
Lymphs Abs: 1.2 10*3/uL (ref 0.7–4.0)
MCH: 30 pg (ref 26.0–34.0)
MCHC: 31.6 g/dL (ref 30.0–36.0)
MCV: 95 fL (ref 80.0–100.0)
Monocytes Absolute: 0.5 10*3/uL (ref 0.1–1.0)
Monocytes Relative: 7 %
Neutro Abs: 5.3 10*3/uL (ref 1.7–7.7)
Neutrophils Relative %: 69 %
Platelets: 208 10*3/uL (ref 150–400)
RBC: 3.2 MIL/uL — ABNORMAL LOW (ref 4.22–5.81)
RDW: 13.1 % (ref 11.5–15.5)
WBC: 7.8 10*3/uL (ref 4.0–10.5)
nRBC: 0 % (ref 0.0–0.2)

## 2018-08-17 LAB — BASIC METABOLIC PANEL
Anion gap: 7 (ref 5–15)
BUN: 25 mg/dL — ABNORMAL HIGH (ref 8–23)
CO2: 23 mmol/L (ref 22–32)
Calcium: 8.8 mg/dL — ABNORMAL LOW (ref 8.9–10.3)
Chloride: 107 mmol/L (ref 98–111)
Creatinine, Ser: 1.11 mg/dL (ref 0.61–1.24)
GFR calc Af Amer: >60 mL/min (ref >60)
GFR calc non Af Amer: 60 mL/min — ABNORMAL LOW (ref >60)
Glucose, Bld: 92 mg/dL (ref 70–99)
Potassium: 3.5 mmol/L (ref 3.5–5.1)
Sodium: 137 mmol/L (ref 135–145)

## 2018-08-17 MED ORDER — CEPHALEXIN 500 MG PO CAPS: 500.0000 mg | ORAL_CAPSULE | Freq: Three times a day (TID) | ORAL | 0 refills | Status: AC

## 2018-08-17 MED ORDER — TRAMADOL HCL 50 MG PO TABS: 50.0000 mg | ORAL_TABLET | Freq: Four times a day (QID) | ORAL | 0 refills | Status: AC | PRN

## 2018-08-17 MED ORDER — ALFUZOSIN HCL ER 10 MG PO TB24: 10.0000 mg | ORAL_TABLET | Freq: Every day | ORAL | 0 refills | Status: AC

## 2018-08-17 NOTE — Care Management Important Message (Signed)
Important Message  Patient Details IM Letter given to Baxter Kail SW to present to the Patient Name: Travaughn Vue MRN: [id number] Date of Birth: 10/31/1929   Medicare Important Message Given:  Yes    Kerin Salen 08/17/2018, 10:47 AMImportant Message  Patient Details  Name: Daisy Mcneel MRN: [id number] Date of Birth: March 12, 1930   Medicare Important Message Given:  Yes    Kerin Salen 08/17/2018, 10:47 AM

## 2018-08-17 NOTE — Discharge Summary (Signed)
Physician Discharge Summary  Benjamin Cardenas MVH:[id number] DOB: [birth year] DOA: 08/13/2018  PCP: Isaac Bliss, Rayford Halsted, MD  Admit date: 08/13/2018 Discharge date: 08/17/2018  Admitted From: Home Disposition: Home with home health care  Recommendations for Outpatient Follow-up:  1. Follow up with PCP in 1-2 weeks 2. Follow-up with urologist as scheduled.  Home Health: PT/OT/RN Equipment/Devices: None  Discharge Condition: Stable CODE STATUS: Full code Diet recommendation: Regular diet.  Brief/Interim Summary: 85 year old gentleman with history of bladder cancer, BPH and chronic constipation who was admitted to the hospital with 24 hours of lethargy and confusion, abdominal pain and difficulty urinating.  In the emergency room, he was found with low blood pressures.  Acute renal failure with creatinine of 2.29 and WBC count of 17.2.  Urinalysis with evidence of infection.  Abdominal CT scan with development of new severe bilateral hydronephrosis.  Patient was admitted to the hospital and treated for urinary obstruction and UTI.  Followed by urology.  Discharge Diagnoses:  Principal Problem:   Sepsis secondary to UTI [facility]) Active Problems:   BPH (benign prostatic hyperplasia)   Bladder cancer (HCC)   AKI (acute kidney injury) ([place])   Acute encephalopathy   Bladder outlet obstruction   AAA (abdominal aortic aneurysm) without rupture (HCC)   Sepsis due to urinary tract infection ([place])  Sepsis due to complicated UTI present on admission with obstructive uropathy: Clinically improved.  Blood cultures and urine cultures negative.  Foley catheter placed with improvement of renal functions, leukocytosis and electrolyte abnormalities. Patient was treated with Rocephin IV.  With no positive cultures, will treat with Keflex 500 mg 3 times a day for 10 more days. Seen by urology.  Patient is a scheduled for urology follow-up on 08/23/2018 for voiding trial and cystoscopy and further  management. Patient also has significant problem with constipation. Has sciatica and referred pain, he was given a short course of tramadol and was advised to use Tylenol as well as local pain relief creams.  Will treat with alfuzosin.  Patient is going home with home PT OT and outpatient follow-up.  Discharge Instructions  Discharge Instructions    Call MD for:  severe uncontrolled pain   Complete by:  As directed    Call MD for:  temperature >100.4   Complete by:  As directed    Diet - low sodium heart healthy   Complete by:  As directed    Increase activity slowly   Complete by:  As directed      Allergies as of 08/17/2018      Reactions   Amoxicillin Rash   Has patient had a PCN reaction causing immediate rash, facial/tongue/throat swelling, SOB or lightheadedness with hypotension:No Has patient had a PCN reaction causing severe rash involving mucus membranes or skin necrosis:unsure Has patient had a PCN reaction that required hospitalization:No Has patient had a PCN reaction occurring within the last 10 years:unsure If all of the above answers are "NO", then may proceed with Cephalosporin use.   Ciprofloxacin Swelling, Rash   Sulfasalazine Swelling   Sulfa Antibiotics Swelling   Doxycycline Hyclate Rash   Levofloxacin Rash      Medication List    TAKE these medications   alfuzosin 10 MG 24 hr tablet Commonly known as:  UROXATRAL Take 1 tablet (10 mg total) by mouth daily with breakfast for 30 days. Start taking on:  Aug 18, 2018   aspirin EC 81 MG tablet Take 81 mg by mouth daily.   cephALEXin 500 MG capsule  Commonly known as:  KEFLEX Take 1 capsule (500 mg total) by mouth 3 (three) times daily for 10 days.   cetirizine 10 MG tablet Commonly known as:  ZYRTEC Take 10 mg by mouth daily.   naphazoline-pheniramine 0.025-0.3 % ophthalmic solution Commonly known as:  NAPHCON-A Place 1 drop into both eyes 2 (two) times daily.   protein supplement shake  Liqd Commonly known as:  PREMIER PROTEIN Take 2 oz by mouth daily. Add to PET butter pecan ice cream for calorie/protein supplement.   traMADol 50 MG tablet Commonly known as:  Ultram Take 1 tablet (50 mg total) by mouth every 6 (six) hours as needed for up to 3 days for moderate pain.   Trulance 3 MG Tabs Generic drug:  Plecanatide Take 3 mg by mouth every morning.      Follow-up Information    Kathie Rhodes, MD Follow up on 08/23/2018.   Specialty:  Urology Why:  Keep already scheduled appointment on 08/23/2018 Contact information: [place] [address] [phone]        [facility] Follow up.   Specialty:  Home Health Services Contact information: [place] [address] [phone]          Allergies  Allergen Reactions  . Amoxicillin Rash    Has patient had a PCN reaction causing immediate rash, facial/tongue/throat swelling, SOB or lightheadedness with hypotension:No Has patient had a PCN reaction causing severe rash involving mucus membranes or skin necrosis:unsure Has patient had a PCN reaction that required hospitalization:No Has patient had a PCN reaction occurring within the last 10 years:unsure If all of the above answers are "NO", then may proceed with Cephalosporin use.   . Ciprofloxacin Swelling and Rash  . Sulfasalazine Swelling  . Sulfa Antibiotics Swelling  . Doxycycline Hyclate Rash  . Levofloxacin Rash    Consultations:  Urology   Procedures/Studies: Ct Abdomen Pelvis Wo Contrast  Result Date: 08/13/2018 CLINICAL DATA:  85 year old male with abdominal pain and distention. History of bladder cancer. EXAM: CT ABDOMEN AND PELVIS WITHOUT CONTRAST TECHNIQUE: Multidetector CT imaging of the abdomen and pelvis was performed following the standard protocol without IV contrast. COMPARISON:  CT of the abdomen pelvis dated 02/05/2015 FINDINGS: Evaluation of this exam is limited in the absence of  intravenous contrast. Lower chest: The visualized lung bases are clear. No intra-abdominal free air or free fluid. Hepatobiliary: The liver is unremarkable. There is layering stone within the gallbladder. No pericholecystic fluid. Pancreas: Unremarkable. No pancreatic ductal dilatation or surrounding inflammatory changes. Spleen: Normal in size without focal abnormality. Adrenals/Urinary Tract: The adrenal glands are unremarkable. There is severe bilateral hydronephroureter, new since the prior CT, likely related to bladder outlet obstruction versus less likely obstruction at the level of the UVJ bilaterally. The urinary bladder is moderately distended. There is diffuse mild thickened appearance of the bladder wall with surrounding haziness. Correlation with urinalysis recommended to exclude active cystitis. Stomach/Bowel: There is redundancy of the sigmoid colon. There is moderate stool throughout the colon. There is no bowel obstruction or active inflammation. Normal appendix. Vascular/Lymphatic: There is advanced aortoiliac atherosclerotic disease. There is a 3.1 cm infrarenal abdominal aortic aneurysm, unchanged since the prior CT. No portal venous gas. There is no adenopathy. Reproductive: The prostate gland is enlarged measuring 5.5 cm in transverse axial diameter. The seminal vesicles are symmetric. Other: None Musculoskeletal: Multilevel degenerative changes of the spine. Grade 1 L3-L4 and L4-L5 anterolisthesis. No acute osseous pathology. IMPRESSION:  1. Development of severe bilateral hydronephrosis, new compared to the prior CT, likely related to bladder outlet obstruction versus less likely obstruction at the level of the UVJ bilaterally. Urology consult is advised. 2. Moderate colonic stool burden. No bowel obstruction or active inflammation. Normal appendix. 3. Cholelithiasis. 4. Advanced aortoiliac atherosclerotic disease. A 3.1 cm infrarenal abdominal aortic aneurysm. Recommend followup by ultrasound  in 3 years. This recommendation follows ACR consensus guidelines: White Paper of the ACR Incidental Findings Committee II on Vascular Findings. J Am Coll Radiol 2013; 10:789-794. Electronically Signed   By: Anner Crete M.D.   On: 08/13/2018 21:12   Dg Chest Portable 1 View  Result Date: 08/13/2018 CLINICAL DATA:  Fevers EXAM: PORTABLE CHEST 1 VIEW COMPARISON:  08/03/2016 FINDINGS: Cardiac shadow is within normal limits. Mild aortic calcifications are seen. The lungs are clear bilaterally. No acute bony abnormality is noted. IMPRESSION: No active disease. Electronically Signed   By: Inez Catalina M.D.   On: 08/13/2018 19:54      Subjective: Patient seen and examined on the day of discharge.  Call placed and discussed with patient's wife.  Remains afebrile.  He does have significant back pain with sciatica and he could not sleep with that last night. Patient and family are aware about going home with Foley catheter and they are well educated to take care of it.  They will also need to support at home.   Discharge Exam: Vitals:   08/16/18 2021 08/17/18 0514  BP: 93/81 122/74  Pulse: 84 88  Resp: 18   Temp: 98 F (36.7 C) 98.4 F (36.9 C)  SpO2: 100% 99%   Vitals:   08/16/18 1120 08/16/18 1234 08/16/18 2021 08/17/18 0514  BP:  103/71 93/81 122/74  Pulse:  (!) 109 84 88  Resp:  19 18   Temp:  97.8 F (36.6 C) 98 F (36.7 C) 98.4 F (36.9 C)  TempSrc:  Oral  Oral  SpO2: 97% 97% 100% 99%  Weight:      Height:        General: Pt is alert, awake, not in acute distress Cardiovascular: RRR, S1/S2 +, no rubs, no gallops Respiratory: CTA bilaterally, no wheezing, no rhonchi Abdominal: Soft, NT, ND, bowel sounds + Extremities: no edema, no cyanosis Foley catheter with clear urine.   The results of significant diagnostics from this hospitalization (including imaging, microbiology, ancillary and laboratory) are listed below for reference.     Microbiology: Recent Results (from  the past 240 hour(s))  Culture, Urine     Status: None   Collection Time: 08/13/18  7:37 PM  Result Value Ref Range Status   Specimen Description   Final    URINE, RANDOM Performed at [facility] [address], [place], [place] [address]    Special Requests   Final    NONE Performed at [facility], [place] [address] [place], [place] [address]    Culture   Final    NO GROWTH Performed at [facility], [place] [address], [place], [place] [address]    Report Status 08/15/2018 FINAL  Final  SARS Coronavirus 2 (CEPHEID- Performed in [facility] hospital lab), Hosp Order     Status: None   Collection Time: 08/13/18  7:38 PM  Result Value Ref Range Status   SARS Coronavirus 2 NEGATIVE NEGATIVE Final    Comment: (NOTE) If result is NEGATIVE SARS-CoV-2 target nucleic acids are NOT DETECTED. The SARS-CoV-2 RNA is generally detectable  in upper and lower  respiratory specimens during the acute phase of infection. The lowest  concentration of SARS-CoV-2 viral copies this assay can detect is 250  copies / mL. A negative result does not preclude SARS-CoV-2 infection  and should not be used as the sole basis for treatment or other  patient management decisions.  A negative result may occur with  improper specimen collection / handling, submission of specimen other  than nasopharyngeal swab, presence of viral mutation(s) within the  areas targeted by this assay, and inadequate number of viral copies  (<250 copies / mL). A negative result must be combined with clinical  observations, patient history, and epidemiological information. If result is POSITIVE SARS-CoV-2 target nucleic acids are DETECTED. The SARS-CoV-2 RNA is generally detectable in upper and lower  respiratory specimens dur ing the acute phase of infection.  Positive  results are indicative of active infection with SARS-CoV-2.  Clinical  correlation with patient history and other  diagnostic information is  necessary to determine patient infection status.  Positive results do  not rule out bacterial infection or co-infection with other viruses. If result is PRESUMPTIVE POSTIVE SARS-CoV-2 nucleic acids MAY BE PRESENT.   A presumptive positive result was obtained on the submitted specimen  and confirmed on repeat testing.  While 2019 novel coronavirus  (SARS-CoV-2) nucleic acids may be present in the submitted sample  additional confirmatory testing may be necessary for epidemiological  and / or clinical management purposes  to differentiate between  SARS-CoV-2 and other Sarbecovirus currently known to infect humans.  If clinically indicated additional testing with an alternate test  methodology [id number]) is advised. The SARS-CoV-2 RNA is generally  detectable in upper and lower respiratory sp ecimens during the acute  phase of infection. The expected result is Negative. Fact Sheet for Patients:  StrictlyIdeas.no Fact Sheet for Healthcare Providers: BankingDealers.co.za This test is not yet approved or cleared by the [place] FDA and has been authorized for detection and/or diagnosis of SARS-CoV-2 by FDA under an Emergency Use Authorization (EUA).  This EUA will remain in effect (meaning this test can be used) for the duration of the COVID-19 declaration under Section 564(b)(1) of the Act, 21 U.S.C. section 360bbb-3(b)(1), unless the authorization is terminated or revoked sooner. Performed at [facility], [place] [address], [place], [place] [address]   Blood culture (routine x 2)     Status: None (Preliminary result)   Collection Time: 08/13/18  8:13 PM  Result Value Ref Range Status   Specimen Description   Final    BLOOD LEFT ANTECUBITAL Performed at [facility] [address], [place], [place] [address]    Special Requests   Final    BOTTLES DRAWN AEROBIC ONLY Blood  Culture adequate volume Performed at [facility] [address] [place], [place] [address]    Culture   Final    NO GROWTH 2 DAYS Performed at [facility] [address], [place], [place] [address]    Report Status PENDING  Incomplete  Blood culture (routine x 2)     Status: None (Preliminary result)   Collection Time: 08/13/18  8:13 PM  Result Value Ref Range Status   Specimen Description   Final    BLOOD BLOOD RIGHT FOREARM Performed at [facility] [address] [place], [place] [address]    Special Requests   Final    BOTTLES DRAWN AEROBIC AND ANAEROBIC Blood Culture adequate volume  Performed at [facility], [place] [address] [place], [place] [address]    Culture   Final    NO GROWTH 2 DAYS Performed at [facility] [address] [place], [place] [address]    Report Status PENDING  Incomplete  MRSA PCR Screening     Status: None   Collection Time: 08/13/18 11:50 PM  Result Value Ref Range Status   MRSA by PCR NEGATIVE NEGATIVE Final    Comment:        The GeneXpert MRSA Assay (FDA approved for NASAL specimens only), is one component of a comprehensive MRSA colonization surveillance program. It is not intended to diagnose MRSA infection nor to guide or monitor treatment for MRSA infections. Performed at [facility], [place] [address], [place], [place] [address]      Labs: BNP (last 3 results) No results for input(s): BNP in the last 8760 hours. Basic Metabolic Panel: Recent Labs  Lab 08/13/18 1937 08/14/18 0247 08/15/18 0227 08/16/18 0431 08/17/18 0558  NA 137 140 140 140 137  K 4.8 4.3 3.7 3.3* 3.5  CL 106 113* 113* 110 107  CO2 21* 19* 19* 22 23  GLUCOSE 115* 110* 95 91 92  BUN 57* 47* 37* 28* 25*  CREATININE 2.29* 1.89* 1.56* 1.26* 1.11  CALCIUM 8.8* 7.6* 8.1* 8.4* 8.8*   Liver Function Tests: Recent Labs  Lab 08/13/18 1937  AST 23  ALT 16  ALKPHOS  88  BILITOT 0.7  PROT 6.9  ALBUMIN 3.6   No results for input(s): LIPASE, AMYLASE in the last 168 hours. No results for input(s): AMMONIA in the last 168 hours. CBC: Recent Labs  Lab 08/13/18 1937 08/14/18 0247 08/15/18 0227 08/16/18 0431 08/17/18 0558  WBC 17.2* 9.6 7.9 6.2 7.8  NEUTROABS 16.2* 8.3* 6.0 4.3 5.3  HGB 10.7* 9.0* 8.9* 9.8* 9.6*  HCT 33.8* 29.1* 28.1* 29.9* 30.4*  MCV 96.3 98.3 97.9 94.6 95.0  PLT 238 165 162 185 208   Cardiac Enzymes: No results for input(s): CKTOTAL, CKMB, CKMBINDEX, TROPONINI in the last 168 hours. BNP: Invalid input(s): POCBNP CBG: No results for input(s): GLUCAP in the last 168 hours. D-Dimer No results for input(s): DDIMER in the last 72 hours. Hgb A1c No results for input(s): HGBA1C in the last 72 hours. Lipid Profile No results for input(s): CHOL, HDL, LDLCALC, TRIG, CHOLHDL, LDLDIRECT in the last 72 hours. Thyroid function studies No results for input(s): TSH, T4TOTAL, T3FREE, THYROIDAB in the last 72 hours.  Invalid input(s): FREET3 Anemia work up No results for input(s): VITAMINB12, FOLATE, FERRITIN, TIBC, IRON, RETICCTPCT in the last 72 hours. Urinalysis    Component Value Date/Time   COLORURINE YELLOW 08/13/2018 1937   APPEARANCEUR CLOUDY (A) 08/13/2018 1937   LABSPEC 1.011 08/13/2018 1937   PHURINE 7.0 08/13/2018 1937   GLUCOSEU NEGATIVE 08/13/2018 1937   HGBUR SMALL (A) 08/13/2018 1937   BILIRUBINUR NEGATIVE 08/13/2018 1937   BILIRUBINUR n 06/26/2016 0810   KETONESUR NEGATIVE 08/13/2018 1937   PROTEINUR 30 (A) 08/13/2018 1937   UROBILINOGEN 0.2 06/26/2016 0810   UROBILINOGEN 0.2 07/11/2012 0200   NITRITE NEGATIVE 08/13/2018 1937   LEUKOCYTESUR LARGE (A) 08/13/2018 1937   Sepsis Labs Invalid input(s): PROCALCITONIN,  WBC,  LACTICIDVEN Microbiology Recent Results (from the past 240 hour(s))  Culture, Urine     Status: None   Collection Time: 08/13/18  7:37 PM  Result Value Ref Range Status   Specimen  Description   Final  URINE, RANDOM Performed at [facility], [place] [address] [place], [place] [address]    Special Requests   Final    NONE Performed at [facility], [place] [address], [place], [place] [address]    Culture   Final    NO GROWTH Performed at [facility], [place] [address], [place], [place] [address]    Report Status 08/15/2018 FINAL  Final  SARS Coronavirus 2 (CEPHEID- Performed in [facility] hospital lab), Hosp Order     Status: None   Collection Time: 08/13/18  7:38 PM  Result Value Ref Range Status   SARS Coronavirus 2 NEGATIVE NEGATIVE Final    Comment: (NOTE) If result is NEGATIVE SARS-CoV-2 target nucleic acids are NOT DETECTED. The SARS-CoV-2 RNA is generally detectable in upper and lower  respiratory specimens during the acute phase of infection. The lowest  concentration of SARS-CoV-2 viral copies this assay can detect is 250  copies / mL. A negative result does not preclude SARS-CoV-2 infection  and should not be used as the sole basis for treatment or other  patient management decisions.  A negative result may occur with  improper specimen collection / handling, submission of specimen other  than nasopharyngeal swab, presence of viral mutation(s) within the  areas targeted by this assay, and inadequate number of viral copies  (<250 copies / mL). A negative result must be combined with clinical  observations, patient history, and epidemiological information. If result is POSITIVE SARS-CoV-2 target nucleic acids are DETECTED. The SARS-CoV-2 RNA is generally detectable in upper and lower  respiratory specimens dur ing the acute phase of infection.  Positive  results are indicative of active infection with SARS-CoV-2.  Clinical  correlation with patient history and other diagnostic information is  necessary to determine patient infection status.  Positive results do  not rule out bacterial infection or  co-infection with other viruses. If result is PRESUMPTIVE POSTIVE SARS-CoV-2 nucleic acids MAY BE PRESENT.   A presumptive positive result was obtained on the submitted specimen  and confirmed on repeat testing.  While 2019 novel coronavirus  (SARS-CoV-2) nucleic acids may be present in the submitted sample  additional confirmatory testing may be necessary for epidemiological  and / or clinical management purposes  to differentiate between  SARS-CoV-2 and other Sarbecovirus currently known to infect humans.  If clinically indicated additional testing with an alternate test  methodology [id number]) is advised. The SARS-CoV-2 RNA is generally  detectable in upper and lower respiratory sp ecimens during the acute  phase of infection. The expected result is Negative. Fact Sheet for Patients:  StrictlyIdeas.no Fact Sheet for Healthcare Providers: BankingDealers.co.za This test is not yet approved or cleared by the [place] FDA and has been authorized for detection and/or diagnosis of SARS-CoV-2 by FDA under an Emergency Use Authorization (EUA).  This EUA will remain in effect (meaning this test can be used) for the duration of the COVID-19 declaration under Section 564(b)(1) of the Act, 21 U.S.C. section 360bbb-3(b)(1), unless the authorization is terminated or revoked sooner. Performed at [facility], [place] [address], [place], [place] [address]   Blood culture (routine x 2)     Status: None (Preliminary result)   Collection Time: 08/13/18  8:13 PM  Result Value Ref Range Status   Specimen Description   Final    BLOOD LEFT ANTECUBITAL Performed at [facility] [address], [place],  [address]    Special Requests  Final    BOTTLES DRAWN AEROBIC ONLY Blood Culture adequate volume Performed at [facility] [address], [place], [place] [address]    Culture   Final     NO GROWTH 2 DAYS Performed at [facility] [address], [place], [place] [address]    Report Status PENDING  Incomplete  Blood culture (routine x 2)     Status: None (Preliminary result)   Collection Time: 08/13/18  8:13 PM  Result Value Ref Range Status   Specimen Description   Final    BLOOD BLOOD RIGHT FOREARM Performed at [facility] [address], [place], [place] [address]    Special Requests   Final    BOTTLES DRAWN AEROBIC AND ANAEROBIC Blood Culture adequate volume Performed at [facility] [address] [place], [place] [address]    Culture   Final    NO GROWTH 2 DAYS Performed at [facility] [address], [place], [place] [address]    Report Status PENDING  Incomplete  MRSA PCR Screening     Status: None   Collection Time: 08/13/18 11:50 PM  Result Value Ref Range Status   MRSA by PCR NEGATIVE NEGATIVE Final    Comment:        The GeneXpert MRSA Assay (FDA approved for NASAL specimens only), is one component of a comprehensive MRSA colonization surveillance program. It is not intended to diagnose MRSA infection nor to guide or monitor treatment for MRSA infections. Performed at [facility], [place] [address] [place], [place] [address]      Time coordinating discharge: 35 minutes  SIGNED:   Barb Merino, MD  [facility] 08/17/2018, 12:50 PM Pager [phone]  If 7PM-7AM, please contact night-coverage [email or web address] Password [email or web address]

## 2018-08-17 NOTE — Progress Notes (Signed)
AVS reviewed with patient's spouse/caregiver, Pamala Hurry, over the phone and printed instructions also sent - verbalized understanding of discharge instructions, physician follow-up, medications, foley catheter care, and [facility] (RN/PT) arranged.  She reports that patient has had a foley catheter care before and that they used [facility] to come out twice a day to also provide care of catheter and monitoring.  Reports [facility] will be coming out this time also to assist with foley care and monitoring twice a day.  Patient's IV removed.  Site WNL.  Patient transported by NT via w/c to main entrance at discharge.  Foley intact with drainage bag.  Patient stable at time of discharge.

## 2018-08-17 NOTE — TOC Progression Note (Signed)
Transition of Care [facility]) - Progression Note    Patient Details  Name: Benjamin Cardenas MRN: [id number] Date of Birth: 1929/06/18  [facility] CM/SW Contact  Tishara Pizano, Marjie Skiff, RN Phone Number: 08/17/2018, 12:46 PM  Clinical Narrative:     Pt to dc with [facility] home health services. [facility] rep made aware of DC and orders.     Marney Doctor RN,BSN [phone]

## 2018-08-18 ENCOUNTER — Telehealth: Payer: Self-pay | Admitting: *Deleted

## 2018-08-18 NOTE — Telephone Encounter (Signed)
Transition Care Management Follow-up Telephone Call  PCP: Isaac Bliss, Rayford Halsted, MD  Admit date: 08/13/2018 Discharge date: 08/17/2018  Admitted From: Home Disposition: Home with home health care  Recommendations for Outpatient Follow-up:  1. Follow up with PCP in 1-2 weeks 2. Follow-up with urologist as scheduled.  Home Health: PT/OT/RN Equipment/Devices: None  Discharge Condition: Stable CODE STATUS: Full code Diet recommendation: Regular diet.   How have you been since you were released from the hospital? Better with some back pain   Do you understand why you were in the hospital? yes   Do you understand the discharge instructions? yes   Where were you discharged to? home   Items Reviewed:  Medications reviewed: yes  Allergies reviewed: yes  Dietary changes reviewed: yes  Referrals reviewed: yes - going to follow up with urology 08/23/2018   Functional Questionnaire:   Activities of Daily Living (ADLs):   He states they are independent in the following: with wife's help States they require assistance with the following: with wife's help   Any transportation issues/concerns?: no   Any patient concerns? no   Confirmed importance and date/time of follow-up visits scheduled yes  Provider Appointment booked with Dr Jerilee Hoh 08/24/18.  Wife scheduled appointment after urology appointment.  Confirmed with patient if condition begins to worsen call PCP or go to the ER.  Patient was given the office number and encouraged to call back with question or concerns.  : yes

## 2018-08-19 DIAGNOSIS — K8010 Calculus of gallbladder with chronic cholecystitis without obstruction: Secondary | ICD-10-CM | POA: Diagnosis not present

## 2018-08-19 DIAGNOSIS — K4090 Unilateral inguinal hernia, without obstruction or gangrene, not specified as recurrent: Secondary | ICD-10-CM | POA: Diagnosis not present

## 2018-08-19 DIAGNOSIS — Z87891 Personal history of nicotine dependence: Secondary | ICD-10-CM | POA: Diagnosis not present

## 2018-08-19 DIAGNOSIS — I714 Abdominal aortic aneurysm, without rupture: Secondary | ICD-10-CM | POA: Diagnosis not present

## 2018-08-19 DIAGNOSIS — K5900 Constipation, unspecified: Secondary | ICD-10-CM | POA: Diagnosis not present

## 2018-08-19 DIAGNOSIS — Z9849 Cataract extraction status, unspecified eye: Secondary | ICD-10-CM | POA: Diagnosis not present

## 2018-08-19 DIAGNOSIS — R4182 Altered mental status, unspecified: Secondary | ICD-10-CM | POA: Diagnosis not present

## 2018-08-19 DIAGNOSIS — D649 Anemia, unspecified: Secondary | ICD-10-CM | POA: Diagnosis not present

## 2018-08-19 DIAGNOSIS — E871 Hypo-osmolality and hyponatremia: Secondary | ICD-10-CM | POA: Diagnosis not present

## 2018-08-19 DIAGNOSIS — N390 Urinary tract infection, site not specified: Secondary | ICD-10-CM | POA: Diagnosis not present

## 2018-08-19 DIAGNOSIS — Z7982 Long term (current) use of aspirin: Secondary | ICD-10-CM | POA: Diagnosis not present

## 2018-08-19 DIAGNOSIS — G9340 Encephalopathy, unspecified: Secondary | ICD-10-CM | POA: Diagnosis not present

## 2018-08-19 DIAGNOSIS — N179 Acute kidney failure, unspecified: Secondary | ICD-10-CM | POA: Diagnosis not present

## 2018-08-19 DIAGNOSIS — I700 Atherosclerosis of aorta: Secondary | ICD-10-CM | POA: Diagnosis not present

## 2018-08-19 DIAGNOSIS — M1990 Unspecified osteoarthritis, unspecified site: Secondary | ICD-10-CM | POA: Diagnosis not present

## 2018-08-19 DIAGNOSIS — N1330 Unspecified hydronephrosis: Secondary | ICD-10-CM | POA: Diagnosis not present

## 2018-08-19 DIAGNOSIS — J302 Other seasonal allergic rhinitis: Secondary | ICD-10-CM | POA: Diagnosis not present

## 2018-08-19 DIAGNOSIS — N138 Other obstructive and reflux uropathy: Secondary | ICD-10-CM | POA: Diagnosis not present

## 2018-08-19 DIAGNOSIS — L858 Other specified epidermal thickening: Secondary | ICD-10-CM | POA: Diagnosis not present

## 2018-08-19 DIAGNOSIS — Z466 Encounter for fitting and adjustment of urinary device: Secondary | ICD-10-CM | POA: Diagnosis not present

## 2018-08-19 DIAGNOSIS — L432 Lichenoid drug reaction: Secondary | ICD-10-CM | POA: Diagnosis not present

## 2018-08-19 DIAGNOSIS — N401 Enlarged prostate with lower urinary tract symptoms: Secondary | ICD-10-CM | POA: Diagnosis not present

## 2018-08-19 DIAGNOSIS — C679 Malignant neoplasm of bladder, unspecified: Secondary | ICD-10-CM | POA: Diagnosis not present

## 2018-08-19 DIAGNOSIS — A419 Sepsis, unspecified organism: Secondary | ICD-10-CM | POA: Diagnosis not present

## 2018-08-19 DIAGNOSIS — L309 Dermatitis, unspecified: Secondary | ICD-10-CM | POA: Diagnosis not present

## 2018-08-19 LAB — CULTURE, BLOOD (ROUTINE X 2)
Culture: NO GROWTH
Culture: NO GROWTH
Special Requests: ADEQUATE
Special Requests: ADEQUATE

## 2018-08-20 DIAGNOSIS — N401 Enlarged prostate with lower urinary tract symptoms: Secondary | ICD-10-CM | POA: Diagnosis not present

## 2018-08-20 DIAGNOSIS — C679 Malignant neoplasm of bladder, unspecified: Secondary | ICD-10-CM | POA: Diagnosis not present

## 2018-08-20 DIAGNOSIS — A419 Sepsis, unspecified organism: Secondary | ICD-10-CM | POA: Diagnosis not present

## 2018-08-20 DIAGNOSIS — N179 Acute kidney failure, unspecified: Secondary | ICD-10-CM | POA: Diagnosis not present

## 2018-08-20 DIAGNOSIS — N390 Urinary tract infection, site not specified: Secondary | ICD-10-CM | POA: Diagnosis not present

## 2018-08-20 DIAGNOSIS — G9340 Encephalopathy, unspecified: Secondary | ICD-10-CM | POA: Diagnosis not present

## 2018-08-23 DIAGNOSIS — N179 Acute kidney failure, unspecified: Secondary | ICD-10-CM | POA: Diagnosis not present

## 2018-08-23 DIAGNOSIS — C679 Malignant neoplasm of bladder, unspecified: Secondary | ICD-10-CM | POA: Diagnosis not present

## 2018-08-23 DIAGNOSIS — N401 Enlarged prostate with lower urinary tract symptoms: Secondary | ICD-10-CM | POA: Diagnosis not present

## 2018-08-23 DIAGNOSIS — R338 Other retention of urine: Secondary | ICD-10-CM | POA: Diagnosis not present

## 2018-08-23 DIAGNOSIS — G9340 Encephalopathy, unspecified: Secondary | ICD-10-CM | POA: Diagnosis not present

## 2018-08-23 DIAGNOSIS — Z8551 Personal history of malignant neoplasm of bladder: Secondary | ICD-10-CM | POA: Diagnosis not present

## 2018-08-23 DIAGNOSIS — N390 Urinary tract infection, site not specified: Secondary | ICD-10-CM | POA: Diagnosis not present

## 2018-08-23 DIAGNOSIS — A419 Sepsis, unspecified organism: Secondary | ICD-10-CM | POA: Diagnosis not present

## 2018-08-24 ENCOUNTER — Ambulatory Visit (INDEPENDENT_AMBULATORY_CARE_PROVIDER_SITE_OTHER): Payer: Medicare Other | Admitting: Internal Medicine

## 2018-08-24 ENCOUNTER — Other Ambulatory Visit: Payer: Self-pay

## 2018-08-24 ENCOUNTER — Telehealth: Payer: Self-pay | Admitting: *Deleted

## 2018-08-24 DIAGNOSIS — M4310 Spondylolisthesis, site unspecified: Secondary | ICD-10-CM

## 2018-08-24 DIAGNOSIS — A419 Sepsis, unspecified organism: Secondary | ICD-10-CM

## 2018-08-24 DIAGNOSIS — R338 Other retention of urine: Secondary | ICD-10-CM

## 2018-08-24 DIAGNOSIS — N179 Acute kidney failure, unspecified: Secondary | ICD-10-CM | POA: Diagnosis not present

## 2018-08-24 DIAGNOSIS — N401 Enlarged prostate with lower urinary tract symptoms: Secondary | ICD-10-CM

## 2018-08-24 DIAGNOSIS — N390 Urinary tract infection, site not specified: Secondary | ICD-10-CM | POA: Diagnosis not present

## 2018-08-24 DIAGNOSIS — G9340 Encephalopathy, unspecified: Secondary | ICD-10-CM | POA: Diagnosis not present

## 2018-08-24 DIAGNOSIS — C679 Malignant neoplasm of bladder, unspecified: Secondary | ICD-10-CM | POA: Diagnosis not present

## 2018-08-24 NOTE — Progress Notes (Signed)
Virtual Visit via Video Note  I connected with Benjamin Cardenas on 08/24/18 at  1:30 PM EDT by a video enabled telemedicine application and verified that I am speaking with the correct person using two identifiers.  Location patient: home Location provider: work office Persons participating in the virtual visit: patient, provider, wife who assists with history and technology  I discussed the limitations of evaluation and management by telemedicine and the availability of in person appointments. The patient expressed understanding and agreed to proceed.   HPI: Patient is coming in today for the above mentioned reasons, specifically transitional care services face-to-face visit.    Dates hospitalized: 5/9-5/13/20 Days since discharge from hospital: 7 Patient was discharged from the hospital to: home Reason for admission to hospital: Sepsis secondary to UTI Date of interactive phone contact with patient and/or caregiver: 08/18/18  I have reviewed in detail patient's discharge summary plus pertinent specific notes, labs, and images from the hospitalization.    Thruout the day on 5/9 wife noted patient to be progressively more sleepy, he started having shaking chills. They finally went to  the ED where his Temp was 100.7 and he was found to have urinary retention. COVID-19 test was negative. They placed a foley, started rocephin. Was later transitioned to keflex with instructions to follow up with GU on 5/19 for a voiding trial. He saw GU yesterday and failed his voiding trial so his catheter was replaced. He has another follow up scheduled for later this month.  In the hospital all cx were negative. He has a few days of keflex remaining. Wife states he has done well since DC.  On CT scan there was antherolisthesis at the L3-4 and L4-5 levels. Wife wonders whether he needs an MRI as he has been having, back, hip and groin pain.  Medication reconciliation was done today and patient is  taking meds as recommended by discharging hospitalist/specialist. yes   ROS: Constitutional: Denies fever, chills, diaphoresis, appetite change and fatigue.  HEENT: Denies photophobia, eye pain, redness, hearing loss, ear pain, congestion, sore throat, rhinorrhea, sneezing, mouth sores, trouble swallowing, neck pain, neck stiffness and tinnitus.   Respiratory: Denies SOB, DOE, cough, chest tightness,  and wheezing.   Cardiovascular: Denies chest pain, palpitations and leg swelling.  Gastrointestinal: Denies nausea, vomiting, abdominal pain, diarrhea, constipation, blood in stool and abdominal distention.  Genitourinary: Denies dysuria, urgency, frequency, hematuria, flank pain and difficulty urinating.  Endocrine: Denies: hot or cold intolerance, sweats, changes in hair or nails, polyuria, polydipsia. Musculoskeletal: Denies myalgias, back pain, joint swelling, arthralgias and gait problem.  Skin: Denies pallor, rash and wound.  Neurological: Denies dizziness, seizures, syncope, weakness, light-headedness, numbness and headaches.  Hematological: Denies adenopathy. Easy bruising, personal or family bleeding history  Psychiatric/Behavioral: Denies suicidal ideation, mood changes, confusion, nervousness, sleep disturbance and agitation   Past Medical History:  Diagnosis Date  . Bladder cancer [facility]) urology-- dr Karsten Ro   dx 11/ 2016  Transitional cell carcinoma, High grade invasive , Stage T1  G3 ---  s/p  TURBT and Mitomycin C instillation  . BPH with urinary obstruction   . Dry skin dermatitis   . Elevated PSA   . History of colon polyps   . Hydronephrosis, left   . Lower urinary tract symptoms (LUTS)   . OA (osteoarthritis)   . Prostate nodule   . Seasonal allergic rhinitis   . Wears glasses     Past Surgical History:  Procedure Laterality Date  . cataract surgery    .  CYSTOSCOPY WITH BIOPSY N/A 04/22/2015   Procedure: CYSTOSCOPY WITH BLADDER BIOPSY;  Surgeon: Kathie Rhodes, MD;   Location: [facility];  Service: Urology;  Laterality: N/A;  . CYSTOSCOPY WITH RETROGRADE PYELOGRAM, URETEROSCOPY AND STENT PLACEMENT Left 02/25/2015   Procedure: LEFT RETROGRADE PYELOGRAM, URETEROSCOPY ;  Surgeon: Kathie Rhodes, MD;  Location: [facility];  Service: Urology;  Laterality: Left;  . HERNIA REPAIR  age 22 1937   rupture right inguinal hernia  . INGUINAL HERNIA REPAIR  03/02/2011   Procedure: HERNIA REPAIR INGUINAL ADULT;  Surgeon: Earnstine Regal, MD;  Location: [facility];  Service: General;  Laterality: Left;  with mesh   . TRANSURETHRAL RESECTION OF BLADDER TUMOR WITH GYRUS (TURBT-GYRUS) N/A 02/25/2015   Procedure: TRANSURETHRAL RESECTION OF BLADDER TUMOR WITH GYRUS (TURBT-GYRUS);  Surgeon: Kathie Rhodes, MD;  [facility];  Service: Urology;  Laterality: N/A;    Family History  Problem Relation Age of Onset  . Heart disease Mother   . Heart disease Father   . Cancer Sister        breast  . Cancer Brother        colon    SOCIAL HX:   reports that he quit smoking about 39 years ago. His smoking use included cigarettes. He has a 51.00 pack-year smoking history. He has never used smokeless tobacco. He reports current alcohol use. He reports that he does not use drugs.   Current Outpatient Medications:  .  alfuzosin (UROXATRAL) 10 MG 24 hr tablet, Take 1 tablet (10 mg total) by mouth daily with breakfast for 30 days., Disp: 30 tablet, Rfl: 0 .  aspirin EC 81 MG tablet, Take 81 mg by mouth daily. , Disp: , Rfl:  .  cephALEXin (KEFLEX) 500 MG capsule, Take 1 capsule (500 mg total) by mouth 3 (three) times daily for 10 days., Disp: 40 capsule, Rfl: 0 .  cetirizine (ZYRTEC) 10 MG tablet, Take 10 mg by mouth daily. , Disp: , Rfl:  .  finasteride (PROSCAR) 5 MG tablet, Take 5 mg by mouth daily. , Disp: , Rfl:  .  naphazoline-pheniramine (NAPHCON-A) 0.025-0.3 % ophthalmic solution, Place 1 drop into both eyes 2 (two) times daily.,  Disp: , Rfl:  .  protein supplement shake (PREMIER PROTEIN) LIQD, Take 2 oz by mouth daily. Add to PET butter pecan ice cream for calorie/protein supplement., Disp: , Rfl:  .  TRULANCE 3 MG TABS, Take 3 mg by mouth every morning. , Disp: , Rfl:   EXAM:   VITALS per patient if applicable: none reported  GENERAL: alert, oriented, appears well and in no acute distress  HEENT: atraumatic, conjunttiva clear, no obvious abnormalities on inspection of external nose and ears  NECK: normal movements of the head and neck  LUNGS: on inspection no signs of respiratory distress, breathing rate appears normal, no obvious gross increased work of breathing, gasping or wheezing  CV: no obvious cyanosis  MS: moves all visible extremities without noticeable abnormality  PSYCH/NEURO: pleasant and cooperative, no obvious depression or anxiety, speech and thought processing grossly intact  ASSESSMENT AND PLAN:   Sepsis due to urinary tract infection ([place]) Urinary retention due to benign prostatic hyperplasia -Complete course of keflex as prescribed. -Follow up with GU as scheduled for repeat voiding trial. -He is on finasteride and alfuzosin  Anterolisthesis at multiple lumbar spine levels -Given wife's concerns, will send out a referral to neurosurgery. -I will not order the MRI up front as  I do not think he would be a good surgical candidate. If NS believes there are treatment options available, will be happy to request.    Medical decision making of moderate complexity was utilized today.      I discussed the assessment and treatment plan with the patient. The patient was provided an opportunity to ask questions and all were answered. The patient agreed with the plan and demonstrated an understanding of the instructions.   The patient was advised to call back or seek an in-person evaluation if the symptoms worsen or if the condition fails to improve as anticipated.    Lelon Frohlich, MD  [facility]

## 2018-08-24 NOTE — Telephone Encounter (Signed)
Wife would like to know if patient should have a lumbar MRI because of the results received from the CT. He also has continued pain with the Right groin.

## 2018-08-24 NOTE — Telephone Encounter (Signed)
Please let them know I will send neurosurgical referral. Not sure that an MRI would be indicated as I am not sure that he would be a good surgical candidate.

## 2018-08-25 ENCOUNTER — Encounter: Payer: Medicare Other | Admitting: Physical Therapy

## 2018-08-26 DIAGNOSIS — G9340 Encephalopathy, unspecified: Secondary | ICD-10-CM | POA: Diagnosis not present

## 2018-08-26 DIAGNOSIS — N401 Enlarged prostate with lower urinary tract symptoms: Secondary | ICD-10-CM | POA: Diagnosis not present

## 2018-08-26 DIAGNOSIS — N179 Acute kidney failure, unspecified: Secondary | ICD-10-CM | POA: Diagnosis not present

## 2018-08-26 DIAGNOSIS — N390 Urinary tract infection, site not specified: Secondary | ICD-10-CM | POA: Diagnosis not present

## 2018-08-26 DIAGNOSIS — C679 Malignant neoplasm of bladder, unspecified: Secondary | ICD-10-CM | POA: Diagnosis not present

## 2018-08-26 DIAGNOSIS — A419 Sepsis, unspecified organism: Secondary | ICD-10-CM | POA: Diagnosis not present

## 2018-08-26 NOTE — Telephone Encounter (Signed)
Wife is aware

## 2018-08-30 ENCOUNTER — Encounter: Payer: Medicare Other | Admitting: Physical Therapy

## 2018-08-31 DIAGNOSIS — N390 Urinary tract infection, site not specified: Secondary | ICD-10-CM | POA: Diagnosis not present

## 2018-08-31 DIAGNOSIS — N401 Enlarged prostate with lower urinary tract symptoms: Secondary | ICD-10-CM | POA: Diagnosis not present

## 2018-08-31 DIAGNOSIS — C679 Malignant neoplasm of bladder, unspecified: Secondary | ICD-10-CM | POA: Diagnosis not present

## 2018-08-31 DIAGNOSIS — N179 Acute kidney failure, unspecified: Secondary | ICD-10-CM | POA: Diagnosis not present

## 2018-08-31 DIAGNOSIS — A419 Sepsis, unspecified organism: Secondary | ICD-10-CM | POA: Diagnosis not present

## 2018-08-31 DIAGNOSIS — G9340 Encephalopathy, unspecified: Secondary | ICD-10-CM | POA: Diagnosis not present

## 2018-09-01 ENCOUNTER — Encounter: Payer: Medicare Other | Admitting: Physical Therapy

## 2018-09-05 DIAGNOSIS — N179 Acute kidney failure, unspecified: Secondary | ICD-10-CM | POA: Diagnosis not present

## 2018-09-05 DIAGNOSIS — N390 Urinary tract infection, site not specified: Secondary | ICD-10-CM | POA: Diagnosis not present

## 2018-09-05 DIAGNOSIS — C679 Malignant neoplasm of bladder, unspecified: Secondary | ICD-10-CM | POA: Diagnosis not present

## 2018-09-05 DIAGNOSIS — A419 Sepsis, unspecified organism: Secondary | ICD-10-CM | POA: Diagnosis not present

## 2018-09-05 DIAGNOSIS — N401 Enlarged prostate with lower urinary tract symptoms: Secondary | ICD-10-CM | POA: Diagnosis not present

## 2018-09-05 DIAGNOSIS — G9340 Encephalopathy, unspecified: Secondary | ICD-10-CM | POA: Diagnosis not present

## 2018-09-07 DIAGNOSIS — M5441 Lumbago with sciatica, right side: Secondary | ICD-10-CM | POA: Diagnosis not present

## 2018-09-07 DIAGNOSIS — G8929 Other chronic pain: Secondary | ICD-10-CM | POA: Diagnosis not present

## 2018-09-09 DIAGNOSIS — M5441 Lumbago with sciatica, right side: Secondary | ICD-10-CM | POA: Diagnosis not present

## 2018-09-13 ENCOUNTER — Telehealth: Payer: Self-pay | Admitting: Internal Medicine

## 2018-09-13 DIAGNOSIS — N390 Urinary tract infection, site not specified: Secondary | ICD-10-CM | POA: Diagnosis not present

## 2018-09-13 DIAGNOSIS — A419 Sepsis, unspecified organism: Secondary | ICD-10-CM | POA: Diagnosis not present

## 2018-09-13 DIAGNOSIS — N179 Acute kidney failure, unspecified: Secondary | ICD-10-CM | POA: Diagnosis not present

## 2018-09-13 DIAGNOSIS — C679 Malignant neoplasm of bladder, unspecified: Secondary | ICD-10-CM | POA: Diagnosis not present

## 2018-09-13 DIAGNOSIS — M545 Low back pain, unspecified: Secondary | ICD-10-CM

## 2018-09-13 DIAGNOSIS — N401 Enlarged prostate with lower urinary tract symptoms: Secondary | ICD-10-CM | POA: Diagnosis not present

## 2018-09-13 DIAGNOSIS — Z9181 History of falling: Secondary | ICD-10-CM

## 2018-09-13 DIAGNOSIS — G9340 Encephalopathy, unspecified: Secondary | ICD-10-CM | POA: Diagnosis not present

## 2018-09-13 NOTE — Telephone Encounter (Signed)
Copied from [place] [id number]. Topic: General - Other >> Sep 13, 2018  4:15 PM Celene Kras A wrote: Reason for CRM: Dwyane Dee, from [place], called requesting a script be sent to [facility] for pts walker. Please advise.

## 2018-09-14 DIAGNOSIS — M48061 Spinal stenosis, lumbar region without neurogenic claudication: Secondary | ICD-10-CM | POA: Diagnosis not present

## 2018-09-14 DIAGNOSIS — M5116 Intervertebral disc disorders with radiculopathy, lumbar region: Secondary | ICD-10-CM | POA: Diagnosis not present

## 2018-09-14 DIAGNOSIS — M5441 Lumbago with sciatica, right side: Secondary | ICD-10-CM | POA: Diagnosis not present

## 2018-09-14 DIAGNOSIS — M4316 Spondylolisthesis, lumbar region: Secondary | ICD-10-CM | POA: Diagnosis not present

## 2018-09-14 NOTE — Telephone Encounter (Signed)
I am ok with this. Do I need to physically sign something?

## 2018-09-15 NOTE — Telephone Encounter (Signed)
Order placed and message sent to [place]

## 2018-09-18 DIAGNOSIS — D649 Anemia, unspecified: Secondary | ICD-10-CM | POA: Diagnosis not present

## 2018-09-18 DIAGNOSIS — N179 Acute kidney failure, unspecified: Secondary | ICD-10-CM | POA: Diagnosis not present

## 2018-09-18 DIAGNOSIS — N138 Other obstructive and reflux uropathy: Secondary | ICD-10-CM | POA: Diagnosis not present

## 2018-09-18 DIAGNOSIS — M1990 Unspecified osteoarthritis, unspecified site: Secondary | ICD-10-CM | POA: Diagnosis not present

## 2018-09-18 DIAGNOSIS — L432 Lichenoid drug reaction: Secondary | ICD-10-CM | POA: Diagnosis not present

## 2018-09-18 DIAGNOSIS — E871 Hypo-osmolality and hyponatremia: Secondary | ICD-10-CM | POA: Diagnosis not present

## 2018-09-18 DIAGNOSIS — L309 Dermatitis, unspecified: Secondary | ICD-10-CM | POA: Diagnosis not present

## 2018-09-18 DIAGNOSIS — I700 Atherosclerosis of aorta: Secondary | ICD-10-CM | POA: Diagnosis not present

## 2018-09-18 DIAGNOSIS — N390 Urinary tract infection, site not specified: Secondary | ICD-10-CM | POA: Diagnosis not present

## 2018-09-18 DIAGNOSIS — I714 Abdominal aortic aneurysm, without rupture: Secondary | ICD-10-CM | POA: Diagnosis not present

## 2018-09-18 DIAGNOSIS — N1330 Unspecified hydronephrosis: Secondary | ICD-10-CM | POA: Diagnosis not present

## 2018-09-18 DIAGNOSIS — L858 Other specified epidermal thickening: Secondary | ICD-10-CM | POA: Diagnosis not present

## 2018-09-18 DIAGNOSIS — Z466 Encounter for fitting and adjustment of urinary device: Secondary | ICD-10-CM | POA: Diagnosis not present

## 2018-09-18 DIAGNOSIS — A419 Sepsis, unspecified organism: Secondary | ICD-10-CM | POA: Diagnosis not present

## 2018-09-18 DIAGNOSIS — Z7982 Long term (current) use of aspirin: Secondary | ICD-10-CM | POA: Diagnosis not present

## 2018-09-18 DIAGNOSIS — R4182 Altered mental status, unspecified: Secondary | ICD-10-CM | POA: Diagnosis not present

## 2018-09-18 DIAGNOSIS — Z9849 Cataract extraction status, unspecified eye: Secondary | ICD-10-CM | POA: Diagnosis not present

## 2018-09-18 DIAGNOSIS — K5900 Constipation, unspecified: Secondary | ICD-10-CM | POA: Diagnosis not present

## 2018-09-18 DIAGNOSIS — K4090 Unilateral inguinal hernia, without obstruction or gangrene, not specified as recurrent: Secondary | ICD-10-CM | POA: Diagnosis not present

## 2018-09-18 DIAGNOSIS — K8010 Calculus of gallbladder with chronic cholecystitis without obstruction: Secondary | ICD-10-CM | POA: Diagnosis not present

## 2018-09-18 DIAGNOSIS — C679 Malignant neoplasm of bladder, unspecified: Secondary | ICD-10-CM | POA: Diagnosis not present

## 2018-09-18 DIAGNOSIS — G9340 Encephalopathy, unspecified: Secondary | ICD-10-CM | POA: Diagnosis not present

## 2018-09-18 DIAGNOSIS — N401 Enlarged prostate with lower urinary tract symptoms: Secondary | ICD-10-CM | POA: Diagnosis not present

## 2018-09-18 DIAGNOSIS — J302 Other seasonal allergic rhinitis: Secondary | ICD-10-CM | POA: Diagnosis not present

## 2018-09-18 DIAGNOSIS — Z87891 Personal history of nicotine dependence: Secondary | ICD-10-CM | POA: Diagnosis not present

## 2018-09-19 NOTE — Telephone Encounter (Signed)
Patient's wife Pamala Hurry calling to let Apolonio Schneiders know [place] but mainly insurance also needs medical necessity documentation that includes the length of time he will need this medical supply.

## 2018-09-20 ENCOUNTER — Inpatient Hospital Stay (HOSPITAL_COMMUNITY)
Admission: EM | Admit: 2018-09-20 | Discharge: 2018-09-23 | DRG: 872 | Disposition: A | Discharge status: Home Health | Payer: Medicare Other | Attending: Internal Medicine | Admitting: Internal Medicine

## 2018-09-20 ENCOUNTER — Encounter (HOSPITAL_COMMUNITY): Payer: Self-pay | Admitting: Emergency Medicine

## 2018-09-20 ENCOUNTER — Other Ambulatory Visit: Payer: Self-pay

## 2018-09-20 DIAGNOSIS — N138 Other obstructive and reflux uropathy: Secondary | ICD-10-CM | POA: Diagnosis present

## 2018-09-20 DIAGNOSIS — R17 Unspecified jaundice: Secondary | ICD-10-CM | POA: Diagnosis not present

## 2018-09-20 DIAGNOSIS — N401 Enlarged prostate with lower urinary tract symptoms: Secondary | ICD-10-CM | POA: Diagnosis present

## 2018-09-20 DIAGNOSIS — E871 Hypo-osmolality and hyponatremia: Secondary | ICD-10-CM | POA: Diagnosis not present

## 2018-09-20 DIAGNOSIS — D649 Anemia, unspecified: Secondary | ICD-10-CM | POA: Diagnosis present

## 2018-09-20 DIAGNOSIS — Z8551 Personal history of malignant neoplasm of bladder: Secondary | ICD-10-CM

## 2018-09-20 DIAGNOSIS — E861 Hypovolemia: Secondary | ICD-10-CM | POA: Diagnosis present

## 2018-09-20 DIAGNOSIS — N136 Pyonephrosis: Secondary | ICD-10-CM | POA: Diagnosis not present

## 2018-09-20 DIAGNOSIS — Z79899 Other long term (current) drug therapy: Secondary | ICD-10-CM

## 2018-09-20 DIAGNOSIS — R739 Hyperglycemia, unspecified: Secondary | ICD-10-CM | POA: Diagnosis present

## 2018-09-20 DIAGNOSIS — E872 Acidosis: Secondary | ICD-10-CM | POA: Diagnosis not present

## 2018-09-20 DIAGNOSIS — N130 Hydronephrosis with ureteropelvic junction obstruction: Secondary | ICD-10-CM | POA: Diagnosis not present

## 2018-09-20 DIAGNOSIS — R32 Unspecified urinary incontinence: Secondary | ICD-10-CM | POA: Diagnosis not present

## 2018-09-20 DIAGNOSIS — A419 Sepsis, unspecified organism: Principal | ICD-10-CM | POA: Diagnosis present

## 2018-09-20 DIAGNOSIS — Z20828 Contact with and (suspected) exposure to other viral communicable diseases: Secondary | ICD-10-CM | POA: Diagnosis not present

## 2018-09-20 DIAGNOSIS — R338 Other retention of urine: Secondary | ICD-10-CM | POA: Diagnosis not present

## 2018-09-20 DIAGNOSIS — Z1159 Encounter for screening for other viral diseases: Secondary | ICD-10-CM

## 2018-09-20 DIAGNOSIS — R000 Tachycardia, unspecified: Secondary | ICD-10-CM | POA: Diagnosis not present

## 2018-09-20 DIAGNOSIS — I951 Orthostatic hypotension: Secondary | ICD-10-CM | POA: Diagnosis present

## 2018-09-20 DIAGNOSIS — Z7982 Long term (current) use of aspirin: Secondary | ICD-10-CM

## 2018-09-20 DIAGNOSIS — N3001 Acute cystitis with hematuria: Secondary | ICD-10-CM | POA: Diagnosis not present

## 2018-09-20 DIAGNOSIS — N289 Disorder of kidney and ureter, unspecified: Secondary | ICD-10-CM

## 2018-09-20 DIAGNOSIS — N390 Urinary tract infection, site not specified: Secondary | ICD-10-CM | POA: Diagnosis present

## 2018-09-20 DIAGNOSIS — Z87891 Personal history of nicotine dependence: Secondary | ICD-10-CM

## 2018-09-20 MED ORDER — FENTANYL CITRATE (PF) 100 MCG/2ML IJ SOLN
50.0000 ug | Freq: Once | INTRAMUSCULAR | Status: AC
  Administered 2018-09-20: 50 ug via INTRAVENOUS
  Filled 2018-09-20: qty 2

## 2018-09-20 NOTE — Telephone Encounter (Signed)
Order faxed and confirmed.

## 2018-09-20 NOTE — ED Provider Notes (Signed)
[facility] DEPT Provider Note   CSN: [id number] Arrival date & time: 09/20/18  2205    History   Chief Complaint Chief Complaint  Patient presents with  . Abdominal Pain  . Urinary Retention    HPI Benjamin Cardenas is a 85 y.o. 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  The patient's wife reports that the patient was seen in the ER approximately 1 month ago and was found to have obstructive uropathy and had a Foley catheter placed.  He was treated for a UTI as he was also having fever and chills.  She reports that the catheter was in place until this morning when she was seen by [facility].  After they returned home, the patient had 2 episodes of urinary incontinence with yellow urine.  She reports throughout the evening the patient became increasingly agitated and complaining of lower abdominal pain.  She reports that she spoke to the nursing line urology was advised to come into the office for an appointment tomorrow, but she became concerned with the patient's elevated pain.  She denies recent fever, chills, hematuria, diarrhea, constipation, nausea, vomiting, chest pain, or shortness of breath.      The history is provided by the spouse. The history is limited by the condition of the patient. No language interpreter was used.    Past Medical History:  Diagnosis Date  . Bladder cancer [facility] [place]) urology-- dr Karsten Ro   dx 11/ 2016  Transitional cell carcinoma, High grade invasive , Stage T1  G3 ---  s/p  TURBT and Mitomycin C instillation  . BPH with urinary obstruction   . Dry skin dermatitis   . Elevated PSA   . History of colon polyps   . Hydronephrosis, left   . Lower urinary tract symptoms (LUTS)   . OA (osteoarthritis)   . Prostate nodule   . Seasonal allergic  rhinitis   . Wears glasses     Patient Active Problem List   Diagnosis Date Noted  . Mild renal insufficiency 09/21/2018  . Sepsis due to urinary tract infection ([place]) 08/14/2018  . Sepsis secondary to UTI ([place]) 08/13/2018  . AKI (acute kidney injury) ([place]) 08/13/2018  . Acute encephalopathy 08/13/2018  . Bladder outlet obstruction 08/13/2018  . AAA (abdominal aortic aneurysm) without rupture ([place]) 08/13/2018  . MRSA (methicillin resistant Staphylococcus aureus) carrier 05/30/2018  . Bladder cancer ([place]) 03/10/2018  . CAP (community acquired pneumonia) 06/26/2016  . Hyponatremia 12/13/2015  . Anemia 12/13/2015  . Altered mental status 12/13/2015  . Constipation 08/17/2015  . Memory impairment 12/04/2014  . Keratosis pilaris 02/25/2011  . 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 Inguinal hernia unilateral, non-recurrent, left 01/28/2011  . OTHER SYMPTOMS INVOLVING CARDIOVASCULAR SYSTEM 09/20/2009  . Osteoarthritis 01/31/2009  . SCIATICA, ACUTE 12/20/2007  . NECK PAIN 02/15/2007  . BPH (benign prostatic hyperplasia) 01/06/2007  . History of colonic polyps 01/06/2007    Past Surgical History:  Procedure Laterality Date  . cataract surgery    . CYSTOSCOPY WITH BIOPSY N/A 04/22/2015   Procedure: CYSTOSCOPY WITH BLADDER BIOPSY;  Surgeon: Kathie Rhodes, MD;  Location: [facility];  Service: Urology;  Laterality: N/A;  . CYSTOSCOPY WITH RETROGRADE PYELOGRAM, URETEROSCOPY AND STENT PLACEMENT Left 02/25/2015   Procedure: LEFT RETROGRADE PYELOGRAM, URETEROSCOPY ;  Surgeon: Kathie Rhodes, MD;  Location: [facility];  Service: Urology;  Laterality: Left;  . HERNIA REPAIR  age 25 1937   rupture right inguinal hernia  . INGUINAL HERNIA REPAIR  03/02/2011   Procedure: HERNIA REPAIR INGUINAL ADULT;  Surgeon: Earnstine Regal, MD;  Location: [facility];  Service: General;  Laterality: Left;  with mesh   . TRANSURETHRAL RESECTION OF BLADDER TUMOR WITH GYRUS (TURBT-GYRUS) N/A  02/25/2015   Procedure: TRANSURETHRAL RESECTION OF BLADDER TUMOR WITH GYRUS (TURBT-GYRUS);  Surgeon: Kathie Rhodes, MD;  Location: [facility];  Service: Urology;  Laterality: N/A;        Home Medications    Prior to Admission medications   Medication Sig Start Date End Date Taking? Authorizing Provider  alfuzosin (UROXATRAL) 10 MG 24 hr tablet Take 10 mg by mouth daily with breakfast.   Yes [provider]  aspirin EC 81 MG tablet Take 81 mg by mouth daily.    Yes [provider]  cetirizine (ZYRTEC) 10 MG tablet Take 10 mg by mouth daily.    Yes [provider]  finasteride (PROSCAR) 5 MG tablet Take 5 mg by mouth daily.  08/23/18  Yes [provider]  naphazoline-pheniramine (NAPHCON-A) 0.025-0.3 % ophthalmic solution Place 1 drop into both eyes 2 (two) times daily.   Yes [provider]  protein supplement shake (PREMIER PROTEIN) LIQD Take 2 oz by mouth daily as needed (food supplement). Add to PET butter pecan ice cream for calorie/protein supplement.   Yes [provider]  TRULANCE 3 MG TABS Take 3 mg by mouth every morning.  10/29/15  Yes [provider]    Family History Family History  Problem Relation Age of Onset  . Heart disease Mother   . Heart disease Father   . Cancer Sister        breast  . Cancer Brother        colon    Social History Social History   Tobacco Use  . Smoking status: Former Smoker    Packs/day: 1.50    Years: 34.00    Pack years: 51.00    Types: Cigarettes    Quit date: 02/17/1979    Years since quitting: 39.6  . Smokeless tobacco: Never Used  Substance Use Topics  . Alcohol use: Yes    Comment: occasional  . Drug use: No     Allergies   Amoxicillin, Ciprofloxacin, Sulfasalazine, Sulfa antibiotics, Doxycycline hyclate, and Levofloxacin   Review of Systems Review of Systems  Constitutional: Negative for appetite change and fever.  Respiratory: Negative for  shortness of breath.   Cardiovascular: Negative for chest pain.  Gastrointestinal: Positive for abdominal pain. Negative for blood in stool, constipation, diarrhea, nausea and vomiting.  Genitourinary: Negative for dysuria, flank pain, frequency, hematuria, penile pain, penile swelling and urgency.  Musculoskeletal: Negative for back pain and myalgias.  Skin: Negative for rash.  Allergic/Immunologic: Negative for immunocompromised state.  Neurological: Negative for dizziness, weakness and headaches.  Psychiatric/Behavioral: Positive for agitation. Negative for confusion.   Physical Exam Updated Vital Signs BP (!) 92/55 (BP Location: Left Arm)   Pulse 84   Temp 98.2 F (36.8 C) (Oral)   Resp 16   SpO2 99%   Physical Exam Vitals signs and nursing note reviewed. Exam conducted with a chaperone present.  Constitutional:      Appearance: He is well-developed.  HENT:     Head: Normocephalic.  Eyes:     General: No scleral icterus.    Conjunctiva/sclera: Conjunctivae normal.  Neck:     Musculoskeletal: Normal range of motion and neck supple.  Cardiovascular:     Rate and Rhythm: Regular rhythm. Tachycardia present.     Heart sounds: No murmur.  Pulmonary:     Effort: Pulmonary effort is normal. No respiratory distress.     Breath sounds: No stridor. No wheezing, rhonchi or rales.  Chest:     Chest wall: No tenderness.  Abdominal:     General: There is no distension.     Palpations: Abdomen is soft. There is no mass.     Tenderness: There is abdominal tenderness. There is guarding. There is no right CVA tenderness, left CVA tenderness or rebound.     Hernia: No hernia is present.     Comments: TTP in the suprapubic region with guarding. No rebound. Abdomen is soft, non-distended.  Upper abdominal exam is unremarkable.  No tenderness over McBurney's point.  Genitourinary:    Penis: Normal. No phimosis, paraphimosis, erythema or tenderness.      Scrotum/Testes:        Right:  Swelling not present.        Left: Swelling not present.     Comments: Chaperaoned exam. Enlarged prostate. Non-thrombosed external hemorrhoid. Normal rectal tone.  Lymphadenopathy:     Lower Body: No right inguinal adenopathy. No left inguinal adenopathy.  Skin:    General: Skin is warm and dry.  Neurological:     Mental Status: He is alert.     Comments: Oriented to person, place, and year.   Psychiatric:        Behavior: Behavior is agitated.     ED Treatments / Results  Labs (all labs ordered are listed, but only abnormal results are displayed) Labs Reviewed  URINALYSIS, ROUTINE W REFLEX MICROSCOPIC - Abnormal; Notable for the following components:      Result Value   APPearance CLOUDY (*)    Specific Gravity, Urine 1.004 (*)    Hgb urine dipstick LARGE (*)    Protein, ur 100 (*)    Leukocytes,Ua LARGE (*)    RBC / HPF >50 (*)    WBC, UA >50 (*)    Bacteria, UA RARE (*)    All other components within normal limits  CBC WITH DIFFERENTIAL/PLATELET - Abnormal; Notable for the following components:   WBC 14.5 (*)    RBC 3.46 (*)    Hemoglobin 10.7 (*)    HCT 32.1 (*)    Neutro Abs 12.9 (*)    All other components within normal limits  COMPREHENSIVE METABOLIC PANEL - Abnormal; Notable for the following components:   Sodium 129 (*)    Chloride 97 (*)    CO2 21 (*)    Glucose, Bld 113 (*)    BUN 26 (*)    Creatinine, Ser 1.34 (*)    Total Bilirubin 1.6 (*)    GFR calc non Af Amer 47 (*)    GFR calc Af Amer 55 (*)    All other components within normal limits  URINE CULTURE  SARS CORONAVIRUS 2 (HOSPITAL ORDER, [place] [facility])  CULTURE, BLOOD (ROUTINE X 2)  CULTURE, BLOOD (ROUTINE X 2)  LACTIC ACID, PLASMA  LACTIC ACID, PLASMA  COMPREHENSIVE METABOLIC PANEL  CBC WITH DIFFERENTIAL/PLATELET  SODIUM, URINE, RANDOM  OSMOLALITY, URINE    EKG None  Radiology No results found.  Procedures .Critical Care Performed by: Joanne Gavel, PA-C  Authorized by: Joanne Gavel, PA-C   Critical care provider statement:  Critical care time (minutes):  40   Critical care time was exclusive of:  Separately billable procedures and treating other patients and teaching time   Critical care was necessary to treat or prevent imminent or life-threatening deterioration of the following conditions:  Sepsis   Critical care was time spent personally by me on the following activities:  Ordering and performing treatments and interventions, ordering and review of laboratory studies, ordering and review of radiographic studies, pulse oximetry, re-evaluation of patient's condition, review of old charts, obtaining history from patient or surrogate, examination of patient, evaluation of patient's response to treatment and development of treatment plan with patient or surrogate   (including critical care time)  Medications Ordered in ED Medications  aspirin EC tablet 81 mg (has no administration in time range)  alfuzosin (UROXATRAL) 24 hr tablet 10 mg (has no administration in time range)  finasteride (PROSCAR) tablet 5 mg (has no administration in time range)  heparin injection 5,000 Units (has no administration in time range)  sodium chloride flush (NS) 0.9 % injection 3 mL (has no administration in time range)  sodium chloride flush (NS) 0.9 % injection 3 mL (has no administration in time range)  0.9 %  sodium chloride infusion (has no administration in time range)  acetaminophen (TYLENOL) tablet 650 mg (has no administration in time range)    Or  acetaminophen (TYLENOL) suppository 650 mg (has no administration in time range)  HYDROcodone-acetaminophen (NORCO/VICODIN) 5-325 MG per tablet 1 tablet (has no administration in time range)  senna-docusate (Senokot-S) tablet 1 tablet (has no administration in time range)  ondansetron (ZOFRAN) tablet 4 mg (has no administration in time range)    Or  ondansetron (ZOFRAN) injection 4 mg (has no  administration in time range)  ceFEPIme (MAXIPIME) 2 g in sodium chloride 0.9 % 100 mL IVPB (has no administration in time range)  fentaNYL (SUBLIMAZE) injection 50 mcg (50 mcg Intravenous Given 09/20/18 2337)  lidocaine (XYLOCAINE) 2 % jelly 1 application (1 application Urethral Given 09/21/18 0026)  cefTRIAXone (ROCEPHIN) 1 g in sodium chloride 0.9 % 100 mL IVPB (0 g Intravenous Stopped 09/21/18 0145)  acetaminophen (TYLENOL) suppository 975 mg (975 mg Rectal Given 09/21/18 0100)  fentaNYL (SUBLIMAZE) injection 50 mcg (50 mcg Intravenous Given 09/21/18 0235)  sodium chloride 0.9 % bolus 1,000 mL (1,000 mLs Intravenous New Bag/Given 09/21/18 0326)    And  sodium chloride 0.9 % bolus 1,000 mL (0 mLs Intravenous Stopped 09/21/18 0300)    And  sodium chloride 0.9 % bolus 250 mL (250 mLs Intravenous New Bag/Given 09/21/18 0443)     Initial Impression / Assessment and Plan / ED Course  I have reviewed the triage vital signs and the nursing notes.  Pertinent labs & imaging results that were available during my care of the patient were reviewed by me and considered in my medical decision making (see chart for details).        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  Patient had previously had 2 episodes of urinary incontinence earlier today after having Foley catheter removed.  Mildly tachycardic on arrival with oral temp of 99.6.  Rectal temp of 101.3.  Leukocytosis of 14.5. Lactate is normal. BP 100/60s on arrival, decreasing to 90/50s.  Code sepsis initiated.  Likely urinary source of urine appears infectious.  Rocephin given for complicated UTI.  Labs are also notable for hyponatremia of 129 and mildly elevated creatinine.  30 cc/kg IV fluid bolus was given as patient meets septic shock criteria.  EF 60 to 65% on echo in  2018. CT abdomen pelvis from last month reviewed with no evidence of nephrolithiasis.  Bladder scan with <200 mLs. Foley catheter placed with hematuria noted in collection bag.  The patient was seen and independently evaluated by Dr. Clydell Hakim, attending physician.  Pain significantly improved after fentanyl. Consulted hospitalist team for admission for sepsis secondary to urinary source.  COVID-19 test is negative.  Dr. Myna Hidalgo has accepted the patient for admission. The patient appears reasonably stabilized for admission considering the current resources, flow, and capabilities available in the ED at this time, and I doubt any other [facility] requiring further screening and/or treatment in the ED prior to admission.  Final Clinical Impressions(s) / ED Diagnoses   Final diagnoses:  Sepsis, due to unspecified organism, unspecified whether acute organ dysfunction present [facility])  Acute cystitis with hematuria    ED Discharge Orders    None       Joanne Gavel, PA-C 09/21/18 [place], April, MD 09/21/18 0510    [place], April, MD 09/21/18 [id number]

## 2018-09-20 NOTE — ED Triage Notes (Signed)
Per wife, patient was admitted in May with urosepsis. States foley catheter was removed today. Since that time patient c/o lower abdominal pain. States he has been urinating and had two episodes of incontinence.

## 2018-09-21 ENCOUNTER — Encounter (HOSPITAL_COMMUNITY): Payer: Self-pay | Admitting: Emergency Medicine

## 2018-09-21 DIAGNOSIS — E871 Hypo-osmolality and hyponatremia: Secondary | ICD-10-CM | POA: Diagnosis not present

## 2018-09-21 DIAGNOSIS — D649 Anemia, unspecified: Secondary | ICD-10-CM | POA: Diagnosis present

## 2018-09-21 DIAGNOSIS — R739 Hyperglycemia, unspecified: Secondary | ICD-10-CM | POA: Diagnosis present

## 2018-09-21 DIAGNOSIS — Z7982 Long term (current) use of aspirin: Secondary | ICD-10-CM | POA: Diagnosis not present

## 2018-09-21 DIAGNOSIS — N390 Urinary tract infection, site not specified: Secondary | ICD-10-CM

## 2018-09-21 DIAGNOSIS — R32 Unspecified urinary incontinence: Secondary | ICD-10-CM | POA: Diagnosis not present

## 2018-09-21 DIAGNOSIS — R42 Dizziness and giddiness: Secondary | ICD-10-CM | POA: Diagnosis not present

## 2018-09-21 DIAGNOSIS — N138 Other obstructive and reflux uropathy: Secondary | ICD-10-CM | POA: Diagnosis present

## 2018-09-21 DIAGNOSIS — G3189 Other specified degenerative diseases of nervous system: Secondary | ICD-10-CM | POA: Diagnosis not present

## 2018-09-21 DIAGNOSIS — Z8551 Personal history of malignant neoplasm of bladder: Secondary | ICD-10-CM | POA: Diagnosis not present

## 2018-09-21 DIAGNOSIS — Z79899 Other long term (current) drug therapy: Secondary | ICD-10-CM | POA: Diagnosis not present

## 2018-09-21 DIAGNOSIS — E872 Acidosis: Secondary | ICD-10-CM | POA: Diagnosis present

## 2018-09-21 DIAGNOSIS — R17 Unspecified jaundice: Secondary | ICD-10-CM | POA: Diagnosis present

## 2018-09-21 DIAGNOSIS — I951 Orthostatic hypotension: Secondary | ICD-10-CM | POA: Diagnosis present

## 2018-09-21 DIAGNOSIS — Z1159 Encounter for screening for other viral diseases: Secondary | ICD-10-CM | POA: Diagnosis not present

## 2018-09-21 DIAGNOSIS — E861 Hypovolemia: Secondary | ICD-10-CM | POA: Diagnosis present

## 2018-09-21 DIAGNOSIS — N136 Pyonephrosis: Secondary | ICD-10-CM | POA: Diagnosis present

## 2018-09-21 DIAGNOSIS — Z87891 Personal history of nicotine dependence: Secondary | ICD-10-CM | POA: Diagnosis not present

## 2018-09-21 DIAGNOSIS — N1339 Other hydronephrosis: Secondary | ICD-10-CM | POA: Diagnosis not present

## 2018-09-21 DIAGNOSIS — N289 Disorder of kidney and ureter, unspecified: Secondary | ICD-10-CM

## 2018-09-21 DIAGNOSIS — A419 Sepsis, unspecified organism: Secondary | ICD-10-CM | POA: Diagnosis not present

## 2018-09-21 DIAGNOSIS — R338 Other retention of urine: Secondary | ICD-10-CM | POA: Diagnosis not present

## 2018-09-21 DIAGNOSIS — N401 Enlarged prostate with lower urinary tract symptoms: Secondary | ICD-10-CM | POA: Diagnosis present

## 2018-09-21 LAB — COMPREHENSIVE METABOLIC PANEL
ALT: 11 U/L (ref 0–44)
ALT: 13 U/L (ref 0–44)
AST: 18 U/L (ref 15–41)
AST: 22 U/L (ref 15–41)
Albumin: 2.8 g/dL — ABNORMAL LOW (ref 3.5–5.0)
Albumin: 3.8 g/dL (ref 3.5–5.0)
Alkaline Phosphatase: 58 U/L (ref 38–126)
Alkaline Phosphatase: 77 U/L (ref 38–126)
Anion gap: 10 (ref 5–15)
Anion gap: 11 (ref 5–15)
BUN: 21 mg/dL (ref 8–23)
BUN: 26 mg/dL — ABNORMAL HIGH (ref 8–23)
CO2: 19 mmol/L — ABNORMAL LOW (ref 22–32)
CO2: 21 mmol/L — ABNORMAL LOW (ref 22–32)
Calcium: 8.1 mg/dL — ABNORMAL LOW (ref 8.9–10.3)
Calcium: 9 mg/dL (ref 8.9–10.3)
Chloride: 107 mmol/L (ref 98–111)
Chloride: 97 mmol/L — ABNORMAL LOW (ref 98–111)
Creatinine, Ser: 1.2 mg/dL (ref 0.61–1.24)
Creatinine, Ser: 1.34 mg/dL — ABNORMAL HIGH (ref 0.61–1.24)
GFR calc Af Amer: 55 mL/min — ABNORMAL LOW (ref >60)
GFR calc Af Amer: >60 mL/min (ref >60)
GFR calc non Af Amer: 47 mL/min — ABNORMAL LOW (ref >60)
GFR calc non Af Amer: 54 mL/min — ABNORMAL LOW (ref >60)
Glucose, Bld: 113 mg/dL — ABNORMAL HIGH (ref 70–99)
Glucose, Bld: 124 mg/dL — ABNORMAL HIGH (ref 70–99)
Potassium: 4.1 mmol/L (ref 3.5–5.1)
Potassium: 4.7 mmol/L (ref 3.5–5.1)
Sodium: 129 mmol/L — ABNORMAL LOW (ref 135–145)
Sodium: 136 mmol/L (ref 135–145)
Total Bilirubin: 0.8 mg/dL (ref 0.3–1.2)
Total Bilirubin: 1.6 mg/dL — ABNORMAL HIGH (ref 0.3–1.2)
Total Protein: 5 g/dL — ABNORMAL LOW (ref 6.5–8.1)
Total Protein: 6.6 g/dL (ref 6.5–8.1)

## 2018-09-21 LAB — SODIUM, URINE, RANDOM: Sodium, Ur: 27 mmol/L

## 2018-09-21 LAB — CBC WITH DIFFERENTIAL/PLATELET
Abs Immature Granulocytes: 0.05 10*3/uL (ref 0.00–0.07)
Abs Immature Granulocytes: 0.05 10*3/uL (ref 0.00–0.07)
Basophils Absolute: 0 10*3/uL (ref 0.0–0.1)
Basophils Absolute: 0 10*3/uL (ref 0.0–0.1)
Basophils Relative: 0 %
Basophils Relative: 0 %
Eosinophils Absolute: 0 10*3/uL (ref 0.0–0.5)
Eosinophils Absolute: 0 10*3/uL (ref 0.0–0.5)
Eosinophils Relative: 0 %
Eosinophils Relative: 0 %
HCT: 30.9 % — ABNORMAL LOW (ref 39.0–52.0)
HCT: 32.1 % — ABNORMAL LOW (ref 39.0–52.0)
Hemoglobin: 10 g/dL — ABNORMAL LOW (ref 13.0–17.0)
Hemoglobin: 10.7 g/dL — ABNORMAL LOW (ref 13.0–17.0)
Immature Granulocytes: 0 %
Immature Granulocytes: 0 %
Lymphocytes Relative: 5 %
Lymphocytes Relative: 6 %
Lymphs Abs: 0.7 10*3/uL (ref 0.7–4.0)
Lymphs Abs: 0.9 10*3/uL (ref 0.7–4.0)
MCH: 30.6 pg (ref 26.0–34.0)
MCH: 30.9 pg (ref 26.0–34.0)
MCHC: 32.4 g/dL (ref 30.0–36.0)
MCHC: 33.3 g/dL (ref 30.0–36.0)
MCV: 92.8 fL (ref 80.0–100.0)
MCV: 94.5 fL (ref 80.0–100.0)
Monocytes Absolute: 0.6 10*3/uL (ref 0.1–1.0)
Monocytes Absolute: 0.7 10*3/uL (ref 0.1–1.0)
Monocytes Relative: 5 %
Monocytes Relative: 5 %
Neutro Abs: 11.5 10*3/uL — ABNORMAL HIGH (ref 1.7–7.7)
Neutro Abs: 12.9 10*3/uL — ABNORMAL HIGH (ref 1.7–7.7)
Neutrophils Relative %: 89 %
Neutrophils Relative %: 90 %
Platelets: 168 10*3/uL (ref 150–400)
Platelets: 186 10*3/uL (ref 150–400)
RBC: 3.27 MIL/uL — ABNORMAL LOW (ref 4.22–5.81)
RBC: 3.46 MIL/uL — ABNORMAL LOW (ref 4.22–5.81)
RDW: 13.1 % (ref 11.5–15.5)
RDW: 13.2 % (ref 11.5–15.5)
WBC: 12.9 10*3/uL — ABNORMAL HIGH (ref 4.0–10.5)
WBC: 14.5 10*3/uL — ABNORMAL HIGH (ref 4.0–10.5)
nRBC: 0 % (ref 0.0–0.2)
nRBC: 0 % (ref 0.0–0.2)

## 2018-09-21 LAB — URINALYSIS, ROUTINE W REFLEX MICROSCOPIC
Bilirubin Urine: NEGATIVE
Glucose, UA: NEGATIVE mg/dL
Ketones, ur: NEGATIVE mg/dL
Nitrite: NEGATIVE
Protein, ur: 100 mg/dL — AB
RBC / HPF: >50 RBC/hpf — ABNORMAL HIGH (ref 0–5)
Specific Gravity, Urine: 1.004 — ABNORMAL LOW (ref 1.005–1.030)
WBC, UA: >50 WBC/hpf — ABNORMAL HIGH (ref 0–5)
pH: 7 (ref 5.0–8.0)

## 2018-09-21 LAB — SARS CORONAVIRUS 2 BY RT PCR (HOSPITAL ORDER, PERFORMED IN ~~LOC~~ HOSPITAL LAB): SARS Coronavirus 2: NEGATIVE

## 2018-09-21 LAB — LACTIC ACID, PLASMA
Lactic Acid, Venous: 0.9 mmol/L (ref 0.5–1.9)
Lactic Acid, Venous: 0.7 mmol/L (ref 0.5–1.9)

## 2018-09-21 LAB — OSMOLALITY, URINE: Osmolality, Ur: 162 mOsm/kg — ABNORMAL LOW (ref 300–900)

## 2018-09-21 MED ORDER — LIDOCAINE HCL URETHRAL/MUCOSAL 2 % EX GEL
1.0000 "application " | Freq: Once | CUTANEOUS | Status: AC
  Administered 2018-09-21: 1 via URETHRAL
  Filled 2018-09-21: qty 5

## 2018-09-21 MED ORDER — ACETAMINOPHEN 500 MG PO TABS
1000.0000 mg | ORAL_TABLET | Freq: Once | ORAL | Status: DC
  Filled 2018-09-21: qty 2

## 2018-09-21 MED ORDER — SODIUM CHLORIDE 0.9 % IV BOLUS (SEPSIS)
1000.0000 mL | Freq: Once | INTRAVENOUS | Status: AC
  Administered 2018-09-21: 1000 mL via INTRAVENOUS

## 2018-09-21 MED ORDER — SODIUM CHLORIDE 0.9% FLUSH: 3.0000 mL | INTRAVENOUS | Status: DC | PRN

## 2018-09-21 MED ORDER — HEPARIN SODIUM (PORCINE) 5000 UNIT/ML IJ SOLN
5000.0000 [IU] | Freq: Three times a day (TID) | INTRAMUSCULAR | Status: DC
  Administered 2018-09-21 – 2018-09-23 (×7): 5000 [IU] via SUBCUTANEOUS
  Filled 2018-09-21 (×7): qty 1

## 2018-09-21 MED ORDER — ONDANSETRON HCL 4 MG/2ML IJ SOLN: 4.0000 mg | Freq: Four times a day (QID) | INTRAMUSCULAR | Status: DC | PRN

## 2018-09-21 MED ORDER — ONDANSETRON HCL 4 MG PO TABS: 4.0000 mg | ORAL_TABLET | Freq: Four times a day (QID) | ORAL | Status: DC | PRN

## 2018-09-21 MED ORDER — ACETAMINOPHEN 650 MG RE SUPP
975.0000 mg | Freq: Once | RECTAL | Status: AC
  Administered 2018-09-21: 975 mg via RECTAL
  Filled 2018-09-21: qty 1

## 2018-09-21 MED ORDER — SODIUM CHLORIDE 0.9 % IV SOLN
2.0000 g | Freq: Two times a day (BID) | INTRAVENOUS | Status: DC
  Administered 2018-09-21 – 2018-09-23 (×5): 2 g via INTRAVENOUS
  Filled 2018-09-21 (×5): qty 2

## 2018-09-21 MED ORDER — FENTANYL CITRATE (PF) 100 MCG/2ML IJ SOLN
50.0000 ug | Freq: Once | INTRAMUSCULAR | Status: AC
  Administered 2018-09-21: 03:00:00 50 ug via INTRAVENOUS
  Filled 2018-09-21: qty 2

## 2018-09-21 MED ORDER — SODIUM CHLORIDE 0.9% FLUSH
3.0000 mL | Freq: Two times a day (BID) | INTRAVENOUS | Status: DC
  Administered 2018-09-21 – 2018-09-22 (×3): 3 mL via INTRAVENOUS

## 2018-09-21 MED ORDER — SENNOSIDES-DOCUSATE SODIUM 8.6-50 MG PO TABS: 1.0000 | ORAL_TABLET | Freq: Every evening | ORAL | Status: DC | PRN

## 2018-09-21 MED ORDER — SODIUM CHLORIDE 0.9 % IV SOLN
INTRAVENOUS | Status: DC
  Administered 2018-09-21 – 2018-09-22 (×2): via INTRAVENOUS

## 2018-09-21 MED ORDER — HYDROCODONE-ACETAMINOPHEN 5-325 MG PO TABS: 1.0000 | ORAL_TABLET | ORAL | Status: DC | PRN

## 2018-09-21 MED ORDER — ACETAMINOPHEN 325 MG PO TABS
650.0000 mg | ORAL_TABLET | Freq: Four times a day (QID) | ORAL | Status: DC | PRN
  Administered 2018-09-21: 650 mg via ORAL
  Filled 2018-09-21: qty 2

## 2018-09-21 MED ORDER — SODIUM CHLORIDE 0.9 % IV SOLN: 250.0000 mL | INTRAVENOUS | Status: DC | PRN

## 2018-09-21 MED ORDER — SODIUM CHLORIDE 0.9 % IV SOLN
1.0000 g | Freq: Once | INTRAVENOUS | Status: AC
  Administered 2018-09-21: 01:00:00 1 g via INTRAVENOUS
  Filled 2018-09-21: qty 10

## 2018-09-21 MED ORDER — ALFUZOSIN HCL ER 10 MG PO TB24
10.0000 mg | ORAL_TABLET | Freq: Every day | ORAL | Status: DC
  Administered 2018-09-21 – 2018-09-23 (×3): 10 mg via ORAL
  Filled 2018-09-21 (×3): qty 1

## 2018-09-21 MED ORDER — ASPIRIN EC 81 MG PO TBEC
81.0000 mg | DELAYED_RELEASE_TABLET | Freq: Every day | ORAL | Status: DC
  Administered 2018-09-21 – 2018-09-23 (×3): 81 mg via ORAL
  Filled 2018-09-21 (×3): qty 1

## 2018-09-21 MED ORDER — ACETAMINOPHEN 650 MG RE SUPP: 650.0000 mg | Freq: Four times a day (QID) | RECTAL | Status: DC | PRN

## 2018-09-21 MED ORDER — SODIUM CHLORIDE 0.9 % IV BOLUS: 1000.0000 mL | Freq: Once | INTRAVENOUS | Status: DC

## 2018-09-21 MED ORDER — POLYVINYL ALCOHOL 1.4 % OP SOLN
1.0000 [drp] | OPHTHALMIC | Status: DC | PRN
  Administered 2018-09-22: 1 [drp] via OPHTHALMIC
  Filled 2018-09-21: qty 15

## 2018-09-21 MED ORDER — SODIUM CHLORIDE 0.9 % IV BOLUS (SEPSIS)
250.0000 mL | Freq: Once | INTRAVENOUS | Status: AC
  Administered 2018-09-21: 250 mL via INTRAVENOUS

## 2018-09-21 MED ORDER — FINASTERIDE 5 MG PO TABS
5.0000 mg | ORAL_TABLET | Freq: Every day | ORAL | Status: DC
  Administered 2018-09-21 – 2018-09-23 (×3): 5 mg via ORAL
  Filled 2018-09-21 (×3): qty 1

## 2018-09-21 MED ORDER — SODIUM CHLORIDE 0.9 % IV BOLUS
1000.0000 mL | Freq: Once | INTRAVENOUS | Status: AC
  Administered 2018-09-21: 1000 mL via INTRAVENOUS

## 2018-09-21 NOTE — Progress Notes (Addendum)
The Patient was admitted early this morning after midnight and H&P has been reviewed and I am in current agreement with assessment and plan done by Dr. Mitzi Hansen.  Additional changes to the plan of care been made accordingly.  The patient is an 85 year old with a past medical history significant for but not limited to AAA, BPH with urinary obstruction, history of bladder cancer, history of hydronephrosis on the left side, osteoarthritis, seasonal allergic rhinitis as well as other comorbidities who recently was admitted last month with UTI and urinary retention now presented to the ED lower abdominal pain after his Foley catheter removed earlier in the day.  He is also having some mild nonspecific malaise in the day and Foley catheter removed by urologist and patient has been home drink a lot of water and attempt to urinate and went on to develop severe pain in the abdomen.  In the ED is found to be febrile, slightly tachycardic with a systolic blood pressure of 91 and CMP showed a sodium of 129 and creatinine 1.34.  He had a mild leukocytosis of 14,500 and stable normocytic anemia.  Urinalysis was concerning for infection and blood and urine cultures were collected and patient was started on sepsis protocol with 30 cc/kg and then placed on empiric antibiotics with IV Rocephin.  Patient's sodium as well as his renal function is improved and WBC is trending downwards.  Currently he is being treated for the following:  Sepsis secondary to UTI  - Presents with lower abdominal pain and is found to have fever, leukocytosis, tachycardia, and UA concerning for UTI  - Blood and urine cultures collected in ED, 30 cc/kg NS bolus given, and empiric antibiotics started  - Continue empiric antibiotic with IV Cefepime, follow cultures and clinical course  -Foley Catheter was replaced  Hyponatremia, improved  - Serum sodium is 129 in ED and now is 136 - Patient appears hypovolemic and wife reports he was drinking a  lot of water in attempt to urinate after having Foley removed  - He has received 30 cc/kg NS in ED; C/w Maintenance IVF as below  - Check urine sodium and urine osm, repeat chem panel in am    Mild renal insufficiency, improving  - SCr is 1.34 on admission, up from 1.11 a month ago  - He was given 30 cc/kg NS in ED; C/w Maintenance IVF with NS at 75 mL/hr x 12 more hours  - Renally-dose medications, repeat chem panel in am   Leukocytosis -Trending down. WBC went from 14.5 -> 12.9 -In the setting of Infection -Continue to Monitor and Follow Cx's -Repeat CBC in AM  Normocytic Anemia -Patient's Hgb/Hct went from 10.7/32.1 -> 10.0/30.9 -Check Anemia Panel in the AM -Continue to Monitor for S/Sx of Bleeding; Currently no overt Bleeding noted -Repeat CBC in AM    Hyperbilirubinemia -Likely Reactive and Trending down -T Bili went from 1.6 -> 0.8 -Continue to Monitor and Trend and Repeat CMP in AM   Metabolic Acidosis -Mild as CO2 was 19. AG was 10 -C/w IVF as above -Continue to Monitor and Trend -Repeat CMP in AM  Hyperglycemia -Likely Reactive; Blood Glucose was 113 on admit and repeat was 124 -Check HbA1c to r/o Diabetes -Continue To monitor blood sugars carefully and if consistently remains elevated will need to place on sensitive NovoLog sliding scale insulin AC  We will Continue to monitor patient's clinical response to intervention and repeat blood work in the a.m.

## 2018-09-21 NOTE — ED Notes (Signed)
ED TO INPATIENT HANDOFF REPORT  ED Nurse Name and Phone #: Judye Bos Name/Age/Gender Benjamin Cardenas 85 y.o. male Room/Bed: WA14/WA14  Code Status   Code Status: Prior  Home/SNF/Other Home Patient oriented to: self Is this baseline? NO  Triage Complete: Triage complete  Chief Complaint abd pain; dysuria  Triage Note Per wife, patient was admitted in May with urosepsis. States foley catheter was removed today. Since that time patient c/o lower abdominal pain. States he has been urinating and had two episodes of incontinence.    Allergies Allergies  Allergen Reactions  . Amoxicillin Rash    Has patient had a PCN reaction causing immediate rash, facial/tongue/throat swelling, SOB or lightheadedness with hypotension:No Has patient had a PCN reaction causing severe rash involving mucus membranes or skin necrosis:unsure Has patient had a PCN reaction that required hospitalization:No Has patient had a PCN reaction occurring within the last 10 years:unsure If all of the above answers are "NO", then may proceed with Cephalosporin use.   . Ciprofloxacin Swelling and Rash  . Sulfasalazine Swelling  . Sulfa Antibiotics Swelling  . Doxycycline Hyclate Rash  . Levofloxacin Rash    Level of Care/Admitting Diagnosis ED Disposition    ED Disposition Condition Comment   Admit  Hospital Area: [place] [[id number]]  Level of Care: Med-Surg [16]  Covid Evaluation: Screening Protocol (No Symptoms)  Diagnosis: Sepsis secondary to UTI [facility]) [[id number]]  Admitting Physician: Vianne Bulls [[id number]]  Attending Physician: Vianne Bulls [[id number]]  PT Class (Do Not Modify): Observation [104]  PT Acc Code (Do Not Modify): Observation [10022]       B Medical/Surgery History Past Medical History:  Diagnosis Date  . Bladder cancer [facility]) urology-- dr Karsten Ro   dx 11/ 2016  Transitional cell carcinoma, High grade invasive , Stage T1  G3 ---  s/p  TURBT and Mitomycin C  instillation  . BPH with urinary obstruction   . Dry skin dermatitis   . Elevated PSA   . History of colon polyps   . Hydronephrosis, left   . Lower urinary tract symptoms (LUTS)   . OA (osteoarthritis)   . Prostate nodule   . Seasonal allergic rhinitis   . Wears glasses    Past Surgical History:  Procedure Laterality Date  . cataract surgery    . CYSTOSCOPY WITH BIOPSY N/A 04/22/2015   Procedure: CYSTOSCOPY WITH BLADDER BIOPSY;  Surgeon: Kathie Rhodes, MD;  Location: [facility];  Service: Urology;  Laterality: N/A;  . CYSTOSCOPY WITH RETROGRADE PYELOGRAM, URETEROSCOPY AND STENT PLACEMENT Left 02/25/2015   Procedure: LEFT RETROGRADE PYELOGRAM, URETEROSCOPY ;  Surgeon: Kathie Rhodes, MD;  Location: [facility];  Service: Urology;  Laterality: Left;  . HERNIA REPAIR  age 31 1937   rupture right inguinal hernia  . INGUINAL HERNIA REPAIR  03/02/2011   Procedure: HERNIA REPAIR INGUINAL ADULT;  Surgeon: Earnstine Regal, MD;  Location: [facility];  Service: General;  Laterality: Left;  with mesh   . TRANSURETHRAL RESECTION OF BLADDER TUMOR WITH GYRUS (TURBT-GYRUS) N/A 02/25/2015   Procedure: TRANSURETHRAL RESECTION OF BLADDER TUMOR WITH GYRUS (TURBT-GYRUS);  Surgeon: Kathie Rhodes, MD;  Location: [facility];  Service: Urology;  Laterality: N/A;     A IV Location/Drains/Wounds Patient Lines/Drains/Airways Status   Active Line/Drains/Airways    Name:   Placement date:   Placement time:   Site:   Days:   Peripheral IV 09/20/18 Left Antecubital   09/20/18  2334    Antecubital   1   Peripheral IV 09/21/18 Right;Upper Arm   09/21/18    0039    Arm   less than 1   Urethral Catheter Nilsa Nutting RN Coude 18 Fr.   09/21/18    0027    Coude   less than 1          Intake/Output Last 24 hours  Intake/Output Summary (Last 24 hours) at 09/21/2018 0410 Last data filed at 09/21/2018 0300 Gross per 24 hour  Intake 1088 ml  Output 300 ml  Net 788 ml     Labs/Imaging Results for orders placed or performed during the hospital encounter of 09/20/18 (from the past 48 hour(s))  CBC with Differential     Status: Abnormal   Collection Time: 09/20/18 11:34 PM  Result Value Ref Range   WBC 14.5 (H) 4.0 - 10.5 K/uL   RBC 3.46 (L) 4.22 - 5.81 MIL/uL   Hemoglobin 10.7 (L) 13.0 - 17.0 g/dL   HCT 32.1 (L) 39.0 - 52.0 %   MCV 92.8 80.0 - 100.0 fL   MCH 30.9 26.0 - 34.0 pg   MCHC 33.3 30.0 - 36.0 g/dL   RDW 13.1 11.5 - 15.5 %   Platelets 186 150 - 400 K/uL   nRBC 0.0 0.0 - 0.2 %   Neutrophils Relative % 89 %   Neutro Abs 12.9 (H) 1.7 - 7.7 K/uL   Lymphocytes Relative 6 %   Lymphs Abs 0.9 0.7 - 4.0 K/uL   Monocytes Relative 5 %   Monocytes Absolute 0.7 0.1 - 1.0 K/uL   Eosinophils Relative 0 %   Eosinophils Absolute 0.0 0.0 - 0.5 K/uL   Basophils Relative 0 %   Basophils Absolute 0.0 0.0 - 0.1 K/uL   Immature Granulocytes 0 %   Abs Immature Granulocytes 0.05 0.00 - 0.07 K/uL    Comment: Performed at [facility], [place] [address], [place], [place] [address]  Comprehensive metabolic panel     Status: Abnormal   Collection Time: 09/20/18 11:34 PM  Result Value Ref Range   Sodium 129 (L) 135 - 145 mmol/L   Potassium 4.7 3.5 - 5.1 mmol/L   Chloride 97 (L) 98 - 111 mmol/L   CO2 21 (L) 22 - 32 mmol/L   Glucose, Bld 113 (H) 70 - 99 mg/dL   BUN 26 (H) 8 - 23 mg/dL   Creatinine, Ser 1.34 (H) 0.61 - 1.24 mg/dL   Calcium 9.0 8.9 - 10.3 mg/dL   Total Protein 6.6 6.5 - 8.1 g/dL   Albumin 3.8 3.5 - 5.0 g/dL   AST 22 15 - 41 U/L   ALT 13 0 - 44 U/L   Alkaline Phosphatase 77 38 - 126 U/L   Total Bilirubin 1.6 (H) 0.3 - 1.2 mg/dL   GFR calc non Af Amer 47 (L) >60 mL/min   GFR calc Af Amer 55 (L) >60 mL/min   Anion gap 11 5 - 15    Comment: Performed at [facility], [place] [address] [place], [place] [address]  Urinalysis, Routine w reflex microscopic     Status: Abnormal   Collection Time: 09/21/18 12:27 AM   Result Value Ref Range   Color, Urine YELLOW YELLOW   APPearance CLOUDY (A) CLEAR   Specific Gravity, Urine 1.004 (L) 1.005 - 1.030   pH 7.0 5.0 - 8.0   Glucose, UA NEGATIVE NEGATIVE mg/dL   Hgb urine dipstick LARGE (A) NEGATIVE  Bilirubin Urine NEGATIVE NEGATIVE   Ketones, ur NEGATIVE NEGATIVE mg/dL   Protein, ur 100 (A) NEGATIVE mg/dL   Nitrite NEGATIVE NEGATIVE   Leukocytes,Ua LARGE (A) NEGATIVE   RBC / HPF >50 (H) 0 - 5 RBC/hpf   WBC, UA >50 (H) 0 - 5 WBC/hpf   Bacteria, UA RARE (A) NONE SEEN   Squamous Epithelial / LPF 0-5 0 - 5   WBC Clumps PRESENT     Comment: Performed at [facility], [place] [address], [place], [place] [address]  Lactic acid, plasma     Status: None   Collection Time: 09/21/18  2:35 AM  Result Value Ref Range   Lactic Acid, Venous 0.9 0.5 - 1.9 mmol/L    Comment: Performed at [facility], [place] [address], [place], [place] [address]   No results found.  Pending Labs [employer] (From admission, onward)    Start     Ordered   09/21/18 0126  SARS Coronavirus 2 (CEPHEID- Performed in [facility] hospital lab), Hosp Order  (Symptomatic Patients Labs with Precautions )  Once,   STAT     09/21/18 0125   09/21/18 0120  Lactic acid, plasma  Now then every 2 hours,   STAT     09/21/18 0124   09/21/18 0016  Blood culture (routine x 2)  BLOOD CULTURE X 2,   STAT     09/21/18 0017   09/20/18 2306  Urine Culture  Add-on,   AD     09/20/18 2307   Signed and Held  Lactic acid, plasma  ONCE - STAT,   STAT     Signed and Held   Signed and Held  Comprehensive metabolic panel  Tomorrow morning,   R     Signed and Held   Signed and Held  CBC WITH DIFFERENTIAL  Tomorrow morning,   R     Signed and Held   Signed and Held  Sodium, urine, random  Add-on,   R     Signed and Held   Signed and Held  Osmolality, urine  Add-on,   R     Signed and Held          Vitals/Pain Today's Vitals   09/21/18 0238 09/21/18 0330 09/21/18  0345 09/21/18 0400  BP: (!) 98/57 (!) 99/58 (!) 95/54 93/60  Pulse: 90 90 89 91  Resp:  20 14 19   Temp: 99.1 F (37.3 C)     TempSrc: Oral     SpO2: 97% 93% 94% 94%    Isolation Precautions Droplet and Contact precautions  Medications Medications  sodium chloride 0.9 % bolus 1,000 mL (1,000 mLs Intravenous New Bag/Given 09/21/18 0326)    And  sodium chloride 0.9 % bolus 1,000 mL (0 mLs Intravenous Stopped 09/21/18 0300)    And  sodium chloride 0.9 % bolus 250 mL (has no administration in time range)  ceFEPIme (MAXIPIME) 2 g in sodium chloride 0.9 % 100 mL IVPB (has no administration in time range)  fentaNYL (SUBLIMAZE) injection 50 mcg (50 mcg Intravenous Given 09/20/18 2337)  lidocaine (XYLOCAINE) 2 % jelly 1 application (1 application Urethral Given 09/21/18 0026)  cefTRIAXone (ROCEPHIN) 1 g in sodium chloride 0.9 % 100 mL IVPB (0 g Intravenous Stopped 09/21/18 0145)  acetaminophen (TYLENOL) suppository 975 mg (975 mg Rectal Given 09/21/18 0100)  fentaNYL (SUBLIMAZE) injection 50 mcg (50 mcg Intravenous Given 09/21/18 0235)    Mobility walks Low fall risk   Focused Assessments  NA   R Recommendations: See Admitting Provider Note  Report given to:   Additional Notes: NA

## 2018-09-21 NOTE — Progress Notes (Signed)
Pt states that he feels dizzy, BP 87/48, MAP 61. MD notified. Awaiting orders.

## 2018-09-21 NOTE — H&P (Signed)
History and Physical    [name] XVQ:[id number] DOB: 06-16-1929 DOA: 09/20/2018  PCP: Isaac Bliss, Rayford Halsted, MD   Patient coming from: Home   Chief Complaint: Lower abdominal pain   HPI: Benjamin Cardenas is a 85 y.o. male with medical history significant for AAA, BPH, and admission last month with UTI and urinary retention, now presenting to the emergency department with lower abdominal pain after his Foley catheter was removed earlier in the day.  Patient was having some mild nonspecific malaise earlier in the day, had his Foley catheter removed by urologist, was drinking a lot of water back at home and attempt to urinate, and went on to develop severe pain in his lower abdomen.  He had not been coughing and has not appeared short of breath.  There has not been any vomiting or diarrhea.  Cultures remain negative from the admission last month.  ED Course: Upon arrival to the ED, patient is found to be febrile to 38.9, slightly tachycardic, and with systolic blood pressure of 91.  Chemistry panel is notable for sodium of 129 and creatinine 1.34, a month ago.  CBC features a leukocytosis to 14,500 and a stable normocytic anemia.  Urinalysis is concerning for infection.  Blood and urine cultures were collected, 30 cc/kg NS bolus was given, the patient was started on empiric Rocephin and hospitalists are asked to admit.   Review of Systems:  All other systems reviewed and apart from HPI, are negative.  Past Medical History:  Diagnosis Date  . Bladder cancer [facility]) urology-- dr Karsten Ro   dx 11/ 2016  Transitional cell carcinoma, High grade invasive , Stage T1  G3 ---  s/p  TURBT and Mitomycin C instillation  . BPH with urinary obstruction   . Dry skin dermatitis   . Elevated PSA   . History of colon polyps   . Hydronephrosis, left   . Lower urinary tract symptoms (LUTS)   . OA (osteoarthritis)   . Prostate nodule   . Seasonal allergic rhinitis   . Wears glasses     Past Surgical  History:  Procedure Laterality Date  . cataract surgery    . CYSTOSCOPY WITH BIOPSY N/A 04/22/2015   Procedure: CYSTOSCOPY WITH BLADDER BIOPSY;  Surgeon: Kathie Rhodes, MD;  Location: [facility];  Service: Urology;  Laterality: N/A;  . CYSTOSCOPY WITH RETROGRADE PYELOGRAM, URETEROSCOPY AND STENT PLACEMENT Left 02/25/2015   Procedure: LEFT RETROGRADE PYELOGRAM, URETEROSCOPY ;  Surgeon: Kathie Rhodes, MD;  Location: [facility];  Service: Urology;  Laterality: Left;  . HERNIA REPAIR  age 8 1937   rupture right inguinal hernia  . INGUINAL HERNIA REPAIR  03/02/2011   Procedure: HERNIA REPAIR INGUINAL ADULT;  Surgeon: Earnstine Regal, MD;  Location: [facility];  Service: General;  Laterality: Left;  with mesh   . TRANSURETHRAL RESECTION OF BLADDER TUMOR WITH GYRUS (TURBT-GYRUS) N/A 02/25/2015   Procedure: TRANSURETHRAL RESECTION OF BLADDER TUMOR WITH GYRUS (TURBT-GYRUS);  Surgeon: Kathie Rhodes, MD;  Location: [facility];  Service: Urology;  Laterality: N/A;     reports that he quit smoking about 39 years ago. His smoking use included cigarettes. He has a 51.00 pack-year smoking history. He has never used smokeless tobacco. He reports current alcohol use. He reports that he does not use drugs.  Allergies  Allergen Reactions  . Amoxicillin Rash    Has patient had a PCN reaction causing immediate rash, facial/tongue/throat swelling, SOB or lightheadedness with hypotension:No Has patient  had a PCN reaction causing severe rash involving mucus membranes or skin necrosis:unsure Has patient had a PCN reaction that required hospitalization:No Has patient had a PCN reaction occurring within the last 10 years:unsure If all of the above answers are "NO", then may proceed with Cephalosporin use.   . Ciprofloxacin Swelling and Rash  . Sulfasalazine Swelling  . Sulfa Antibiotics Swelling  . Doxycycline Hyclate Rash  . Levofloxacin Rash    Family History  Problem  Relation Age of Onset  . Heart disease Mother   . Heart disease Father   . Cancer Sister        breast  . Cancer Brother        colon     Prior to Admission medications   Medication Sig Start Date End Date Taking? Authorizing Provider  alfuzosin (UROXATRAL) 10 MG 24 hr tablet Take 10 mg by mouth daily with breakfast.   Yes [provider]  aspirin EC 81 MG tablet Take 81 mg by mouth daily.    Yes [provider]  cetirizine (ZYRTEC) 10 MG tablet Take 10 mg by mouth daily.    Yes [provider]  finasteride (PROSCAR) 5 MG tablet Take 5 mg by mouth daily.  08/23/18  Yes [provider]  naphazoline-pheniramine (NAPHCON-A) 0.025-0.3 % ophthalmic solution Place 1 drop into both eyes 2 (two) times daily.   Yes [provider]  protein supplement shake (PREMIER PROTEIN) LIQD Take 2 oz by mouth daily as needed (food supplement). Add to PET butter pecan ice cream for calorie/protein supplement.   Yes [provider]  TRULANCE 3 MG TABS Take 3 mg by mouth every morning.  10/29/15  Yes [provider]    Physical Exam: Vitals:   09/20/18 2339 09/21/18 0045 09/21/18 0100 09/21/18 0115  BP: 118/72 104/68 91/76 102/69  Pulse: 95 99 98 98  Resp: (!) 22     Temp:   (!) 102.1 F (38.9 C)   TempSrc:   Rectal   SpO2: 100% 97% 100% 100%    Constitutional: NAD, in apparent discomfort  Eyes: PERTLA, lids and conjunctivae normal ENMT: Mucous membranes are moist. Posterior pharynx clear of any exudate or lesions.   Neck: normal, supple, no masses, no thyromegaly Respiratory: clear to auscultation bilaterally, no wheezing, no crackles. No accessory muscle use.  Cardiovascular: S1 & S2 heard, regular rate and rhythm. No extremity edema.   Abdomen: No distension, soft, suprapubic tenderness. No rebound pain or guarding. Bowel sounds active.  Musculoskeletal: no clubbing / cyanosis. No joint deformity upper and lower extremities.   Skin: no  significant rashes, lesions, ulcers. Warm, dry, well-perfused. Neurologic: CN 2-12 grossly intact. Sensation intact. Moving all extremities.  Psychiatric: Alert and oriented to person, place, and situation. Pleasant and cooperative.    Labs on Admission: I have personally reviewed following labs and imaging studies  CBC: Recent Labs  Lab 09/20/18 2334  WBC 14.5*  NEUTROABS 12.9*  HGB 10.7*  HCT 32.1*  MCV 92.8  PLT 390   Basic Metabolic Panel: Recent Labs  Lab 09/20/18 2334  NA 129*  K 4.7  CL 97*  CO2 21*  GLUCOSE 113*  BUN 26*  CREATININE 1.34*  CALCIUM 9.0   GFR: CrCl cannot be calculated (Unknown ideal weight.). Liver Function Tests: Recent Labs  Lab 09/20/18 2334  AST 22  ALT 13  ALKPHOS 77  BILITOT 1.6*  PROT 6.6  ALBUMIN 3.8   No results for input(s): LIPASE, AMYLASE in  the last 168 hours. No results for input(s): AMMONIA in the last 168 hours. Coagulation Profile: No results for input(s): INR, PROTIME in the last 168 hours. Cardiac Enzymes: No results for input(s): CKTOTAL, CKMB, CKMBINDEX, TROPONINI in the last 168 hours. BNP (last 3 results) No results for input(s): PROBNP in the last 8760 hours. HbA1C: No results for input(s): HGBA1C in the last 72 hours. CBG: No results for input(s): GLUCAP in the last 168 hours. Lipid Profile: No results for input(s): CHOL, HDL, LDLCALC, TRIG, CHOLHDL, LDLDIRECT in the last 72 hours. Thyroid Function Tests: No results for input(s): TSH, T4TOTAL, FREET4, T3FREE, THYROIDAB in the last 72 hours. Anemia Panel: No results for input(s): VITAMINB12, FOLATE, FERRITIN, TIBC, IRON, RETICCTPCT in the last 72 hours. Urine analysis:    Component Value Date/Time   COLORURINE YELLOW 09/21/2018 0027   APPEARANCEUR CLOUDY (A) 09/21/2018 0027   LABSPEC 1.004 (L) 09/21/2018 0027   PHURINE 7.0 09/21/2018 0027   GLUCOSEU NEGATIVE 09/21/2018 0027   HGBUR LARGE (A) 09/21/2018 0027   BILIRUBINUR NEGATIVE 09/21/2018 0027    BILIRUBINUR n 06/26/2016 0810   KETONESUR NEGATIVE 09/21/2018 0027   PROTEINUR 100 (A) 09/21/2018 0027   UROBILINOGEN 0.2 06/26/2016 0810   UROBILINOGEN 0.2 07/11/2012 0200   NITRITE NEGATIVE 09/21/2018 0027   LEUKOCYTESUR LARGE (A) 09/21/2018 0027   Sepsis Labs: @LABRCNTIP (procalcitonin:4,lacticidven:4) )No results found for this or any previous visit (from the past 240 hour(s)).   Radiological Exams on Admission: No results found.  EKG: Ordered, not yet performed.   Assessment/Plan  1. Sepsis secondary to UTI  - Presents with lower abdominal pain and is found to have fever, leukocytosis, tachycardia, and UA concerning for UTI  - Blood and urine cultures collected in ED, 30 cc/kg NS bolus given, and empiric antibiotics started  - Continue empiric antibiotic with cefepime, follow cultures and clinical course   2. Hyponatremia  - Serum sodium is 129 in ED  - Patient appears hypovolemic and wife reports he was drinking a lot of water in attempt to urinate after having Foley removed  - He has received 30 cc/kg NS in ED  - Check urine sodium and urine osm, repeat chem panel in am    3. Mild renal insufficiency  - SCr is 1.34 on admission, up from 1.11 a month ago  - He was given 30 cc/kg NS in ED  - Renally-dose medications, repeat chem panel in am    PPE: Mask, face shield. Patient wearing mask.  DVT prophylaxis: sq heparin  Code Status: Full  Family Communication: Wife updated at bedside  Consults called: None Admission status: observation     Vianne Bulls, MD [facility] Pager [phone]  If 7PM-7AM, please contact night-coverage [email or web address] Password [email or web address]  09/21/2018, 1:47 AM

## 2018-09-21 NOTE — Progress Notes (Signed)
Pharmacy Antibiotic Note  Benjamin Cardenas is a 85 y.o. male admitted on 09/20/2018 with UTI.  Pharmacy has been consulted for cefepime dosing.  Plan: Cefepime 2 gm IV q12 F/u renal fxn and culture data    Temp (24hrs), Avg:101 F (38.3 C), Min:99.6 F (37.6 C), Max:102.1 F (38.9 C)  Recent Labs  Lab 09/20/18 2334  WBC 14.5*  CREATININE 1.34*    CrCl cannot be calculated (Unknown ideal weight.).    Allergies  Allergen Reactions  . Amoxicillin Rash    Has patient had a PCN reaction causing immediate rash, facial/tongue/throat swelling, SOB or lightheadedness with hypotension:No Has patient had a PCN reaction causing severe rash involving mucus membranes or skin necrosis:unsure Has patient had a PCN reaction that required hospitalization:No Has patient had a PCN reaction occurring within the last 10 years:unsure If all of the above answers are "NO", then may proceed with Cephalosporin use.   . Ciprofloxacin Swelling and Rash  . Sulfasalazine Swelling  . Sulfa Antibiotics Swelling  . Doxycycline Hyclate Rash  . Levofloxacin Rash    Antimicrobials this admission: 6/17 CTX x 1 6/17 cefepime>> Dose adjustments this admission:  Microbiology results: 6/17 UCx: sent 6/17 BCx2: sent 6/17 COVID 19: ordered  Thank you for allowing pharmacy to be a part of this patient's care.  Eudelia Bunch, Pharm.D 09/21/2018 1:59 AM

## 2018-09-22 ENCOUNTER — Inpatient Hospital Stay (HOSPITAL_COMMUNITY): Payer: Medicare Other

## 2018-09-22 DIAGNOSIS — R338 Other retention of urine: Secondary | ICD-10-CM

## 2018-09-22 LAB — RETICULOCYTES
Immature Retic Fract: 6.4 % (ref 2.3–15.9)
RBC.: 3.05 MIL/uL — ABNORMAL LOW (ref 4.22–5.81)
Retic Count, Absolute: 23.5 10*3/uL (ref 19.0–186.0)
Retic Ct Pct: 0.8 % (ref 0.4–3.1)

## 2018-09-22 LAB — CBC WITH DIFFERENTIAL/PLATELET
Abs Immature Granulocytes: 0.02 10*3/uL (ref 0.00–0.07)
Basophils Absolute: 0 10*3/uL (ref 0.0–0.1)
Basophils Relative: 0 %
Eosinophils Absolute: 0.2 10*3/uL (ref 0.0–0.5)
Eosinophils Relative: 2 %
HCT: 30 % — ABNORMAL LOW (ref 39.0–52.0)
Hemoglobin: 9.4 g/dL — ABNORMAL LOW (ref 13.0–17.0)
Immature Granulocytes: 0 %
Lymphocytes Relative: 21 %
Lymphs Abs: 1.7 10*3/uL (ref 0.7–4.0)
MCH: 30.8 pg (ref 26.0–34.0)
MCHC: 31.3 g/dL (ref 30.0–36.0)
MCV: 98.4 fL (ref 80.0–100.0)
Monocytes Absolute: 0.4 10*3/uL (ref 0.1–1.0)
Monocytes Relative: 5 %
Neutro Abs: 5.5 10*3/uL (ref 1.7–7.7)
Neutrophils Relative %: 72 %
Platelets: 155 10*3/uL (ref 150–400)
RBC: 3.05 MIL/uL — ABNORMAL LOW (ref 4.22–5.81)
RDW: 13.9 % (ref 11.5–15.5)
WBC: 7.8 10*3/uL (ref 4.0–10.5)
nRBC: 0 % (ref 0.0–0.2)

## 2018-09-22 LAB — COMPREHENSIVE METABOLIC PANEL
ALT: 11 U/L (ref 0–44)
AST: 15 U/L (ref 15–41)
Albumin: 3 g/dL — ABNORMAL LOW (ref 3.5–5.0)
Alkaline Phosphatase: 54 U/L (ref 38–126)
Anion gap: 6 (ref 5–15)
BUN: 23 mg/dL (ref 8–23)
CO2: 21 mmol/L — ABNORMAL LOW (ref 22–32)
Calcium: 8.6 mg/dL — ABNORMAL LOW (ref 8.9–10.3)
Chloride: 113 mmol/L — ABNORMAL HIGH (ref 98–111)
Creatinine, Ser: 1.11 mg/dL (ref 0.61–1.24)
GFR calc Af Amer: >60 mL/min (ref >60)
GFR calc non Af Amer: 59 mL/min — ABNORMAL LOW (ref >60)
Glucose, Bld: 91 mg/dL (ref 70–99)
Potassium: 3.9 mmol/L (ref 3.5–5.1)
Sodium: 140 mmol/L (ref 135–145)
Total Bilirubin: 0.7 mg/dL (ref 0.3–1.2)
Total Protein: 5.4 g/dL — ABNORMAL LOW (ref 6.5–8.1)

## 2018-09-22 LAB — URINE CULTURE

## 2018-09-22 LAB — TSH: TSH: 6.494 u[IU]/mL — ABNORMAL HIGH (ref 0.350–4.500)

## 2018-09-22 LAB — PHOSPHORUS: Phosphorus: 3.5 mg/dL (ref 2.5–4.6)

## 2018-09-22 LAB — HEMOGLOBIN A1C
Hgb A1c MFr Bld: 5.5 % (ref 4.8–5.6)
Mean Plasma Glucose: 111.15 mg/dL

## 2018-09-22 LAB — FERRITIN: Ferritin: 152 ng/mL (ref 24–336)

## 2018-09-22 LAB — T4, FREE: Free T4: 1.03 ng/dL (ref 0.61–1.12)

## 2018-09-22 LAB — MAGNESIUM: Magnesium: 2.4 mg/dL (ref 1.7–2.4)

## 2018-09-22 LAB — IRON AND TIBC
Iron: 30 ug/dL — ABNORMAL LOW (ref 45–182)
Saturation Ratios: 15 % — ABNORMAL LOW (ref 17.9–39.5)
TIBC: 207 ug/dL — ABNORMAL LOW (ref 250–450)
UIBC: 177 ug/dL

## 2018-09-22 LAB — FOLATE: Folate: 8 ng/mL (ref >5.9)

## 2018-09-22 LAB — VITAMIN B12: Vitamin B-12: 199 pg/mL (ref 180–914)

## 2018-09-22 MED ORDER — VITAMIN B-12 1000 MCG PO TABS
1000.0000 ug | ORAL_TABLET | Freq: Every day | ORAL | Status: DC
  Administered 2018-09-22 – 2018-09-23 (×2): 1000 ug via ORAL
  Filled 2018-09-22 (×3): qty 1

## 2018-09-22 MED ORDER — POLYSACCHARIDE IRON COMPLEX 150 MG PO CAPS
150.0000 mg | ORAL_CAPSULE | Freq: Every day | ORAL | Status: DC
  Administered 2018-09-22 – 2018-09-23 (×2): 150 mg via ORAL
  Filled 2018-09-22 (×2): qty 1

## 2018-09-22 MED ORDER — SODIUM CHLORIDE 0.9 % IV SOLN
INTRAVENOUS | Status: AC
  Administered 2018-09-22 – 2018-09-23 (×2): via INTRAVENOUS

## 2018-09-22 MED ORDER — SODIUM CHLORIDE 0.9 % IV BOLUS
500.0000 mL | Freq: Once | INTRAVENOUS | Status: AC
  Administered 2018-09-22: 10:00:00 via INTRAVENOUS

## 2018-09-22 NOTE — Evaluation (Signed)
Physical Therapy Evaluation Patient Details Name: Benjamin Cardenas MRN: [id number] DOB: 1930/02/23 Today's Date: 09/22/2018   History of Present Illness  The patient is an 85 year old with a past medical history significant for but not limited to AAA, BPH with urinary obstruction, history of bladder cancer, history of hydronephrosis on the left side, osteoarthritis, seasonal allergic rhinitis as well as other comorbidities who recently was admitted last month with UTI and urinary retention now presented to the ED lower abdominal pain after his Foley catheter removed earlier in the day.  Clinical Impression  Pt presents with some dependencies in mobility affecting his independence secondary to the above diagnosis. Pt did also demonstrate some word finding difficulty. Pt reported dizziness with position changes and head turns. Pt will benefit from acute skilled PT to maximize mobility and improve independence until d/c home with wife providing supervision as needed.     Follow Up Recommendations Home health PT    Equipment Recommendations  None recommended by PT    Recommendations for Other Services       Precautions / Restrictions Precautions Precautions: Fall Restrictions Weight Bearing Restrictions: No      Mobility  Bed Mobility Overal bed mobility: Needs Assistance Bed Mobility: Supine to Sit;Sit to Supine     Supine to sit: Supervision Sit to supine: Supervision   General bed mobility comments: due to iv and foley  Transfers Overall transfer level: Needs assistance Equipment used: 4-wheeled walker Transfers: Sit to/from Stand Sit to Stand: Min guard         General transfer comment: cues for hand placement for increased pt safety  Ambulation/Gait Ambulation/Gait assistance: Supervision Gait Distance (Feet): 400 Feet Assistive device: 4-wheeled walker Gait Pattern/deviations: Step-through pattern;Trunk flexed Gait velocity: decreased   General Gait Details:  cues to stay closer to RW, pt c/o some dizziness with head turns. Symptoms resolved while focusing on an object and minimizing head turns.  Stairs            Wheelchair Mobility    Modified Rankin (Stroke Patients Only)       Balance Overall balance assessment: Mild deficits observed, not formally tested                                           Pertinent Vitals/Pain Pain Assessment: No/denies pain    Home Living Family/patient expects to be discharged to:: Private residence Living Arrangements: Spouse/significant other Available Help at Discharge: Family;Available 24 hours/day Type of Home: House Home Access: Stairs to enter Entrance Stairs-Rails: Right Entrance Stairs-Number of Steps: 4 Home Layout: Two level;Able to live on main level with bedroom/bathroom Home Equipment: Gilford Rile - 4 wheels      Prior Function Level of Independence: Independent with assistive device(s)               Hand Dominance        Extremity/Trunk Assessment   Upper Extremity Assessment Upper Extremity Assessment: Defer to OT evaluation    Lower Extremity Assessment Lower Extremity Assessment: Overall WFL for tasks assessed       Communication   Communication: No difficulties  Cognition Arousal/Alertness: Awake/alert   Overall Cognitive Status: No family/caregiver present to determine baseline cognitive functioning                                 General  Comments: wording finding difficulty,      General Comments      Exercises     Assessment/Plan    PT Assessment Patient needs continued PT services  PT Problem List Decreased mobility;Decreased safety awareness;Decreased activity tolerance;Decreased balance;Decreased strength       PT Treatment Interventions DME instruction;Therapeutic activities;Gait training;Therapeutic exercise;Patient/family education;Balance training;Stair training;Functional mobility training;Neuromuscular  re-education    PT Goals (Current goals can be found in the Care Plan section)  Acute Rehab PT Goals Patient Stated Goal: To walk and get stronger PT Goal Formulation: With patient Time For Goal Achievement: 10/06/18 Potential to Achieve Goals: Good    Frequency Min 3X/week   Barriers to discharge        Co-evaluation               AM-PAC PT "6 Clicks" Mobility  Outcome Measure Help needed turning from your back to your side while in a flat bed without using bedrails?: A Little Help needed moving from lying on your back to sitting on the side of a flat bed without using bedrails?: A Little Help needed moving to and from a bed to a chair (including a wheelchair)?: A Little Help needed standing up from a chair using your arms (e.g., wheelchair or bedside chair)?: A Little Help needed to walk in hospital room?: A Little Help needed climbing 3-5 steps with a railing? : A Little 6 Click Score: 18    End of Session Equipment Utilized During Treatment: Gait belt Activity Tolerance: Patient tolerated treatment well Patient left: in bed;with call bell/phone within reach;with bed alarm set Nurse Communication: Mobility status PT Visit Diagnosis: Difficulty in walking, not elsewhere classified (R26.2)    Time: 4709-2957 PT Time Calculation (min) (ACUTE ONLY): 28 min   Charges:   PT Evaluation $PT Eval Low Complexity: 1 Low PT Treatments $Gait Training: 8-22 mins        Theodoro Grist, PT  Lelon Mast 09/22/2018, 2:45 PM

## 2018-09-22 NOTE — Progress Notes (Signed)
PROGRESS NOTE    Benjamin Cardenas  HGD:[id number] DOB: [birth year] DOA: 09/20/2018 PCP: Isaac Bliss, Rayford [place], MD   Brief Narrative:  The patient is an 85 year old with a past medical history significant for but not limited to AAA, BPH with urinary obstruction, history of bladder cancer, history of hydronephrosis on the left side, osteoarthritis, seasonal allergic rhinitis as well as other comorbidities who recently was admitted last month with UTI and urinary retention now presented to the ED lower abdominal pain after his Foley catheter removed earlier in the day.  He is also having some mild nonspecific malaise in the day and Foley catheter removed by urologist and patient has been home drink a lot of water and attempt to urinate and went on to develop severe pain in the abdomen.  In the ED is found to be febrile, slightly tachycardic with a systolic blood pressure of 91 and CMP showed a sodium of 129 and creatinine 1.34.  He had a mild leukocytosis of 14,500 and stable normocytic anemia.  Urinalysis was concerning for infection and blood and urine cultures were collected and patient was started on sepsis protocol with 30 cc/kg and then placed on empiric antibiotics with IV Rocephin.  Patient's sodium as well as his renal function is improved and WBC is trending downwards.    **Interim History Patient's numbers are improving and Foley catheter draining well however patient remained slightly dizzy today.  Will work-up dizziness as patient felt dizzy upon standing.  Urine culture grew out multiple species so we will need to recollect and sent off for another urine sample.  Assessment & Plan:   Principal Problem:   Sepsis secondary to UTI [facility]) Active Problems:   Hyponatremia   Mild renal insufficiency  Sepsis secondary to UTI -Presented with lower abdominal pain and is found to have fever, leukocytosis, tachycardia, and UA concerning for UTI -Urinalysis was cloudy appearance, large  hemoglobin, large leukocytes, 100 protein, rare bacteria, greater than 50 WBCs, greater than 50 RBCs per high-power field -Blood and urine cultures collected in ED, 30 cc/kg NS bolus given, and empiric antibiotics started -Continue empiric antibiotic with IV Cefepime, follow cultures and clinical course -Urine Cx showed Multiple Species present so will recollect -Blood Cx x2 Pending  -Foley Catheter was replaced -Sepsis Physiology is improving   Hyponatremia, improved  -Serum sodium is 129 in [place] now is 140 -Patient appeared hypovolemic and wife reports he was drinking a lot of water in attempt to urinate after having Foley removed -Urine Osmolality was 162 and urine sodium was 27 -He has received 30 cc/kg NS in ED; C/w Maintenance IVF as below and given a Bolus this AM  -Repeat chem panel in am  Dizziness -Possibly in the setting of hypotension but will rule out other etiologies; specifically patient states that he is dizzy upon standing -We will check orthostatic vital signs -TSH was 6.494 however free T4 was normal at 1.03 -Check head CT without contrast -We will add TED hose for orthostasis and give 500 mL bolus now and continue maintenance IV fluids at 75 mL's per hour -We will consider stopping alfuzosin if patient consistently remains dizzy -May try a trial of meclizine  Mild renal insufficiency, improving  -SCr is 1.34 on admission, up from 1.11 a month ago; Now BUN/Cr is 23/1.11 -He was given 30 cc/kg NS in ED;  -Given a Bolus of IVF yesterday and will give 500 mL bolus today  -C/w Maintenance IVF with NS at 75 mL/hr x 24  more hours  -Renally-dose medications, repeat chem panel in am  Leukocytosis -Trending down. WBC went from 14.5 -> 12.9 -> 7.8 -In the setting of Infection -Continue to Monitor and Follow Cx's -Repeat CBC in AM  Normocytic Anemia -Patient's Hgb/Hct went from 10.7/32.1 -> 10.0/30.9 -> 9.4/30.0 -Checked Anemia Panel showed iron level of 30,  U IBC 177, TIBC of 207, saturation of 15%, ferritin level 152, folate level of 8.0, and vitamin B12 level of 199 -We will start iron supplementation with Iron polysaccharided 150 mg po Daily and Vitamin B12 Supplementation with 1,000 mcg po Daily  -Continue to Monitor for S/Sx of Bleeding; Currently no overt Bleeding noted -Repeat CBC in AM    Hyperbilirubinemia -Likely Reactive and Trending down -T Bili went from 1.6 -> 0.8 -> 0.7 -Continue to Monitor and Trend and Repeat CMP in AM   Metabolic Acidosis, improved  -Mild as CO2 was 19. AG was 10; Now CO2 was 21 and AG is 6 -C/w IVF as above -Continue to Monitor and Trend -Repeat CMP in AM  Hyperglycemia -Likely Reactive; Blood Glucose ranging from 91-124 on daily CMP's  -Checked HbA1c to r/o Diabetes and was 5.5  -Continue To monitor blood sugars carefully and if consistently remains elevated will need to place on sensitive NovoLog sliding scale insulin AC  Acute Urinary Retention -Continue with prostate medications -Foley catheter has been replaced -We will obtain Renal U/S  History of bladder cancer with TURBT and mitomycin C instillation along with a history of BPH and urinary obstruction -Will continue alfuzosin 10 mg p.o. daily along with finasteride 5 mg p.o. daily -Follows with Dr. Karsten Ro of Urology   DVT prophylaxis: Heparin 5,000 units sq q8h Code Status: FULL CODE  Family Communication: No family present at bedside Disposition Plan: Remain Inpatient for continued Workup and Treatment  Consultants:   None   Procedures:  None  Antimicrobials:  Anti-infectives (From admission, onward)   Start     Dose/Rate Route Frequency Ordered Stop   09/21/18 0600  ceFEPIme (MAXIPIME) 2 g in sodium chloride 0.9 % 100 mL IVPB     2 g 200 mL/hr over 30 Minutes Intravenous Every 12 hours 09/21/18 0157     09/21/18 0030  cefTRIAXone (ROCEPHIN) 1 g in sodium chloride 0.9 % 100 mL IVPB     1 g 200 mL/hr over 30 Minutes  Intravenous  Once 09/21/18 0017 09/21/18 0145     Subjective: Seen and examined at bedside and states that he was very dizzy upon standing especially when trying to ambulate.  No nausea or vomiting.  Denies chest pain, lightheadedness or dizziness but was concerned about his weakness and his dizziness specifically.  No other concerns or complaints at this time  Objective: Vitals:   09/22/18 0439 09/22/18 1059 09/22/18 1103 09/22/18 1106  BP: 104/62 115/70 118/66 111/67  Pulse: (!) 56 70 78 75  Resp: 16     Temp: 97.9 F (36.6 C)     TempSrc: Oral     SpO2: 96% 100%    Weight:      Height:        Intake/Output Summary (Last 24 hours) at 09/22/2018 1125 Last data filed at 09/22/2018 1114 Gross per 24 hour  Intake 2335.88 ml  Output 4775 ml  Net -2439.12 ml   Filed Weights   09/21/18 0432  Weight: 65.7 kg   Examination: Physical Exam:  Constitutional:  Thin elderly Caucasian male in NAD and appears calm and comfortable Eyes: Lids  and conjunctivae normal, sclerae anicteric  ENMT: External Ears, Nose appear normal. Slightly hard of hearing.  Neck: Appears normal, supple, no cervical masses, normal ROM, no appreciable thyromegaly; no JVD Respiratory: Diminished to auscultation bilaterally, no wheezing, rales, rhonchi or crackles. Normal respiratory effort and patient is not tachypenic. No accessory muscle use.  Cardiovascular: RRR, no murmurs / rubs / gallops. S1 and S2 auscultated. No extremity edema..  Abdomen: Soft, non-tender, non-distended. No masses palpated. No appreciable hepatosplenomegaly. Bowel sounds positive x4.  GU: Deferred. Foley catheter in place draining clear yellow urine  Musculoskeletal: No clubbing / cyanosis of digits/nails. No joint deformity upper and lower extremities. Skin: No rashes, lesions, ulcers on a limited skin evaluation. No induration; Warm and dry.  Neurologic: CN 2-12 grossly intact with no focal deficits. Romberg sign cerebellar reflexes not  assessed.  Psychiatric: Normal judgment and insight. Alert and oriented x 3. Slightly Anxious mood and appropriate affect.   Data Reviewed: I have personally reviewed following labs and imaging studies  CBC: Recent Labs  Lab 09/20/18 2334 09/21/18 0518 09/22/18 0502  WBC 14.5* 12.9* 7.8  NEUTROABS 12.9* 11.5* 5.5  HGB 10.7* 10.0* 9.4*  HCT 32.1* 30.9* 30.0*  MCV 92.8 94.5 98.4  PLT 186 168 081   Basic Metabolic Panel: Recent Labs  Lab 09/20/18 2334 09/21/18 0518 09/22/18 0502  NA 129* 136 140  K 4.7 4.1 3.9  CL 97* 107 113*  CO2 21* 19* 21*  GLUCOSE 113* 124* 91  BUN 26* 21 23  CREATININE 1.34* 1.20 1.11  CALCIUM 9.0 8.1* 8.6*  MG  --   --  2.4  PHOS  --   --  3.5   GFR: Estimated Creatinine Clearance: 43.6 mL/min (by C-G formula based on SCr of 1.11 mg/dL). Liver Function Tests: Recent Labs  Lab 09/20/18 2334 09/21/18 0518 09/22/18 0502  AST 22 18 15   ALT 13 11 11   ALKPHOS 77 58 54  BILITOT 1.6* 0.8 0.7  PROT 6.6 5.0* 5.4*  ALBUMIN 3.8 2.8* 3.0*   No results for input(s): LIPASE, AMYLASE in the last 168 hours. No results for input(s): AMMONIA in the last 168 hours. Coagulation Profile: No results for input(s): INR, PROTIME in the last 168 hours. Cardiac Enzymes: No results for input(s): CKTOTAL, CKMB, CKMBINDEX, TROPONINI in the last 168 hours. BNP (last 3 results) No results for input(s): PROBNP in the last 8760 hours. HbA1C: Recent Labs    09/22/18 0502  HGBA1C 5.5   CBG: No results for input(s): GLUCAP in the last 168 hours. Lipid Profile: No results for input(s): CHOL, HDL, LDLCALC, TRIG, CHOLHDL, LDLDIRECT in the last 72 hours. Thyroid Function Tests: Recent Labs    09/22/18 0502  TSH 6.494*  FREET4 1.03   Anemia Panel: Recent Labs    09/22/18 0502  VITAMINB12 199  FOLATE 8.0  FERRITIN 152  TIBC 207*  IRON 30*  RETICCTPCT 0.8   Sepsis Labs: Recent Labs  Lab 09/21/18 0235 09/21/18 0518  LATICACIDVEN 0.9 0.7    Recent  Results (from the past 240 hour(s))  Urine Culture     Status: Abnormal   Collection Time: 09/21/18 12:27 AM   Specimen: Urine, Random  Result Value Ref Range Status   Specimen Description   Final    URINE, RANDOM Performed at [facility], [place] [address], [place],  [address]    Special Requests   Final    NONE Performed at [facility], [address]  [place], [place] [address]    Culture MULTIPLE SPECIES PRESENT, SUGGEST RECOLLECTION (A)  Final   Report Status 09/22/2018 FINAL  Final  SARS Coronavirus 2 (CEPHEID- Performed in [place] hospital lab), Hosp Order     Status: None   Collection Time: 09/21/18  1:26 AM   Specimen: Nasopharyngeal Swab  Result Value Ref Range Status   SARS Coronavirus 2 NEGATIVE NEGATIVE Final    Comment: (NOTE) If result is NEGATIVE SARS-CoV-2 target nucleic acids are NOT DETECTED. The SARS-CoV-2 RNA is generally detectable in upper and lower  respiratory specimens during the acute phase of infection. The lowest  concentration of SARS-CoV-2 viral copies this assay can detect is 250  copies / mL. A negative result does not preclude SARS-CoV-2 infection  and should not be used as the sole basis for treatment or other  patient management decisions.  A negative result may occur with  improper specimen collection / handling, submission of specimen other  than nasopharyngeal swab, presence of viral mutation(s) within the  areas targeted by this assay, and inadequate number of viral copies  (<250 copies / mL). A negative result must be combined with clinical  observations, patient history, and epidemiological information. If result is POSITIVE SARS-CoV-2 target nucleic acids are DETECTED. The SARS-CoV-2 RNA is generally detectable in upper and lower  respiratory specimens dur ing the acute phase of infection.  Positive  results are indicative of active infection with SARS-CoV-2.  Clinical  correlation with patient  history and other diagnostic information is  necessary to determine patient infection status.  Positive results do  not rule out bacterial infection or co-infection with other viruses. If result is PRESUMPTIVE POSTIVE SARS-CoV-2 nucleic acids MAY BE PRESENT.   A presumptive positive result was obtained on the submitted specimen  and confirmed on repeat testing.  While 2019 novel coronavirus  (SARS-CoV-2) nucleic acids may be present in the submitted sample  additional confirmatory testing may be necessary for epidemiological  and / or clinical management purposes  to differentiate between  SARS-CoV-2 and other Sarbecovirus currently known to infect humans.  If clinically indicated additional testing with an alternate test  methodology [id number]) is advised. The SARS-CoV-2 RNA is generally  detectable in upper and lower respiratory sp ecimens during the acute  phase of infection. The expected result is Negative. Fact Sheet for Patients:  StrictlyIdeas.no Fact Sheet for Healthcare Providers: BankingDealers.co.za This test is not yet approved or cleared by the [place] FDA and has been authorized for detection and/or diagnosis of SARS-CoV-2 by FDA under an Emergency Use Authorization (EUA).  This EUA will remain in effect (meaning this test can be used) for the duration of the COVID-19 declaration under Section 564(b)(1) of the Act, 21 U.S.C. section 360bbb-3(b)(1), unless the authorization is terminated or revoked sooner. Performed at [facility], [place] [address] [place], [place] [address]     Radiology Studies: No results found.  Scheduled Meds: . alfuzosin  10 mg Oral Q breakfast  . aspirin EC  81 mg Oral Daily  . finasteride  5 mg Oral Daily  . heparin  5,000 Units Subcutaneous Q8H  . iron polysaccharides  150 mg Oral Daily  . sodium chloride flush  3 mL Intravenous Q12H   Continuous Infusions: . sodium  chloride    . sodium chloride    . ceFEPime (MAXIPIME) IV 2 g (09/22/18 0527)    LOS: 1 day   Kerney Elbe, DO [facility] PAGER is on [employer]  If 7PM-7AM, please contact night-coverage [email or web address] Password [facility] 09/22/2018, 11:25 AM

## 2018-09-23 ENCOUNTER — Telehealth: Payer: Self-pay | Admitting: *Deleted

## 2018-09-23 LAB — CBC WITH DIFFERENTIAL/PLATELET
Abs Immature Granulocytes: 0.02 10*3/uL (ref 0.00–0.07)
Basophils Absolute: 0 10*3/uL (ref 0.0–0.1)
Basophils Relative: 0 %
Eosinophils Absolute: 0.3 10*3/uL (ref 0.0–0.5)
Eosinophils Relative: 4 %
HCT: 32.7 % — ABNORMAL LOW (ref 39.0–52.0)
Hemoglobin: 10.7 g/dL — ABNORMAL LOW (ref 13.0–17.0)
Immature Granulocytes: 0 %
Lymphocytes Relative: 22 %
Lymphs Abs: 1.5 10*3/uL (ref 0.7–4.0)
MCH: 31.3 pg (ref 26.0–34.0)
MCHC: 32.7 g/dL (ref 30.0–36.0)
MCV: 95.6 fL (ref 80.0–100.0)
Monocytes Absolute: 0.4 10*3/uL (ref 0.1–1.0)
Monocytes Relative: 6 %
Neutro Abs: 4.6 10*3/uL (ref 1.7–7.7)
Neutrophils Relative %: 68 %
Platelets: 165 10*3/uL (ref 150–400)
RBC: 3.42 MIL/uL — ABNORMAL LOW (ref 4.22–5.81)
RDW: 13.5 % (ref 11.5–15.5)
WBC: 6.9 10*3/uL (ref 4.0–10.5)
nRBC: 0 % (ref 0.0–0.2)

## 2018-09-23 LAB — COMPREHENSIVE METABOLIC PANEL
ALT: 11 U/L (ref 0–44)
AST: 17 U/L (ref 15–41)
Albumin: 2.9 g/dL — ABNORMAL LOW (ref 3.5–5.0)
Alkaline Phosphatase: 53 U/L (ref 38–126)
Anion gap: 7 (ref 5–15)
BUN: 17 mg/dL (ref 8–23)
CO2: 22 mmol/L (ref 22–32)
Calcium: 9 mg/dL (ref 8.9–10.3)
Chloride: 111 mmol/L (ref 98–111)
Creatinine, Ser: 1.03 mg/dL (ref 0.61–1.24)
GFR calc Af Amer: >60 mL/min (ref >60)
GFR calc non Af Amer: >60 mL/min (ref >60)
Glucose, Bld: 91 mg/dL (ref 70–99)
Potassium: 3.5 mmol/L (ref 3.5–5.1)
Sodium: 140 mmol/L (ref 135–145)
Total Bilirubin: 0.5 mg/dL (ref 0.3–1.2)
Total Protein: 5.6 g/dL — ABNORMAL LOW (ref 6.5–8.1)

## 2018-09-23 LAB — PHOSPHORUS: Phosphorus: 3.1 mg/dL (ref 2.5–4.6)

## 2018-09-23 LAB — URINE CULTURE: Culture: NO GROWTH

## 2018-09-23 LAB — MAGNESIUM: Magnesium: 1.9 mg/dL (ref 1.7–2.4)

## 2018-09-23 MED ORDER — CEFDINIR 300 MG PO CAPS
300.0000 mg | ORAL_CAPSULE | Freq: Two times a day (BID) | ORAL | Status: DC
  Administered 2018-09-23: 300 mg via ORAL
  Filled 2018-09-23: qty 1

## 2018-09-23 MED ORDER — POTASSIUM CHLORIDE CRYS ER 20 MEQ PO TBCR
40.0000 meq | EXTENDED_RELEASE_TABLET | Freq: Two times a day (BID) | ORAL | Status: DC
  Administered 2018-09-23: 40 meq via ORAL
  Filled 2018-09-23: qty 2

## 2018-09-23 MED ORDER — CYANOCOBALAMIN 1000 MCG PO TABS: 1000.0000 ug | ORAL_TABLET | Freq: Every day | ORAL | 0 refills | Status: DC

## 2018-09-23 MED ORDER — POLYSACCHARIDE IRON COMPLEX 150 MG PO CAPS: 150.0000 mg | ORAL_CAPSULE | Freq: Every day | ORAL | 0 refills | Status: DC

## 2018-09-23 MED ORDER — POLYETHYLENE GLYCOL 3350 17 G PO PACK
17.0000 g | PACK | Freq: Two times a day (BID) | ORAL | Status: DC
  Administered 2018-09-23: 17 g via ORAL
  Filled 2018-09-23: qty 1

## 2018-09-23 MED ORDER — PLECANATIDE 3 MG PO TABS: 3.0000 mg | ORAL_TABLET | Freq: Every day | ORAL | Status: DC

## 2018-09-23 MED ORDER — CEFDINIR 300 MG PO CAPS: 300.0000 mg | ORAL_CAPSULE | Freq: Two times a day (BID) | ORAL | 0 refills | Status: AC

## 2018-09-23 MED ORDER — ONDANSETRON HCL 4 MG PO TABS: 4.0000 mg | ORAL_TABLET | Freq: Four times a day (QID) | ORAL | 0 refills | Status: DC | PRN

## 2018-09-23 NOTE — Discharge Summary (Signed)
Physician Discharge Summary  Haddon Fyfe ESP:[id number] DOB: Apr 14, 1929 DOA: 09/20/2018  PCP: Isaac Bliss, Rayford Halsted, MD  Admit date: 09/20/2018 Discharge date: 09/23/2018  Admitted From: Home Disposition: Home with Home Health PT/OT  Recommendations for Outpatient Follow-up:  1. Follow up with PCP in 1-2 weeks 2. Follow up with Urology Dr. Karsten Ro within 1-2 weeks 3. Please obtain BMP/CBC in one week 4. Please follow up on the following pending results:  Home Health: Yes Equipment/Devices: None recommended by PT/OT   Discharge Condition: Stable  CODE STATUS: FULL CODE Diet recommendation: Heart Healthy Diet   Brief/Interim Summary: The patient is an 85 year old with a past medical history significant for but not limited to AAA, BPH with urinary obstruction, history of bladder cancer, history of hydronephrosis on the left side, osteoarthritis, seasonal allergic rhinitis as well as other comorbidities who recently was admitted last month with UTI and urinary retention now presented to the ED lower abdominal pain after his Foley catheter removed earlier in the day. He is also having some mild nonspecific malaise in the day and Foley catheter removed by urologist and patient has been home drink a lot of water and attempt to urinate and went on to develop severe pain in the abdomen.In the ED is found to be febrile, slightly tachycardic with a systolic blood pressure of 91 and CMP showed a sodium of 129 and creatinine 1.34. He had a mild leukocytosis of 14,500 and stable normocytic anemia. Urinalysis was concerning for infection and blood and urine cultures were collected and patient was started on sepsis protocol with 30 cc/kg and then placed on empiric antibiotics with IV Rocephin.Patient's sodium as well as his renal function is improved and WBC is trending downwards.  **Interim History Patient's Labs are improving and Foley catheter draining well however patient remained  slightly dizzy yesterday.  Head CT done was normal.  Patient was likely orthostatic and dizziness improved after fluid hydration.  Urine culture grew out any pain but will empirically treat with p.o. cefdinir for 7 days and patient is a urology appointment Arty scheduled Dr. Karsten Ro.  He will also need to follow-up with PCP within 1 to 2 weeks.  Discharge Diagnoses:  Principal Problem:   Sepsis secondary to UTI [facility]) Active Problems:   Hyponatremia   Mild renal insufficiency  Sepsis secondary to UTI, improved -Presented with lower abdominal pain and is found to have fever, leukocytosis, tachycardia, and UA concerning for UTI -Urinalysis was cloudy appearance, large hemoglobin, large leukocytes, 100 protein, rare bacteria, greater than 50 WBCs, greater than 50 RBCs per high-power field -Blood and urine cultures collected in ED, 30 cc/kg NS bolus given, and empiric antibiotics started -Continued empiric antibiotic withIV Cefepime and changed to p.o. cefdinir discharge, follow cultures and clinical course -Urine Cx showed Multiple Species present so will recollect and repeat showed no growth -Blood Cx x2 showed no growth today and 2 days -Foley Catheter was replaced and will need to follow-up with urology in the outpatient setting -Sepsis Physiology is improving  -Follow-up with urology as well as PCP within 1 week  Hyponatremia, improved -Serum sodium is 129 in [place] now is 140 -Patient appeared hypovolemic and wife reports he was drinking a lot of water in attempt to urinate after having Foley removed -Urine Osmolality was 162 and urine sodium was 27 -He has received 30 cc/kg NS in ED; C/w Maintenance IVF and stopped prior to D/C -Repeat chem panel as an outpatient   Dizziness -Likely in the setting of  hypotension  And orthostasis but will rule out other etiologies; specifically patient states that he is dizzy upon standing -We will check orthostatic vital signs and was no longer  orthostatic -TSH was 6.494 however free T4 was normal at 1.03 -Check head CT without contrast and showed "No acute intracranial abnormality. Significant volume loss with associated chronic microvascular ischemic changes." -We will add TED hose for orthostasis and give 500 mL bolus now and continue maintenance IV fluids at 75 mL's per hour -We will consider stopping alfuzosin if patient consistently remains dizzy -May try a trial of meclizine  Mild renal insufficiency, improving -SCr is 1.34 on admission, up from 1.11 a month ago; Now BUN/Cr is 23/1.11 -> 17/1.03 -He was given 30 cc/kg NS in ED;  -Given a Bolus of IVF yesterday and will give 500 mL bolus today  -C/w Maintenance IVF with NS at 75 mL/hr x 24 more hours -Renally-dose medications, repeat chem panel in am  Leukocytosis -Trending down. WBC went from 14.5 -> 12.9 -> 7.8 -> 6.8 -In the setting of Infection -Continue to Monitor and Follow Cx's -Repeat CBC in AM  Normocytic Anemia -Patient's Hgb/Hct went from 10.7/32.1 -> 10.0/30.9 -> 9.4/30.0 -> 10.7/32.7 -Checked Anemia Panel showed iron level of 30, U IBC 177, TIBC of 207, saturation of 15%, ferritin level 152, folate level of 8.0, and vitamin B12 level of 199 -We will start iron supplementation with Iron polysaccharided 150 mg po Daily and Vitamin B12 Supplementation with 1,000 mcg po Daily  -Continue to Monitor for S/Sx of Bleeding; Currently no overt Bleeding noted -Repeat CBC in AM   Hyperbilirubinemia -Likely Reactive and Trending down -T Bili went from 1.6 -> 0.8 -> 0.7 -> 0.5 -Continue to Monitor and Trend and Repeat CMP in AM  Metabolic Acidosis, improved  -Mild as CO2 was 19. AG was 10; Now CO2 was 22 and AG is 7 -C/w IVF as above -Continue to Monitor and Trend -Repeat CMP in AM  Hyperglycemia -Likely Reactive; Blood Glucose ranging from 91-124 on daily CMP's  -Checked HbA1c to r/o Diabetes and was 5.5  -ContinueTo monitor blood sugars carefully  and if consistently remains elevated will need to place on sensitive NovoLog sliding scale insulin AC  Acute Urinary Retention -Continue with prostate medications -Foley catheter has been replaced -Obtained Renal U/S and showed "Limited study secondary to patient condition. Persistent mild bilateral hydronephrosis, likely significantly improved from CT dated Aug 13, 2018 given differences in technique. Bladder not well evaluated secondary to the presence of a Foley catheter. The bladder appears decompressed."  History of bladder cancer with TURBT and mitomycin C instillation along with a history of BPH and urinary obstruction -Will continue Alfuzosin 10 mg p.o. daily along with finasteride 5 mg p.o. daily -Follows with Dr. Karsten Ro of Urology   Discharge Instructions  Discharge Instructions    Call MD for:  difficulty breathing, headache or visual disturbances   Complete by: As directed    Call MD for:  extreme fatigue   Complete by: As directed    Call MD for:  hives   Complete by: As directed    Call MD for:  persistant dizziness or light-headedness   Complete by: As directed    Call MD for:  persistant nausea and vomiting   Complete by: As directed    Call MD for:  redness, tenderness, or signs of infection (pain, swelling, redness, odor or green/yellow discharge around incision site)   Complete by: As directed  Call MD for:  severe uncontrolled pain   Complete by: As directed    Call MD for:  temperature >100.4   Complete by: As directed    Diet - low sodium heart healthy   Complete by: As directed    Discharge instructions   Complete by: As directed    You were cared for by a hospitalist during your hospital stay. If you have any questions about your discharge medications or the care you received while you were in the hospital after you are discharged, you can call the unit and ask to speak with the hospitalist on call if the hospitalist that took care of you is not  available. Once you are discharged, your primary care physician will handle any further medical issues. Please note that NO REFILLS for any discharge medications will be authorized once you are discharged, as it is imperative that you return to your primary care physician (or establish a relationship with a primary care physician if you do not have one) for your aftercare needs so that they can reassess your need for medications and monitor your lab values.  Follow up with PCP and Urology. Take all medications as prescribed. If symptoms change or worsen please return to the ED for evaluation   Increase activity slowly   Complete by: As directed      Allergies as of 09/23/2018      Reactions   Amoxicillin Rash   Has patient had a PCN reaction causing immediate rash, facial/tongue/throat swelling, SOB or lightheadedness with hypotension:No Has patient had a PCN reaction causing severe rash involving mucus membranes or skin necrosis:unsure Has patient had a PCN reaction that required hospitalization:No Has patient had a PCN reaction occurring within the last 10 years:unsure If all of the above answers are "NO", then may proceed with Cephalosporin use.   Ciprofloxacin Swelling, Rash   Sulfasalazine Swelling   Sulfa Antibiotics Swelling   Doxycycline Hyclate Rash   Levofloxacin Rash      Medication List    TAKE these medications   alfuzosin 10 MG 24 hr tablet Commonly known as: UROXATRAL Take 10 mg by mouth daily with breakfast.   aspirin EC 81 MG tablet Take 81 mg by mouth daily.   cefdinir 300 MG capsule Commonly known as: OMNICEF Take 1 capsule (300 mg total) by mouth every 12 (twelve) hours for 14 doses.   cetirizine 10 MG tablet Commonly known as: ZYRTEC Take 10 mg by mouth daily.   cyanocobalamin 1000 MCG tablet Take 1 tablet (1,000 mcg total) by mouth daily. Start taking on: September 24, 2018   finasteride 5 MG tablet Commonly known as: PROSCAR Take 5 mg by mouth daily.    iron polysaccharides 150 MG capsule Commonly known as: NIFEREX Take 1 capsule (150 mg total) by mouth daily. Start taking on: September 24, 2018   naphazoline-pheniramine 0.025-0.3 % ophthalmic solution Commonly known as: NAPHCON-A Place 1 drop into both eyes 2 (two) times daily.   ondansetron 4 MG tablet Commonly known as: ZOFRAN Take 1 tablet (4 mg total) by mouth every 6 (six) hours as needed for nausea.   protein supplement shake Liqd Commonly known as: PREMIER PROTEIN Take 2 oz by mouth daily as needed (food supplement). Add to PET butter pecan ice cream for calorie/protein supplement.   Trulance 3 MG Tabs Generic drug: Plecanatide Take 3 mg by mouth every morning.      Follow-up Information    Isaac Bliss, Rayford Halsted, MD. Call.  Specialty: Internal Medicine Why: Follow up within 1 week Contact information: [place] [address] [phone]        Kathie Rhodes, MD. Go to.   Specialty: Urology Why: Appointment scheduled for 09/29/2018 at 11:00 AM Contact information: [address] [place] [address] [phone]          Allergies  Allergen Reactions  . Amoxicillin Rash    Has patient had a PCN reaction causing immediate rash, facial/tongue/throat swelling, SOB or lightheadedness with hypotension:No Has patient had a PCN reaction causing severe rash involving mucus membranes or skin necrosis:unsure Has patient had a PCN reaction that required hospitalization:No Has patient had a PCN reaction occurring within the last 10 years:unsure If all of the above answers are "NO", then may proceed with Cephalosporin use.   . Ciprofloxacin Swelling and Rash  . Sulfasalazine Swelling  . Sulfa Antibiotics Swelling  . Doxycycline Hyclate Rash  . Levofloxacin Rash   Consultations:  None  Procedures/Studies: Ct Head Wo Contrast  Result Date: 09/22/2018 CLINICAL DATA:  Dizziness sepsis secondary to UTI. EXAM: CT HEAD WITHOUT CONTRAST  TECHNIQUE: Contiguous axial images were obtained from the base of the skull through the vertex without intravenous contrast. COMPARISON:  None. FINDINGS: Brain: No evidence of acute infarction, hemorrhage, hydrocephalus, extra-axial collection or mass lesion/mass effect. There is significant atrophy and chronic microvascular ischemic changes bilaterally. Vascular: No hyperdense vessel or unexpected calcification. Skull: Normal. Negative for fracture or focal lesion. Sinuses/Orbits: No acute finding. The patient is status post bilateral cataract surgery. Other: None. IMPRESSION: 1. No acute intracranial abnormality. 2. Significant volume loss with associated chronic microvascular ischemic changes. Electronically Signed   By: Constance Holster M.D.   On: 09/22/2018 14:08   US Renal  Result Date: 09/22/2018 CLINICAL DATA:  Acute urinary retention EXAM: RENAL / URINARY TRACT ULTRASOUND COMPLETE COMPARISON:  CT dated Aug 13, 2018 FINDINGS: Right Kidney: Renal measurements: 10.7 x 5.9 x 4.9 cm = volume: 162 mL. There is mild bilateral hydronephrosis. The echogenicity is slightly increased. There is no renal mass, however evaluation is limited by technique. Left Kidney: Renal measurements: 11 x 4.6 x 4.9 cm = volume: 130 mL. There is mild bilateral hydronephrosis. The echogenicity is slightly increased.There is no renal mass, however evaluation is limited by technique. Bladder: The bladder is not well evaluated secondary to the presence of a Foley catheter. Overall study was limited by patient condition and poor sonographic windows. IMPRESSION: 1. Limited study secondary to patient condition. 2. Persistent mild bilateral hydronephrosis, likely significantly improved from CT dated Aug 13, 2018 given differences in technique. 3. Bladder not well evaluated secondary to the presence of a Foley catheter. The bladder appears decompressed. Electronically Signed   By: Constance Holster M.D.   On: 09/22/2018 20:27      Subjective: Seen and examined at bedside was feeling better but denies chest pain, lightheadedness or dizziness.  No nausea or vomiting.  Ambulated well without issues and was deemed stable for discharge home and will need to follow-up with PCP and urology in outpatient setting  Discharge Exam: Vitals:   09/23/18 0459 09/23/18 1108  BP: 138/75   Pulse: 60   Resp: 19   Temp: 98 F (36.7 C)   SpO2: 100% 95%   Vitals:   09/22/18 1330 09/22/18 2105 09/23/18 0459 09/23/18 1108  BP: 97/60 129/70 138/75   Pulse: 66 64 60   Resp: 14 18 19    Temp: 97.8 F (36.6 C) 98  F (36.7 C) 98 F (36.7 C)   TempSrc: Oral Oral Oral   SpO2: 100% 100% 100% 95%  Weight:      Height:       General: Pt is alert, awake, not in acute distress Cardiovascular: RRR, S1/S2 +, no rubs, no gallops Respiratory: Diminished bilaterally, no wheezing, no rhonchi Abdominal: Soft, NT, ND, bowel sounds + Extremities: no edema, no cyanosis  The results of significant diagnostics from this hospitalization (including imaging, microbiology, ancillary and laboratory) are listed below for reference.    Microbiology: Recent Results (from the past 240 hour(s))  Urine Culture     Status: Abnormal   Collection Time: 09/21/18 12:27 AM   Specimen: Urine, Random  Result Value Ref Range Status   Specimen Description   Final    URINE, RANDOM Performed at [facility], [place] [address], [place], [place] [address]    Special Requests   Final    NONE Performed at [facility], [place] [address], [place], [place] [address]    Culture MULTIPLE SPECIES PRESENT, SUGGEST RECOLLECTION (A)  Final   Report Status 09/22/2018 FINAL  Final  Blood culture (routine x 2)     Status: None (Preliminary result)   Collection Time: 09/21/18 12:27 AM   Specimen: BLOOD LEFT ARM  Result Value Ref Range Status   Specimen Description   Final    BLOOD LEFT ARM Performed at [facility]  [address], [place], [place] [address]    Special Requests   Final    BOTTLES DRAWN AEROBIC AND ANAEROBIC Blood Culture adequate volume Performed at [facility] [address] [place], [place] [address]    Culture   Final    NO GROWTH 2 DAYS Performed at [facility] [address] [place], [place] [address]    Report Status PENDING  Incomplete  Blood culture (routine x 2)     Status: None (Preliminary result)   Collection Time: 09/21/18 12:27 AM   Specimen: BLOOD  Result Value Ref Range Status   Specimen Description   Final    BLOOD RIGHT ANTECUBITAL Performed at [facility] [address] [place], [place] [address]    Special Requests   Final    BOTTLES DRAWN AEROBIC AND ANAEROBIC Blood Culture results may not be optimal due to an excessive volume of blood received in culture bottles Performed at [facility] [address] [place], [place] [address]    Culture   Final    NO GROWTH 2 DAYS Performed at [facility] [address], [place],  [address]    Report Status PENDING  Incomplete  SARS Coronavirus 2 (CEPHEID- Performed in [facility] hospital lab), Hosp Order     Status: None   Collection Time: 09/21/18  1:26 AM   Specimen: Nasopharyngeal Swab  Result Value Ref Range Status   SARS Coronavirus 2 NEGATIVE NEGATIVE Final    Comment: (NOTE) If result is NEGATIVE SARS-CoV-2 target nucleic acids are NOT DETECTED. The SARS-CoV-2 RNA is generally detectable in upper and lower  respiratory specimens during the acute phase of infection. The lowest  concentration of SARS-CoV-2 viral copies this assay can detect is 250  copies / mL. A negative result does not preclude SARS-CoV-2 infection  and should not be used as the sole basis for treatment or other  patient management decisions.  A negative result may occur with  improper specimen  collection / handling, submission of specimen other  than  nasopharyngeal swab, presence of viral mutation(s) within the  areas targeted by this assay, and inadequate number of viral copies  (<250 copies / mL). A negative result must be combined with clinical  observations, patient history, and epidemiological information. If result is POSITIVE SARS-CoV-2 target nucleic acids are DETECTED. The SARS-CoV-2 RNA is generally detectable in upper and lower  respiratory specimens dur ing the acute phase of infection.  Positive  results are indicative of active infection with SARS-CoV-2.  Clinical  correlation with patient history and other diagnostic information is  necessary to determine patient infection status.  Positive results do  not rule out bacterial infection or co-infection with other viruses. If result is PRESUMPTIVE POSTIVE SARS-CoV-2 nucleic acids MAY BE PRESENT.   A presumptive positive result was obtained on the submitted specimen  and confirmed on repeat testing.  While 2019 novel coronavirus  (SARS-CoV-2) nucleic acids may be present in the submitted sample  additional confirmatory testing may be necessary for epidemiological  and / or clinical management purposes  to differentiate between  SARS-CoV-2 and other Sarbecovirus currently known to infect humans.  If clinically indicated additional testing with an alternate test  methodology [phone]) is advised. The SARS-CoV-2 RNA is generally  detectable in upper and lower respiratory sp ecimens during the acute  phase of infection. The expected result is Negative. Fact Sheet for Patients:  StrictlyIdeas.no Fact Sheet for Healthcare Providers: BankingDealers.co.za This test is not yet approved or cleared by the [place] FDA and has been authorized for detection and/or diagnosis of SARS-CoV-2 by FDA under an Emergency Use Authorization (EUA).  This EUA will remain in effect (meaning this test can be used) for the duration of  the COVID-19 declaration under Section 564(b)(1) of the Act, 21 U.S.C. section 360bbb-3(b)(1), unless the authorization is terminated or revoked sooner. Performed at [facility], [place] [address] [place], [place] [address]   Culture, Urine     Status: None   Collection Time: 09/22/18  8:06 AM   Specimen: Urine, Random  Result Value Ref Range Status   Specimen Description   Final    URINE, RANDOM Performed at [facility] [address], [place], [place] [address]    Special Requests   Final    NONE Performed at [facility], [place] [address], [place], [place] [address]    Culture   Final    NO GROWTH Performed at [facility], [place] [address] [place], [place] [address]    Report Status 09/23/2018 FINAL  Final    Labs: BNP (last 3 results) No results for input(s): BNP in the last 8760 hours. Basic Metabolic Panel: Recent Labs  Lab 09/20/18 2334 09/21/18 0518 09/22/18 0502 09/23/18 0508  NA 129* 136 140 140  K 4.7 4.1 3.9 3.5  CL 97* 107 113* 111  CO2 21* 19* 21* 22  GLUCOSE 113* 124* 91 91  BUN 26* 21 23 17   CREATININE 1.34* 1.20 1.11 1.03  CALCIUM 9.0 8.1* 8.6* 9.0  MG  --   --  2.4 1.9  PHOS  --   --  3.5 3.1   Liver Function Tests: Recent Labs  Lab 09/20/18 2334 09/21/18 0518 09/22/18 0502 09/23/18 0508  AST 22 18 15 17   ALT 13 11 11 11   ALKPHOS 77 58 54 53  BILITOT 1.6* 0.8 0.7 0.5  PROT 6.6 5.0* 5.4* 5.6*  ALBUMIN 3.8  2.8* 3.0* 2.9*   No results for input(s): LIPASE, AMYLASE in the last 168 hours. No results for input(s): AMMONIA in the last 168 hours. CBC: Recent Labs  Lab 09/20/18 2334 09/21/18 0518 09/22/18 0502 09/23/18 0508  WBC 14.5* 12.9* 7.8 6.9  NEUTROABS 12.9* 11.5* 5.5 4.6  HGB 10.7* 10.0* 9.4* 10.7*  HCT 32.1* 30.9* 30.0* 32.7*  MCV 92.8 94.5 98.4 95.6  PLT 186 168 155 165   Cardiac Enzymes: No results for input(s): CKTOTAL, CKMB, CKMBINDEX, TROPONINI in the last  168 hours. BNP: Invalid input(s): POCBNP CBG: No results for input(s): GLUCAP in the last 168 hours. D-Dimer No results for input(s): DDIMER in the last 72 hours. Hgb A1c Recent Labs    09/22/18 0502  HGBA1C 5.5   Lipid Profile No results for input(s): CHOL, HDL, LDLCALC, TRIG, CHOLHDL, LDLDIRECT in the last 72 hours. Thyroid function studies Recent Labs    09/22/18 0502  TSH 6.494*   Anemia work up Recent Labs    09/22/18 0502  VITAMINB12 199  FOLATE 8.0  FERRITIN 152  TIBC 207*  IRON 30*  RETICCTPCT 0.8   Urinalysis    Component Value Date/Time   COLORURINE YELLOW 09/21/2018 0027   APPEARANCEUR CLOUDY (A) 09/21/2018 0027   LABSPEC 1.004 (L) 09/21/2018 0027   PHURINE 7.0 09/21/2018 0027   GLUCOSEU NEGATIVE 09/21/2018 0027   HGBUR LARGE (A) 09/21/2018 0027   BILIRUBINUR NEGATIVE 09/21/2018 0027   BILIRUBINUR n 06/26/2016 0810   KETONESUR NEGATIVE 09/21/2018 0027   PROTEINUR 100 (A) 09/21/2018 0027   UROBILINOGEN 0.2 06/26/2016 0810   UROBILINOGEN 0.2 07/11/2012 0200   NITRITE NEGATIVE 09/21/2018 0027   LEUKOCYTESUR LARGE (A) 09/21/2018 0027   Sepsis Labs Invalid input(s): PROCALCITONIN,  WBC,  LACTICIDVEN Microbiology Recent Results (from the past 240 hour(s))  Urine Culture     Status: Abnormal   Collection Time: 09/21/18 12:27 AM   Specimen: Urine, Random  Result Value Ref Range Status   Specimen Description   Final    URINE, RANDOM Performed at [facility], [place]. [address] [place], [place] [address]    Special Requests   Final    NONE Performed at [facility], [place] [address] [place], [place] [address]    Culture MULTIPLE SPECIES PRESENT, SUGGEST RECOLLECTION (A)  Final   Report Status 09/22/2018 FINAL  Final  Blood culture (routine x 2)     Status: None (Preliminary result)   Collection Time: 09/21/18 12:27 AM   Specimen: BLOOD LEFT ARM  Result Value Ref Range Status   Specimen Description   Final    BLOOD LEFT  ARM Performed at [facility] [address] [place], [place] [address]    Special Requests   Final    BOTTLES DRAWN AEROBIC AND ANAEROBIC Blood Culture adequate volume Performed at [facility] [address], [place], [place] [address]    Culture   Final    NO GROWTH 2 DAYS Performed at [facility] [address] [place], [place] [address]    Report Status PENDING  Incomplete  Blood culture (routine x 2)     Status: None (Preliminary result)   Collection Time: 09/21/18 12:27 AM   Specimen: BLOOD  Result Value Ref Range Status   Specimen Description   Final    BLOOD RIGHT ANTECUBITAL Performed at [facility] [address] [place], [place] [address]    Special Requests   Final  BOTTLES DRAWN AEROBIC AND ANAEROBIC Blood Culture results may not be optimal due to an excessive volume of blood received in culture bottles Performed at [facility], [place] [address], [place], [place] [address]    Culture   Final    NO GROWTH 2 DAYS Performed at [facility] [address] [place], [place] [address]    Report Status PENDING  Incomplete  SARS Coronavirus 2 (CEPHEID- Performed in [facility] hospital lab), Hosp Order     Status: None   Collection Time: 09/21/18  1:26 AM   Specimen: Nasopharyngeal Swab  Result Value Ref Range Status   SARS Coronavirus 2 NEGATIVE NEGATIVE Final    Comment: (NOTE) If result is NEGATIVE SARS-CoV-2 target nucleic acids are NOT DETECTED. The SARS-CoV-2 RNA is generally detectable in upper and lower  respiratory specimens during the acute phase of infection. The lowest  concentration of SARS-CoV-2 viral copies this assay can detect is 250  copies / mL. A negative result does not preclude SARS-CoV-2 infection  and should not be used as the sole basis for treatment or other  patient management decisions.  A negative result may occur with  improper specimen  collection / handling, submission of specimen other  than nasopharyngeal swab, presence of viral mutation(s) within the  areas targeted by this assay, and inadequate number of viral copies  (<250 copies / mL). A negative result must be combined with clinical  observations, patient history, and epidemiological information. If result is POSITIVE SARS-CoV-2 target nucleic acids are DETECTED. The SARS-CoV-2 RNA is generally detectable in upper and lower  respiratory specimens dur ing the acute phase of infection.  Positive  results are indicative of active infection with SARS-CoV-2.  Clinical  correlation with patient history and other diagnostic information is  necessary to determine patient infection status.  Positive results do  not rule out bacterial infection or co-infection with other viruses. If result is PRESUMPTIVE POSTIVE SARS-CoV-2 nucleic acids MAY BE PRESENT.   A presumptive positive result was obtained on the submitted specimen  and confirmed on repeat testing.  While 2019 novel coronavirus  (SARS-CoV-2) nucleic acids may be present in the submitted sample  additional confirmatory testing may be necessary for epidemiological  and / or clinical management purposes  to differentiate between  SARS-CoV-2 and other Sarbecovirus currently known to infect humans.  If clinically indicated additional testing with an alternate test  methodology [phone]) is advised. The SARS-CoV-2 RNA is generally  detectable in upper and lower respiratory sp ecimens during the acute  phase of infection. The expected result is Negative. Fact Sheet for Patients:  StrictlyIdeas.no Fact Sheet for Healthcare Providers: BankingDealers.co.za This test is not yet approved or cleared by the [place] FDA and has been authorized for detection and/or diagnosis of SARS-CoV-2 by FDA under an Emergency Use Authorization (EUA).  This EUA will remain in effect  (meaning this test can be used) for the duration of the COVID-19 declaration under Section 564(b)(1) of the Act, 21 U.S.C. section 360bbb-3(b)(1), unless the authorization is terminated or revoked sooner. Performed at [facility], [place] [address], [place], [place] [address]   Culture, Urine     Status: None   Collection Time: 09/22/18  8:06 AM   Specimen: Urine, Random  Result Value Ref Range Status   Specimen Description   Final    URINE, RANDOM Performed at [facility] [address] [place], [place] [address]    Special  Requests   Final    NONE Performed at [facility], [place] [address] [place], [place] [address]    Culture   Final    NO GROWTH Performed at [facility], [place] [address], [place], [place] [address]    Report Status 09/23/2018 FINAL  Final   Time coordinating discharge: 35 minutes  SIGNED:  Kerney Elbe, DO [facility] 09/23/2018, 11:12 AM Pager is on [place]  If 7PM-7AM, please contact night-coverage [email or web address] Password [email or web address]

## 2018-09-23 NOTE — Telephone Encounter (Signed)
Copied from [place] [id number]. Topic: General - Inquiry >> Sep 23, 2018 11:58 AM Scherrie Gerlach wrote: Reason for CRM: wife wants dr Jerilee Hoh to know pt was admitted to the hospital on Tues.  May come home today. She will look for a call from [facility]neiders to schedule hosp fup.  She would like to know if Dr Jerilee Hoh will look at his ct scan and advise if indications if pt has had any mini strokes? Will look forward to seeing the dr. Pt also had MRI done at [facility] on 09/14/18.  Wife would like a call if dr does see anything suspicious prior to a visit.

## 2018-09-23 NOTE — Care Management Important Message (Signed)
Important Message  Patient Details  Name: Benjamin Cardenas MRN: [id number] Date of Birth: [birth year]   Medicare Important Message Given:  Yes. CMA printed out IM for Case Manager or LCSW to give to patient.      Meko Bellanger 09/23/2018, 8:29 AM

## 2018-09-23 NOTE — Plan of Care (Signed)
Problem: Education: Goal: Knowledge of General Education information will improve Description: Including pain rating scale, medication(s)/side effects and non-pharmacologic comfort measures 09/23/2018 1818 by Andre Lefort, RN Outcome: Adequate for Discharge 09/23/2018 1817 by Andre Lefort, RN Outcome: Adequate for Discharge 09/23/2018 1816 by Andre Lefort, RN Outcome: Adequate for Discharge   Problem: Health Behavior/Discharge Planning: Goal: Ability to manage health-related needs will improve 09/23/2018 1818 by Andre Lefort, RN Outcome: Adequate for Discharge 09/23/2018 1817 by Andre Lefort, RN Outcome: Adequate for Discharge 09/23/2018 1816 by Andre Lefort, RN Outcome: Adequate for Discharge   Problem: Clinical Measurements: Goal: Ability to maintain clinical measurements within normal limits will improve 09/23/2018 1818 by Andre Lefort, RN Outcome: Adequate for Discharge 09/23/2018 1817 by Andre Lefort, RN Outcome: Adequate for Discharge 09/23/2018 1816 by Andre Lefort, RN Outcome: Adequate for Discharge Goal: Will remain free from infection 09/23/2018 1818 by Andre Lefort, RN Outcome: Adequate for Discharge 09/23/2018 1817 by Andre Lefort, RN Outcome: Adequate for Discharge 09/23/2018 1816 by Andre Lefort, RN Outcome: Adequate for Discharge Goal: Diagnostic test results will improve 09/23/2018 1818 by Andre Lefort, RN Outcome: Adequate for Discharge 09/23/2018 1817 by Andre Lefort, RN Outcome: Adequate for Discharge 09/23/2018 1816 by Andre Lefort, RN Outcome: Adequate for Discharge Goal: Respiratory complications will improve 09/23/2018 1818 by Andre Lefort, RN Outcome: Adequate for Discharge 09/23/2018 1817 by Andre Lefort, RN Outcome: Adequate for Discharge 09/23/2018 1816 by Andre Lefort, RN Outcome: Adequate for Discharge Goal: Cardiovascular complication will be avoided 09/23/2018 1818 by Andre Lefort, RN Outcome: Adequate for Discharge 09/23/2018 1817 by Andre Lefort,  RN Outcome: Adequate for Discharge 09/23/2018 1816 by Andre Lefort, RN Outcome: Adequate for Discharge   Problem: Activity: Goal: Risk for activity intolerance will decrease 09/23/2018 1818 by Andre Lefort, RN Outcome: Adequate for Discharge 09/23/2018 1817 by Andre Lefort, RN Outcome: Adequate for Discharge 09/23/2018 1816 by Andre Lefort, RN Outcome: Adequate for Discharge   Problem: Nutrition: Goal: Adequate nutrition will be maintained 09/23/2018 1818 by Andre Lefort, RN Outcome: Adequate for Discharge 09/23/2018 1817 by Andre Lefort, RN Outcome: Adequate for Discharge 09/23/2018 1816 by Andre Lefort, RN Outcome: Adequate for Discharge   Problem: Elimination: Goal: Will not experience complications related to bowel motility 09/23/2018 1818 by Andre Lefort, RN Outcome: Adequate for Discharge 09/23/2018 1817 by Andre Lefort, RN Outcome: Adequate for Discharge 09/23/2018 1816 by Andre Lefort, RN Outcome: Adequate for Discharge Goal: Will not experience complications related to urinary retention 09/23/2018 1818 by Andre Lefort, RN Outcome: Adequate for Discharge 09/23/2018 1817 by Andre Lefort, RN Outcome: Adequate for Discharge 09/23/2018 1816 by Andre Lefort, RN Outcome: Adequate for Discharge   Problem: Pain Managment: Goal: General experience of comfort will improve 09/23/2018 1818 by Andre Lefort, RN Outcome: Adequate for Discharge 09/23/2018 1817 by Andre Lefort, RN Outcome: Adequate for Discharge 09/23/2018 1816 by Andre Lefort, RN Outcome: Adequate for Discharge   Problem: Safety: Goal: Ability to remain free from injury will improve 09/23/2018 1818 by Andre Lefort, RN Outcome: Adequate for Discharge 09/23/2018 1817 by Andre Lefort, RN Outcome: Adequate for Discharge 09/23/2018 1816 by Andre Lefort, RN Outcome: Adequate for Discharge   Problem: Skin Integrity: Goal: Risk for impaired skin integrity will decrease 09/23/2018 1818 by Andre Lefort, RN Outcome: Adequate for  Discharge 09/23/2018 1817 by  Andre Lefort, RN Outcome: Adequate for Discharge 09/23/2018 1816 by Andre Lefort, RN Outcome: Adequate for Discharge

## 2018-09-23 NOTE — Evaluation (Signed)
Occupational Therapy Evaluation Patient Details Name: Benjamin Cardenas MRN: [id number] DOB: February 14, 1930 Today's Date: 09/23/2018    History of Present Illness The patient is an 85 year old with a past medical history significant for but not limited to AAA, BPH with urinary obstruction, history of bladder cancer, history of hydronephrosis on the left side, osteoarthritis, seasonal allergic rhinitis as well as other comorbidities who recently was admitted last month with UTI and urinary retention now presented to the ED lower abdominal pain after his Foley catheter removed earlier in the day.   Clinical Impression   Pt was admitted for the above. He has had some dizziness and he describes lightheadedness/spinning and unbalanced. Functionally tested for BPPV, which was negative and he was asymptomatic.  Orthostatics were done earlier by nursing and negative.  He walked in hallway and was able to turn head without symptoms. Reviewed moving slowly, giving himself time to adjust and turning head before body if dizzy.  Plan is for home today. Pt is not back to baseline. Would benefit from [facility]    Follow Up Recommendations  Supervision/Assistance - 24 hour;Home health OT    Equipment Recommendations  None recommended by OT    Recommendations for Other Services       Precautions / Restrictions Precautions Precautions: Fall Restrictions Weight Bearing Restrictions: No      Mobility Bed Mobility         Supine to sit: Supervision Sit to supine: Supervision      Transfers   Equipment used: 4-wheeled walker   Sit to Stand: Supervision;Min guard         General transfer comment: for safety    Balance                                           ADL either performed or assessed with clinical judgement   ADL Overall ADL's : Needs assistance/impaired     Grooming: Wash/dry hands;Supervision/safety;Standing   Upper Body Bathing: Set up   Lower Body Bathing:  Minimal assistance;Sit to/from stand   Upper Body Dressing : Minimal assistance(lines)   Lower Body Dressing: Moderate assistance;Sit to/from stand   Toilet Transfer: Supervision/safety;Minimal assistance;Ambulation;Comfort height toilet;Grab bars   Toileting- Clothing Manipulation and Hygiene: Supervision/safety;Sit to/from stand         General ADL Comments: Pt states he is not flexible; could not reach to don socks. Unsure how he did this at home, may be due to surface height or maybe catheter.       Vision         Perception     Praxis      Pertinent Vitals/Pain Pain Assessment: No/denies pain     Hand Dominance     Extremity/Trunk Assessment Upper Extremity Assessment Upper Extremity Assessment: Overall WFL for tasks assessed           Communication Communication Communication: No difficulties   Cognition Arousal/Alertness: Awake/alert Behavior During Therapy: WFL for tasks assessed/performed Overall Cognitive Status: Within Functional Limits for tasks assessed                                     General Comments  pt was not dizzy during ot session  able to turn head when walking  functionaaly, no signs of bppv    Exercises  Shoulder Instructions      Home Living Family/patient expects to be discharged to:: Private residence Living Arrangements: Spouse/significant other Available Help at Discharge: Family;Available 24 hours/day               Bathroom Shower/Tub: Occupational psychologist: Handicapped height     Home Equipment: Shower seat - built in;Grab bars - tub/shower          Prior Functioning/Environment Level of Independence: Independent with assistive device(s)                 OT Problem List:        OT Treatment/Interventions:      OT Goals(Current goals can be found in the care plan section) Acute Rehab OT Goals Patient Stated Goal: To walk and get stronger OT Goal Formulation: All  assessment and education complete, DC therapy  OT Frequency:     Barriers to D/C:            Co-evaluation              AM-PAC OT "6 Clicks" Daily Activity     Outcome Measure Help from another person eating meals?: None Help from another person taking care of personal grooming?: A Little Help from another person toileting, which includes using toliet, bedpan, or urinal?: A Little Help from another person bathing (including washing, rinsing, drying)?: A Little Help from another person to put on and taking off regular upper body clothing?: A Little Help from another person to put on and taking off regular lower body clothing?: A Lot 6 Click Score: 18   End of Session    Activity Tolerance: Patient tolerated treatment well Patient left: in chair;with call bell/phone within reach;with chair alarm set  OT Visit Diagnosis: Muscle weakness (generalized) (M62.81)                Time: 2536-6440 OT Time Calculation (min): 32 min Charges:  OT General Charges $OT Visit: 1 Visit OT Evaluation $OT Eval Low Complexity: 1 Low OT Treatments $Self Care/Home Management : 8-22 mins  Lesle Chris, OTR/L Acute Rehabilitation Services [phone] [facility] pager [phone] office 09/23/2018  [place] 09/23/2018, 12:40 PM

## 2018-09-23 NOTE — Telephone Encounter (Signed)
TCM  

## 2018-09-23 NOTE — TOC Transition Note (Signed)
Transition of Care [facility]) - CM/SW Discharge Note   Patient Details  Name: Benjamin Cardenas MRN: [id number] Date of Birth: 12-14-1929  [facility]) CM/SW Contact:  Dessa Phi, RN Phone Number: 09/23/2018, 12:20 PM   Clinical Narrative:  Patient used [facility] in the past will use again-TC Tommi Rumps rep aware of d/c home w/HHC. Patient has own transport home. No further CM needs     Final next level of care: Home w Home Health Services Barriers to Discharge: No Barriers Identified   Patient Goals and CMS Choice Patient states their goals for this hospitalization and ongoing recovery are:: go home CMS Medicare.gov Compare Post Acute Care list provided to:: Patient Choice offered to / list presented to : Patient  Discharge Placement                       Discharge Plan and Services                          HH Arranged: PT, OT, Nurse's Aide [facility] Agency: [place] Date [facility] Agency Contacted: 09/23/18 Time HH Agency Contacted: [phone]    Social Determinants of Health (SDOH) Interventions     Readmission Risk Interventions No flowsheet data found.

## 2018-09-24 DIAGNOSIS — C679 Malignant neoplasm of bladder, unspecified: Secondary | ICD-10-CM | POA: Diagnosis not present

## 2018-09-24 DIAGNOSIS — N179 Acute kidney failure, unspecified: Secondary | ICD-10-CM | POA: Diagnosis not present

## 2018-09-24 DIAGNOSIS — G9340 Encephalopathy, unspecified: Secondary | ICD-10-CM | POA: Diagnosis not present

## 2018-09-24 DIAGNOSIS — N401 Enlarged prostate with lower urinary tract symptoms: Secondary | ICD-10-CM | POA: Diagnosis not present

## 2018-09-24 DIAGNOSIS — N390 Urinary tract infection, site not specified: Secondary | ICD-10-CM | POA: Diagnosis not present

## 2018-09-24 DIAGNOSIS — A419 Sepsis, unspecified organism: Secondary | ICD-10-CM | POA: Diagnosis not present

## 2018-09-26 ENCOUNTER — Telehealth: Payer: Self-pay | Admitting: *Deleted

## 2018-09-26 DIAGNOSIS — A419 Sepsis, unspecified organism: Secondary | ICD-10-CM | POA: Diagnosis not present

## 2018-09-26 DIAGNOSIS — N401 Enlarged prostate with lower urinary tract symptoms: Secondary | ICD-10-CM | POA: Diagnosis not present

## 2018-09-26 DIAGNOSIS — C679 Malignant neoplasm of bladder, unspecified: Secondary | ICD-10-CM | POA: Diagnosis not present

## 2018-09-26 DIAGNOSIS — N390 Urinary tract infection, site not specified: Secondary | ICD-10-CM | POA: Diagnosis not present

## 2018-09-26 DIAGNOSIS — G9340 Encephalopathy, unspecified: Secondary | ICD-10-CM | POA: Diagnosis not present

## 2018-09-26 DIAGNOSIS — N179 Acute kidney failure, unspecified: Secondary | ICD-10-CM | POA: Diagnosis not present

## 2018-09-26 LAB — CULTURE, BLOOD (ROUTINE X 2)
Culture: NO GROWTH
Culture: NO GROWTH
Special Requests: ADEQUATE

## 2018-09-26 NOTE — Telephone Encounter (Signed)
Transition Care Management Follow-up Telephone Call   Date discharged? 09/23/2018   How have you been since you were released from the hospital? Doing reasonably well    Do you understand why you were in the hospital? yes   Do you understand the discharge instructions? yes   Where were you discharged to? Home    Items Reviewed:  Medications reviewed: yes  Allergies reviewed: yes  Dietary changes reviewed: yes  Referrals reviewed: yes   Functional Questionnaire:   Activities of Daily Living (ADLs):   He states they are independent in the following: ambulation, bathing and hygiene, feeding, grooming and dressing States they require assistance with the following: continence and toileting   Any transportation issues/concerns?: no   Any patient concerns? Yes, does want Dr. Jerilee Hoh to look at his MRI and CT scan   Confirmed importance and date/time of follow-up visits scheduled yes  Provider Appointment booked with  Dr. Jerilee Hoh virtually 09/29/2018 at 3:30PM  Confirmed with patient if condition begins to worsen call PCP or go to the ER.  Patient was given the office number and encouraged to call back with question or concerns.  : yes

## 2018-09-26 NOTE — Telephone Encounter (Signed)
TCM completed.

## 2018-09-27 ENCOUNTER — Telehealth: Payer: Self-pay | Admitting: Internal Medicine

## 2018-09-27 DIAGNOSIS — M5126 Other intervertebral disc displacement, lumbar region: Secondary | ICD-10-CM | POA: Diagnosis not present

## 2018-09-27 DIAGNOSIS — M5416 Radiculopathy, lumbar region: Secondary | ICD-10-CM | POA: Diagnosis not present

## 2018-09-27 NOTE — Telephone Encounter (Signed)
Left message on Benjamin Cardenas's confidential voicemail.

## 2018-09-27 NOTE — Telephone Encounter (Signed)
Home Health Verbal Orders - Caller/Agency: Timmothy Sours with [facility] Number: [phone] Requesting OT/PT/Skilled Nursing/Social Work/Speech Therapy: Needing verbals for OT , adl transfers and exercise  Frequency: 1 week 2

## 2018-09-27 NOTE — Telephone Encounter (Signed)
OK to request

## 2018-09-28 DIAGNOSIS — N179 Acute kidney failure, unspecified: Secondary | ICD-10-CM | POA: Diagnosis not present

## 2018-09-28 DIAGNOSIS — N401 Enlarged prostate with lower urinary tract symptoms: Secondary | ICD-10-CM | POA: Diagnosis not present

## 2018-09-28 DIAGNOSIS — A419 Sepsis, unspecified organism: Secondary | ICD-10-CM | POA: Diagnosis not present

## 2018-09-28 DIAGNOSIS — G9340 Encephalopathy, unspecified: Secondary | ICD-10-CM | POA: Diagnosis not present

## 2018-09-28 DIAGNOSIS — C679 Malignant neoplasm of bladder, unspecified: Secondary | ICD-10-CM | POA: Diagnosis not present

## 2018-09-28 DIAGNOSIS — N390 Urinary tract infection, site not specified: Secondary | ICD-10-CM | POA: Diagnosis not present

## 2018-09-29 ENCOUNTER — Ambulatory Visit (INDEPENDENT_AMBULATORY_CARE_PROVIDER_SITE_OTHER): Payer: Medicare Other | Admitting: Internal Medicine

## 2018-09-29 ENCOUNTER — Other Ambulatory Visit: Payer: Self-pay

## 2018-09-29 DIAGNOSIS — N390 Urinary tract infection, site not specified: Secondary | ICD-10-CM

## 2018-09-29 DIAGNOSIS — N320 Bladder-neck obstruction: Secondary | ICD-10-CM | POA: Diagnosis not present

## 2018-09-29 DIAGNOSIS — Z09 Encounter for follow-up examination after completed treatment for conditions other than malignant neoplasm: Secondary | ICD-10-CM | POA: Diagnosis not present

## 2018-09-29 DIAGNOSIS — M5431 Sciatica, right side: Secondary | ICD-10-CM | POA: Diagnosis not present

## 2018-09-29 DIAGNOSIS — A419 Sepsis, unspecified organism: Secondary | ICD-10-CM | POA: Diagnosis not present

## 2018-09-29 DIAGNOSIS — Z8551 Personal history of malignant neoplasm of bladder: Secondary | ICD-10-CM | POA: Diagnosis not present

## 2018-09-29 DIAGNOSIS — R338 Other retention of urine: Secondary | ICD-10-CM | POA: Diagnosis not present

## 2018-09-29 DIAGNOSIS — N1339 Other hydronephrosis: Secondary | ICD-10-CM | POA: Diagnosis not present

## 2018-09-29 NOTE — Progress Notes (Signed)
Virtual Visit via Video Note  I connected with Benjamin Cardenas on 09/29/18 at  3:30 PM EDT by a video enabled telemedicine application and verified that I am speaking with the correct person using two identifiers.  Location patient: home Location provider: work office Persons participating in the virtual visit: patient, provider, wife Benjamin Cardenas who assists with history and technology  I discussed the limitations of evaluation and management by telemedicine and the availability of in person appointments. The patient expressed understanding and agreed to proceed.   HPI: Benjamin Cardenas is an 85 y/o man who is coming in today for the above mentioned reasons, specifically transitional care services face-to-face visit.    Dates hospitalized: 09/20/18-09/23/18 Days since discharge from hospital: 6 Patient was discharged from the hospital to: home Reason for admission to hospital: Sepsis 2/2 UTI Date of interactive phone contact with patient and/or caregiver: 09/23/18  I have reviewed in detail patient's discharge summary plus pertinent specific notes, labs, and images from the hospitalization.  On day of admission, he had seen his GU and his catheter was removed. Later on that day he started leaking urine. At around 10 pm he was lethargic, confused and shaking. Temp at home was 101. He was brought to the hospital. Catheter was replaced and he was started on Cefepime. Blood and urine cx were eventually negative. He was discharged on cefdinir of which he has 2 more doses remaining. He did have some dizziness that was thought likely related to sepsis (has now completely resolved) in addition to his alpha blocker. He was orthostatic in the hospital.  He is back to baseline after DC. Dizziness has resolved. He saw GU today, had a cystoscopy. Benjamin Cardenas tells me there was some areas of concern (he has a h/o bladder cancer) and GU is planning on some sort of procedure for both the bladder and the prostate. I do not  have records yet of this visit.  He has chronic low back pain and sciatica and is scheduled for an epidural injection on July 7th.  He has been doing HHPT. Benjamin Cardenas states the therapists are impressed with how well he is doing.  Medication reconciliation was done today and patient is taking meds as recommended by discharging hospitalist/specialist: yes    ROS: Constitutional: Denies fever, chills, diaphoresis, appetite change and fatigue.  HEENT: Denies photophobia, eye pain, redness, hearing loss, ear pain, congestion, sore throat, rhinorrhea, sneezing, mouth sores, trouble swallowing, neck pain, neck stiffness and tinnitus.   Respiratory: Denies SOB, DOE, cough, chest tightness,  and wheezing.   Cardiovascular: Denies chest pain, palpitations and leg swelling.  Gastrointestinal: Denies nausea, vomiting, abdominal pain, diarrhea, constipation, blood in stool and abdominal distention.  Genitourinary: Denies dysuria, urgency, frequency, hematuria, flank pain and difficulty urinating.  Endocrine: Denies: hot or cold intolerance, sweats, changes in hair or nails, polyuria, polydipsia. Musculoskeletal: Denies myalgias, back pain, joint swelling, arthralgias and gait problem.  Skin: Denies pallor, rash and wound.  Neurological: Denies dizziness, seizures, syncope, weakness, light-headedness, numbness and headaches.  Hematological: Denies adenopathy. Easy bruising, personal or family bleeding history  Psychiatric/Behavioral: Denies suicidal ideation, mood changes, confusion, nervousness, sleep disturbance and agitation   Past Medical History:  Diagnosis Date  . Bladder cancer [facility]) urology-- dr Karsten Ro   dx 11/ 2016  Transitional cell carcinoma, High grade invasive , Stage T1  G3 ---  s/p  TURBT and Mitomycin C instillation  . BPH with urinary obstruction   . Dry skin dermatitis   . Elevated PSA   .  History of colon polyps   . Hydronephrosis, left   . Lower urinary tract symptoms (LUTS)    . OA (osteoarthritis)   . Prostate nodule   . Seasonal allergic rhinitis   . Wears glasses     Past Surgical History:  Procedure Laterality Date  . cataract surgery    . CYSTOSCOPY WITH BIOPSY N/A 04/22/2015   Procedure: CYSTOSCOPY WITH BLADDER BIOPSY;  Surgeon: Kathie Rhodes, MD;  Location: [facility];  Service: Urology;  Laterality: N/A;  . CYSTOSCOPY WITH RETROGRADE PYELOGRAM, URETEROSCOPY AND STENT PLACEMENT Left 02/25/2015   Procedure: LEFT RETROGRADE PYELOGRAM, URETEROSCOPY ;  Surgeon: Kathie Rhodes, MD;  Location: [facility];  Service: Urology;  Laterality: Left;  . HERNIA REPAIR  age 31 1937   rupture right inguinal hernia  . INGUINAL HERNIA REPAIR  03/02/2011   Procedure: HERNIA REPAIR INGUINAL ADULT;  Surgeon: Earnstine Regal, MD;  Location: [facility];  Service: General;  Laterality: Left;  with mesh   . TRANSURETHRAL RESECTION OF BLADDER TUMOR WITH GYRUS (TURBT-GYRUS) N/A 02/25/2015   Procedure: TRANSURETHRAL RESECTION OF BLADDER TUMOR WITH GYRUS (TURBT-GYRUS);  Surgeon: Kathie Rhodes, MD;  Location: [facility];  Service: Urology;  Laterality: N/A;    Family History  Problem Relation Age of Onset  . Heart disease Mother   . Heart disease Father   . Cancer Sister        breast  . Cancer Brother        colon    SOCIAL HX:   reports that he quit smoking about 39 years ago. His smoking use included cigarettes. He has a 51.00 pack-year smoking history. He has never used smokeless tobacco. He reports current alcohol use. He reports that he does not use drugs.   Current Outpatient Medications:  .  alfuzosin (UROXATRAL) 10 MG 24 hr tablet, Take 10 mg by mouth daily with breakfast., Disp: , Rfl:  .  aspirin EC 81 MG tablet, Take 81 mg by mouth daily. , Disp: , Rfl:  .  cefdinir (OMNICEF) 300 MG capsule, Take 1 capsule (300 mg total) by mouth every 12 (twelve) hours for 14 doses., Disp: 14 capsule, Rfl: 0 .  cetirizine (ZYRTEC) 10 MG  tablet, Take 10 mg by mouth daily. , Disp: , Rfl:  .  finasteride (PROSCAR) 5 MG tablet, Take 5 mg by mouth daily. , Disp: , Rfl:  .  iron polysaccharides (NIFEREX) 150 MG capsule, Take 1 capsule (150 mg total) by mouth daily., Disp: 30 capsule, Rfl: 0 .  naphazoline-pheniramine (NAPHCON-A) 0.025-0.3 % ophthalmic solution, Place 1 drop into both eyes 2 (two) times daily., Disp: , Rfl:  .  ondansetron (ZOFRAN) 4 MG tablet, Take 1 tablet (4 mg total) by mouth every 6 (six) hours as needed for nausea., Disp: 20 tablet, Rfl: 0 .  protein supplement shake (PREMIER PROTEIN) LIQD, Take 2 oz by mouth daily as needed (food supplement). Add to PET butter pecan ice cream for calorie/protein supplement., Disp: , Rfl:  .  TRULANCE 3 MG TABS, Take 3 mg by mouth every morning. , Disp: , Rfl:  .  vitamin B-12 1000 MCG tablet, Take 1 tablet (1,000 mcg total) by mouth daily., Disp: 30 tablet, Rfl: 0  EXAM:   VITALS per patient if applicable: none reported  GENERAL: alert, oriented, appears well and in no acute distress. I have seen him ambulating in the background on the video visit as the house phone rang and is walking  without issues.  HEENT: atraumatic, conjunttiva clear, no obvious abnormalities on inspection of external nose and ears  NECK: normal movements of the head and neck  LUNGS: on inspection no signs of respiratory distress, breathing rate appears normal, no obvious gross increased work of breathing, gasping or wheezing  CV: no obvious cyanosis  MS: moves all visible extremities without noticeable abnormality  PSYCH/NEURO: pleasant and cooperative, no obvious depression or anxiety, speech and thought processing grossly intact  ASSESSMENT AND PLAN:   Hospital discharge follow-up Sepsis secondary to UTI [facility]) Bladder outlet obstruction  -Seems to be doing quite well post DC. -Advised to finish last 2 doses of cefdinir. -Wife is concerned after GU appointment today about the areas in his  bladder with his h/o bladder cancer. There are plans for some kind of surgical procedure in the near future (?bladder biopsies or TURP...await GU records)  Sciatica of right side -Scheduled for epidural injection in 2 weeks. -This continues to be a significant issue for him that limits his activities. Nonetheless, he is performing well with PT per report.      I discussed the assessment and treatment plan with the patient. The patient was provided an opportunity to ask questions and all were answered. The patient agreed with the plan and demonstrated an understanding of the instructions.   The patient was advised to call back or seek an in-person evaluation if the symptoms worsen or if the condition fails to improve as anticipated.    Lelon Frohlich, MD   Primary Care at [facility]

## 2018-09-30 DIAGNOSIS — N179 Acute kidney failure, unspecified: Secondary | ICD-10-CM | POA: Diagnosis not present

## 2018-09-30 DIAGNOSIS — G9340 Encephalopathy, unspecified: Secondary | ICD-10-CM | POA: Diagnosis not present

## 2018-09-30 DIAGNOSIS — N390 Urinary tract infection, site not specified: Secondary | ICD-10-CM | POA: Diagnosis not present

## 2018-09-30 DIAGNOSIS — C679 Malignant neoplasm of bladder, unspecified: Secondary | ICD-10-CM | POA: Diagnosis not present

## 2018-09-30 DIAGNOSIS — A419 Sepsis, unspecified organism: Secondary | ICD-10-CM | POA: Diagnosis not present

## 2018-09-30 DIAGNOSIS — N401 Enlarged prostate with lower urinary tract symptoms: Secondary | ICD-10-CM | POA: Diagnosis not present

## 2018-10-04 ENCOUNTER — Other Ambulatory Visit: Payer: Self-pay | Admitting: Urology

## 2018-10-04 DIAGNOSIS — C679 Malignant neoplasm of bladder, unspecified: Secondary | ICD-10-CM | POA: Diagnosis not present

## 2018-10-04 DIAGNOSIS — N390 Urinary tract infection, site not specified: Secondary | ICD-10-CM | POA: Diagnosis not present

## 2018-10-04 DIAGNOSIS — N179 Acute kidney failure, unspecified: Secondary | ICD-10-CM | POA: Diagnosis not present

## 2018-10-04 DIAGNOSIS — N401 Enlarged prostate with lower urinary tract symptoms: Secondary | ICD-10-CM | POA: Diagnosis not present

## 2018-10-04 DIAGNOSIS — A419 Sepsis, unspecified organism: Secondary | ICD-10-CM | POA: Diagnosis not present

## 2018-10-04 DIAGNOSIS — G9340 Encephalopathy, unspecified: Secondary | ICD-10-CM | POA: Diagnosis not present

## 2018-10-05 ENCOUNTER — Telehealth: Payer: Self-pay | Admitting: *Deleted

## 2018-10-05 DIAGNOSIS — G9340 Encephalopathy, unspecified: Secondary | ICD-10-CM | POA: Diagnosis not present

## 2018-10-05 DIAGNOSIS — A419 Sepsis, unspecified organism: Secondary | ICD-10-CM | POA: Diagnosis not present

## 2018-10-05 DIAGNOSIS — N390 Urinary tract infection, site not specified: Secondary | ICD-10-CM | POA: Diagnosis not present

## 2018-10-05 DIAGNOSIS — C679 Malignant neoplasm of bladder, unspecified: Secondary | ICD-10-CM | POA: Diagnosis not present

## 2018-10-05 DIAGNOSIS — N179 Acute kidney failure, unspecified: Secondary | ICD-10-CM | POA: Diagnosis not present

## 2018-10-05 DIAGNOSIS — N401 Enlarged prostate with lower urinary tract symptoms: Secondary | ICD-10-CM | POA: Diagnosis not present

## 2018-10-05 NOTE — Telephone Encounter (Signed)
Copied from [place] [id number]. Topic: General - Call Back - No Documentation >> Oct 05, 2018 10:46 AM Erick Blinks wrote: Reason for CRM: Pt's wife called to report that appts have been scheduled for august 3rd. Pt's wife states that he had trouble remembering/connecting information while he was in the hospital. Requesting Apolonio Schneiders to call back regarding CT scan. Pt's wife has questions [place] [phone]

## 2018-10-06 NOTE — Telephone Encounter (Signed)
Spoke with wife and she is concerned that the patient may have memory loss, difficulty with expression, and aphasia?  She would like Dr Basil Dess thoughts about these concerns.

## 2018-10-06 NOTE — Telephone Encounter (Signed)
What questions does she have exactly? CT scan was normal for acute issues. He does have chronic volume loss that is normal for age.

## 2018-10-06 NOTE — Telephone Encounter (Signed)
Are these issues new since we last spoke? If so, I would have concerns for an acute CVA and would likely refer to ED for evaluation. He seemed very coherent to me on our last visit. Also would be concerned for a repeat kidney infection causing his symptoms.Marland KitchenMarland Kitchen

## 2018-10-11 ENCOUNTER — Other Ambulatory Visit: Payer: Self-pay

## 2018-10-11 ENCOUNTER — Encounter: Payer: Self-pay | Admitting: Podiatry

## 2018-10-11 ENCOUNTER — Ambulatory Visit (INDEPENDENT_AMBULATORY_CARE_PROVIDER_SITE_OTHER): Payer: Medicare Other | Admitting: Podiatry

## 2018-10-11 DIAGNOSIS — G629 Polyneuropathy, unspecified: Secondary | ICD-10-CM

## 2018-10-11 DIAGNOSIS — C679 Malignant neoplasm of bladder, unspecified: Secondary | ICD-10-CM | POA: Diagnosis not present

## 2018-10-11 DIAGNOSIS — M79674 Pain in right toe(s): Secondary | ICD-10-CM | POA: Diagnosis not present

## 2018-10-11 DIAGNOSIS — M79675 Pain in left toe(s): Secondary | ICD-10-CM

## 2018-10-11 DIAGNOSIS — G9340 Encephalopathy, unspecified: Secondary | ICD-10-CM | POA: Diagnosis not present

## 2018-10-11 DIAGNOSIS — L84 Corns and callosities: Secondary | ICD-10-CM

## 2018-10-11 DIAGNOSIS — A419 Sepsis, unspecified organism: Secondary | ICD-10-CM | POA: Diagnosis not present

## 2018-10-11 DIAGNOSIS — B351 Tinea unguium: Secondary | ICD-10-CM

## 2018-10-11 DIAGNOSIS — N179 Acute kidney failure, unspecified: Secondary | ICD-10-CM | POA: Diagnosis not present

## 2018-10-11 DIAGNOSIS — N390 Urinary tract infection, site not specified: Secondary | ICD-10-CM | POA: Diagnosis not present

## 2018-10-11 DIAGNOSIS — N401 Enlarged prostate with lower urinary tract symptoms: Secondary | ICD-10-CM | POA: Diagnosis not present

## 2018-10-11 NOTE — Patient Instructions (Signed)

## 2018-10-11 NOTE — Telephone Encounter (Signed)
Wife is aware

## 2018-10-13 NOTE — Progress Notes (Signed)
Subjective: Benjamin Cardenas presents today for follow up of chronic, painful mycotic toenails and plantar calluses which interfere with daily activities and routine tasks.  Pain is aggravated when wearing enclosed shoe gear. Pain is getting progressively worse and relieved with periodic professional debridement.   Isaac Bliss, Rayford Halsted, MD is his PCP. Last visit was 09/29/2018.   Current Outpatient Medications:  .  alfuzosin (UROXATRAL) 10 MG 24 hr tablet, Take 10 mg by mouth daily with breakfast., Disp: , Rfl:  .  aspirin EC 81 MG tablet, Take 81 mg by mouth daily. , Disp: , Rfl:  .  cetirizine (ZYRTEC) 10 MG tablet, Take 10 mg by mouth daily. , Disp: , Rfl:  .  finasteride (PROSCAR) 5 MG tablet, Take 5 mg by mouth daily. , Disp: , Rfl:  .  iron polysaccharides (NIFEREX) 150 MG capsule, Take 1 capsule (150 mg total) by mouth daily., Disp: 30 capsule, Rfl: 0 .  naphazoline-pheniramine (NAPHCON-A) 0.025-0.3 % ophthalmic solution, Place 1 drop into both eyes 2 (two) times daily., Disp: , Rfl:  .  ondansetron (ZOFRAN) 4 MG tablet, Take 1 tablet (4 mg total) by mouth every 6 (six) hours as needed for nausea., Disp: 20 tablet, Rfl: 0 .  protein supplement shake (PREMIER PROTEIN) LIQD, Take 2 oz by mouth daily as needed (food supplement). Add to PET butter pecan ice cream for calorie/protein supplement., Disp: , Rfl:  .  TRULANCE 3 MG TABS, Take 3 mg by mouth every morning. , Disp: , Rfl:  .  vitamin B-12 1000 MCG tablet, Take 1 tablet (1,000 mcg total) by mouth daily., Disp: 30 tablet, Rfl: 0  Allergies  Allergen Reactions  . Amoxicillin Rash    Has patient had a PCN reaction causing immediate rash, facial/tongue/throat swelling, SOB or lightheadedness with hypotension:No Has patient had a PCN reaction causing severe rash involving mucus membranes or skin necrosis:unsure Has patient had a PCN reaction that required hospitalization:No Has patient had a PCN reaction occurring within the last 10  years:unsure If all of the above answers are "NO", then may proceed with Cephalosporin use.   . Ciprofloxacin Swelling and Rash  . Sulfasalazine Swelling  . Sulfa Antibiotics Swelling  . Doxycycline Hyclate Rash  . Levofloxacin Rash    Objective: There were no vitals filed for this visit.  Vascular Examination: Capillary refill time <3 seconds x 10 digits.  Dorsalis pedis pulses present b/l.  Posterior tibial pulses present b/l.  No digital hair x 10 digits.  Skin temperature WNL b/l.  Dermatological Examination: Skin with normal turgor, texture and tone b/l.  Toenails 1-5 b/l discolored, thick, dystrophic with subungual debris and pain with palpation to nailbeds due to thickness of nails.  Porokeratotic lesion(s) submet heads 1, 5 b/l. Hyperkeratotic lesions b/l hallux. No erythema, no edema, no drainage, no flocculence noted.   Musculoskeletal: Muscle strength 5/5 to all LE muscle groups.  Neurological: Sensation intact with 10 gram monofilament.  Vibratory sensation diminished b/l.  Assessment: 1. Painful onychomycosis toenails 1-5 b/l 2.   Peripheral neuropathy  Plan: 1. Toenails 1-5 b/l were debrided in length and girth without iatrogenic bleeding. 2. Calluses pared b/l hallux and porokeratoses pared submet heads 1, 5 b/l utilizing sterile scalpel blade without incident. Corn(s) pared utilizing sterile scalpel blade without incident.  3. Patient to continue soft, supportive shoe gear daily. 4. Patient to report any pedal injuries to medical professional immediately. 5. Follow up 10 weeks. 6. Patient/POA to call should there be a concern  in the interim.

## 2018-10-31 DIAGNOSIS — N130 Hydronephrosis with ureteropelvic junction obstruction: Secondary | ICD-10-CM | POA: Diagnosis not present

## 2018-10-31 DIAGNOSIS — R8279 Other abnormal findings on microbiological examination of urine: Secondary | ICD-10-CM | POA: Diagnosis not present

## 2018-11-03 ENCOUNTER — Encounter (HOSPITAL_BASED_OUTPATIENT_CLINIC_OR_DEPARTMENT_OTHER): Payer: Self-pay | Admitting: *Deleted

## 2018-11-03 ENCOUNTER — Telehealth: Payer: Self-pay | Admitting: *Deleted

## 2018-11-03 ENCOUNTER — Other Ambulatory Visit: Payer: Self-pay

## 2018-11-03 ENCOUNTER — Other Ambulatory Visit (HOSPITAL_COMMUNITY)
Admission: RE | Admit: 2018-11-03 | Discharge: 2018-11-03 | Disposition: A | Discharge status: Home / Self Care | Payer: Medicare Other | Source: Ambulatory Visit | Attending: Urology | Admitting: Urology

## 2018-11-03 DIAGNOSIS — Z20828 Contact with and (suspected) exposure to other viral communicable diseases: Secondary | ICD-10-CM | POA: Diagnosis not present

## 2018-11-03 LAB — SARS CORONAVIRUS 2 (TAT 6-24 HRS): SARS Coronavirus 2: NEGATIVE

## 2018-11-03 NOTE — Telephone Encounter (Signed)
Copied from [place] [id number]. Topic: General - Other >> Nov 02, 2018  3:51 PM Nils Flack, [name] wrote: Reason for CRM: wanda from [facility] called - she is following up on orders that were faxed over multiple times.  Please call her back at [phone] and her direct fax is [phone]

## 2018-11-03 NOTE — Telephone Encounter (Signed)
Placed in Dr Hernandez's folder 

## 2018-11-03 NOTE — Progress Notes (Signed)
Spoke w/ pt and pt wife via phone for pre-op interview.  Npo after mn.  Arrive at 0730.  Current ekg in chart and epic.  Pt had covid test done today.  Will take zyrtec am dos w/ sips of water.  Reviewed [facility] guidelines and visitor restriction policy.  Asked pt to bring home meds.

## 2018-11-04 NOTE — Telephone Encounter (Signed)
Forms faxed

## 2018-11-06 NOTE — Discharge Instructions (Signed)

## 2018-11-06 NOTE — H&P (Signed)
HPI: Benjamin Cardenas is a 85 year-old male with a history of bladder cancer and now with urinary retention.  His problem was discovered 08/13/2018. His current symptoms did not begin after he had a surgical procedure. He does not currently have an indwelling catheter. His urinary retention is being treated with uroxatrol and proscar.   08/23/18: He was admitted the hospital on 08/13/18 with a creatinine of 2.29 and urine that appeared infected however his urine culture was negative. A CT scan revealed severe bilateral hydronephrosis. A Foley catheter was placed and he experienced a post obstructive diuresis. He had been taking alfuzosin that was stopped when he had to have cataract surgery unfortunately, after his cataract surgery, it was not restarted. He is back on alfuzosin and tamsulosin was added during his hospitalization. With Foley catheter drainage his creatinine returned to his baseline.  His wife indicated that for several months prior to his admission to the hospital he was experiencing nocturnal enuresis and she had the set the alarm to get him up every 2 hours to urinate with sounds like he may have had some overflow type incontinence back then. Is currently on Keflex.   09/20/18: He returns today for repeat voiding trial on finasteride and alfuzosin.   09/29/18: On 09/20/18 he ended up being admitted to the hospital because he was unable to urinate was having significant suprapubic discomfort and was found to be febrile, tachycardic and hypotensive with a WBC of 14.5 and a urinalysis that was concerning for infection. A Foley catheter was inserted and a urine culture was performed that grew multiple species. A repeat culture was negative. Blood cultures were negative.     CC: I have hydronephrosis.  HPI: His problem was discovered 08/23/2018. [name] He had the following imaging studies done: Renal Ultrasound, CT Scan, and MRI Scan.   He is currently having groin pain. He denies having flank pain,  back pain, nausea, vomiting, fever, and chills.   He has not had kidney surgery. He has not had a stent placed in his kidney. He has not received radiation therapy.   09/20/18. On 08/13/18 a CT scan revealed severe bilateral hydronephrosis with some degree of bladder distention at the time. He since had a Foley catheter indwelling and the on 09/20/18 he underwent an MRI scan of the spine which revealed persistent right hydronephrosis with some left hydronephrosis. The ureters are dilated down the level of the bladder on his CT scan.   09/29/18: At his last visit I checked his creatinine and despite the fact that he had right hydronephrosis with some mild left hydronephrosis his creatinine was found to be normal at 0.9. A renal ultrasound on 09/22/18 revealed mild bilateral hydronephrosis that appeared, when compared to his previous CT scan and MRI scans, seemed less and his creatinine remains stable at 1.03.     CC: I had bladder cancer that has been treated.  HPI: His bladder cancer was superficial and limitied to the bladder lining.   He did have a TURBT. His last bladder tumor was resected 02/16/2015. He has had the following number of bladder resections: 1. He had treatment with the following intravesical agents: BCG and Mitomycin. Patient denies Interferon, Adriamycin, Epirubicin, and Gemcitabine.   He has not had blood in his urine recently. The patient has developed frequency and urgency. He is not having new bone pain. He has not recently had unwanted weight loss.   His last cysto was 02/22/2018.   This condition would be considered  of mild to moderate severity with no modifying factors or associated signs or symptoms other than as noted above.   09/29/18: He it has been a little over 6 months since his last cystoscopy. I have been holding off to see if I could get him to urinate spontaneously but he keeps having difficulty with urinary retention. He is due for his surveillance cystoscopy  today.     ALLERGIES: Amoxicillin TABS Cipro TABS Doxy-Caps CAPS Levaquin TABS Sulfa Drugs Zithromax CAPS    MEDICATIONS: Finasteride 5 mg tablet 1 tablet PO Daily  Keflex 500 mg capsule  Pyridium 200 mg tablet 1 tablet PO Q 8 H  Uroxatral 10 mg tablet, extended release 24 hr 1 tablet PO Daily  Zyrtec  Alfuzosin Hcl Er 10 mg tablet, extended release 24 hr  Aspirin 81 MG TABS Oral  Benefiber 1 gram tablet Oral  Eye Drops  Fexofenadine Hcl 180 mg tablet Oral  Tramadol Hcl  Trulance     GU PSH: Bladder Instill AntiCA Agent - 2017, 2017, 2017, 2016 Cysto Bladder Ureth Biopsy - 2017 Cystoscopy - 02/22/2018, 08/17/2017, 03/03/2017, 12/01/2016, 2018, 2018, 2017, 2017 Cystoscopy TURBT 2-5 cm - 2016 Cystoscopy Ureteroscopy - 2016       PSH Notes: Cystoscopy With Biopsy, Cystoscopy With Ureteroscopy Left, Bladder Injection Of Cancer Treatment, Cystoscopy With Fulguration Medium Lesion (2-5cm), Hernia Repair   NON-GU PSH: Hernia Repair - 2013     GU PMH: Hydronephrosis, Bilateral, He has persistent right hydronephrosis on his MRI scan. He has been having pain but I do not think that this hydronephrosis has anything to do with his pain. I do need to check a creatinine. - 09/20/2018 Urinary Retention (Stable), He was having too many bladder spasms to fill the bladder enough to perform an adequate voiding trial so his catheter was removed and he is going to force fluids today and return if he is unable to urinate. - 09/20/2018, (Stable), He will return in 1 month for a repeat voiding trial., - 08/23/2018 History of bladder cancer (Stable), I am going to hold off on cystoscopy until I can see if I can render him catheter free. I may have to proceed with cystoscopy when he returns if he does not begin voiding spontaneously. - 08/23/2018, I again found no evidence of recurrent transitional cell carcinoma the bladder. Urine will be sent for cytology and I will tentatively plan to see him back again  in 6 months for surveillance cystoscopy., - 02/22/2018 (Stable), He again had no evidence of recurrent transitional cell carcinoma the bladder. I will continue surveillance., - 08/17/2017 (Stable), He had no evidence of recurrent papillary transitional cell carcinoma of the bladder. It has now been 2 years since I made his diagnosis and he has not had recurrence. He therefore can proceed with surveillance cystoscopy every 6 months for the next 2 years., - 03/03/2017 (Stable), I noted no evidence of recurrent transitional cell carcinoma of the bladder. The area on the floor the bladder appears unchanged. I will send urine for cytology., - 12/01/2016 (Stable), I again note that the area on the posterior wall bladder where his tumor was previously resected appear slightly erythematous and inflamed and we discussed the difficulty determining the difference between cancer and inflammation so I sent a urine cytology at his last visit and that was negative. I will repeat a urine cytology today and tentatively plan to see him back again in 3 months for repeat surveillance cystoscopy., - 2018 (Stable), He had no  evidence of papillary lesions within the bladder but there was still an area of erythema near where his previous bladder tumor was resected and I can't tell if this is just inflammation which it appears to be. Rather than repeat another biopsy at this point I'm going to send his urine for cytology and make a decision based on those results. I will otherwise tentatively plan to see him back in 3 months for repeat surveillance cystoscopy., - 2018 (Stable), I found no evidence of recurrent transitional cell carcinoma of the bladder today. He did have an area of inflammation on the left wall. I think this is most likely what is causing him of his voiding symptoms. I'm not going to treat him with any further intravesical BCG at this time., - 2017 (Stable), He is due for repeat surveillance cystoscopy blut I do not want to  proceed with cystoscopy in the presence of possible infection so I will bring him back once his culture has shown no evidence of infection or any infection present has been treated., - 2017 (Stable), He has undergone a 6 week induction course of and he had no side effects. He is now having symptoms after his first treatment with a negative culture so I have recommended using half-strength BCG for his remaining 2 treatments. In addition he is going to stop taking the antibiotics that I have prescribed 24 hours prior to his next intravesical BCG treatment in order to not diminish its effectiveness., - 2017 (Stable), He had no evidence of recurrent transitional cell carcinoma of the bladder today. He will receive BCG weekly for 3 weeks for maintenance and return in 3 months for repeat cystoscopy and maintenance BCG., - 2017 BPH w/LUTS (Stable), He had marked BPH by exam. I did not notice any worrisome nodularity or induration and there is no fluctuance or. - 05/02/2018, Incomplete emptying of bladder due to benign prostatic hyperplasia, - 2016, Benign prostatic hyperplasia with urinary obstruction, - 2016 Low back pain, His low back pain seems to be musculoskeletal. It may be in some way related to the exercising that he has been doing recently. I have reassured him with respect to this. - 05/02/2018 Urinary Urgency, He reported to me today that he has been having some urgency and some frequency and has had a couple of episodes of nocturnal enuresis. I told him I need to rule out urinary tract infection the. Effects negative we could consider a trial of Myrbetriq. - 02/22/2018 Chronic cystitis (w/o hematuria) (Stable), He was told he had a UTI in the ER but no culture was done. I told him I would culture his urine today because there was some pyuria. I suspect this may be due to his previous bladder surgeries and the presence of some dystrophic calcification. - 2017 Urinary Frequency (Worsening) - 2017 Dysuria  (Worsening), We discussed the possible etiologies for his voiding symptoms. I first discussed the fact that BCG results in an immune response and that no immune response often portends a decreased effectiveness of the medication so this is not particularly a bad thing. His urine culture was negative and he saw some improvement with alfuzosin. We also discussed BCG prostatitis as another possible cause and the fact that longer courses of antibiotics are typically necessary for prostatitis. Either way it sounds like there is also an inflammatory component so I have recommended nonsteroidal anti-inflammatory medication as opposed to a steroid. - 2017, - 2017 Bladder, Neoplasm of Unspecified behavior, Bladder neoplasm - 2017 Elevated PSA,  Elevated prostate specific antigen (PSA) - 2016 Hydronephrosis Unspec, Hydronephrosis, left - 2016 Hematospermia, Hematospermia - 2015 Inflammatory Disease Prostate, Unspec, Prostatitis - 2015 Neoplasm unspecified behavior of other genitourinary organ, Testicular neoplasm - 2014 Prostate nodule w/o LUTS, Nodular prostate without lower urinary tract symptoms - 2014 Prostate Stones, Calculus of prostate - 2014      PMH Notes: Hematospermia: He has a history of having had hemospermia in the past but that was felt likely secondary to some prostate calcification that I had noted on transrectal ultrasound.    BPH with bladder outlet obstruction: He does have some slowing of his urinary stream, but only gets up once at night, has mild intermittency, no dysuria or hematuria.    Elevated PSA and prostate nodule: He has a known prostate nodule that he has had for some time. It is subtle and has remained unchanged over the years. It is in the right apex and essentially has the same consistency as the rest of the prostate. His PSA remains just above the normal range and fluctuates slightly to within the normal range but essentially has remained stable over the years.     Transitional cell carcinoma of the bladder: He experienced gross hematuria that was not associated with flank pain or voiding symptoms. He takes a daily aspirin and has smoked in the past.  CT scan 02/05/15 - Left hydronephrosis down to the bladder.  TURBT 02/25/15 + Mitomycin-C  Pathology: High-grade, transitional cell carcinoma with invasion through the muscularis mucosa but not into the muscularis propria.  Stage: T1,G3  Repeat bladder biopsy 04/22/15: No further obvious lesions were found within the bladder. I obtained biopsies from the base of the tumor and also at the edge to make sure it had been completely resected.  Pathology: Inflammation and benign urothelium with muscularis present and negative.  Treatment: 6 week induction course of BCG completed 3/17     NON-GU PMH: Pyuria/other UA findings (Stable), He has a history of chronic pyuria with the appearance of an infection however cultures have been negative in the past. I will culture his urine today. - 02/22/2018, He had pyuria today and has had some hematuria but had no evidence of recurrent bladder cancer so I will culture his urine., - 08/17/2017 (Stable), I noted pyuria today but no bacteriuria and his urine was nitrite negative. His urine will be sent for cytology but I do not feel culture is warranted., - 03/03/2017, He did have some pyuria and bacteriuria and because he had reported experiencing some mild dysuria I will culture his urine., - 12/01/2016, He did have some pyuria and bacteriuria so we will culture his urine as well today to rule out infection., - 2018, He did have some white cells and bacteria in his urine today. Because of his irritative symptoms I am going to send his urine for culture., - 2018 (Stable), He did have some mild pyuria. He did not have anything to suggest infection and has no symptoms. I think this is due to the inflammation seen in his bladder., - 2017 Encounter for other specified prophylactic  measures, He will receive full strength BCG today in weekly x3 weeks. - 2017 Encounter for general adult medical examination without abnormal findings, Encounter for preventive health examination - 2015    FAMILY HISTORY: Colon Cancer - [place] Status Number - Runs In Family Heart Disease - Father, Brother, Sister   SOCIAL HISTORY: Marital Status: Married Preferred Language: English; Ethnicity: Not Hispanic Or Latino; Race:  White Current Smoking Status: Patient does not smoke anymore. Smoked for 20 years.  Drinks 1 drink per week. Types of alcohol consumed: Beer. Social Drinker.  Drinks 2 caffeinated drinks per day.     Notes: Former smoker, Occupation:, Alcohol Use, Caffeine Use, Tobacco Use, Marital History - Currently Married   REVIEW OF SYSTEMS:    GU Review Male:   Patient denies frequent urination, hard to postpone urination, burning/ pain with urination, get up at night to urinate, leakage of urine, stream starts and stops, trouble starting your stream, have to strain to urinate , erection problems, and penile pain.  Gastrointestinal (Upper):   Patient denies nausea, vomiting, and indigestion/ heartburn.  Gastrointestinal (Lower):   Patient denies diarrhea and constipation.  Constitutional:   Patient denies fever, night sweats, weight loss, and fatigue.  Skin:   Patient denies skin rash/ lesion and itching.  Eyes:   Patient denies blurred vision and double vision.  Ears/ Nose/ Throat:   Patient denies sore throat and sinus problems.  Hematologic/Lymphatic:   Patient denies swollen glands and easy bruising.  Cardiovascular:   Patient denies leg swelling and chest pains.  Respiratory:   Patient denies cough and shortness of breath.  Endocrine:   Patient denies excessive thirst.  Musculoskeletal:   Patient denies back pain and joint pain.  Neurological:   Patient denies headaches and dizziness.  Psychologic:   Patient denies depression and anxiety.   VITAL SIGNS:     Weight 146 lb / 66.22 kg  Height 66 in / 167.64 cm  BP 95/59 mmHg  Pulse 81 /min  Temperature 97.3 F / 36.2 C  BMI 23.6 kg/m   GU PHYSICAL EXAMINATION:    Anus and Perineum: No hemorrhoids. No anal stenosis. No rectal fissure, no anal fissure. No edema, no dimple, no perineal tenderness, no anal tenderness.  Scrotum: No lesions. No edema. No cysts. No warts.  Epididymides: Right: no spermatocele, no masses, no cysts, no tenderness, no induration, no enlargement. Left: no spermatocele, no masses, no cysts, no tenderness, no induration, no enlargement.  Testes: No tenderness, no swelling, no enlargement left testes. No tenderness, no swelling, no enlargement right testes. Normal location left testes. Normal location right testes. No mass, no cyst, no varicocele, no hydrocele left testes. No mass, no cyst, no varicocele, no hydrocele right testes.  Urethral Meatus: Normal size. No lesion, no wart, no discharge, no polyp. Normal location.  Penis: Circumcised, no warts, no cracks. No dorsal Peyronie's plaques, no left corporal Peyronie's plaques, no right corporal Peyronie's plaques, no scarring, no warts. No balanitis, no meatal stenosis.  Prostate: Prostate 4 + size. Left lobe normal consistency, right lobe normal consistency. Symmetrical lobes. No prostate nodule. Left lobe no tenderness, right lobe no tenderness.   Seminal Vesicles: Nonpalpable.  Sphincter Tone: Normal sphincter. No rectal tenderness. No rectal mass.      MULTI-SYSTEM PHYSICAL EXAMINATION:    Constitutional: Well-nourished, thin. No physical deformities. Normally developed. Good grooming. Frail. Neck: Neck symmetrical, not swollen. Normal tracheal position.  Respiratory: No labored breathing, no use of accessory muscles.   Cardiovascular: Normal temperature, normal extremity pulses, no swelling, no varicosities.  Lymphatic: No enlargement of neck, axillae, groin.  Skin: No paleness, no jaundice, no cyanosis. No lesion, no  ulcer, no rash.  Neurologic / Psychiatric: Oriented to time, oriented to place, oriented to person. No depression, no anxiety, no agitation.  Gastrointestinal: No mass, no tenderness, no rigidity, non obese abdomen.  Eyes: Normal conjunctivae. Normal eyelids.  Ears, Nose, Mouth, and Throat: Left ear no scars, no lesions, no masses. Right ear no scars, no lesions, no masses. Nose no scars, no lesions, no masses. Normal hearing. Normal lips.  Musculoskeletal: Normal gait and station of head and neck.         PAST DATA REVIEWED:  Source Of History:  Patient  Lab Test Review:   BUN/Creatinine, WBC  Records Review:   Previous Hospital Records, Previous Patient Records  Urine Test Review:   Urinalysis, Urine Culture  X-Ray Review: Renal Ultrasound: Reviewed Films. Reviewed Report.  C.T. Abdomen/Pelvis: Reviewed Films.  MRI Lumbar Spine: Reviewed Films.     01/30/15 10/03/13 10/27/11 10/23/10 11/22/08 02/21/08 11/10/07 02/17/07  PSA  Total PSA 3.93  4.43  3.08  3.54  4.05  3.36  4.14  3.08   Free PSA  1.12    0.74   0.94    % Free PSA  25    18.3   22.7      PROCEDURES:         Flexible Cystoscopy findings - On the posterior wall there appeared to be some form of fibrinous material whether this was due to the indwelling Foley catheter or is recurrent bladder cancer is very difficult to tell.    ASSESSMENT/PLAN:      ICD-10 Details  1 GU:   Oth hydronephrosis - N13.39 Bilateral, Improving - His hydronephrosis is improving based on his most recent renal ultrasound and he has a normal creatinine. I will continue to monitor this.  2   Urinary Retention - R33.8 Stable - He we are going to manage his urinary retention right now with a Foley catheter. I will then plan to proceed with transurethral resection of the prostate. His pre-op urine culture was negative. 3   History of bladder cancer - Z85.51 Worsening - With his history of bladder cancer and now a lesion within the bladder I told him that  it could be related to his catheter however I and reluctant to blame his catheter for an abnormality in the bladder and therefore have recommended we proceed with a biopsy.          Notes:   We 1st discussed the fact that he has failed multiple voiding trials. Since he was voiding without difficulty before this began we discussed various procedures for outlet resistance management including continued Foley catheterization. We also discussed minimally invasive procedures such as a Urolift. We also discussed transurethral resection of his prostate. He has an area in the bladder that needs to be biopsied and therefore because he is going to be under anesthesia I have discussed proceeding with a transurethral resection of his prostate. I went over the procedure with him in detail today. We discussed the risks and complications and the probability of success as well as the need for an overnight stay in the hospital. We also discussed the anticipated postoperative course and he has elected to proceed.

## 2018-11-07 ENCOUNTER — Ambulatory Visit (HOSPITAL_BASED_OUTPATIENT_CLINIC_OR_DEPARTMENT_OTHER)
Admission: RE | Admit: 2018-11-07 | Discharge: 2018-11-08 | Disposition: A | Discharge status: Home / Self Care | Payer: Medicare Other | Attending: Urology | Admitting: Urology

## 2018-11-07 ENCOUNTER — Other Ambulatory Visit: Payer: Self-pay

## 2018-11-07 ENCOUNTER — Encounter (HOSPITAL_BASED_OUTPATIENT_CLINIC_OR_DEPARTMENT_OTHER)
Admission: RE | Disposition: A | Discharge status: Home / Self Care | Payer: Self-pay | Source: Home / Self Care | Attending: Urology

## 2018-11-07 ENCOUNTER — Encounter (HOSPITAL_BASED_OUTPATIENT_CLINIC_OR_DEPARTMENT_OTHER): Payer: Self-pay | Admitting: Certified Registered Nurse Anesthetist

## 2018-11-07 ENCOUNTER — Ambulatory Visit (HOSPITAL_BASED_OUTPATIENT_CLINIC_OR_DEPARTMENT_OTHER): Payer: Medicare Other | Admitting: Anesthesiology

## 2018-11-07 DIAGNOSIS — Z8551 Personal history of malignant neoplasm of bladder: Secondary | ICD-10-CM | POA: Diagnosis not present

## 2018-11-07 DIAGNOSIS — Z87891 Personal history of nicotine dependence: Secondary | ICD-10-CM | POA: Diagnosis not present

## 2018-11-07 DIAGNOSIS — Z7982 Long term (current) use of aspirin: Secondary | ICD-10-CM | POA: Diagnosis not present

## 2018-11-07 DIAGNOSIS — I781 Nevus, non-neoplastic: Secondary | ICD-10-CM | POA: Insufficient documentation

## 2018-11-07 DIAGNOSIS — Z880 Allergy status to penicillin: Secondary | ICD-10-CM | POA: Insufficient documentation

## 2018-11-07 DIAGNOSIS — N3944 Nocturnal enuresis: Secondary | ICD-10-CM | POA: Insufficient documentation

## 2018-11-07 DIAGNOSIS — E871 Hypo-osmolality and hyponatremia: Secondary | ICD-10-CM | POA: Diagnosis not present

## 2018-11-07 DIAGNOSIS — N1330 Unspecified hydronephrosis: Secondary | ICD-10-CM | POA: Diagnosis not present

## 2018-11-07 DIAGNOSIS — Z881 Allergy status to other antibiotic agents status: Secondary | ICD-10-CM | POA: Insufficient documentation

## 2018-11-07 DIAGNOSIS — R338 Other retention of urine: Secondary | ICD-10-CM | POA: Insufficient documentation

## 2018-11-07 DIAGNOSIS — Z882 Allergy status to sulfonamides status: Secondary | ICD-10-CM | POA: Insufficient documentation

## 2018-11-07 DIAGNOSIS — N401 Enlarged prostate with lower urinary tract symptoms: Secondary | ICD-10-CM | POA: Diagnosis not present

## 2018-11-07 DIAGNOSIS — Z79891 Long term (current) use of opiate analgesic: Secondary | ICD-10-CM | POA: Insufficient documentation

## 2018-11-07 DIAGNOSIS — Z79899 Other long term (current) drug therapy: Secondary | ICD-10-CM | POA: Diagnosis not present

## 2018-11-07 DIAGNOSIS — N3010 Interstitial cystitis (chronic) without hematuria: Secondary | ICD-10-CM | POA: Diagnosis not present

## 2018-11-07 DIAGNOSIS — N3289 Other specified disorders of bladder: Secondary | ICD-10-CM | POA: Diagnosis not present

## 2018-11-07 DIAGNOSIS — N320 Bladder-neck obstruction: Secondary | ICD-10-CM | POA: Diagnosis not present

## 2018-11-07 DIAGNOSIS — N138 Other obstructive and reflux uropathy: Secondary | ICD-10-CM | POA: Insufficient documentation

## 2018-11-07 HISTORY — DX: Iron deficiency anemia, unspecified: D50.9

## 2018-11-07 HISTORY — DX: Personal history of other infectious and parasitic diseases: Z86.19

## 2018-11-07 HISTORY — DX: Spinal stenosis, lumbar region without neurogenic claudication: M48.061

## 2018-11-07 HISTORY — DX: Other constipation: K59.09

## 2018-11-07 HISTORY — DX: Personal history of other diseases of the circulatory system: Z86.79

## 2018-11-07 HISTORY — DX: Other chronic pain: G89.29

## 2018-11-07 HISTORY — DX: Low back pain, unspecified: M54.50

## 2018-11-07 HISTORY — PX: TRANSURETHRAL RESECTION OF PROSTATE: SHX73

## 2018-11-07 HISTORY — DX: Personal history of malignant neoplasm of bladder: Z85.51

## 2018-11-07 HISTORY — DX: Retention of urine, unspecified: R33.9

## 2018-11-07 HISTORY — DX: Other symptoms and signs involving cognitive functions and awareness: R41.89

## 2018-11-07 HISTORY — DX: Presence of other specified devices: Z97.8

## 2018-11-07 SURGERY — TURP (TRANSURETHRAL RESECTION OF PROSTATE)
Anesthesia: General | Site: Bladder

## 2018-11-07 MED ORDER — ACETAMINOPHEN 500 MG PO TABS
ORAL_TABLET | ORAL | Status: AC
  Filled 2018-11-07: qty 2

## 2018-11-07 MED ORDER — FENTANYL CITRATE (PF) 100 MCG/2ML IJ SOLN
INTRAMUSCULAR | Status: AC
  Filled 2018-11-07: qty 2

## 2018-11-07 MED ORDER — FENTANYL CITRATE (PF) 100 MCG/2ML IJ SOLN
25.0000 ug | INTRAMUSCULAR | Status: DC | PRN
  Administered 2018-11-07: 25 ug via INTRAVENOUS
  Filled 2018-11-07: qty 1

## 2018-11-07 MED ORDER — ACETAMINOPHEN 325 MG PO TABS
ORAL_TABLET | ORAL | Status: AC
  Filled 2018-11-07: qty 2

## 2018-11-07 MED ORDER — SODIUM CHLORIDE 0.9 % IV SOLN
INTRAVENOUS | Status: DC
  Administered 2018-11-07: 21:00:00 via INTRAVENOUS
  Filled 2018-11-07 (×2): qty 1000

## 2018-11-07 MED ORDER — LIDOCAINE 2% (20 MG/ML) 5 ML SYRINGE
INTRAMUSCULAR | Status: AC
  Filled 2018-11-07: qty 5

## 2018-11-07 MED ORDER — DEXAMETHASONE SODIUM PHOSPHATE 10 MG/ML IJ SOLN
INTRAMUSCULAR | Status: AC
  Filled 2018-11-07: qty 1

## 2018-11-07 MED ORDER — ONDANSETRON HCL 4 MG/2ML IJ SOLN
4.0000 mg | INTRAMUSCULAR | Status: DC | PRN
  Filled 2018-11-07: qty 2

## 2018-11-07 MED ORDER — SODIUM CHLORIDE 0.9 % IR SOLN
3000.0000 mL | Status: DC
  Filled 2018-11-07: qty 3000

## 2018-11-07 MED ORDER — ONDANSETRON HCL 4 MG/2ML IJ SOLN
INTRAMUSCULAR | Status: AC
  Filled 2018-11-07: qty 2

## 2018-11-07 MED ORDER — ACETAMINOPHEN 500 MG PO TABS
1000.0000 mg | ORAL_TABLET | Freq: Once | ORAL | Status: AC
  Administered 2018-11-07: 1000 mg via ORAL
  Filled 2018-11-07: qty 2

## 2018-11-07 MED ORDER — LIDOCAINE HCL (CARDIAC) PF 100 MG/5ML IV SOSY
PREFILLED_SYRINGE | INTRAVENOUS | Status: DC | PRN
  Administered 2018-11-07: 60 mg via INTRAVENOUS

## 2018-11-07 MED ORDER — PIPERACILLIN-TAZOBACTAM 3.375 G IVPB 30 MIN
3.3750 g | Freq: Once | INTRAVENOUS | Status: AC
  Administered 2018-11-07: 3.375 g via INTRAVENOUS
  Filled 2018-11-07 (×2): qty 50

## 2018-11-07 MED ORDER — HYDROCODONE-ACETAMINOPHEN 5-325 MG PO TABS
1.0000 | ORAL_TABLET | ORAL | Status: DC | PRN
  Administered 2018-11-08: 1 via ORAL
  Filled 2018-11-07: qty 2

## 2018-11-07 MED ORDER — PROPOFOL 10 MG/ML IV BOLUS
INTRAVENOUS | Status: DC | PRN
  Administered 2018-11-07: 50 mg via INTRAVENOUS
  Administered 2018-11-07: 20 mg via INTRAVENOUS
  Administered 2018-11-07: 80 mg via INTRAVENOUS
  Administered 2018-11-07: 50 mg via INTRAVENOUS

## 2018-11-07 MED ORDER — DEXAMETHASONE SODIUM PHOSPHATE 10 MG/ML IJ SOLN
INTRAMUSCULAR | Status: DC | PRN
  Administered 2018-11-07: 5 mg via INTRAVENOUS

## 2018-11-07 MED ORDER — PROPOFOL 10 MG/ML IV BOLUS
INTRAVENOUS | Status: AC
  Filled 2018-11-07: qty 20

## 2018-11-07 MED ORDER — BELLADONNA ALKALOIDS-OPIUM 16.2-60 MG RE SUPP
1.0000 | Freq: Four times a day (QID) | RECTAL | Status: DC | PRN
  Filled 2018-11-07: qty 1

## 2018-11-07 MED ORDER — ONDANSETRON HCL 4 MG/2ML IJ SOLN
4.0000 mg | Freq: Once | INTRAMUSCULAR | Status: DC | PRN
  Filled 2018-11-07: qty 2

## 2018-11-07 MED ORDER — LACTATED RINGERS IV SOLN
INTRAVENOUS | Status: DC
  Administered 2018-11-07: 1000 mL via INTRAVENOUS
  Filled 2018-11-07 (×2): qty 1000

## 2018-11-07 MED ORDER — ACETAMINOPHEN 325 MG PO TABS
650.0000 mg | ORAL_TABLET | ORAL | Status: DC | PRN
  Administered 2018-11-07: 650 mg via ORAL
  Filled 2018-11-07: qty 2

## 2018-11-07 MED ORDER — ONDANSETRON HCL 4 MG/2ML IJ SOLN
INTRAMUSCULAR | Status: DC | PRN
  Administered 2018-11-07: 4 mg via INTRAVENOUS

## 2018-11-07 MED ORDER — BACITRACIN-NEOMYCIN-POLYMYXIN 400-5-5000 EX OINT
1.0000 "application " | TOPICAL_OINTMENT | Freq: Three times a day (TID) | CUTANEOUS | Status: DC | PRN
  Filled 2018-11-07: qty 1

## 2018-11-07 MED ORDER — PIPERACILLIN-TAZOBACTAM 3.375 G IVPB 30 MIN
3.3750 g | Freq: Three times a day (TID) | INTRAVENOUS | Status: AC
  Administered 2018-11-08: 3.375 g via INTRAVENOUS
  Filled 2018-11-07 (×2): qty 50

## 2018-11-07 SURGICAL SUPPLY — 30 items
BAG DRAIN URO-CYSTO SKYTR STRL (DRAIN) ×2 IMPLANT
BAG DRN ANRFLXCHMBR STRAP LEK (BAG) ×1
BAG DRN UROCATH (DRAIN) ×1
BAG URINE DRAINAGE (UROLOGICAL SUPPLIES) ×1 IMPLANT
BAG URINE LEG 19OZ MD ST LTX (BAG) ×1 IMPLANT
CATH FOLEY 3WAY 20FR (CATHETERS) ×1 IMPLANT
CATH FOLEY 3WAY 30CC 20FR (CATHETERS) IMPLANT
CATH FOLEY 3WAY 30CC 24FR (CATHETERS)
CATH HEMA 3WAY 30CC 24FR COUDE (CATHETERS) IMPLANT
CATH HEMA 3WAY 30CC 24FR RND (CATHETERS) IMPLANT
CATH URTH STD 24FR FL 3W 2 (CATHETERS) IMPLANT
CLOTH BEACON ORANGE TIMEOUT ST (SAFETY) ×2 IMPLANT
ELECT REM PT RETURN 9FT ADLT (ELECTROSURGICAL)
ELECTRODE REM PT RTRN 9FT ADLT (ELECTROSURGICAL) IMPLANT
EVACUATOR MICROVAS BLADDER (UROLOGICAL SUPPLIES) ×2 IMPLANT
GLOVE BIO SURGEON STRL SZ8 (GLOVE) ×2 IMPLANT
GOWN STRL REUS W/TWL LRG LVL3 (GOWN DISPOSABLE) ×1 IMPLANT
GOWN STRL REUS W/TWL XL LVL3 (GOWN DISPOSABLE) ×2 IMPLANT
HOLDER FOLEY CATH W/STRAP (MISCELLANEOUS) ×1 IMPLANT
IV NS IRRIG 3000ML ARTHROMATIC (IV SOLUTION) ×4 IMPLANT
KIT TURNOVER CYSTO (KITS) ×2 IMPLANT
LOOP CUT BIPOLAR 24F LRG (ELECTROSURGICAL) ×1 IMPLANT
MANIFOLD NEPTUNE II (INSTRUMENTS) ×2 IMPLANT
PACK CYSTO (CUSTOM PROCEDURE TRAY) ×2 IMPLANT
PLUG CATH AND CAP STER (CATHETERS) ×1 IMPLANT
SYR 30ML LL (SYRINGE) ×1 IMPLANT
SYRINGE IRR TOOMEY STRL 70CC (SYRINGE) IMPLANT
TUBE CONNECTING 12X1/4 (SUCTIONS) ×1 IMPLANT
TUBING UROLOGY SET (TUBING) ×1 IMPLANT
WATER STERILE IRR 3000ML UROMA (IV SOLUTION) IMPLANT

## 2018-11-07 NOTE — Telephone Encounter (Signed)
Benjamin Cardenas with Benjamin Cardenas stated the forms received for pt were not signed nor dated by Dr. Jerilee Hoh. Re-faxing forms currently and asking they be signed, dated and faxed back today as soon as possble as paperwork is out of Medicare compliance.  Direct Fax [phone]

## 2018-11-07 NOTE — Anesthesia Preprocedure Evaluation (Addendum)
Anesthesia Evaluation  Patient identified by MRN, date of birth, ID band Patient awake    Reviewed: Allergy & Precautions, NPO status , Patient's Chart, lab work & pertinent test results  Airway Mallampati: I  TM Distance: >3 FB Neck ROM: Full    Dental no notable dental hx.    Pulmonary former smoker,    Pulmonary exam normal breath sounds clear to auscultation       Cardiovascular + DOE  Normal cardiovascular exam Rhythm:Regular Rate:Normal  ECG: SR, rate 85   Neuro/Psych negative neurological ROS  negative psych ROS   GI/Hepatic negative GI ROS, Neg liver ROS,   Endo/Other  negative endocrine ROS  Renal/GU negative Renal ROS     Musculoskeletal  (+) Arthritis , Lumbar stenosis Chronic low back pain   Abdominal   Peds  Hematology negative hematology ROS (+)   Anesthesia Other Findings BENIGN PROSTATIC HYPERPLASIA WITH URINARY RETENTION,  HISTORY OF BLADDER CANCER  Reproductive/Obstetrics                            Anesthesia Physical Anesthesia Plan  ASA: II  Anesthesia Plan: General   Post-op Pain Management:    Induction: Intravenous  PONV Risk Score and Plan: 3 and Ondansetron, Dexamethasone and Treatment may vary due to age or medical condition  Airway Management Planned: LMA  Additional Equipment:   Intra-op Plan:   Post-operative Plan: Extubation in OR  Informed Consent: I have reviewed the patients History and Physical, chart, labs and discussed the procedure including the risks, benefits and alternatives for the proposed anesthesia with the patient or authorized representative who has indicated his/her understanding and acceptance.     Dental advisory given  Plan Discussed with: CRNA  Anesthesia Plan Comments:        Anesthesia Quick Evaluation

## 2018-11-07 NOTE — Progress Notes (Signed)
Reported patient stating that he's been getting short of breath over the last 3 weeks while standing and fixing dinner to Dr. Roanna Banning.

## 2018-11-07 NOTE — Anesthesia Procedure Notes (Signed)
Procedure Name: LMA Insertion Date/Time: 11/07/2018 8:41 AM Performed by: Raenette Rover, CRNA Pre-anesthesia Checklist: Patient identified, Emergency Drugs available, Suction available and Patient being monitored Patient Re-evaluated:Patient Re-evaluated prior to induction Oxygen Delivery Method: Circle system utilized Preoxygenation: Pre-oxygenation with 100% oxygen Induction Type: IV induction LMA: LMA inserted LMA Size: 4.0 Number of attempts: 1 Placement Confirmation: positive ETCO2,  CO2 detector and breath sounds checked- equal and bilateral Tube secured with: Tape Dental Injury: Teeth and Oropharynx as per pre-operative assessment

## 2018-11-07 NOTE — Op Note (Signed)
PATIENT:  Benjamin Cardenas  PRE-OPERATIVE DIAGNOSIS: 1.BPH with outlet obstruction 2.  History of transitional cell carcinoma the bladder. 3.  Urinary retention  POST-OPERATIVE DIAGNOSIS: Same  PROCEDURE: 1.  Bladder biopsy 2.  TURP  SURGEON:  Claybon Jabs  INDICATION: Benjamin Cardenas is a 85 year old male who developed urinary retention in 5/20.  He was maintained on alpha blockade therapy and had been on this preoperatively however he had begun to experience nocturnal enuresis with frequency and what sounded like possible overflow incontinence.  He was placed on finasteride as well and failed voiding trials.  In addition he has a history of bladder cancer with a T1, G3 lesion resected in 11/16 followed by repeat bladder biopsy which revealed no further tumor present.  He underwent a 6-week induction course of BCG was was completed in 3/17.  Since then he is undergone surveillance cystoscopy without evidence of recurrence. He underwent cystoscopy in my office and I did note some BPH with an area of irritation on the posterior wall of the bladder that was concerning although he had an indwelling Foley catheter for some time and I felt that this possibly could also represent irritation from the catheter but recommended a biopsy of the area and at the time because he had not been urinating we discussed proceeding with a transurethral resection of the prostate as well.  ANESTHESIA:  General  EBL:  Minimal  DRAINS: 30 [place], three-way Foley catheter  SPECIMEN: 1.  Bladder biopsies. 2.  Prostate chips to pathology  After informed consent the patient was brought to the major OR and placed on the table. He was administered general anesthesia and then moved to the dorsal lithotomy position. His genitalia was sterilely prepped and draped and an official timeout was then performed.  Initially the 28 French resectoscope sheath with the visual obturator was passed into the bladder and the obturator  removed. I then inserted the resectoscope element with 30 lens and  performed a systematic inspection of the bladder. I noted the bladder had mild trabeculation and what appeared to be very erythematous, friable mucosa involving the majority of the bladder.  There were no obvious papillary lesions identified.  I attempted to locate the right and left UO but was unsuccessful.  Withdrawing the scope into the prostatic urethra noted there was some median lobe and a high bladder neck with lateral lobe hypertrophy although the prostate was noted to be relatively small overall.  I inserted the resectoscope element and obtained biopsies from 3 representative locations in the bladder including the posterior wall, the floor of the bladder and the left wall of the bladder.  These were sent to pathology as bladder biopsies.  Resection was then begun I. first resected the median lobe in the midline down to the level of the bladder neck. I then began resecting the left lobe of the prostate by resecting first from the level of the bladder neck back to the level of the Veru at the 5:00 position and then progressed in a counterclockwise direction resecting all of the adenomatous tissue of the left lobe down to the surgical capsule. Bleeding points were cauterized as they were encountered. I then turned my attention to the right lobe of the prostate and it was resected in an identical fashion. Tissue in the area of the apex was then resected circumferentially with care being taken to maintain the resection proximal to the Veru at all times. The prostatic chips were then flushed into the bladder and  the Microvasive evacuator was then used to evacuate all chips from the bladder. Reinspection of the bladder revealed the mucosa to be intact although there was some areas of splitting that were fulgurated.  There were no prostatic chips remaining within the bladder. The prostatic capsule was intact throughout with no perforation and  there was no active bleeding noted at the end of the procedure.  The resectoscope was therefore removed and the 20 French three-way Foley catheter was then inserted and balloon was filled to 30 cc. This was placed on mild traction and the bladder was irrigated with the irrigant returning clear. The catheter was then hooked to closed system drainage and continuous irrigation and the patient was awakened and taken to recovery room in stable and satisfactory condition. He tolerated the procedure well and there were no intraoperative complications.  It appeared to me during his resection and with filling and emptying of the bladder he had a markedly diminished bladder capacity with what appeared to be a fairly noncompliant bladder which is concerning as this may prevent him from voiding spontaneously.  PLAN OF CARE: Observation overnight with anticipated discharge in the morning.  PATIENT DISPOSITION:  PACU - Hemodynamically stable.

## 2018-11-07 NOTE — Anesthesia Postprocedure Evaluation (Signed)
Anesthesia Post Note  Patient: Benjamin Cardenas  Procedure(s) Performed: TRANSURETHRAL RESECTION OF THE PROSTATE (TURP)/ BLADDER BIOPSY (N/A Bladder)     Patient location during evaluation: PACU Anesthesia Type: General Level of consciousness: awake Pain management: pain level controlled Vital Signs Assessment: post-procedure vital signs reviewed and stable Respiratory status: spontaneous breathing, nonlabored ventilation, respiratory function stable and patient connected to nasal cannula oxygen Cardiovascular status: blood pressure returned to baseline and stable Postop Assessment: no apparent nausea or vomiting Anesthetic complications: no    Last Vitals:  Vitals:   11/07/18 1015 11/07/18 1037  BP: 100/68 126/76  Pulse: (!) 52 (!) 59  Resp: 12 14  Temp:  (!) 36.1 C  SpO2: 97% 100%    Last Pain:  Vitals:   11/07/18 1230  TempSrc:   PainSc: 0-No pain                 Benjamin Cardenas

## 2018-11-07 NOTE — Transfer of Care (Signed)
Immediate Anesthesia Transfer of Care Note  Patient: Benjamin Cardenas  Procedure(s) Performed: TRANSURETHRAL RESECTION OF THE PROSTATE (TURP)/ BLADDER BIOPSY (N/A Bladder)  Patient Location: PACU  Anesthesia Type:General  Level of Consciousness: awake, alert , oriented, drowsy and patient cooperative  Airway & Oxygen Therapy: Patient Spontanous Breathing and Patient connected to nasal cannula oxygen  Post-op Assessment: Report given to RN and Post -op Vital signs reviewed and stable  Post vital signs: Reviewed and stable  Last Vitals:  Vitals Value Taken Time  BP 121/77 11/07/18 0928  Temp    Pulse 65 11/07/18 0930  Resp 14 11/07/18 0930  SpO2 100 % 11/07/18 0930  Vitals shown include unvalidated device data.  Last Pain:  Vitals:   11/07/18 0811  TempSrc:   PainSc: 0-No pain      Patients Stated Pain Goal: 5 (57/32/20 2542)  Complications: No apparent anesthesia complications

## 2018-11-08 ENCOUNTER — Encounter (HOSPITAL_BASED_OUTPATIENT_CLINIC_OR_DEPARTMENT_OTHER): Payer: Self-pay | Admitting: Urology

## 2018-11-08 DIAGNOSIS — I781 Nevus, non-neoplastic: Secondary | ICD-10-CM | POA: Diagnosis not present

## 2018-11-08 DIAGNOSIS — R338 Other retention of urine: Secondary | ICD-10-CM | POA: Diagnosis not present

## 2018-11-08 DIAGNOSIS — N138 Other obstructive and reflux uropathy: Secondary | ICD-10-CM | POA: Diagnosis not present

## 2018-11-08 DIAGNOSIS — N401 Enlarged prostate with lower urinary tract symptoms: Secondary | ICD-10-CM | POA: Diagnosis not present

## 2018-11-08 DIAGNOSIS — N3944 Nocturnal enuresis: Secondary | ICD-10-CM | POA: Diagnosis not present

## 2018-11-08 DIAGNOSIS — Z8551 Personal history of malignant neoplasm of bladder: Secondary | ICD-10-CM | POA: Diagnosis not present

## 2018-11-08 MED ORDER — HYDROCODONE-ACETAMINOPHEN 5-325 MG PO TABS
ORAL_TABLET | ORAL | Status: AC
  Filled 2018-11-08: qty 1

## 2018-11-08 NOTE — Progress Notes (Signed)
Pt has voided this am three times about 100cc each time, dark pink noted upon last void and bladder scan of 48cc noted. Pt also had a large BM with last void. Pt very anxious and drinking water frequently. States he is fearful of going home and being incontinent, also states he has had a history of not being able to void after his catheter is taken out. Voided another 100 cc of pink urine noted. Pt complains of incontinence after void. Small amount of urine noted to underwear, bladder scan revealed 37cc residual.

## 2018-11-08 NOTE — Discharge Summary (Signed)
Physician Discharge Summary      Patient ID: Benjamin Cardenas MRN: [id number] DOB/AGE: Mar 20, 1930 85 y.o.  Admit date: 11/07/2018 Discharge date: 11/08/2018  Admission Diagnoses: BENIGN PROSTATIC HYPERPLASIA WITH URINARY RETENTION, HISTORY OF BLADDER CANCER  Discharge Diagnoses:  Active Problems:   BPH with urinary obstruction History of bladder cancer  Discharged Condition: good  Hospital Course: He was electively brought to the operating room 24 hours ago for a transurethral resection of his prostate due to persistent urinary retention and also a biopsy of his bladder due to his past history of bladder cancer and the finding of an erythematous area on the posterior wall of the bladder at the time of cystoscopic evaluation preoperatively. He underwent transurethral resection of his prostate which turned out to be fairly diminutive and also underwent multiple bladder biopsies.  I found his bladder seemed to have a markedly diminished capacity with what seems like decreased compliance.  This may be inflammatory in nature due to the fact that his bladder appeared very inflamed throughout at the time of surgery. He was observed overnight with a catheter running on slow continuous bladder irrigation and his urine cleared.  He had had an episode of right lower quadrant pain last night but that has now resolved.  It sounds as if this may have been a bladder spasm.  He also reported to me that he had some diarrhea but the nursing staff indicates that that was actually not the case.  He he does have some memory difficulties and therefore this raises the question of the accuracy of his history at times. His Foley catheter has been removed and an attempted voiding trial will be undertaken this morning.  If he is able to urinate he will be discharged home without a Foley catheter however if he is unable to void a new Foley catheter will need to be reinserted.  He will then follow-up with me as an outpatient  and has an appointment scheduled already.  Discharge Exam: Blood pressure 99/66, pulse (!) 51, temperature 97.8 F (36.6 C), resp. rate 16, height 5\' 7"  (1.702 m), weight 63.5 kg, SpO2 100 %. General: Awake, alert and in no apparent distress. Chest: Normal respiratory effort. Cardiovascular: Regular rate and rhythm. Abdomen: Soft, nontender, nondistended.  No suprapubic mass or tenderness.   Disposition:    Allergies as of 11/08/2018      Reactions   Amoxicillin Rash   Has patient had a PCN reaction causing immediate rash, facial/tongue/throat swelling, SOB or lightheadedness with hypotension:No Has patient had a PCN reaction causing severe rash involving mucus membranes or skin necrosis:unsure Has patient had a PCN reaction that required hospitalization:No Has patient had a PCN reaction occurring within the last 10 years:unsure If all of the above answers are "NO", then may proceed with Cephalosporin use.   Ciprofloxacin Swelling, Rash   Sulfasalazine Swelling   Sulfa Antibiotics Swelling   Doxycycline Hyclate Rash   Levofloxacin Rash      Medication List    STOP taking these medications   alfuzosin 10 MG 24 hr tablet Commonly known as: UROXATRAL   finasteride 5 MG tablet Commonly known as: PROSCAR     TAKE these medications   aspirin EC 81 MG tablet Take 81 mg by mouth daily.   cetirizine 10 MG tablet Commonly known as: ZYRTEC Take 10 mg by mouth daily.   cyanocobalamin 1000 MCG tablet Take 1 tablet (1,000 mcg total) by mouth daily.   naphazoline-pheniramine 0.025-0.3 % ophthalmic solution Commonly known  as: NAPHCON-A Place 1 drop into both eyes 3 (three) times daily.   Trulance 3 MG Tabs Generic drug: Plecanatide Take 3 mg by mouth every morning.      Follow-up Information    Kathie Rhodes, MD On 11/21/2018.   Specialty: Urology Why: For your appiontment at 1:45 Contact information: [place] [id number] [phone]            Signed: Claybon Jabs 11/08/2018, 7:12 AM

## 2018-11-08 NOTE — Progress Notes (Signed)
Spoke with Dr. Karsten Ro regarding pts condition and concerns. Dr. Karsten Ro states pt may be discharged without a catheter and to call office if pts leakage continues after a few weeks to discuss medication. Pt informed, discharged to wife instructions reviewed.

## 2018-11-08 NOTE — Telephone Encounter (Signed)
Forms refaxed

## 2018-11-08 NOTE — Progress Notes (Signed)
Dr. Karsten Ro paged

## 2018-11-09 NOTE — Telephone Encounter (Signed)
Fax confirmed

## 2018-11-21 DIAGNOSIS — R8279 Other abnormal findings on microbiological examination of urine: Secondary | ICD-10-CM | POA: Diagnosis not present

## 2018-11-21 DIAGNOSIS — R300 Dysuria: Secondary | ICD-10-CM | POA: Diagnosis not present

## 2018-11-21 DIAGNOSIS — R3914 Feeling of incomplete bladder emptying: Secondary | ICD-10-CM | POA: Diagnosis not present

## 2018-12-07 DIAGNOSIS — L570 Actinic keratosis: Secondary | ICD-10-CM | POA: Diagnosis not present

## 2018-12-07 DIAGNOSIS — L309 Dermatitis, unspecified: Secondary | ICD-10-CM | POA: Diagnosis not present

## 2018-12-07 DIAGNOSIS — L821 Other seborrheic keratosis: Secondary | ICD-10-CM | POA: Diagnosis not present

## 2018-12-23 ENCOUNTER — Ambulatory Visit: Payer: Medicare Other | Admitting: Podiatry

## 2018-12-27 ENCOUNTER — Ambulatory Visit (INDEPENDENT_AMBULATORY_CARE_PROVIDER_SITE_OTHER): Payer: Medicare Other | Admitting: Podiatry

## 2018-12-27 ENCOUNTER — Other Ambulatory Visit: Payer: Self-pay

## 2018-12-27 ENCOUNTER — Encounter: Payer: Self-pay | Admitting: Podiatry

## 2018-12-27 DIAGNOSIS — Q828 Other specified congenital malformations of skin: Secondary | ICD-10-CM

## 2018-12-27 DIAGNOSIS — M79675 Pain in left toe(s): Secondary | ICD-10-CM | POA: Diagnosis not present

## 2018-12-27 DIAGNOSIS — M79674 Pain in right toe(s): Secondary | ICD-10-CM | POA: Diagnosis not present

## 2018-12-27 DIAGNOSIS — G629 Polyneuropathy, unspecified: Secondary | ICD-10-CM

## 2018-12-27 DIAGNOSIS — B351 Tinea unguium: Secondary | ICD-10-CM | POA: Diagnosis not present

## 2018-12-27 DIAGNOSIS — L84 Corns and callosities: Secondary | ICD-10-CM

## 2018-12-27 NOTE — Patient Instructions (Signed)

## 2019-01-01 NOTE — Progress Notes (Signed)
Subjective: Benjamin Cardenas is seen today for follow up painful, elongated, thickened toenails 1-5 b/l feet that he cannot cut. Pain interferes with daily activities. Aggravating factor includes wearing enclosed shoe gear and relieved with periodic debridement.  Current Outpatient Medications on File Prior to Visit  Medication Sig  . aspirin EC 81 MG tablet Take 81 mg by mouth daily.   . cetirizine (ZYRTEC) 10 MG tablet Take 10 mg by mouth daily.   . naphazoline-pheniramine (NAPHCON-A) 0.025-0.3 % ophthalmic solution Place 1 drop into both eyes 3 (three) times daily.   [name] triamcinolone cream (KENALOG) 0.1 % APPLY ONE APPLICATION TO THE AFFECTED AREA 2 TIMES DAILY AS NEEDED FOR 14 DAYS  . TRULANCE 3 MG TABS Take 3 mg by mouth every morning.   . vitamin B-12 1000 MCG tablet Take 1 tablet (1,000 mcg total) by mouth daily.   No current facility-administered medications on file prior to visit.      Allergies  Allergen Reactions  . Amoxicillin Rash    Has patient had a PCN reaction causing immediate rash, facial/tongue/throat swelling, SOB or lightheadedness with hypotension:No Has patient had a PCN reaction causing severe rash involving mucus membranes or skin necrosis:unsure Has patient had a PCN reaction that required hospitalization:No Has patient had a PCN reaction occurring within the last 10 years:unsure If all of the above answers are "NO", then may proceed with Cephalosporin use.   . Ciprofloxacin Swelling and Rash  . Sulfasalazine Swelling  . Sulfa Antibiotics Swelling  . Doxycycline Hyclate Rash  . Levofloxacin Rash   Objective:  Vascular Examination: Capillary refill time <3 seconds x 10 digits.  Dorsalis pedis and Posterior tibial pulses present b/l.  Digital hair absent b/l.  Skin temperature gradient WNL b/l.   Dermatological Examination: Skin with normal turgor, texture and tone b/l.  Toenails 1-5 b/l discolored, thick, dystrophic with subungual debris and pain  with palpation to nailbeds due to thickness of nails.  Hyperkeratotic lesion b/l hallux. No edema, no erythema, no drainage, no flocculence.  Porokeratotic lesions submet heads 1, 5 b/l with tenderness to palpation. No erythema, no edema, no drainage, no flocculence.  Musculoskeletal: Muscle strength 5/5 to all LE muscle groups b/l.  No gross bony deformities b/l.  No pain, crepitus or joint limitation noted with ROM.   Neurological Examination: Protective sensation intact with 10 gram monofilament bilaterally.  Epicritic sensation present bilaterally.  Vibratory sensation decreased b/l.  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 Toenails 1-5 b/l were debrided in length and girth without iatrogenic bleeding.  2. Porokeratoses submet heads 1, 5 b/l pared and enucleated with sterile scalpel blade without incident. Calluses pared b/l hallux utilizing sterile scalpel blade without incident. 3. Patient to continue soft, supportive shoe gear. 4. Patient to report any pedal injuries to medical professional immediately. 5. Follow up 9 weeks. 6. Patient/POA to call should there be a concern in the interim.

## 2019-01-10 ENCOUNTER — Encounter: Payer: Medicare Other | Admitting: Internal Medicine

## 2019-01-11 DIAGNOSIS — L820 Inflamed seborrheic keratosis: Secondary | ICD-10-CM | POA: Diagnosis not present

## 2019-01-11 DIAGNOSIS — Z23 Encounter for immunization: Secondary | ICD-10-CM | POA: Diagnosis not present

## 2019-01-17 ENCOUNTER — Ambulatory Visit (INDEPENDENT_AMBULATORY_CARE_PROVIDER_SITE_OTHER): Payer: Medicare Other

## 2019-01-17 ENCOUNTER — Other Ambulatory Visit: Payer: Self-pay

## 2019-01-17 DIAGNOSIS — Z23 Encounter for immunization: Secondary | ICD-10-CM

## 2019-01-17 DIAGNOSIS — M5416 Radiculopathy, lumbar region: Secondary | ICD-10-CM | POA: Diagnosis not present

## 2019-01-18 DIAGNOSIS — R8279 Other abnormal findings on microbiological examination of urine: Secondary | ICD-10-CM | POA: Diagnosis not present

## 2019-01-19 DIAGNOSIS — R8279 Other abnormal findings on microbiological examination of urine: Secondary | ICD-10-CM | POA: Diagnosis not present

## 2019-01-19 DIAGNOSIS — N3941 Urge incontinence: Secondary | ICD-10-CM | POA: Diagnosis not present

## 2019-01-27 ENCOUNTER — Encounter: Payer: Self-pay | Admitting: Internal Medicine

## 2019-01-27 ENCOUNTER — Other Ambulatory Visit: Payer: Self-pay | Admitting: Internal Medicine

## 2019-01-27 ENCOUNTER — Other Ambulatory Visit: Payer: Self-pay

## 2019-01-27 ENCOUNTER — Ambulatory Visit (INDEPENDENT_AMBULATORY_CARE_PROVIDER_SITE_OTHER): Payer: Medicare Other | Admitting: Internal Medicine

## 2019-01-27 VITALS — BP 108/68 | HR 94 | Temp 97.6°F | Ht 65.5 in | Wt 139.8 lb

## 2019-01-27 DIAGNOSIS — E559 Vitamin D deficiency, unspecified: Secondary | ICD-10-CM

## 2019-01-27 DIAGNOSIS — Z0000 Encounter for general adult medical examination without abnormal findings: Secondary | ICD-10-CM | POA: Diagnosis not present

## 2019-01-27 DIAGNOSIS — R351 Nocturia: Secondary | ICD-10-CM | POA: Diagnosis not present

## 2019-01-27 DIAGNOSIS — R0789 Other chest pain: Secondary | ICD-10-CM | POA: Diagnosis not present

## 2019-01-27 DIAGNOSIS — C679 Malignant neoplasm of bladder, unspecified: Secondary | ICD-10-CM | POA: Diagnosis not present

## 2019-01-27 DIAGNOSIS — N401 Enlarged prostate with lower urinary tract symptoms: Secondary | ICD-10-CM

## 2019-01-27 DIAGNOSIS — D511 Vitamin B12 deficiency anemia due to selective vitamin B12 malabsorption with proteinuria: Secondary | ICD-10-CM | POA: Diagnosis not present

## 2019-01-27 DIAGNOSIS — M48062 Spinal stenosis, lumbar region with neurogenic claudication: Secondary | ICD-10-CM

## 2019-01-27 LAB — LIPID PANEL
Cholesterol: 174 mg/dL (ref 0–200)
HDL: 62.3 mg/dL (ref >39.00)
LDL Cholesterol: 99 mg/dL (ref 0–99)
NonHDL: 111.23
Total CHOL/HDL Ratio: 3
Triglycerides: 60 mg/dL (ref 0.0–149.0)
VLDL: 12 mg/dL (ref 0.0–40.0)

## 2019-01-27 LAB — CBC WITH DIFFERENTIAL/PLATELET
Basophils Absolute: 0.1 10*3/uL (ref 0.0–0.1)
Basophils Relative: 0.7 % (ref 0.0–3.0)
Eosinophils Absolute: 0.2 10*3/uL (ref 0.0–0.7)
Eosinophils Relative: 2.1 % (ref 0.0–5.0)
HCT: 36.5 % — ABNORMAL LOW (ref 39.0–52.0)
Hemoglobin: 11.9 g/dL — ABNORMAL LOW (ref 13.0–17.0)
Lymphocytes Relative: 19 % (ref 12.0–46.0)
Lymphs Abs: 1.4 10*3/uL (ref 0.7–4.0)
MCHC: 32.5 g/dL (ref 30.0–36.0)
MCV: 92.4 fl (ref 78.0–100.0)
Monocytes Absolute: 0.4 10*3/uL (ref 0.1–1.0)
Monocytes Relative: 4.7 % (ref 3.0–12.0)
Neutro Abs: 5.4 10*3/uL (ref 1.4–7.7)
Neutrophils Relative %: 73.5 % (ref 43.0–77.0)
Platelets: 247 10*3/uL (ref 150.0–400.0)
RBC: 3.95 Mil/uL — ABNORMAL LOW (ref 4.22–5.81)
RDW: 15 % (ref 11.5–15.5)
WBC: 7.4 10*3/uL (ref 4.0–10.5)

## 2019-01-27 LAB — COMPREHENSIVE METABOLIC PANEL
ALT: 10 U/L (ref 0–53)
AST: 16 U/L (ref 0–37)
Albumin: 4.2 g/dL (ref 3.5–5.2)
Alkaline Phosphatase: 92 U/L (ref 39–117)
BUN: 34 mg/dL — ABNORMAL HIGH (ref 6–23)
CO2: 28 mEq/L (ref 19–32)
Calcium: 9.4 mg/dL (ref 8.4–10.5)
Chloride: 107 mEq/L (ref 96–112)
Creatinine, Ser: 1.6 mg/dL — ABNORMAL HIGH (ref 0.40–1.50)
GFR: 41.05 mL/min — ABNORMAL LOW (ref >60.00)
Glucose, Bld: 97 mg/dL (ref 70–99)
Potassium: 4.6 mEq/L (ref 3.5–5.1)
Sodium: 143 mEq/L (ref 135–145)
Total Bilirubin: 0.7 mg/dL (ref 0.2–1.2)
Total Protein: 6.8 g/dL (ref 6.0–8.3)

## 2019-01-27 LAB — VITAMIN D 25 HYDROXY (VIT D DEFICIENCY, FRACTURES): VITD: 15.98 ng/mL — ABNORMAL LOW (ref 30.00–100.00)

## 2019-01-27 LAB — VITAMIN B12: Vitamin B-12: 245 pg/mL (ref 211–911)

## 2019-01-27 MED ORDER — VITAMIN D (ERGOCALCIFEROL) 1.25 MG (50000 UNIT) PO CAPS: 50000.0000 [IU] | ORAL_CAPSULE | ORAL | 0 refills | Status: AC

## 2019-01-27 NOTE — Progress Notes (Signed)
Established Patient Office Visit     CC/Reason for Visit: Annual preventive exam and subsequent Medicare wellness visit  HPI: Benjamin Cardenas is a 85 y.o. male who is coming in today for the above mentioned reasons. Past Medical History is significant for: History of bladder cancer, BPH, spinal stenosis.  Since I last saw him he has had some significant issues.  Most significantly he has had his stroke and bladder biopsies that were negative for cancer.  They are very excited that his catheter has been removed, he has not had an issue with UTIs and sepsis since.  Unfortunately, he continues to have significant incontinence, he was recently started on Myrbetriq, has been taking it for about 8 days but does not note any significant difference.  His urologist is Dr. Karsten Ro.  He also has spinal stenosis, recently had an epidural injection that was not helpful, he is doing some physical therapy.  Other than this he is remarkably healthy.  He has routine eye and dental care, does not do much in the way of exercise other than with physical therapy.   Past Medical/Surgical History: Past Medical History:  Diagnosis Date  . BPH with urinary obstruction   . Chronic constipation   . Chronic low back pain   . Cognitive impairment   . DOE (dyspnea on exertion)   . Dry skin dermatitis   . Elevated PSA   . Foley catheter in place    since 08-13-2018  . History of bladder cancer    urologist--- dr Consuella Lose  . History of colon polyps   . History of sepsis    previous admission's for sepsis due to UTI's and CAP:   last admission's 05/ 2020 and 06/ 2020 sepsis secondary to UTI  . History of supraventricular tachycardia 11-03-2018  pt denies any symptoms since 2018   03/ 2018 during admission for CAP with sepsis-- episode of SVT;  pt cardiac evaluation after discharge 08-17-2016 (note in epic, b. bhagat PA),  normal echo,  event montior no sig. arrrhythmias heart rate range 36-120  . IDA (iron  deficiency anemia)   . Lower urinary tract symptoms (LUTS)   . Lumbar stenosis   . OA (osteoarthritis)   . Prostate nodule   . Seasonal allergic rhinitis   . Urinary retention   . Wears glasses     Past Surgical History:  Procedure Laterality Date  . CATARACT EXTRACTION W/ INTRAOCULAR LENS  IMPLANT, BILATERAL  09/2017  . cataract surgery    . CYSTOSCOPY WITH BIOPSY N/A 04/22/2015   Procedure: CYSTOSCOPY WITH BLADDER BIOPSY;  Surgeon: Kathie Rhodes, MD;  Location: [facility];  Service: Urology;  Laterality: N/A;  . CYSTOSCOPY WITH RETROGRADE PYELOGRAM, URETEROSCOPY AND STENT PLACEMENT Left 02/25/2015   Procedure: LEFT RETROGRADE PYELOGRAM, URETEROSCOPY ;  Surgeon: Kathie Rhodes, MD;  Location: [facility];  Service: Urology;  Laterality: Left;  . HERNIA REPAIR  age 37 1937   rupture right inguinal hernia  . INGUINAL HERNIA REPAIR  03/02/2011   Procedure: HERNIA REPAIR INGUINAL ADULT;  Surgeon: Earnstine Regal, MD;  Location: [facility];  Service: General;  Laterality: Left;  with mesh   . TRANSURETHRAL RESECTION OF BLADDER TUMOR WITH GYRUS (TURBT-GYRUS) N/A 02/25/2015   Procedure: TRANSURETHRAL RESECTION OF BLADDER TUMOR WITH GYRUS (TURBT-GYRUS);  Surgeon: Kathie Rhodes, MD;  Location: [facility];  Service: Urology;  Laterality: N/A;  . TRANSURETHRAL RESECTION OF PROSTATE N/A 11/07/2018   Procedure: TRANSURETHRAL RESECTION  OF THE PROSTATE (TURP)/ BLADDER BIOPSY;  Surgeon: Kathie Rhodes, MD;  Location: [facility];  Service: Urology;  Laterality: N/A;    Social History:  reports that he quit smoking about 39 years ago. His smoking use included cigarettes. He has a 51.00 pack-year smoking history. He has never used smokeless tobacco. He reports current alcohol use. He reports that he does not use drugs.  Allergies: Allergies  Allergen Reactions  . Amoxicillin Rash    Has patient had a PCN reaction causing immediate rash,  facial/tongue/throat swelling, SOB or lightheadedness with hypotension:No Has patient had a PCN reaction causing severe rash involving mucus membranes or skin necrosis:unsure Has patient had a PCN reaction that required hospitalization:No Has patient had a PCN reaction occurring within the last 10 years:unsure If all of the above answers are "NO", then may proceed with Cephalosporin use.   . Ciprofloxacin Swelling and Rash  . Sulfasalazine Swelling  . Sulfa Antibiotics Swelling  . Doxycycline Hyclate Rash  . Levofloxacin Rash    Family History:  Family History  Problem Relation Age of Onset  . Heart disease Mother   . Heart disease Father   . Cancer Sister        breast  . Cancer Brother        colon     Current Outpatient Medications:  .  aspirin EC 81 MG tablet, Take 81 mg by mouth daily. , Disp: , Rfl:  .  Calcium Glycerophosphate (PRELIEF PO), Take by mouth. OTC - Bladder irritation, Disp: , Rfl:  .  cetirizine (ZYRTEC) 10 MG tablet, Take 10 mg by mouth daily. , Disp: , Rfl:  .  mirabegron ER (MYRBETRIQ) 25 MG TB24 tablet, Take 25 mg by mouth., Disp: , Rfl:  .  naphazoline-pheniramine (NAPHCON-A) 0.025-0.3 % ophthalmic solution, Place 1 drop into both eyes 3 (three) times daily. , Disp: , Rfl:  .  OVER THE COUNTER MEDICATION, D-Mannose powder - urinary tract health, Disp: , Rfl:  .  triamcinolone cream (KENALOG) 0.1 %, APPLY ONE APPLICATION TO THE AFFECTED AREA 2 TIMES DAILY AS NEEDED FOR 14 DAYS, Disp: , Rfl:  .  TRULANCE 3 MG TABS, Take 3 mg by mouth every morning. , Disp: , Rfl:  .  vitamin B-12 1000 MCG tablet, Take 1 tablet (1,000 mcg total) by mouth daily., Disp: 30 tablet, Rfl: 0  Review of Systems:  Constitutional: Denies fever, chills, diaphoresis, appetite change and fatigue.  HEENT: Denies photophobia, eye pain, redness, hearing loss, ear pain, congestion, sore throat, rhinorrhea, sneezing, mouth sores, trouble swallowing, neck pain, neck stiffness and tinnitus.    Respiratory: Denies SOB, DOE, cough, chest tightness,  and wheezing.   Cardiovascular: Denies chest pain, palpitations and leg swelling.  Gastrointestinal: Denies nausea, vomiting, abdominal pain, diarrhea, constipation, blood in stool and abdominal distention.  Genitourinary: Denies dysuria, urgency, frequency, hematuria, flank pain and difficulty urinating.  Endocrine: Denies: hot or cold intolerance, sweats, changes in hair or nails, polyuria, polydipsia. Musculoskeletal: Denies, joint swelling.  Skin: Denies pallor, rash and wound.  Neurological: Denies dizziness, seizures, syncope, light-headedness and headaches.  Hematological: Denies adenopathy. Easy bruising, personal or family bleeding history  Psychiatric/Behavioral: Denies suicidal ideation, mood changes, confusion, nervousness, sleep disturbance and agitation    Physical Exam: Vitals:   01/27/19 1009  BP: 108/68  Pulse: 94  Temp: 97.6 F (36.4 C)  TempSrc: Temporal  SpO2: 98%  Weight: 139 lb 12.8 oz (63.4 kg)  Height: 5' 5.5" (1.664 m)  Body mass index is 22.91 kg/m.   Constitutional: NAD, calm, comfortable Eyes: PERRL, lids and conjunctivae normal ENMT: Mucous membranes are moist.  Tympanic membrane is pearly white, no erythema or bulging. Neck: normal, supple, no masses, no thyromegaly Respiratory: clear to auscultation bilaterally, no wheezing, no crackles. Normal respiratory effort. No accessory muscle use.  Cardiovascular: Regular rate and rhythm, no murmurs / rubs / gallops. No extremity edema. 2+ pedal pulses.   Abdomen: no tenderness, no masses palpated. No hepatosplenomegaly. Bowel sounds positive.  Musculoskeletal: no clubbing / cyanosis. No joint deformity upper and lower extremities. Good ROM, no contractures. Normal muscle tone.  Skin: no rashes, lesions, ulcers. No induration Neurologic: CN 2-12 grossly intact. Sensation intact, DTR normal. Strength 5/5 in all 4.  Psychiatric: Normal judgment and  insight. Alert and oriented x 3. Normal mood.   Subsequent Medicare wellness visit   1. Risk factors, based on past  M,S,F -cardiovascular disease risk factors include age, gender   2.  Physical activities: Limited by his spinal stenosis, works with physical therapy   3.  Depression/mood:  Stable, not depressed   4.  Hearing:  Has been having some hearing issues   5.  ADL's: Independent with most ADLs, he still dresses and bathes himself, he makes dinner every night   6.  Fall risk:  Low fall risk   7.  Home safety: No problems identified   8.  Height weight, and visual acuity: Height and weight as above, visual acuity is 20/20 with his right eye, 20/25 with his left eye and 20/20 with both eyes   9.  Counseling:  Advised to follow-up with neurology as indicated for continued management of his urinary incontinence   10. Lab orders based on risk factors: Laboratory update will be reviewed   11. Referral :  None today   12. Care plan:  Follow-up with me in 6 months   13. Cognitive assessment:  No cognitive impairment   14. Screening: Patient provided with a written and personalized 5-10 year screening schedule in the AVS.   yes   15. Provider List Update:   PCP, urology (Dr. Karsten Ro), neurosurgery (Dr. Kathyrn Sheriff)  62. Advance Directives: Full code     Fall Risk  01/27/2019 03/10/2018 02/21/2018 01/06/2018 03/11/2017  Falls in the past year? 0 0 0 No No  Comment - - Emmi Telephone Survey: data to providers prior to load - Emmi Telephone Survey: data to providers prior to load  Number falls in past yr: 0 0 - - -  Injury with Fall? 0 0 - - -      Impression and Plan:  Encounter for preventive health examination  -Has routine eye and dental care. -Immunizations are up-to-date. -Screening labs today. -Healthy lifestyle has been discussed in detail. -He had a colonoscopy in 2016, has elected to defer further due to age.  Benign prostatic hyperplasia with nocturia  -Status post recent TURP, follows with urology  Malignant neoplasm of urinary bladder, unspecified site [facility])  -Status post recent negative bladder biopsy, follows with urology.  Spinal stenosis of lumbar region with neurogenic claudication -Still with some pain issues. -Follows with neurosurgery.  Tightness in chest  -He describes some chest discomfort towards the end of the day especially if he has been more physically active or has done physical therapy that day. -EKG done in office today and interpreted by myself as: Sinus bradycardia at a rate of 56, right bundle branch block, right axis deviation, no acute ST-T  wave changes. -Given his mild symptoms and normal EKG, I feel no need for investigate this at this moment.  He has been encouraged to contact us with recurrence of symptoms or worsening symptoms.    Patient Instructions  -Nice seeing you today!!  -Lab work today; will notify you once results are available.  -Schedule follow up in 6 months.   Preventive Care 75 Years and Older, Male Preventive care refers to lifestyle choices and visits with your health care provider that can promote health and wellness. This includes:  A yearly physical exam. This is also called an annual well check.  Regular dental and eye exams.  Immunizations.  Screening for certain conditions.  Healthy lifestyle choices, such as diet and exercise. What can I expect for my preventive care visit? Physical exam Your health care provider will check:  Height and weight. These may be used to calculate body mass index (BMI), which is a measurement that tells if you are at a healthy weight.  Heart rate and blood pressure.  Your skin for abnormal spots. Counseling Your health care provider may ask you questions about:  Alcohol, tobacco, and drug use.  Emotional well-being.  Home and relationship well-being.  Sexual activity.  Eating habits.  History of falls.  Memory and ability to  understand (cognition).  Work and work Statistician. What immunizations do I need?  Influenza (flu) vaccine  This is recommended every year. Tetanus, diphtheria, and pertussis (Tdap) vaccine  You may need a Td booster every 10 years. Varicella (chickenpox) vaccine  You may need this vaccine if you have not already been vaccinated. Zoster (shingles) vaccine  You may need this after age 51. Pneumococcal conjugate (PCV13) vaccine  One dose is recommended after age 62. Pneumococcal polysaccharide (PPSV23) vaccine  One dose is recommended after age 41. Measles, mumps, and rubella (MMR) vaccine  You may need at least one dose of MMR if you were born in 1957 or later. You may also need a second dose. Meningococcal conjugate (MenACWY) vaccine  You may need this if you have certain conditions. Hepatitis A vaccine  You may need this if you have certain conditions or if you travel or work in places where you may be exposed to hepatitis A. Hepatitis B vaccine  You may need this if you have certain conditions or if you travel or work in places where you may be exposed to hepatitis B. Haemophilus influenzae type b (Hib) vaccine  You may need this if you have certain conditions. You may receive vaccines as individual doses or as more than one vaccine together in one shot (combination vaccines). Talk with your health care provider about the risks and benefits of combination vaccines. What tests do I need? Blood tests  Lipid and cholesterol levels. These may be checked every 5 years, or more frequently depending on your overall health.  Hepatitis C test.  Hepatitis B test. Screening  Lung cancer screening. You may have this screening every year starting at age 84 if you have a 30-pack-year history of smoking and currently smoke or have quit within the past 15 years.  Colorectal cancer screening. All adults should have this screening starting at age 1 and continuing until age 45.  Your health care provider may recommend screening at age 32 if you are at increased risk. You will have tests every 1-10 years, depending on your results and the type of screening test.  Prostate cancer screening. Recommendations will vary depending on your family history and  other risks.  Diabetes screening. This is done by checking your blood sugar (glucose) after you have not eaten for a while (fasting). You may have this done every 1-3 years.  Abdominal aortic aneurysm (AAA) screening. You may need this if you are a current or former smoker.  Sexually transmitted disease (STD) testing. Follow these instructions at home: Eating and drinking  Eat a diet that includes fresh fruits and vegetables, whole grains, lean protein, and low-fat dairy products. Limit your intake of foods with high amounts of sugar, saturated fats, and salt.  Take vitamin and mineral supplements as recommended by your health care provider.  Do not drink alcohol if your health care provider tells you not to drink.  If you drink alcohol: ? Limit how much you have to 0-2 drinks a day. ? Be aware of how much alcohol is in your drink. In the [place] one drink equals one 12 oz bottle of beer (355 mL), one 5 oz glass of wine (148 mL), or one 1 oz glass of hard liquor (44 mL). Lifestyle  Take daily care of your teeth and gums.  Stay active. Exercise for at least 30 minutes on 5 or more days each week.  Do not use any products that contain nicotine or tobacco, such as cigarettes, e-cigarettes, and chewing tobacco. If you need help quitting, ask your health care provider.  If you are sexually active, practice safe sex. Use a condom or other form of protection to prevent STIs (sexually transmitted infections).  Talk with your health care provider about taking a low-dose aspirin or statin. What's next?  Visit your health care provider once a year for a well check visit.  Ask your health care provider how often you  should have your eyes and teeth checked.  Stay up to date on all vaccines. This information is not intended to replace advice given to you by your health care provider. Make sure you discuss any questions you have with your health care provider. Document Released: 04/19/2015 Document Revised: 03/17/2018 Document Reviewed: 03/17/2018 Elsevier Patient Education  2020 [place], MD [facility]

## 2019-01-27 NOTE — Patient Instructions (Signed)
-Nice seeing you today!!  -Lab work today; will notify you once results are available.  -Schedule follow up in 6 months.   Preventive Care 65 Years and Older, Male Preventive care refers to lifestyle choices and visits with your health care provider that can promote health and wellness. This includes:  A yearly physical exam. This is also called an annual well check.  Regular dental and eye exams.  Immunizations.  Screening for certain conditions.  Healthy lifestyle choices, such as diet and exercise. What can I expect for my preventive care visit? Physical exam Your health care provider will check:  Height and weight. These may be used to calculate body mass index (BMI), which is a measurement that tells if you are at a healthy weight.  Heart rate and blood pressure.  Your skin for abnormal spots. Counseling Your health care provider may ask you questions about:  Alcohol, tobacco, and drug use.  Emotional well-being.  Home and relationship well-being.  Sexual activity.  Eating habits.  History of falls.  Memory and ability to understand (cognition).  Work and work environment. What immunizations do I need?  Influenza (flu) vaccine  This is recommended every year. Tetanus, diphtheria, and pertussis (Tdap) vaccine  You may need a Td booster every 10 years. Varicella (chickenpox) vaccine  You may need this vaccine if you have not already been vaccinated. Zoster (shingles) vaccine  You may need this after age 60. Pneumococcal conjugate (PCV13) vaccine  One dose is recommended after age 83. Pneumococcal polysaccharide (PPSV23) vaccine  One dose is recommended after age 83. Measles, mumps, and rubella (MMR) vaccine  You may need at least one dose of MMR if you were born in 1957 or later. You may also need a second dose. Meningococcal conjugate (MenACWY) vaccine  You may need this if you have certain conditions. Hepatitis A vaccine  You may need  this if you have certain conditions or if you travel or work in places where you may be exposed to hepatitis A. Hepatitis B vaccine  You may need this if you have certain conditions or if you travel or work in places where you may be exposed to hepatitis B. Haemophilus influenzae type b (Hib) vaccine  You may need this if you have certain conditions. You may receive vaccines as individual doses or as more than one vaccine together in one shot (combination vaccines). Talk with your health care provider about the risks and benefits of combination vaccines. What tests do I need? Blood tests  Lipid and cholesterol levels. These may be checked every 5 years, or more frequently depending on your overall health.  Hepatitis C test.  Hepatitis B test. Screening  Lung cancer screening. You may have this screening every year starting at age 55 if you have a 30-pack-year history of smoking and currently smoke or have quit within the past 15 years.  Colorectal cancer screening. All adults should have this screening starting at age 50 and continuing until age 75. Your health care provider may recommend screening at age 45 if you are at increased risk. You will have tests every 1-10 years, depending on your results and the type of screening test.  Prostate cancer screening. Recommendations will vary depending on your family history and other risks.  Diabetes screening. This is done by checking your blood sugar (glucose) after you have not eaten for a while (fasting). You may have this done every 1-3 years.  Abdominal aortic aneurysm (AAA) screening. You may need this   if you are a current or former smoker.  Sexually transmitted disease (STD) testing. Follow these instructions at home: Eating and drinking  Eat a diet that includes fresh fruits and vegetables, whole grains, lean protein, and low-fat dairy products. Limit your intake of foods with high amounts of sugar, saturated fats, and salt.  Take  vitamin and mineral supplements as recommended by your health care provider.  Do not drink alcohol if your health care provider tells you not to drink.  If you drink alcohol: ? Limit how much you have to 0-2 drinks a day. ? Be aware of how much alcohol is in your drink. In the [place] one drink equals one 12 oz bottle of beer (355 mL), one 5 oz glass of wine (148 mL), or one 1 oz glass of hard liquor (44 mL). Lifestyle  Take daily care of your teeth and gums.  Stay active. Exercise for at least 30 minutes on 5 or more days each week.  Do not use any products that contain nicotine or tobacco, such as cigarettes, e-cigarettes, and chewing tobacco. If you need help quitting, ask your health care provider.  If you are sexually active, practice safe sex. Use a condom or other form of protection to prevent STIs (sexually transmitted infections).  Talk with your health care provider about taking a low-dose aspirin or statin. What's next?  Visit your health care provider once a year for a well check visit.  Ask your health care provider how often you should have your eyes and teeth checked.  Stay up to date on all vaccines. This information is not intended to replace advice given to you by your health care provider. Make sure you discuss any questions you have with your health care provider. Document Released: 04/19/2015 Document Revised: 03/17/2018 Document Reviewed: 03/17/2018 Elsevier Patient Education  2020 Elsevier Inc.   

## 2019-01-30 ENCOUNTER — Other Ambulatory Visit: Payer: Self-pay | Admitting: Internal Medicine

## 2019-01-30 DIAGNOSIS — R899 Unspecified abnormal finding in specimens from other organs, systems and tissues: Secondary | ICD-10-CM

## 2019-01-30 DIAGNOSIS — E559 Vitamin D deficiency, unspecified: Secondary | ICD-10-CM

## 2019-02-06 DIAGNOSIS — M5416 Radiculopathy, lumbar region: Secondary | ICD-10-CM | POA: Diagnosis not present

## 2019-02-06 DIAGNOSIS — I10 Essential (primary) hypertension: Secondary | ICD-10-CM | POA: Diagnosis not present

## 2019-03-13 DIAGNOSIS — L570 Actinic keratosis: Secondary | ICD-10-CM | POA: Diagnosis not present

## 2019-03-15 ENCOUNTER — Ambulatory Visit (INDEPENDENT_AMBULATORY_CARE_PROVIDER_SITE_OTHER): Payer: Medicare Other | Admitting: Podiatry

## 2019-03-15 ENCOUNTER — Other Ambulatory Visit: Payer: Self-pay

## 2019-03-15 ENCOUNTER — Encounter: Payer: Self-pay | Admitting: Podiatry

## 2019-03-15 DIAGNOSIS — Q828 Other specified congenital malformations of skin: Secondary | ICD-10-CM | POA: Diagnosis not present

## 2019-03-15 DIAGNOSIS — G629 Polyneuropathy, unspecified: Secondary | ICD-10-CM | POA: Diagnosis not present

## 2019-03-15 DIAGNOSIS — B351 Tinea unguium: Secondary | ICD-10-CM | POA: Diagnosis not present

## 2019-03-15 DIAGNOSIS — M79675 Pain in left toe(s): Secondary | ICD-10-CM

## 2019-03-15 DIAGNOSIS — L84 Corns and callosities: Secondary | ICD-10-CM | POA: Diagnosis not present

## 2019-03-15 DIAGNOSIS — M79674 Pain in right toe(s): Secondary | ICD-10-CM

## 2019-03-23 NOTE — Progress Notes (Signed)
Subjective: Benjamin Cardenas presents to clinic with h/o neuropathy for chronic  with cc of painful mycotic toenails and painful calluses of both feet which are aggravated when weightbearing with and without shoe gear.  This pain limits his daily activities. Pain symptoms resolve with periodic professional debridement.  He voices no new pedal concerns on today's visit.   Medications reviewed in chart.  Allergies  Allergen Reactions  . Amoxicillin Rash    Has patient had a PCN reaction causing immediate rash, facial/tongue/throat swelling, SOB or lightheadedness with hypotension:No Has patient had a PCN reaction causing severe rash involving mucus membranes or skin necrosis:unsure Has patient had a PCN reaction that required hospitalization:No Has patient had a PCN reaction occurring within the last 10 years:unsure If all of the above answers are "NO", then may proceed with Cephalosporin use.   . Ciprofloxacin Swelling and Rash  . Sulfasalazine Swelling  . Sulfa Antibiotics Swelling  . Doxycycline Hyclate Rash  . Levofloxacin Rash    Objective: Physical Examination:  Vascular  Examination: Capillary refill time to digits immediate b/l.  Palpable DP/PT pulses b/l.  Digital hair absent b/l.  No edema noted b/l.  Skin temperature gradient WNL b/l.  Dermatological Examination: Skin with normal turgor, texture and tone b/l.  No open wounds b/l.  No interdigital macerations noted b/l.  Elongated, thick, discolored brittle toenails with subungual debris and pain on dorsal palpation of nailbeds 1-5 b/l.  Hyperkeratotic lesion b/l hallux with tenderness to palpation. No edema, no erythema, no drainage, no flocculence. Porokeratotic lesions submet heads 1, 5 b/l with tenderness to palpation. No erythema, no edema, no drainage, no flocculence.   Musculoskeletal Examination: Muscle strength 5/5 to all muscle groups b/l.  No pain, crepitus or joint discomfort with active/passive  ROM.  Neurological Examination: Sensation intact 5/5 b/l with 10 gram monofilament.  Vibratory sensation decreased b/l.   Assessment: 1. Mycotic nail infection with pain 1-5 b/l 2. Calluses b/l hallux 3. Porokeratosis  submet heads 1, 5 b/l 4. Neuropathy  Plan: 1. Toenails 1-5 b/l were debrided in length and girth without iatrogenic laceration. 2. Painful porokeratotoc lesion pared submet heads 1, 5 b/l. 3. Painful hyperkeratotic lesion pared b/l hallux. 4. Continue soft, supportive shoe gear daily. 5. Report any pedal injuries to medical professional. 6. Follow up 3 months. 7. Patient/POA to call should there be a question/concern in there interim.

## 2019-05-01 ENCOUNTER — Encounter: Payer: Self-pay | Admitting: Internal Medicine

## 2019-05-03 ENCOUNTER — Other Ambulatory Visit: Payer: Self-pay

## 2019-05-03 ENCOUNTER — Other Ambulatory Visit (INDEPENDENT_AMBULATORY_CARE_PROVIDER_SITE_OTHER): Payer: Medicare Other

## 2019-05-03 DIAGNOSIS — R899 Unspecified abnormal finding in specimens from other organs, systems and tissues: Secondary | ICD-10-CM

## 2019-05-03 DIAGNOSIS — E559 Vitamin D deficiency, unspecified: Secondary | ICD-10-CM | POA: Diagnosis not present

## 2019-05-03 LAB — COMPREHENSIVE METABOLIC PANEL
ALT: 12 U/L (ref 0–53)
AST: 19 U/L (ref 0–37)
Albumin: 4.2 g/dL (ref 3.5–5.2)
Alkaline Phosphatase: 100 U/L (ref 39–117)
BUN: 40 mg/dL — ABNORMAL HIGH (ref 6–23)
CO2: 27 mEq/L (ref 19–32)
Calcium: 9.7 mg/dL (ref 8.4–10.5)
Chloride: 106 mEq/L (ref 96–112)
Creatinine, Ser: 1.66 mg/dL — ABNORMAL HIGH (ref 0.40–1.50)
GFR: 39.32 mL/min — ABNORMAL LOW (ref >60.00)
Glucose, Bld: 86 mg/dL (ref 70–99)
Potassium: 5.5 mEq/L — ABNORMAL HIGH (ref 3.5–5.1)
Sodium: 138 mEq/L (ref 135–145)
Total Bilirubin: 0.5 mg/dL (ref 0.2–1.2)
Total Protein: 6.9 g/dL (ref 6.0–8.3)

## 2019-05-03 LAB — VITAMIN D 25 HYDROXY (VIT D DEFICIENCY, FRACTURES): VITD: 36.8 ng/mL (ref 30.00–100.00)

## 2019-05-04 ENCOUNTER — Other Ambulatory Visit: Payer: Self-pay | Admitting: Internal Medicine

## 2019-05-04 DIAGNOSIS — R899 Unspecified abnormal finding in specimens from other organs, systems and tissues: Secondary | ICD-10-CM

## 2019-06-02 ENCOUNTER — Ambulatory Visit (INDEPENDENT_AMBULATORY_CARE_PROVIDER_SITE_OTHER): Payer: Medicare Other | Admitting: Podiatry

## 2019-06-02 ENCOUNTER — Encounter: Payer: Self-pay | Admitting: Podiatry

## 2019-06-02 ENCOUNTER — Other Ambulatory Visit: Payer: Self-pay

## 2019-06-02 VITALS — Temp 97.5°F

## 2019-06-02 DIAGNOSIS — L84 Corns and callosities: Secondary | ICD-10-CM | POA: Diagnosis not present

## 2019-06-02 DIAGNOSIS — B351 Tinea unguium: Secondary | ICD-10-CM

## 2019-06-02 DIAGNOSIS — M79675 Pain in left toe(s): Secondary | ICD-10-CM

## 2019-06-02 DIAGNOSIS — G629 Polyneuropathy, unspecified: Secondary | ICD-10-CM | POA: Diagnosis not present

## 2019-06-02 DIAGNOSIS — M79674 Pain in right toe(s): Secondary | ICD-10-CM | POA: Diagnosis not present

## 2019-06-02 NOTE — Patient Instructions (Signed)

## 2019-06-03 NOTE — Progress Notes (Signed)
Subjective: Wayde Gopaul presents today for follow up of painful porokeratotic lesion(s) plantar aspect of both feet and painful mycotic toenails b/l that limit ambulation. Aggravating factors include weightbearing with and without shoe gear. Pain for both is relieved with periodic professional debridement..   Allergies  Allergen Reactions  . Amoxicillin Rash    Has patient had a PCN reaction causing immediate rash, facial/tongue/throat swelling, SOB or lightheadedness with hypotension:No Has patient had a PCN reaction causing severe rash involving mucus membranes or skin necrosis:unsure Has patient had a PCN reaction that required hospitalization:No Has patient had a PCN reaction occurring within the last 10 years:unsure If all of the above answers are "NO", then may proceed with Cephalosporin use.   . Ciprofloxacin Swelling and Rash  . Sulfasalazine Swelling  . Sulfa Antibiotics Swelling  . Doxycycline Hyclate Rash  . Levofloxacin Rash     Objective: Vitals:   06/02/19 1547  Temp: (!) 97.5 F (36.4 C)    Vascular Examination:  Capillary refill time to digits immediate b/l, palpable DP pulses b/l, palpable PT pulses b/l, pedal hair absent b/l and skin temperature gradient within normal limits b/l  Dermatological Examination: Pedal skin with normal turgor, texture and tone bilaterally, toenails 1-5 b/l elongated, dystrophic, thickened, crumbly with subungual debris, hyperkeratotic lesion(s) b/l hallux.  No erythema, no edema, no drainage, no flocculence and porokeratotic lesion(s) submet heads 1, 5 b/l with subdermal hemorrhage. No erythema, no edema, no drainage, no flocculence  Musculoskeletal: Normal muscle strength 5/5 to all lower extremity muscle groups bilaterally, no pain crepitus or joint limitation noted with ROM b/l and plantarflexed metatarsals 1, 5 b/l  Neurological: Protective sensation intact 5/5 intact bilaterally with 10g monofilament b/l and vibratory  sensation intact b/l  Assessment: 1. Pain due to onychomycosis of toenails of both feet   2. Pre-ulcerative calluses   3. Neuropathy    Plan: -Toenails 1-5 b/l were debrided in length and girth with sterile nail nippers and dremel without iatrogenic bleeding. -Calluses were debrided without complication or incident. Total number debrided =6, b/l hallux, submet heads 1, 5 b/l. -Patient to continue soft, supportive shoe gear daily. -Patient/POA to call should there be question/concern in the interim.  Return in about 9 weeks (around 08/04/2019) for nail and callus trim.

## 2019-06-08 DIAGNOSIS — H26491 Other secondary cataract, right eye: Secondary | ICD-10-CM | POA: Diagnosis not present

## 2019-06-08 DIAGNOSIS — H524 Presbyopia: Secondary | ICD-10-CM | POA: Diagnosis not present

## 2019-06-08 DIAGNOSIS — H10413 Chronic giant papillary conjunctivitis, bilateral: Secondary | ICD-10-CM | POA: Diagnosis not present

## 2019-06-09 ENCOUNTER — Telehealth: Payer: Self-pay | Admitting: Internal Medicine

## 2019-06-09 NOTE — Telephone Encounter (Signed)
Pt wife wants to know if there is anything pt can take over the counter for his nausea and burping. Please give pt wife a call back.

## 2019-06-13 NOTE — Telephone Encounter (Signed)
Wife is aware

## 2019-06-19 ENCOUNTER — Encounter: Payer: Self-pay | Admitting: Internal Medicine

## 2019-06-19 DIAGNOSIS — Z800 Family history of malignant neoplasm of digestive organs: Secondary | ICD-10-CM | POA: Diagnosis not present

## 2019-06-19 DIAGNOSIS — K6289 Other specified diseases of anus and rectum: Secondary | ICD-10-CM | POA: Diagnosis not present

## 2019-06-19 DIAGNOSIS — D123 Benign neoplasm of transverse colon: Secondary | ICD-10-CM | POA: Diagnosis not present

## 2019-06-19 DIAGNOSIS — K648 Other hemorrhoids: Secondary | ICD-10-CM | POA: Diagnosis not present

## 2019-06-19 DIAGNOSIS — K635 Polyp of colon: Secondary | ICD-10-CM | POA: Diagnosis not present

## 2019-06-19 DIAGNOSIS — Z1211 Encounter for screening for malignant neoplasm of colon: Secondary | ICD-10-CM | POA: Diagnosis not present

## 2019-06-19 DIAGNOSIS — Z86010 Personal history of colonic polyps: Secondary | ICD-10-CM | POA: Diagnosis not present

## 2019-06-30 ENCOUNTER — Other Ambulatory Visit: Payer: Self-pay

## 2019-07-03 ENCOUNTER — Other Ambulatory Visit: Payer: Self-pay

## 2019-07-03 ENCOUNTER — Other Ambulatory Visit (INDEPENDENT_AMBULATORY_CARE_PROVIDER_SITE_OTHER): Payer: Medicare Other

## 2019-07-03 DIAGNOSIS — R899 Unspecified abnormal finding in specimens from other organs, systems and tissues: Secondary | ICD-10-CM | POA: Diagnosis not present

## 2019-07-03 LAB — BASIC METABOLIC PANEL
BUN: 46 mg/dL — ABNORMAL HIGH (ref 6–23)
CO2: 24 mEq/L (ref 19–32)
Calcium: 9.4 mg/dL (ref 8.4–10.5)
Chloride: 108 mEq/L (ref 96–112)
Creatinine, Ser: 2.08 mg/dL — ABNORMAL HIGH (ref 0.40–1.50)
GFR: 30.3 mL/min — ABNORMAL LOW (ref >60.00)
Glucose, Bld: 96 mg/dL (ref 70–99)
Potassium: 5.9 mEq/L — ABNORMAL HIGH (ref 3.5–5.1)
Sodium: 139 mEq/L (ref 135–145)

## 2019-07-04 ENCOUNTER — Encounter: Payer: Self-pay | Admitting: Internal Medicine

## 2019-07-04 ENCOUNTER — Other Ambulatory Visit: Payer: Self-pay | Admitting: Internal Medicine

## 2019-07-04 DIAGNOSIS — E875 Hyperkalemia: Secondary | ICD-10-CM

## 2019-07-04 DIAGNOSIS — R944 Abnormal results of kidney function studies: Secondary | ICD-10-CM

## 2019-07-04 MED ORDER — SODIUM POLYSTYRENE SULFONATE PO POWD: Freq: Once | ORAL | 0 refills | Status: AC

## 2019-07-06 ENCOUNTER — Telehealth: Payer: Self-pay | Admitting: Internal Medicine

## 2019-07-06 DIAGNOSIS — R944 Abnormal results of kidney function studies: Secondary | ICD-10-CM

## 2019-07-06 NOTE — Telephone Encounter (Signed)
Ok to order 

## 2019-07-06 NOTE — Telephone Encounter (Signed)
Benjamin Cardenas from [facility] stated that her provider wants the pt to have a Renal Ultrasound done.    She can be reached at [phone]

## 2019-07-06 NOTE — Telephone Encounter (Signed)
Wife is aware that an order has been placed.

## 2019-07-10 ENCOUNTER — Other Ambulatory Visit: Payer: Self-pay

## 2019-07-11 ENCOUNTER — Other Ambulatory Visit (INDEPENDENT_AMBULATORY_CARE_PROVIDER_SITE_OTHER): Payer: Medicare Other

## 2019-07-11 DIAGNOSIS — E875 Hyperkalemia: Secondary | ICD-10-CM | POA: Diagnosis not present

## 2019-07-11 LAB — BASIC METABOLIC PANEL
BUN: 50 mg/dL — ABNORMAL HIGH (ref 6–23)
CO2: 27 mEq/L (ref 19–32)
Calcium: 9.3 mg/dL (ref 8.4–10.5)
Chloride: 97 mEq/L (ref 96–112)
Creatinine, Ser: 2.27 mg/dL — ABNORMAL HIGH (ref 0.40–1.50)
GFR: 27.39 mL/min — ABNORMAL LOW (ref >60.00)
Glucose, Bld: 92 mg/dL (ref 70–99)
Potassium: 5 mEq/L (ref 3.5–5.1)
Sodium: 133 mEq/L — ABNORMAL LOW (ref 135–145)

## 2019-07-12 DIAGNOSIS — N400 Enlarged prostate without lower urinary tract symptoms: Secondary | ICD-10-CM | POA: Diagnosis not present

## 2019-07-12 DIAGNOSIS — R413 Other amnesia: Secondary | ICD-10-CM | POA: Diagnosis not present

## 2019-07-12 DIAGNOSIS — D631 Anemia in chronic kidney disease: Secondary | ICD-10-CM | POA: Diagnosis not present

## 2019-07-12 DIAGNOSIS — E875 Hyperkalemia: Secondary | ICD-10-CM | POA: Diagnosis not present

## 2019-07-12 DIAGNOSIS — N390 Urinary tract infection, site not specified: Secondary | ICD-10-CM | POA: Diagnosis not present

## 2019-07-12 DIAGNOSIS — N1832 Chronic kidney disease, stage 3b: Secondary | ICD-10-CM | POA: Diagnosis not present

## 2019-07-12 DIAGNOSIS — N179 Acute kidney failure, unspecified: Secondary | ICD-10-CM | POA: Diagnosis not present

## 2019-07-13 ENCOUNTER — Ambulatory Visit
Admission: RE | Admit: 2019-07-13 | Discharge: 2019-07-13 | Disposition: A | Discharge status: Home / Self Care | Payer: Medicare Other | Source: Ambulatory Visit | Attending: Internal Medicine | Admitting: Internal Medicine

## 2019-07-13 DIAGNOSIS — N1330 Unspecified hydronephrosis: Secondary | ICD-10-CM | POA: Diagnosis not present

## 2019-07-13 DIAGNOSIS — R944 Abnormal results of kidney function studies: Secondary | ICD-10-CM

## 2019-07-14 ENCOUNTER — Other Ambulatory Visit: Payer: Self-pay

## 2019-07-14 ENCOUNTER — Encounter (HOSPITAL_COMMUNITY): Payer: Self-pay

## 2019-07-14 ENCOUNTER — Emergency Department (HOSPITAL_COMMUNITY)
Admission: EM | Admit: 2019-07-14 | Discharge: 2019-07-14 | Disposition: A | Discharge status: Home / Self Care | Payer: Medicare Other | Attending: Emergency Medicine | Admitting: Emergency Medicine

## 2019-07-14 DIAGNOSIS — N289 Disorder of kidney and ureter, unspecified: Secondary | ICD-10-CM

## 2019-07-14 DIAGNOSIS — R339 Retention of urine, unspecified: Secondary | ICD-10-CM | POA: Diagnosis not present

## 2019-07-14 DIAGNOSIS — D090 Carcinoma in situ of bladder: Secondary | ICD-10-CM | POA: Insufficient documentation

## 2019-07-14 DIAGNOSIS — Z7982 Long term (current) use of aspirin: Secondary | ICD-10-CM | POA: Diagnosis not present

## 2019-07-14 DIAGNOSIS — N401 Enlarged prostate with lower urinary tract symptoms: Secondary | ICD-10-CM | POA: Diagnosis not present

## 2019-07-14 DIAGNOSIS — Z79899 Other long term (current) drug therapy: Secondary | ICD-10-CM | POA: Diagnosis not present

## 2019-07-14 DIAGNOSIS — Z87891 Personal history of nicotine dependence: Secondary | ICD-10-CM | POA: Diagnosis not present

## 2019-07-14 LAB — CBC WITH DIFFERENTIAL/PLATELET
Abs Immature Granulocytes: 0.02 10*3/uL (ref 0.00–0.07)
Basophils Absolute: 0 10*3/uL (ref 0.0–0.1)
Basophils Relative: 0 %
Eosinophils Absolute: 0.1 10*3/uL (ref 0.0–0.5)
Eosinophils Relative: 1 %
HCT: 36.6 % — ABNORMAL LOW (ref 39.0–52.0)
Hemoglobin: 12.4 g/dL — ABNORMAL LOW (ref 13.0–17.0)
Immature Granulocytes: 0 %
Lymphocytes Relative: 11 %
Lymphs Abs: 1 10*3/uL (ref 0.7–4.0)
MCH: 30.7 pg (ref 26.0–34.0)
MCHC: 33.9 g/dL (ref 30.0–36.0)
MCV: 90.6 fL (ref 80.0–100.0)
Monocytes Absolute: 0.5 10*3/uL (ref 0.1–1.0)
Monocytes Relative: 6 %
Neutro Abs: 7.4 10*3/uL (ref 1.7–7.7)
Neutrophils Relative %: 82 %
Platelets: 268 10*3/uL (ref 150–400)
RBC: 4.04 MIL/uL — ABNORMAL LOW (ref 4.22–5.81)
RDW: 12.8 % (ref 11.5–15.5)
WBC: 9.1 10*3/uL (ref 4.0–10.5)
nRBC: 0 % (ref 0.0–0.2)

## 2019-07-14 LAB — BASIC METABOLIC PANEL
Anion gap: 11 (ref 5–15)
BUN: 59 mg/dL — ABNORMAL HIGH (ref 8–23)
CO2: 22 mmol/L (ref 22–32)
Calcium: 9.4 mg/dL (ref 8.9–10.3)
Chloride: 97 mmol/L — ABNORMAL LOW (ref 98–111)
Creatinine, Ser: 2.55 mg/dL — ABNORMAL HIGH (ref 0.61–1.24)
GFR calc Af Amer: 25 mL/min — ABNORMAL LOW (ref >60)
GFR calc non Af Amer: 22 mL/min — ABNORMAL LOW (ref >60)
Glucose, Bld: 104 mg/dL — ABNORMAL HIGH (ref 70–99)
Potassium: 4.7 mmol/L (ref 3.5–5.1)
Sodium: 130 mmol/L — ABNORMAL LOW (ref 135–145)

## 2019-07-14 LAB — URINALYSIS, ROUTINE W REFLEX MICROSCOPIC
Bilirubin Urine: NEGATIVE
Glucose, UA: NEGATIVE mg/dL
Ketones, ur: NEGATIVE mg/dL
Nitrite: NEGATIVE
Protein, ur: NEGATIVE mg/dL
Specific Gravity, Urine: 1.005 (ref 1.005–1.030)
pH: 6 (ref 5.0–8.0)

## 2019-07-14 MED ORDER — LIDOCAINE HCL URETHRAL/MUCOSAL 2 % EX GEL
1.0000 "application " | Freq: Once | CUTANEOUS | Status: AC
  Administered 2019-07-14: 1 via TOPICAL
  Filled 2019-07-14: qty 30

## 2019-07-14 NOTE — Discharge Instructions (Signed)
Keep catheter in place until you see your urologist.  Your kidney function is slightly abnormal and you will need to repeat kidney function in a week.  We send off urine culture and you will be called if there is any bacteria in it.  Return to ER if you have severe pain around the catheter site, belly is not draining, blood clots in the catheter, fever

## 2019-07-14 NOTE — Telephone Encounter (Signed)
Pt's wife, Pamala Hurry, states that [facility] Dr. Julien Girt has not received the results from the Ultrasound on 4/8. Pamala Hurry states she sees the results on my-chart but Dr. Royce Macadamia needs it faxed over.  She also wanted to let the PCP know that Dr. Royce Macadamia stated he has a kidney infection and to continue the low-potassium diet.    Fax: [phone]

## 2019-07-14 NOTE — Telephone Encounter (Signed)
Dr Jerilee Hoh is aware.  Ultrasound results have been efaxed.

## 2019-07-14 NOTE — ED Provider Notes (Signed)
[place] DEPT Provider Note   CSN: [id number] Arrival date & time: 07/14/19  1906     History Chief Complaint  Patient presents with  . Urinary Retention    Sent for foley placement    Benjamin Cardenas is a 86 y.o. male hx of BPH, bladder cancer, who presenting with urinary retention.  Patient states that he has difficulty urinating at baseline.  He recently had worsening renal function as well as hyperkalemia so saw nephrology and had ultrasound that showed left-sided hydro-.  He states that he has been urinating well less and has some suprapubic pain.  He sent in for Foley catheter.  The history is provided by the patient.       Past Medical History:  Diagnosis Date  . BPH with urinary obstruction   . Chronic constipation   . Chronic low back pain   . Cognitive impairment   . DOE (dyspnea on exertion)   . Dry skin dermatitis   . Elevated PSA   . Foley catheter in place    since 08-13-2018  . History of bladder cancer    urologist--- dr Consuella Lose  . History of colon polyps   . History of sepsis    previous admission's for sepsis due to UTI's and CAP:   last admission's 05/ 2020 and 06/ 2020 sepsis secondary to UTI  . History of supraventricular tachycardia 11-03-2018  pt denies any symptoms since 2018   03/ 2018 during admission for CAP with sepsis-- episode of SVT;  pt cardiac evaluation after discharge 08-17-2016 (note in epic, b. bhagat PA),  normal echo,  event montior no sig. arrrhythmias heart rate range 36-120  . IDA (iron deficiency anemia)   . Lower urinary tract symptoms (LUTS)   . Lumbar stenosis   . OA (osteoarthritis)   . Prostate nodule   . Seasonal allergic rhinitis   . Urinary retention   . Wears glasses     Patient Active Problem List   Diagnosis Date Noted  . Hyperkalemia 07/04/2019  . Vitamin D deficiency 01/27/2019  . BPH with urinary obstruction 11/07/2018  . Mild renal insufficiency 09/21/2018  . Sepsis due to  urinary tract infection ([place]) 08/14/2018  . Sepsis secondary to UTI ([place]) 08/13/2018  . AKI (acute kidney injury) ([place]) 08/13/2018  . Acute encephalopathy 08/13/2018  . Bladder outlet obstruction 08/13/2018  . AAA (abdominal aortic aneurysm) without rupture ([place]) 08/13/2018  . MRSA (methicillin resistant Staphylococcus aureus) carrier 05/30/2018  . Bladder cancer ([place]) 03/10/2018  . CAP (community acquired pneumonia) 06/26/2016  . Hyponatremia 12/13/2015  . Anemia 12/13/2015  . Altered mental status 12/13/2015  . Constipation 08/17/2015  . Memory impairment 12/04/2014  . Keratosis pilaris 02/25/2011  . 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 Inguinal hernia unilateral, non-recurrent, left 01/28/2011  . OTHER SYMPTOMS INVOLVING CARDIOVASCULAR SYSTEM 09/20/2009  . Osteoarthritis 01/31/2009  . SCIATICA, ACUTE 12/20/2007  . NECK PAIN 02/15/2007  . BPH (benign prostatic hyperplasia) 01/06/2007  . History of colonic polyps 01/06/2007    Past Surgical History:  Procedure Laterality Date  . CATARACT EXTRACTION W/ INTRAOCULAR LENS  IMPLANT, BILATERAL  09/2017  . cataract surgery    . CYSTOSCOPY WITH BIOPSY N/A 04/22/2015   Procedure: CYSTOSCOPY WITH BLADDER BIOPSY;  Surgeon: Kathie Rhodes, MD;  Location[facility];  Service: Urology;  Laterality: N/A;  . CYSTOSCOPY WITH RETROGRADE PYELOGRAM, URETEROSCOPY AND STENT PLACEMENT Left 02/25/2015   Procedure: LEFT RETROGRADE PYELOGRAM, URETEROSCOPY ;  Surgeon: Kathie Rhodes,  MD;  Location: [place];  Service: Urology;  Laterality: Left;  . HERNIA REPAIR  age 19 1937   rupture right inguinal hernia  . INGUINAL HERNIA REPAIR  03/02/2011   Procedure: HERNIA REPAIR INGUINAL ADULT;  Surgeon: Earnstine Regal, MD;  Location: [facility];  Service: General;  Laterality: Left;  with mesh   . TRANSURETHRAL RESECTION OF BLADDER TUMOR WITH GYRUS (TURBT-GYRUS) N/A 02/25/2015   Procedure: TRANSURETHRAL RESECTION OF BLADDER TUMOR WITH  GYRUS (TURBT-GYRUS);  Surgeon: Kathie Rhodes, MD;  Location: [facility];  Service: Urology;  Laterality: N/A;  . TRANSURETHRAL RESECTION OF PROSTATE N/A 11/07/2018   Procedure: TRANSURETHRAL RESECTION OF THE PROSTATE (TURP)/ BLADDER BIOPSY;  Surgeon: Kathie Rhodes, MD;  Location: [place];  Service: Urology;  Laterality: N/A;       Family History  Problem Relation Age of Onset  . Heart disease Mother   . Heart disease Father   . Cancer Sister        breast  . Cancer Brother        colon    Social History   Tobacco Use  . Smoking status: Former Smoker    Packs/day: 1.50    Years: 34.00    Pack years: 51.00    Types: Cigarettes    Quit date: 02/17/1979    Years since quitting: 40.4  . Smokeless tobacco: Never Used  Substance Use Topics  . Alcohol use: Yes    Comment: occasional  . Drug use: No    Home Medications Prior to Admission medications   Medication Sig Start Date End Date Taking? Authorizing Provider  aspirin EC 81 MG tablet Take 81 mg by mouth daily.     [provider]  Calcium Glycerophosphate (PRELIEF PO) Take by mouth. OTC - Bladder irritation    [provider]  cetirizine (ZYRTEC) 10 MG tablet Take 10 mg by mouth daily.     [provider]  DETROL LA 4 MG 24 hr capsule Take 4 mg by mouth daily. 05/30/19   [provider]  naphazoline-pheniramine (NAPHCON-A) 0.025-0.3 % ophthalmic solution Place 1 drop into both eyes 3 (three) times daily.     [provider]  OVER THE COUNTER MEDICATION D-Mannose powder - urinary tract health    [provider]  triamcinolone cream (KENALOG) 0.1 % APPLY ONE APPLICATION TO THE AFFECTED AREA 2 TIMES DAILY AS NEEDED FOR 14 DAYS 12/07/18   [provider]  TRULANCE 3 MG TABS Take 3 mg by mouth every morning.  10/29/15   [provider]  vitamin B-12 1000 MCG tablet Take 1 tablet (1,000 mcg total) by mouth daily. 09/24/18   Raiford Noble Latif, DO    Allergies    Amoxicillin, Ciprofloxacin, Sulfasalazine, Sulfa antibiotics, Doxycycline hyclate, and Levofloxacin  Review of Systems   Review of Systems  Genitourinary: Positive for difficulty urinating.  All other systems reviewed and are negative.   Physical Exam Updated Vital Signs BP 123/80 (BP Location: Left Arm)   Pulse 81   Temp 98.2 F (36.8 C) (Oral)   Resp 17   SpO2 100%   Physical Exam Vitals and nursing note reviewed.  HENT:     Head: Normocephalic.     Nose: Nose normal.     Mouth/Throat:     Mouth: Mucous membranes are moist.  Eyes:     Extraocular Movements: Extraocular movements intact.     Pupils: Pupils are equal, round, and reactive  to light.  Cardiovascular:     Rate and Rhythm: Normal rate and regular rhythm.     Pulses: Normal pulses.     Heart sounds: Normal heart sounds.  Pulmonary:     Effort: Pulmonary effort is normal.  Abdominal:     General: Abdomen is flat.     Comments: + suprapubic tenderness, bladder distended on exam, no CVAT   Musculoskeletal:        General: Normal range of motion.     Cervical back: Normal range of motion.  Skin:    General: Skin is warm.     Capillary Refill: Capillary refill takes less than 2 seconds.  Neurological:     General: No focal deficit present.     Mental Status: He is alert and oriented to person, place, and time.  Psychiatric:        Mood and Affect: Mood normal.     ED Results / Procedures / Treatments   Labs (all labs ordered are listed, but only abnormal results are displayed) Labs Reviewed  URINALYSIS, ROUTINE W REFLEX MICROSCOPIC - Abnormal; Notable for the following components:      Result Value   Color, Urine STRAW (*)    Hgb urine dipstick MODERATE (*)    Leukocytes,Ua LARGE (*)    Bacteria, UA RARE (*)    All other components within normal limits  CBC WITH DIFFERENTIAL/PLATELET - Abnormal; Notable for the following components:   RBC 4.04 (*)    Hemoglobin  12.4 (*)    HCT 36.6 (*)    All other components within normal limits  BASIC METABOLIC PANEL - Abnormal; Notable for the following components:   Sodium 130 (*)    Chloride 97 (*)    Glucose, Bld 104 (*)    BUN 59 (*)    Creatinine, Ser 2.55 (*)    GFR calc non Af Amer 22 (*)    GFR calc Af Amer 25 (*)    All other components within normal limits  URINE CULTURE    EKG None  Radiology US Renal  Result Date: 07/13/2019 CLINICAL DATA:  Abnormal renal function tests EXAM: RENAL / URINARY TRACT ULTRASOUND COMPLETE COMPARISON:  09/22/2018 FINDINGS: Right Kidney: Renal measurements: 0.3 x 5.3 x 6.7 cm. = volume: 189 mL. Significant hydronephrosis is noted which persists following voiding. Left Kidney: Renal measurements: 12.4 x 7.2 x 8.1 cm. = volume: 380 mL. Significant hydronephrosis which persists following voiding. Bladder: Appears normal for degree of bladder distention. Other: None. IMPRESSION: Persistent significant hydronephrosis which persists following voiding. These changes are stable dating back to May of 2020. Electronically Signed   By: Inez Catalina M.D.   On: 07/13/2019 15:20    Procedures BLADDER CATHETERIZATION  Date/Time: 07/14/2019 10:28 PM Performed by: Drenda Freeze, MD Authorized by: Drenda Freeze, MD   Consent:    Consent obtained:  Verbal   Consent given by:  Patient   Risks discussed:  Urethral injury   Alternatives discussed:  No treatment Pre-procedure details:    Procedure purpose:  Diagnostic   Preparation: Patient was prepped and draped in usual sterile fashion   Anesthesia (see MAR for exact dosages):    Anesthesia method:  Topical application   Topical anesthetic:  Lidocaine gel Procedure details:    Provider performed due to:  Complicated insertion   Catheter insertion:  Indwelling   Catheter type:  Coude   Catheter size:  16 Fr   Bladder irrigation: no  Number of attempts:  1   Urine characteristics:  Clear Post-procedure details:      Patient tolerance of procedure:  Tolerated well, no immediate complications   (including critical care time)   Medications Ordered in ED Medications  lidocaine (XYLOCAINE) 2 % jelly 1 application (1 application Topical Given 07/14/19 2113)    ED Course  I have reviewed the triage vital signs and the nursing notes.  Pertinent labs & imaging results that were available during my care of the patient were reviewed by me and considered in my medical decision making (see chart for details).    MDM Rules/Calculators/A&P                      Benjamin Cardenas is a 86 y.o. male her presenting with difficulty urinating.  I reviewed nursing notes.  Patient had a bladder scan in the ED that showed bladder volume of 500.  Also reviewed outpatient notes from several days ago and patient has left-sided hydronephrosis likely from reflux and urinary retention.  He has a urine culture that was negative.  Patient is known to have difficulty with catheter previously and required a coud catheter.  Will place daily catheter and get labs and UA and urine culture.  10:28 PM 16 F coude catheter placed by me.  About 500 cc of clear urine came out.  Urinalysis showed no obvious bacteria.  Send off urine culture and wife asked me to hold off antibiotics right now.  His creatinine is now 2.55 from 2.2 .  I told patient to keep the Foley in and follow-up with his urologist.  He will need a repeat kidney function as well in a week.  Final Clinical Impression(s) / ED Diagnoses Final diagnoses:  None    Rx / DC Orders ED Discharge Orders    None       Drenda Freeze, MD 07/14/19 2229

## 2019-07-14 NOTE — ED Triage Notes (Signed)
Patient sent by his kidney specialist after an ultrasound resulting showing "significant hydronephrosis following voiding". Sent for a foley cath insertion. Patient has had one in the past and needed a coude.

## 2019-07-16 LAB — URINE CULTURE: Culture: NO GROWTH

## 2019-07-17 ENCOUNTER — Telehealth: Payer: Self-pay | Admitting: Internal Medicine

## 2019-07-17 NOTE — Telephone Encounter (Signed)
Spoke with the pts wife and informed her of the message below. 

## 2019-07-17 NOTE — Telephone Encounter (Signed)
ADDENDUM: Benjamin Cardenas stated the pt denies a fever or abdominal pain also.

## 2019-07-17 NOTE — Telephone Encounter (Signed)
Pt's spouse, Pamala Hurry, stated he had to go to the ED this past Friday due to the results of his urine test. She states the kidney doctor said he has kidney failure. He has not had a bowel movement since being put on a catheter. She would like advice on getting him to have a bowel movement. They have tried hot tea and miralax, and lots of liquid but nothing has helped. It is causing his back to hurt.   She also said he is still taking trulance too.   Pamala Hurry can be reached at [phone]

## 2019-07-17 NOTE — Telephone Encounter (Signed)
*  please make sure no fever/abdominal pain? If so needs to be evaluated. *can add colace or dulcolax by mouth twice daily, keep taking miralax and would be ok to do the miralax twice daily today and tomorrow if no BM with colace addition. If no BM by tomorrow please let us know.

## 2019-07-18 ENCOUNTER — Telehealth: Payer: Self-pay | Admitting: Physician Assistant

## 2019-07-18 ENCOUNTER — Telehealth: Payer: Self-pay | Admitting: Internal Medicine

## 2019-07-18 NOTE — Telephone Encounter (Signed)
Called pt and there was no answer and no machine.

## 2019-07-18 NOTE — Telephone Encounter (Signed)
Thank you for the update!

## 2019-07-18 NOTE — Telephone Encounter (Signed)
Pamala Hurry the patient's wife is calling requesting Vin Bhagat look over the patient's chart due to recently finding out Shuayb is in kidney failure. She would like a callback in regards to what Bhagat advises and if he feels an appointment to come in and be seen is necessary. Please advise.

## 2019-07-18 NOTE — Telephone Encounter (Signed)
The patients wife called to let Dr. Jerilee Hoh know that the patient is having repeat labs on Tuesday at [facility] and seeing the urologist on 21st at 10:45 to repeat ultrasound and to void the bladder. Also the patient now has a catheter because he has kidney failure.

## 2019-07-24 NOTE — Telephone Encounter (Signed)
2nd attempt to return call to pt, no answer no machine.

## 2019-07-25 ENCOUNTER — Telehealth: Payer: Self-pay | Admitting: *Deleted

## 2019-07-25 DIAGNOSIS — R338 Other retention of urine: Secondary | ICD-10-CM | POA: Diagnosis not present

## 2019-07-25 NOTE — Telephone Encounter (Signed)
Pt's wife, Pamala Hurry states pt has been diagnosed with kidney failure and Dr. Elisha Ponder does work on pt's feet, will they need to get permission from the urologist.

## 2019-07-25 NOTE — Telephone Encounter (Signed)
I informed pt's wife, Pamala Hurry, our office takes care of many pts with kidney disease without referral from urology, but she may want to inform the urologist that he receives podiatric care.

## 2019-07-26 ENCOUNTER — Encounter: Payer: Self-pay | Admitting: Internal Medicine

## 2019-07-26 ENCOUNTER — Telehealth: Payer: Self-pay | Admitting: Internal Medicine

## 2019-07-26 DIAGNOSIS — N1339 Other hydronephrosis: Secondary | ICD-10-CM | POA: Diagnosis not present

## 2019-07-26 DIAGNOSIS — Z8551 Personal history of malignant neoplasm of bladder: Secondary | ICD-10-CM | POA: Diagnosis not present

## 2019-07-26 DIAGNOSIS — N184 Chronic kidney disease, stage 4 (severe): Secondary | ICD-10-CM | POA: Diagnosis not present

## 2019-07-26 DIAGNOSIS — R338 Other retention of urine: Secondary | ICD-10-CM | POA: Diagnosis not present

## 2019-07-26 NOTE — Telephone Encounter (Signed)
3rd attempt to reach pt / pt's wife.  No answer / no machine. Will close the encounter.

## 2019-07-26 NOTE — Telephone Encounter (Signed)
Pt's spouse, Pamala Hurry, stated he has a follow up appt with at [facility] and repeated the Kidney Ultrasound. He is scheduled for a procedure to determine the blockage that doesn't allow him to urinate along with his kidneys. She states she will send his OV notes through my-chart for Jerilee Hoh to review. She would like a call back from Apolonio Schneiders to discuss his procedure   Pamala Hurry can be reached at [phone]

## 2019-07-26 NOTE — Telephone Encounter (Signed)
Feel free to call back, but we cannot give details about procedure, this has to come from his urologist. Appreciate the update.

## 2019-07-27 NOTE — Telephone Encounter (Signed)
Spoke with patient and he will inform his wife.

## 2019-08-01 DIAGNOSIS — N1832 Chronic kidney disease, stage 3b: Secondary | ICD-10-CM | POA: Diagnosis not present

## 2019-08-02 ENCOUNTER — Other Ambulatory Visit: Payer: Self-pay | Admitting: Urology

## 2019-08-04 ENCOUNTER — Encounter (HOSPITAL_COMMUNITY): Payer: Self-pay | Admitting: Emergency Medicine

## 2019-08-04 ENCOUNTER — Emergency Department (HOSPITAL_COMMUNITY): Payer: Medicare Other

## 2019-08-04 ENCOUNTER — Emergency Department (HOSPITAL_COMMUNITY)
Admission: EM | Admit: 2019-08-04 | Discharge: 2019-08-04 | Disposition: A | Discharge status: Home / Self Care | Payer: Medicare Other | Attending: Emergency Medicine | Admitting: Emergency Medicine

## 2019-08-04 ENCOUNTER — Other Ambulatory Visit: Payer: Self-pay

## 2019-08-04 DIAGNOSIS — R109 Unspecified abdominal pain: Secondary | ICD-10-CM | POA: Diagnosis present

## 2019-08-04 DIAGNOSIS — N390 Urinary tract infection, site not specified: Secondary | ICD-10-CM | POA: Diagnosis not present

## 2019-08-04 DIAGNOSIS — N401 Enlarged prostate with lower urinary tract symptoms: Secondary | ICD-10-CM | POA: Insufficient documentation

## 2019-08-04 DIAGNOSIS — Z87891 Personal history of nicotine dependence: Secondary | ICD-10-CM | POA: Diagnosis not present

## 2019-08-04 DIAGNOSIS — K8020 Calculus of gallbladder without cholecystitis without obstruction: Secondary | ICD-10-CM | POA: Diagnosis not present

## 2019-08-04 DIAGNOSIS — Z8551 Personal history of malignant neoplasm of bladder: Secondary | ICD-10-CM | POA: Insufficient documentation

## 2019-08-04 DIAGNOSIS — N1330 Unspecified hydronephrosis: Secondary | ICD-10-CM | POA: Diagnosis not present

## 2019-08-04 DIAGNOSIS — Z79899 Other long term (current) drug therapy: Secondary | ICD-10-CM | POA: Insufficient documentation

## 2019-08-04 DIAGNOSIS — Z7982 Long term (current) use of aspirin: Secondary | ICD-10-CM | POA: Diagnosis not present

## 2019-08-04 LAB — URINALYSIS, ROUTINE W REFLEX MICROSCOPIC
Bilirubin Urine: NEGATIVE
Glucose, UA: NEGATIVE mg/dL
Ketones, ur: NEGATIVE mg/dL
Nitrite: NEGATIVE
Protein, ur: 100 mg/dL — AB
Specific Gravity, Urine: 1.011 (ref 1.005–1.030)
WBC, UA: >50 WBC/hpf — ABNORMAL HIGH (ref 0–5)
pH: 5 (ref 5.0–8.0)

## 2019-08-04 LAB — CBC
HCT: 35.9 % — ABNORMAL LOW (ref 39.0–52.0)
Hemoglobin: 11.6 g/dL — ABNORMAL LOW (ref 13.0–17.0)
MCH: 30.4 pg (ref 26.0–34.0)
MCHC: 32.3 g/dL (ref 30.0–36.0)
MCV: 94 fL (ref 80.0–100.0)
Platelets: 220 10*3/uL (ref 150–400)
RBC: 3.82 MIL/uL — ABNORMAL LOW (ref 4.22–5.81)
RDW: 14 % (ref 11.5–15.5)
WBC: 11.4 10*3/uL — ABNORMAL HIGH (ref 4.0–10.5)
nRBC: 0 % (ref 0.0–0.2)

## 2019-08-04 LAB — COMPREHENSIVE METABOLIC PANEL
ALT: 15 U/L (ref 0–44)
AST: 20 U/L (ref 15–41)
Albumin: 3.8 g/dL (ref 3.5–5.0)
Alkaline Phosphatase: 74 U/L (ref 38–126)
Anion gap: 10 (ref 5–15)
BUN: 33 mg/dL — ABNORMAL HIGH (ref 8–23)
CO2: 24 mmol/L (ref 22–32)
Calcium: 8.9 mg/dL (ref 8.9–10.3)
Chloride: 106 mmol/L (ref 98–111)
Creatinine, Ser: 1.79 mg/dL — ABNORMAL HIGH (ref 0.61–1.24)
GFR calc Af Amer: 39 mL/min — ABNORMAL LOW (ref >60)
GFR calc non Af Amer: 33 mL/min — ABNORMAL LOW (ref >60)
Glucose, Bld: 109 mg/dL — ABNORMAL HIGH (ref 70–99)
Potassium: 3.6 mmol/L (ref 3.5–5.1)
Sodium: 140 mmol/L (ref 135–145)
Total Bilirubin: 1 mg/dL (ref 0.3–1.2)
Total Protein: 7 g/dL (ref 6.5–8.1)

## 2019-08-04 LAB — LACTIC ACID, PLASMA: Lactic Acid, Venous: 1.3 mmol/L (ref 0.5–1.9)

## 2019-08-04 LAB — LIPASE, BLOOD: Lipase: 60 U/L — ABNORMAL HIGH (ref 11–51)

## 2019-08-04 MED ORDER — CEPHALEXIN 500 MG PO CAPS
500.0000 mg | ORAL_CAPSULE | Freq: Four times a day (QID) | ORAL | 0 refills | Status: DC
Start: 2019-08-04 — End: 2019-08-06

## 2019-08-04 MED ORDER — SODIUM CHLORIDE 0.9 % IV SOLN
1.0000 g | Freq: Once | INTRAVENOUS | Status: AC
  Administered 2019-08-04: 1 g via INTRAVENOUS
  Filled 2019-08-04: qty 10

## 2019-08-04 NOTE — ED Provider Notes (Signed)
[facility] DEPT Provider Note   CSN: [id number] Arrival date & time: 08/04/19  1243     History Chief Complaint  Patient presents with  . Flank Pain    Benjamin Cardenas is a 86 y.o. male.  The history is provided by the patient, the spouse and medical records. No language interpreter was used.  Flank Pain     86 year old male with hx of bladder cancer s/p resection, chronic indwelling foley catheter, recurrent UTI present c/o flank pain.  History obtained through patient and through wife who is at bedside.  Yesterday afternoon patient developed pain to his right flank and his lower abdomen.  Pain is described as a sharp stabbing sensation, nonradiating, happened intermittently and he report having similar pain like this in the past.  He also endorsed some chills and his wife gave him some Tylenol for that.  He endorsed him nausea, some urinary discomfort and wife noticed that he is less responsive than usual.  He endorsed some mild headache.  He does not complain of any vomiting or diarrhea.  He is scheduled to have a cystoscopy with possible stent placement in May due to worsening renal function.  His urologist is Dr. Karsten Ro.     Past Medical History:  Diagnosis Date  . BPH with urinary obstruction   . Chronic constipation   . Chronic low back pain   . Cognitive impairment   . DOE (dyspnea on exertion)   . Dry skin dermatitis   . Elevated PSA   . Foley catheter in place    since 08-13-2018  . History of bladder cancer    urologist--- dr Consuella Lose  . History of colon polyps   . History of sepsis    previous admission's for sepsis due to UTI's and CAP:   last admission's 05/ 2020 and 06/ 2020 sepsis secondary to UTI  . History of supraventricular tachycardia 11-03-2018  pt denies any symptoms since 2018   03/ 2018 during admission for CAP with sepsis-- episode of SVT;  pt cardiac evaluation after discharge 08-17-2016 (note in epic, b. bhagat PA),   normal echo,  event montior no sig. arrrhythmias heart rate range 36-120  . IDA (iron deficiency anemia)   . Lower urinary tract symptoms (LUTS)   . Lumbar stenosis   . OA (osteoarthritis)   . Prostate nodule   . Seasonal allergic rhinitis   . Urinary retention   . Wears glasses     Patient Active Problem List   Diagnosis Date Noted  . Hyperkalemia 07/04/2019  . Vitamin D deficiency 01/27/2019  . BPH with urinary obstruction 11/07/2018  . Mild renal insufficiency 09/21/2018  . Sepsis due to urinary tract infection ([place]) 08/14/2018  . Sepsis secondary to UTI ([place]) 08/13/2018  . AKI (acute kidney injury) ([place]) 08/13/2018  . Acute encephalopathy 08/13/2018  . Bladder outlet obstruction 08/13/2018  . AAA (abdominal aortic aneurysm) without rupture ([place]) 08/13/2018  . MRSA (methicillin resistant Staphylococcus aureus) carrier 05/30/2018  . Bladder cancer ([place]) 03/10/2018  . CAP (community acquired pneumonia) 06/26/2016  . Hyponatremia 12/13/2015  . Anemia 12/13/2015  . Altered mental status 12/13/2015  . Constipation 08/17/2015  . Memory impairment 12/04/2014  . Keratosis pilaris 02/25/2011  . 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 Inguinal hernia unilateral, non-recurrent, left 01/28/2011  . OTHER SYMPTOMS INVOLVING CARDIOVASCULAR SYSTEM 09/20/2009  . Osteoarthritis 01/31/2009  . SCIATICA, ACUTE 12/20/2007  . NECK PAIN 02/15/2007  . BPH (benign prostatic hyperplasia) 01/06/2007  .  History of colonic polyps 01/06/2007    Past Surgical History:  Procedure Laterality Date  . CATARACT EXTRACTION W/ INTRAOCULAR LENS  IMPLANT, BILATERAL  09/2017  . cataract surgery    . CYSTOSCOPY WITH BIOPSY N/A 04/22/2015   Procedure: CYSTOSCOPY WITH BLADDER BIOPSY;  Surgeon: Kathie Rhodes, MD;  Location[facility];  Service: Urology;  Laterality: N/A;  . CYSTOSCOPY WITH RETROGRADE PYELOGRAM, URETEROSCOPY AND STENT PLACEMENT Left 02/25/2015   Procedure: LEFT RETROGRADE  PYELOGRAM, URETEROSCOPY ;  Surgeon: Kathie Rhodes, MD;  Location: [facility];  Service: Urology;  Laterality: Left;  . HERNIA REPAIR  age 31 1937   rupture right inguinal hernia  . INGUINAL HERNIA REPAIR  03/02/2011   Procedure: HERNIA REPAIR INGUINAL ADULT;  Surgeon: Earnstine Regal, MD;  Location: [facility];  Service: General;  Laterality: Left;  with mesh   . TRANSURETHRAL RESECTION OF BLADDER TUMOR WITH GYRUS (TURBT-GYRUS) N/A 02/25/2015   Procedure: TRANSURETHRAL RESECTION OF BLADDER TUMOR WITH GYRUS (TURBT-GYRUS);  Surgeon: Kathie Rhodes, MD;  Location: [facility];  Service: Urology;  Laterality: N/A;  . TRANSURETHRAL RESECTION OF PROSTATE N/A 11/07/2018   Procedure: TRANSURETHRAL RESECTION OF THE PROSTATE (TURP)/ BLADDER BIOPSY;  Surgeon: Kathie Rhodes, MD;  Location: [place];  Service: Urology;  Laterality: N/A;       Family History  Problem Relation Age of Onset  . Heart disease Mother   . Heart disease Father   . Cancer Sister        breast  . Cancer Brother        colon    Social History   Tobacco Use  . Smoking status: Former Smoker    Packs/day: 1.50    Years: 34.00    Pack years: 51.00    Types: Cigarettes    Quit date: 02/17/1979    Years since quitting: 40.4  . Smokeless tobacco: Never Used  Substance Use Topics  . Alcohol use: Yes    Comment: occasional  . Drug use: No    Home Medications Prior to Admission medications   Medication Sig Start Date End Date Taking? Authorizing Provider  aspirin EC 81 MG tablet Take 81 mg by mouth daily.     [provider]  Calcium Glycerophosphate (PRELIEF PO) Take by mouth. OTC - Bladder irritation    [provider]  cetirizine (ZYRTEC) 10 MG tablet Take 10 mg by mouth daily.     [provider]  DETROL LA 4 MG 24 hr capsule Take 4 mg by mouth daily. 05/30/19   [provider]  naphazoline-pheniramine (NAPHCON-A) 0.025-0.3 % ophthalmic solution  Place 1 drop into both eyes 3 (three) times daily.     [provider]  OVER THE COUNTER MEDICATION D-Mannose powder - urinary tract health    [provider]  triamcinolone cream (KENALOG) 0.1 % APPLY ONE APPLICATION TO THE AFFECTED AREA 2 TIMES DAILY AS NEEDED FOR 14 DAYS 12/07/18   [provider]  TRULANCE 3 MG TABS Take 3 mg by mouth every morning.  10/29/15   [provider]  vitamin B-12 1000 MCG tablet Take 1 tablet (1,000 mcg total) by mouth daily. 09/24/18   Raiford Noble Latif, DO    Allergies    Amoxicillin, Ciprofloxacin, Sulfasalazine, Sulfa antibiotics, Doxycycline hyclate, and Levofloxacin  Review of Systems   Review of Systems  Genitourinary: Positive for flank pain.  All other systems reviewed and are negative.   Physical Exam Updated  Vital Signs BP 121/69   Pulse (!) 103   Temp 97.6 F (36.4 C) (Oral)   Resp 20   SpO2 97%   Physical Exam Vitals and nursing note reviewed.  Constitutional:      General: He is not in acute distress.    Appearance: He is well-developed.  HENT:     Head: Atraumatic.  Eyes:     Conjunctiva/sclera: Conjunctivae normal.  Cardiovascular:     Rate and Rhythm: Normal rate and regular rhythm.     Pulses: Normal pulses.     Heart sounds: Normal heart sounds.  Pulmonary:     Effort: Pulmonary effort is normal.     Breath sounds: Normal breath sounds.  Abdominal:     Palpations: Abdomen is soft.     Tenderness: There is abdominal tenderness (mild R flank and R abd pain, no guarding or rebound tenderness).  Genitourinary:    Penis: Normal.      Comments: Circumcised penis free or lesion or rash. Indwelling foley catheter in place.  Normal appearing urine color in foley bag.  Musculoskeletal:     Cervical back: Neck supple.  Skin:    General: Skin is warm.     Findings: No rash.  Neurological:     Mental Status: He is alert and oriented to person, place, and time.  Psychiatric:        Mood and  Affect: Mood normal.     ED Results / Procedures / Treatments   Labs (all labs ordered are listed, but only abnormal results are displayed) Labs Reviewed  LIPASE, BLOOD - Abnormal; Notable for the following components:      Result Value   Lipase 60 (*)    All other components within normal limits  COMPREHENSIVE METABOLIC PANEL - Abnormal; Notable for the following components:   Glucose, Bld 109 (*)    BUN 33 (*)    Creatinine, Ser 1.79 (*)    GFR calc non Af Amer 33 (*)    GFR calc Af Amer 39 (*)    All other components within normal limits  CBC - Abnormal; Notable for the following components:   WBC 11.4 (*)    RBC 3.82 (*)    Hemoglobin 11.6 (*)    HCT 35.9 (*)    All other components within normal limits  URINE CULTURE  LACTIC ACID, PLASMA  URINALYSIS, ROUTINE W REFLEX MICROSCOPIC  LACTIC ACID, PLASMA    EKG None  Radiology CT Renal Stone Study  Result Date: 08/04/2019 CLINICAL DATA:  Right flank pain. History of bladder cancer EXAM: CT ABDOMEN AND PELVIS WITHOUT CONTRAST TECHNIQUE: Multidetector CT imaging of the abdomen and pelvis was performed following the standard protocol without IV contrast. COMPARISON:  08/13/2018 FINDINGS: Lower chest: No acute abnormality. Hepatobiliary: No focal liver abnormality. Layering small stones within the gallbladder lumen. No pericholecystic inflammatory changes are evident by CT. No biliary dilatation. Pancreas: Unremarkable. No pancreatic ductal dilatation or surrounding inflammatory changes. Spleen: Normal in size without focal abnormality. Adrenals/Urinary Tract: Unremarkable adrenal glands. Severe bilateral hydroureteronephrosis, which may be minimally improved compared to prior CT 08/13/2018. No renal or ureteral calculi are seen. Urinary bladder wall appears diffusely thickened, although is largely decompressed by Foley catheter. A small amount of air within the bladder lumen is presumably catheter related. Stomach/Bowel: Stomach is  within normal limits. Appendix appears normal. Moderate volume of stool within the colon. No evidence of bowel wall thickening, distention, or inflammatory changes. Vascular/Lymphatic: Aortoiliac atherosclerosis. 3.1 cm  infrarenal abdominal aortic aneurysm, unchanged from prior. No abdominopelvic lymphadenopathy. Reproductive: No acute findings. Other: No abdominal wall hernia or abnormality. No abdominopelvic ascites. Musculoskeletal: Similar degenerative changes within the spine including grade 1 anterolisthesis L3 on L4 and L4 on L5. Bilateral hip osteoarthritis. No suspicious bone lesion. IMPRESSION: 1. Severe bilateral hydroureteronephrosis, which may be minimally improved compared to prior CT 08/13/2018. No renal or ureteral calculi are seen. 2. Urinary bladder wall appears mildly thickened, although is largely decompressed by Foley catheter. 3. Cholelithiasis without evidence of cholecystitis. 4. Stable 3.1 cm infrarenal abdominal aortic aneurysm. 5. Moderate volume of stool within the colon. 6. Aortic atherosclerosis. Electronically Signed   By: Davina Poke D.O.   On: 08/04/2019 14:56    Procedures Procedures (including critical care time)  Medications Ordered in ED Medications - No data to display  ED Course  I have reviewed the triage vital signs and the nursing notes.  Pertinent labs & imaging results that were available during my care of the patient were reviewed by me and considered in my medical decision making (see chart for details).    MDM Rules/Calculators/A&P                      BP 121/69   Pulse (!) 103   Temp (!) 101 F (38.3 C) (Rectal)   Resp 20   SpO2 97%   Final Clinical Impression(s) / ED Diagnoses Final diagnoses:  Lower urinary tract infectious disease    Rx / DC Orders ED Discharge Orders    None     Patient with history of urinary retention, bladder cancer, recurrent UTI, here with right hip pain and right lower abdominal pain.  He does have an  indwelling Foley catheter that is increased risk of developing urinary tract infection.  He has impaired renal function, will obtain renal stone study, but have low suspicion for kidney stone causing his symptoms.  Will evaluate for potential pyelonephritis.  Patient otherwise well-appearing.  3:25 PM Pt has a schedule cystoscopy with ureteroscopy/bilateral retrograde/bilateral stent placement/possible transurethral resection of prostate on May 24th with Dr. Karsten Ro  Pt does have a documented temperature of 101, is tachycardic at 103, mildly elevated WBC of 11.4.  Lactic acid is normal at 1.3.  Renal function is improved with Cr. 1.79, which is an improvement from Cr. 2.55 approximately 2 weeks ago.  I appreciate consultation with oncall urologist DR. Hedrick who felt pt can be discharge home with PO abx and close outpt f/u.  Pt tolerates PO and stable for discharge.  Will give a dose of rocephin here.    Domenic Moras, PA-C 08/04/19 1543    Davonna Belling, MD 08/07/19 [phone]

## 2019-08-04 NOTE — Discharge Instructions (Addendum)
Please call and follow up closely with your urologist for further management of your symptoms. Take tylenol as needed for fever.  Return if you develop vomiting and cannot keep your medication down or if your condition worsen.

## 2019-08-04 NOTE — ED Triage Notes (Signed)
Patient reports fever since last night with right flank pain and discomfort in lower abdomen. Foley in place. Hx CKD. Afebrile in triage. No tylenol today.

## 2019-08-04 NOTE — ED Notes (Signed)
200 ml urine emptied from leg bag of catheter. Pt arrived with cath in place,

## 2019-08-04 NOTE — ED Provider Notes (Signed)
  Care assumed from  Domenic Moras, PA-C at shift change with d/c s/p Rocephin.  In brief, this patient is a 86 year old male past medical history of bladder cancer status post resection, chronic indwelling Foley catheter, recurrent UTI complains of flank pain that began yesterday.  He has had some chills.  He is also had some nausea and urinary discomfort.  No vomiting, diarrhea.  Please see note from previous provider for full history/physical exam.  Physical Exam  BP (!) 153/132   Pulse 77   Temp (!) 101 F (38.3 C) (Rectal)   Resp 20   SpO2 99%   Physical Exam  ED Course/Procedures     Procedures  MDM   PLAN: Patient with mild fever here in the ED.  UA shows large leukocytes, pyuria.  Urine culture sent.  CBC shows slight leukocytosis of 11.4.  Previous PA discussed with urology, Dr. Louis Meckel who states that patient be discharged home with antibiotics and follow-up outpatient.   1. Lower urinary tract infectious disease      Portions of this note were generated with Dragon dictation software. Dictation errors may occur despite best attempts at proofreading.    Volanda Napoleon, PA-C 08/04/19 1638    Blanchie Dessert, MD 08/04/19 2028

## 2019-08-04 NOTE — ED Notes (Signed)
Pt verbalizes understanding of DC instructions. Pt belongings returned and is ambulatory out of ED.  

## 2019-08-06 ENCOUNTER — Other Ambulatory Visit: Payer: Self-pay | Admitting: Urology

## 2019-08-06 LAB — URINE CULTURE: Culture: >=100000 — AB

## 2019-08-06 MED ORDER — NITROFURANTOIN MACROCRYSTAL 50 MG PO CAPS: 50.0000 mg | ORAL_CAPSULE | Freq: Two times a day (BID) | ORAL | 0 refills | Status: AC

## 2019-08-06 NOTE — Progress Notes (Signed)
Wife called regarding patient symptoms overnight.  Experiencing low-grade fever, now defervesced.  Chills and sweats which have now subsided.  Was in the emergency department 3 days ago and urine culture returned with Enterococcus faecalis.  He is allergic to amoxicillin and penicillins which I confirmed over the phone.  He is allergic to quinolones Dragon confirmed over the phone.  His estimated GFR creatinine clearance is greater than 30.  We did discuss stopping the Keflex as this is likely not culture directed against his Enterococcus faecalis.  We will plan to start nitrofurantoin at 50 mg twice daily.  Discussed potential risks and benefits.  Discussed strict return precautions and if he is not improving to call our office back.  She is aware that at this point, would need to consider presentation for inpatient admission and intravenous antibiotics.

## 2019-08-08 ENCOUNTER — Telehealth: Payer: Self-pay | Admitting: Internal Medicine

## 2019-08-08 NOTE — Telephone Encounter (Signed)
Pt wife wanted to inform the Dr that the pt was in ER  on Friday for fever. And he had a   medication change by the urologist who took him off nitrofurantoin (MACRODANTIN) 50 MG capsule. He also had a UTI. The wife wants to give Apolonio Schneiders some updates on patient [phone].

## 2019-08-08 NOTE — Telephone Encounter (Signed)
FYI

## 2019-08-09 DIAGNOSIS — D631 Anemia in chronic kidney disease: Secondary | ICD-10-CM | POA: Diagnosis not present

## 2019-08-09 DIAGNOSIS — R413 Other amnesia: Secondary | ICD-10-CM | POA: Diagnosis not present

## 2019-08-09 DIAGNOSIS — R809 Proteinuria, unspecified: Secondary | ICD-10-CM | POA: Diagnosis not present

## 2019-08-09 DIAGNOSIS — N179 Acute kidney failure, unspecified: Secondary | ICD-10-CM | POA: Diagnosis not present

## 2019-08-09 DIAGNOSIS — N1832 Chronic kidney disease, stage 3b: Secondary | ICD-10-CM | POA: Diagnosis not present

## 2019-08-09 DIAGNOSIS — N400 Enlarged prostate without lower urinary tract symptoms: Secondary | ICD-10-CM | POA: Diagnosis not present

## 2019-08-09 DIAGNOSIS — E875 Hyperkalemia: Secondary | ICD-10-CM | POA: Diagnosis not present

## 2019-08-09 DIAGNOSIS — N139 Obstructive and reflux uropathy, unspecified: Secondary | ICD-10-CM | POA: Diagnosis not present

## 2019-08-11 ENCOUNTER — Encounter: Payer: Self-pay | Admitting: Internal Medicine

## 2019-08-14 ENCOUNTER — Encounter: Payer: Self-pay | Admitting: Podiatry

## 2019-08-14 ENCOUNTER — Ambulatory Visit (INDEPENDENT_AMBULATORY_CARE_PROVIDER_SITE_OTHER): Payer: Medicare Other | Admitting: Podiatry

## 2019-08-14 ENCOUNTER — Other Ambulatory Visit: Payer: Self-pay

## 2019-08-14 VITALS — Temp 96.9°F

## 2019-08-14 DIAGNOSIS — L84 Corns and callosities: Secondary | ICD-10-CM

## 2019-08-14 DIAGNOSIS — B351 Tinea unguium: Secondary | ICD-10-CM

## 2019-08-14 DIAGNOSIS — G629 Polyneuropathy, unspecified: Secondary | ICD-10-CM

## 2019-08-14 DIAGNOSIS — M79675 Pain in left toe(s): Secondary | ICD-10-CM

## 2019-08-14 DIAGNOSIS — M79674 Pain in right toe(s): Secondary | ICD-10-CM | POA: Diagnosis not present

## 2019-08-14 NOTE — Patient Instructions (Signed)

## 2019-08-17 DIAGNOSIS — D494 Neoplasm of unspecified behavior of bladder: Secondary | ICD-10-CM | POA: Diagnosis not present

## 2019-08-17 DIAGNOSIS — R338 Other retention of urine: Secondary | ICD-10-CM | POA: Diagnosis not present

## 2019-08-21 ENCOUNTER — Other Ambulatory Visit: Payer: Self-pay

## 2019-08-21 ENCOUNTER — Encounter (HOSPITAL_BASED_OUTPATIENT_CLINIC_OR_DEPARTMENT_OTHER): Payer: Self-pay | Admitting: Urology

## 2019-08-21 NOTE — Progress Notes (Signed)
Spoke w/ via phone for pre-op interview---wife barbara and patient Lab needs dos----    none          COVID test ------08-24-2019 @1000  am Arrive at -------530 am 08-28-2019 NPO after ------midnight Medications to take morning of surgery -----none Diabetic medication -----n/a Patient Special Instructions -----none Pre-Op special Istructions -----none Patient verbalized understanding of instructions that were given at this phone interview. Patient denies shortness of breath, chest pain, fever, cough a this phone interview.

## 2019-08-21 NOTE — Progress Notes (Signed)
Subjective: Benjamin Cardenas is a 86 y.o. male patient seen today at risk foot care with history of peripheral neuropathy and painful porokeratotic lesion(s) b/l feet and painful mycotic toenails b/l that limit ambulation. Aggravating factors include weightbearing with and without shoe gear. Pain for both is relieved with periodic professional debridement. He voices no new pedal problems on today's visit.  Patient Active Problem List   Diagnosis Date Noted  . Hyperkalemia 07/04/2019  . Vitamin D deficiency 01/27/2019  . BPH with urinary obstruction 11/07/2018  . Mild renal insufficiency 09/21/2018  . Sepsis due to urinary tract infection ([place]) 08/14/2018  . Sepsis secondary to UTI ([place]) 08/13/2018  . AKI (acute kidney injury) ([place]) 08/13/2018  . Acute encephalopathy 08/13/2018  . Bladder outlet obstruction 08/13/2018  . AAA (abdominal aortic aneurysm) without rupture ([place]) 08/13/2018  . MRSA (methicillin resistant Staphylococcus aureus) carrier 05/30/2018  . Bladder cancer ([place]) 03/10/2018  . CAP (community acquired pneumonia) 06/26/2016  . Hyponatremia 12/13/2015  . Anemia 12/13/2015  . Altered mental status 12/13/2015  . Constipation 08/17/2015  . Memory impairment 12/04/2014  . Keratosis pilaris 02/25/2011  . 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 Inguinal hernia unilateral, non-recurrent, left 01/28/2011  . OTHER SYMPTOMS INVOLVING CARDIOVASCULAR SYSTEM 09/20/2009  . Osteoarthritis 01/31/2009  . SCIATICA, ACUTE 12/20/2007  . NECK PAIN 02/15/2007  . BPH (benign prostatic hyperplasia) 01/06/2007  . History of colonic polyps 01/06/2007    Current Outpatient Medications on File Prior to Visit  Medication Sig Dispense Refill  . aspirin EC 81 MG tablet Take 81 mg by mouth daily.     . cetirizine (ZYRTEC) 10 MG tablet Take 10 mg by mouth at bedtime.     [name] DETROL LA 4 MG 24 hr capsule Take 4 mg by mouth daily.    . naphazoline-pheniramine (NAPHCON-A) 0.025-0.3 % ophthalmic solution  Place 1 drop into both eyes 4 (four) times daily as needed for eye irritation.     . TRULANCE 3 MG TABS Take 3 mg by mouth every morning.     . vitamin B-12 1000 MCG tablet Take 1 tablet (1,000 mcg total) by mouth daily. 30 tablet 0   No current facility-administered medications on file prior to visit.    Allergies  Allergen Reactions  . Amoxicillin Rash    Has patient had a PCN reaction causing immediate rash, facial/tongue/throat swelling, SOB or lightheadedness with hypotension:No Has patient had a PCN reaction causing severe rash involving mucus membranes or skin necrosis:unsure Has patient had a PCN reaction that required hospitalization:No Has patient had a PCN reaction occurring within the last 10 years:unsure If all of the above answers are "NO", then may proceed with Cephalosporin use.   . Ciprofloxacin Swelling and Rash  . Sulfasalazine Swelling  . Sulfa Antibiotics Swelling  . Doxycycline Hyclate Rash  . Levofloxacin Rash    Objective: Physical Exam  General: Benjamin Cardenas is a pleasant 86 y.o. y.o. Caucasian male, in NAD. AAO x 3.  Vascular:  Neurovascular status unchanged b/l. Capillary refill time to digits immediate b/l. Palpable DP pulses b/l. Palpable PT pulses b/l. Pedal hair absent b/l Skin temperature gradient within normal limits b/l. No edema noted b/l.  Dermatological:  Pedal skin with normal turgor, texture and tone bilaterally. No open wounds bilaterally. No interdigital macerations bilaterally. Toenails 1-5 b/l elongated, dystrophic, thickened, crumbly with subungual debris and tenderness to dorsal palpation. Hyperkeratotic lesion(s) L hallux and R hallux.  No erythema, no edema, no drainage, no flocculence. Porokeratotic lesion(s) submet head  1 left foot, submet head 1 right foot, submet head 5 left foot and submet head 5 right foot with visible subdermal hemorrhage and tenderness to palpation. No erythema, no edema, no drainage, no  flocculence.  Musculoskeletal:  Normal muscle strength 5/5 to all lower extremity muscle groups bilaterally. No pain crepitus or joint limitation noted with ROM b/l. Plantarflexed metatarsals submet heads 1, 5 b/l.  Neurological:  Protective sensation intact 5/5 intact bilaterally with 10g monofilament b/l. Vibratory sensation intact b/l. Proprioception intact bilaterally.  Assessment and Plan:  1. Pain due to onychomycosis of toenails of both feet   2. Pre-ulcerative calluses   3. Neuropathy    -Examined patient. -No new findings. No new orders. -Toenails 1-5 b/l were debrided in length and girth with sterile nail nippers and dremel without iatrogenic bleeding.  -Callus(es) L hallux and R hallux pared utilizing sterile scalpel blade without complication or incident. Total number debrided =2. -Painful porokeratotic lesion(s) submet head 1 left foot, submet head 1 right foot, submet head 5 left foot and submet head 5 right foot pared and enucleated with sterile scalpel blade without incident. -Patient to continue soft, supportive shoe gear daily. -Patient to report any pedal injuries to medical professional immediately. -Patient/POA to call should there be question/concern in the interim.  Return in about 9 weeks (around 10/16/2019).  Marzetta Board, DPM

## 2019-08-24 ENCOUNTER — Other Ambulatory Visit (HOSPITAL_COMMUNITY)
Admission: RE | Admit: 2019-08-24 | Discharge: 2019-08-24 | Disposition: A | Discharge status: Home / Self Care | Payer: Medicare Other | Source: Ambulatory Visit | Attending: Urology | Admitting: Urology

## 2019-08-24 DIAGNOSIS — Z20822 Contact with and (suspected) exposure to covid-19: Secondary | ICD-10-CM | POA: Diagnosis not present

## 2019-08-24 DIAGNOSIS — Z01812 Encounter for preprocedural laboratory examination: Secondary | ICD-10-CM | POA: Insufficient documentation

## 2019-08-24 LAB — SARS CORONAVIRUS 2 (TAT 6-24 HRS): SARS Coronavirus 2: NEGATIVE

## 2019-08-24 NOTE — H&P (Signed)
HPI: Benjamin Cardenas is a 86 year-old male established patient who is here for follow-up of bladder cancer.  His bladder cancer was superficial and limitied to the bladder lining.   He did have a TURBT. His last bladder tumor was resected 02/16/2015. He has had the following number of bladder resections: 1. He had treatment with the following intravesical agents: BCG and Mitomycin. Patient denies Interferon, Adriamycin, Epirubicin, and Gemcitabine.   He has not had blood in his urine recently. The patient has developed frequency and urgency. He is not having new bone pain. He has not recently had unwanted weight loss.   His last cysto was 02/22/2018.   This condition would be considered of mild to moderate severity with no modifying factors or associated signs or symptoms other than as noted above.   11/21/18: At the time of his transurethral resection of his prostate on 11/07/18 I noted diminished bladder capacity and inflamed bladder mucosa that was biopsied and revealed significant inflammation with granulomatous changes. There was no evidence of transitional cell carcinoma.     CC: I have hydronephrosis.  HPI: His problem was discovered 08/13/2018. [name] He had the following imaging studies done: Renal Ultrasound, CT Scan, and MRI Scan.   He has not had kidney surgery. He has not had a stent placed in his kidney. He has not received radiation therapy.   09/20/18. On 08/13/18 a CT scan revealed severe bilateral hydronephrosis with some degree of bladder distention at the time. He since had a Foley catheter indwelling and the on 09/20/18 he underwent an MRI scan of the spine which revealed persistent right hydronephrosis with some left hydronephrosis. The ureters are dilated down the level of the bladder on his CT scan.   09/29/18: At his last visit I checked his creatinine and despite the fact that he had right hydronephrosis with some mild left hydronephrosis his creatinine was found to be normal at  0.9. A renal ultrasound on 09/22/18 revealed mild bilateral hydronephrosis that appeared, when compared to his previous CT scan and MRI scans, somewhat less and his creatinine remains stable at 1.03.  At the time of a TURP for urinary retention on 11/07/18 he was noted to have an inflamed bladder that was of very small capacity. Bladder biopsies were negative.   07/26/19: The patient is seen today for further evaluation of persistent hydronephrosis. On 07/13/19 he was found to have severe bilateral hydronephrosis by ultrasound that, when compared to his ultrasound in 5/20 (at which time his creatinine was 1.1), appeared stable. On 07/14/19 he was found to have a creatinine of 2.55 with some BUN of 59 suggesting the presence of dehydration as a possible partial contributing factor. His urinalysis had budding yeast with 21-50 WBCs, no RBCs and a urine culture that proved negative.  Although his ultrasound did not reveal a distended bladder he developed worsening voiding symptoms and was seen in the ER on 07/14/19. A bladder scan found 500 cc in his bladder and a catheter was inserted with 500 cc of clear urine having returned.  He was seen with his wife today. He indicates that he is tolerating the catheter. He has no flank pain.     ALLERGIES: Amoxicillin TABS Cipro TABS Doxy-Caps CAPS Levaquin TABS Sulfa Drugs Zithromax CAPS    MEDICATIONS: Detrol La 4 mg capsule, ext release 24 hr 1 capsule PO Daily  Zyrtec  Aspirin 81 MG TABS Oral  Benefiber 1 gram tablet Oral  D-Mannose 99 % powder  Eye  Drops  Prelief  Tramadol Hcl  Trulance     GU PSH: Bladder Instill AntiCA Agent - 2017, 2017, 2017, 2016 Catheterization For Collection Of Specimen, Single Patient, All Places Of Service - 10/31/2018 Cysto Bladder Ureth Biopsy - 11/07/2018, 2017 Cystoscopy - 09/29/2018, 02/22/2018, 08/17/2017, 03/03/2017, 2018, 2018, 2018, 2017, 2017 Cystoscopy TURBT 2-5 cm - 2016 Cystoscopy TURP - 11/07/2018 Cystoscopy  Ureteroscopy - 2016       PSH Notes: Cystoscopy With Biopsy, Cystoscopy With Ureteroscopy Left, Bladder Injection Of Cancer Treatment, Cystoscopy With Fulguration Medium Lesion (2-5cm), Hernia Repair   NON-GU PSH: Hernia Repair - 2013     GU PMH: Nocturia (Worsening), He is primarily bothered by his nocturia but has daytime frequency and urgency as well. - 01/19/2019 Urge incontinence, He has what sounds like urge incontinence and I therefore am going to place him on Myrbetriq 25 mg samples. - 01/19/2019 History of bladder cancer (Stable), With no evidence of recurrent transitional cell carcinoma it has really been 4 years since his last bladder tumor and therefore we discussed proceeding with further surveillance cystoscopy on a yearly basis here out. - 11/21/2018, (Worsening), With his history of bladder cancer and now a lesion within the bladder I told him that it could be related to his catheter however I and reluctant to blame his catheter for an abnormality in the bladder and therefore have recommended we proceed with a biopsy., - 09/29/2018 (Stable), I am going to hold off on cystoscopy until I can see if I can render him catheter free. I may have to proceed with cystoscopy when he returns if he does not begin voiding spontaneously., - 08/23/2018, I again found no evidence of recurrent transitional cell carcinoma the bladder. Urine will be sent for cytology and I will tentatively plan to see him back again in 6 months for surveillance cystoscopy., - 02/22/2018 (Stable), He again had no evidence of recurrent transitional cell carcinoma the bladder. I will continue surveillance., - 08/17/2017 (Stable), He had no evidence of recurrent papillary transitional cell carcinoma of the bladder. It has now been 2 years since I made his diagnosis and he has not had recurrence. He therefore can proceed with surveillance cystoscopy every 6 months for the next 2 years., - 03/03/2017 (Stable), I noted no evidence of  recurrent transitional cell carcinoma of the bladder. The area on the floor the bladder appears unchanged. I will send urine for cytology., - 2018 (Stable), I again note that the area on the posterior wall bladder where his tumor was previously resected appear slightly erythematous and inflamed and we discussed the difficulty determining the difference between cancer and inflammation so I sent a urine cytology at his last visit and that was negative. I will repeat a urine cytology today and tentatively plan to see him back again in 3 months for repeat surveillance cystoscopy., - 2018 (Stable), He had no evidence of papillary lesions within the bladder but there was still an area of erythema near where his previous bladder tumor was resected and I can't tell if this is just inflammation which it appears to be. Rather than repeat another biopsy at this point I'm going to send his urine for cytology and make a decision based on those results. I will otherwise tentatively plan to see him back in 3 months for repeat surveillance cystoscopy., - 2018 (Stable), I found no evidence of recurrent transitional cell carcinoma of the bladder today. He did have an area of inflammation on the left wall. I  think this is most likely what is causing him of his voiding symptoms. I'm not going to treat him with any further intravesical BCG at this time., - 2017 (Stable), He is due for repeat surveillance cystoscopy blut I do not want to proceed with cystoscopy in the presence of possible infection so I will bring him back once his culture has shown no evidence of infection or any infection present has been treated., - 2017 (Stable), He has undergone a 6 week induction course of and he had no side effects. He is now having symptoms after his first treatment with a negative culture so I have recommended using half-strength BCG for his remaining 2 treatments. In addition he is going to stop taking the antibiotics that I have prescribed 24  hours prior to his next intravesical BCG treatment in order to not diminish its effectiveness., - 2017 (Stable), He had no evidence of recurrent transitional cell carcinoma of the bladder today. He will receive BCG weekly for 3 weeks for maintenance and return in 3 months for repeat cystoscopy and maintenance BCG., - 2017 Incomplete bladder emptying (Stable), He does still carry a slight elevation of his PVR but think that will improve over time. - 11/21/2018 Oth hydronephrosis (Improving), Bilateral, His hydronephrosis is improving based on his most recent renal ultrasound and he has a normal creatinine. I will continue to monitor this. - 09/29/2018 Urinary Retention (Stable), He we are going to manage his urinary retention right now with a Foley catheter. I will then plan to proceed with transurethral resection of the prostate. He will return to the office 7-10 days prior to his surgery for a catheterized specimen for culture in preparation for surgery. - 09/29/2018, (Stable), He was having too many bladder spasms to fill the bladder enough to perform an adequate voiding trial so his catheter was removed and he is going to force fluids today and return if he is unable to urinate., - 09/20/2018 (Stable), He will return in 1 month for a repeat voiding trial., - 08/23/2018 Hydronephrosis, Bilateral, He has persistent right hydronephrosis on his MRI scan. He has been having pain but I do not think that this hydronephrosis has anything to do with his pain. I do need to check a creatinine. - 09/20/2018 BPH w/LUTS (Stable), He had marked BPH by exam. I did not notice any worrisome nodularity or induration and there is no fluctuance or. - 05/02/2018, Incomplete emptying of bladder due to benign prostatic hyperplasia, - 2016, Benign prostatic hyperplasia with urinary obstruction, - 2016 Low back pain, His low back pain seems to be musculoskeletal. It may be in some way related to the exercising that he has been doing  recently. I have reassured him with respect to this. - 05/02/2018 Urinary Urgency, He reported to me today that he has been having some urgency and some frequency and has had a couple of episodes of nocturnal enuresis. I told him I need to rule out urinary tract infection the. Effects negative we could consider a trial of Myrbetriq. - 02/22/2018 Chronic cystitis (w/o hematuria) (Stable), He was told he had a UTI in the ER but no culture was done. I told him I would culture his urine today because there was some pyuria. I suspect this may be due to his previous bladder surgeries and the presence of some dystrophic calcification. - 2017 Urinary Frequency (Worsening) - 2017 Dysuria (Worsening), We discussed the possible etiologies for his voiding symptoms. I first discussed the fact that BCG results  in an immune response and that no immune response often portends a decreased effectiveness of the medication so this is not particularly a bad thing. His urine culture was negative and he saw some improvement with alfuzosin. We also discussed BCG prostatitis as another possible cause and the fact that longer courses of antibiotics are typically necessary for prostatitis. Either way it sounds like there is also an inflammatory component so I have recommended nonsteroidal anti-inflammatory medication as opposed to a steroid. - 2017, - 2017 Bladder, Neoplasm of Unspecified behavior, Bladder neoplasm - 2017 Elevated PSA, Elevated prostate specific antigen (PSA) - 2016 Hydronephrosis Unspec, Hydronephrosis, left - 2016 Hematospermia, Hematospermia - 2015 Inflammatory Disease Prostate, Unspec, Prostatitis - 2015 Neoplasm unspecified behavior of other genitourinary organ, Testicular neoplasm - 2014 Prostate nodule w/o LUTS, Nodular prostate without lower urinary tract symptoms - 2014 Prostate Stones, Calculus of prostate - 2014      PMH Notes: Hematospermia: He has a history of having had hemospermia in the past but  that was felt likely secondary to some prostate calcification that I had noted on transrectal ultrasound.    BPH with bladder outlet obstruction: He does have some slowing of his urinary stream, but only gets up once at night, has mild intermittency, no dysuria or hematuria.  TURP 11/07/18: 5 g BPH   Elevated PSA and prostate nodule: He has a known prostate nodule that he has had for some time. It is subtle and has remained unchanged over the years. It is in the right apex and essentially has the same consistency as the rest of the prostate. His PSA remains just above the normal range and fluctuates slightly to within the normal range but essentially has remained stable over the years.    Transitional cell carcinoma of the bladder: He experienced gross hematuria that was not associated with flank pain or voiding symptoms. He takes a daily aspirin and has smoked in the past.  CT scan 02/05/15 - Left hydronephrosis down to the bladder.  TURBT 02/25/15 + Mitomycin-C  Pathology: High-grade, transitional cell carcinoma with invasion through the muscularis mucosa but not into the muscularis propria.  Stage: T1,G3  Repeat bladder biopsy 04/22/15: No further obvious lesions were found within the bladder. I obtained biopsies from the base of the tumor and also at the edge to make sure it had been completely resected.  Pathology: Inflammation and benign urothelium with muscularis present and negative.  Treatment: 6 week induction course of BCG completed 3/17  Bladder biopsy 11/07/18: Benign     NON-GU PMH: Pyuria/other UA findings, I noted some pyuria minimal bacteriuria. Because of urethral instrumentation his urine will be cultured. - 11/21/2018, (Stable), He has a history of chronic pyuria with the appearance of an infection however cultures have been negative in the past. I will culture his urine today., - 02/22/2018, He had pyuria today and has had some hematuria but had no evidence of recurrent bladder  cancer so I will culture his urine., - 08/17/2017 (Stable), I noted pyuria today but no bacteriuria and his urine was nitrite negative. His urine will be sent for cytology but I do not feel culture is warranted., - 03/03/2017, He did have some pyuria and bacteriuria and because he had reported experiencing some mild dysuria I will culture his urine., - 2018, He did have some pyuria and bacteriuria so we will culture his urine as well today to rule out infection., - 2018, He did have some white cells and bacteria in his urine  today. Because of his irritative symptoms I am going to send his urine for culture., - 2018 (Stable), He did have some mild pyuria. He did not have anything to suggest infection and has no symptoms. I think this is due to the inflammation seen in his bladder., - 2017 Encounter for other specified prophylactic measures, He will receive full strength BCG today in weekly x3 weeks. - 2017 Encounter for general adult medical examination without abnormal findings, Encounter for preventive health examination - 2015    FAMILY HISTORY: Colon Cancer - [place] Status Number - Runs In Family Heart Disease - Father, Brother, Sister   SOCIAL HISTORY: Marital Status: Married Preferred Language: English; Ethnicity: Not Hispanic Or Latino; Race: White Current Smoking Status: Patient does not smoke anymore. Smoked for 20 years.  Drinks 1 drink per week. Types of alcohol consumed: Beer. Social Drinker.  Drinks 2 caffeinated drinks per day.     Notes: Former smoker, Occupation:, Alcohol Use, Caffeine Use, Tobacco Use, Marital History - Currently Married   REVIEW OF SYSTEMS:    GU Review Male:   Patient denies frequent urination, hard to postpone urination, burning/ pain with urination, get up at night to urinate, leakage of urine, stream starts and stops, trouble starting your stream, have to strain to urinate , erection problems, and penile pain.  Gastrointestinal (Upper):   Patient  denies nausea, vomiting, and indigestion/ heartburn.  Gastrointestinal (Lower):   Patient denies diarrhea and constipation.  Constitutional:   Patient denies fever, night sweats, weight loss, and fatigue.  Skin:   Patient denies skin rash/ lesion and itching.  Eyes:   Patient denies blurred vision and double vision.  Ears/ Nose/ Throat:   Patient denies sinus problems and sore throat.  Hematologic/Lymphatic:   Patient denies swollen glands and easy bruising.  Cardiovascular:   Patient denies leg swelling and chest pains.  Respiratory:   Patient denies cough and shortness of breath.  Endocrine:   Patient denies excessive thirst.  Musculoskeletal:   Patient denies back pain and joint pain.  Neurological:   Patient denies headaches and dizziness.  Psychologic:   Patient denies depression and anxiety.   VITAL SIGNS:    Weight 134 lb / 60.78 kg  Height 66 in / 167.64 cm  BP 102/61 mmHg  Pulse 73 /min  Temperature 97.3 F / 36.2 C  BMI 21.6 kg/m   GU PHYSICAL EXAMINATION:    Anus and Perineum: No hemorrhoids. No anal stenosis. No rectal fissure, no anal fissure. No edema, no dimple, no perineal tenderness, no anal tenderness.  Scrotum: No lesions. No edema. No cysts. No warts.  Epididymides: Right: no spermatocele, no masses, no cysts, no tenderness, no induration, no enlargement. Left: no spermatocele, no masses, no cysts, no tenderness, no induration, no enlargement.  Testes: No tenderness, no swelling, no enlargement left testes. No tenderness, no swelling, no enlargement right testes. Normal location left testes. Normal location right testes. No mass, no cyst, no varicocele, no hydrocele left testes. No mass, no cyst, no varicocele, no hydrocele right testes.  Urethral Meatus: Normal size. No lesion, no wart, no discharge, no polyp. Normal location.  Penis: Circumcised, no warts, no cracks. No dorsal Peyronie's plaques, no left corporal Peyronie's plaques, no right corporal Peyronie's  plaques, no scarring, no warts. No balanitis, no meatal stenosis.  Prostate: Prostate 4 + size. Left lobe normal consistency, right lobe normal consistency. Symmetrical lobes. No prostate nodule. Left lobe no tenderness, right lobe no tenderness.  Seminal Vesicles: Nonpalpable.  Sphincter Tone: Normal sphincter. No rectal tenderness. No rectal mass.      MULTI-SYSTEM PHYSICAL EXAMINATION:    Constitutional: Well-nourished, thin. No physical deformities. Normally developed. Good grooming. Frail. Neck: Neck symmetrical, not swollen. Normal tracheal position.  Respiratory: No labored breathing, no use of accessory muscles.   Cardiovascular: Normal temperature, normal extremity pulses, no swelling, no varicosities.  Lymphatic: No enlargement of neck, axillae, groin.  Skin: No paleness, no jaundice, no cyanosis. No lesion, no ulcer, no rash.  Neurologic / Psychiatric: Oriented to time, oriented to place, oriented to person. No depression, no anxiety, no agitation.  Gastrointestinal: No mass, no tenderness, no rigidity, non obese abdomen.  Eyes: Normal conjunctivae. Normal eyelids.  Ears, Nose, Mouth, and Throat: Left ear no scars, no lesions, no masses. Right ear no scars, no lesions, no masses. Nose no scars, no lesions, no masses. Normal hearing. Normal lips.  Musculoskeletal: Normal gait and station of head and neck.   Complexity of Data:  Lab Test Review:   BUN/Creatinine  Records Review:   Previous Hospital Records, Previous Patient Records, POC Tool  Urine Test Review:   Urinalysis, Urine Culture  X-Ray Review: Renal Ultrasound: Reviewed Films. Reviewed Report. Discussed With Patient.     01/30/15 10/03/13 10/27/11 10/23/10 11/22/08 02/21/08 11/10/07 02/17/07  PSA  Total PSA 3.93  4.43  3.08  3.54  4.05  3.36  4.14  3.08   Free PSA  1.12    0.74   0.94    % Free PSA  25    18.3   22.7      07/25/19 09/20/18 01/29/15  General Chemistry  Creatinine 1.6 mg/dL 0.9 mg/dL 0.74     PROCEDURES:         Renal Ultrasound - 09811  Right kidney  Length: 8.0 cm Depth:3.7 cm Cortical Width: .9 cm Width: 3.9 cm  Left Kidney Length: 9.9 cm Depth:6.1 cm Cortical Width: 1.2 cm Width: 7.1 cm  Left Kidney/Ureter:  There is mod/severe hydro noted w a prob lower pole calc 3.9 mm- the prox ureter appears to be dilated as well  Right Kidney/Ureter:  Decreased size of right kidney as well as mod hydro noted- no stones could be visualized by [place] on todays study  Bladder:  There is a foley in place in bladder w a PVR of 44.0 ml      . Patient confirmed No Neulasta OnPro Device. Independent review of his renal ultrasound today reveals there was bilateral hydronephrosis more significant on the left-hand side than the right. Foley catheter was noted in the bladder.    ASSESSMENT/PLAN:      ICD-10 Details  1 GU:   Oth hydronephrosis - N13.39 Bilateral, Chronic, Stable - Has bilateral hydronephrosis Lt.>Rt. And based on what I saw the time of his transurethral resection of the prostate I suspect it is due to bladder wall thickening because he had a small, inflamed bladder but I need to rule out other causes of obstruction and therefore have recommended we evaluate him further with cystoscopy, bilateral retrograde pyelograms and stent placement. We did discuss the fact that it may be difficult to identify 1 or both of his ureteral orifice and therefore may require percutaneous access for antegrade stent placement.  2   Chronic kidney disease stage 4 (GFR 15-30) - N18.4 Chronic, Worsening - His renal function has improved and is identical to where was 6 months ago but still higher than it was 10 months ago.  Although there was some improvement with Foley catheterization I do not think his hydronephrosis is due to outlet obstruction.  3   Urinary Retention - R33.8 Chronic, Stable - He seemed to have developed urinary retention and this is despite the fact that he has undergone a TURP so I told him  I would assess his outlet at the time of his surgery and if I did note any evidence of obstructing prostate tissue I would resect the tissue.  4   History of bladder cancer - Z85.51 Chronic, Stable - He does have a history of bladder can. I do not think that that has anything to do with his ureteral obstruction although there is a remote possibility of this.  2  Urine culture on 5/15 was positive for yeast only.  He was started on fluconazole at that time had has remain on that until the time of his surgery.

## 2019-08-28 ENCOUNTER — Ambulatory Visit (HOSPITAL_BASED_OUTPATIENT_CLINIC_OR_DEPARTMENT_OTHER): Payer: Medicare Other | Admitting: Anesthesiology

## 2019-08-28 ENCOUNTER — Encounter (HOSPITAL_BASED_OUTPATIENT_CLINIC_OR_DEPARTMENT_OTHER): Payer: Self-pay | Admitting: Urology

## 2019-08-28 ENCOUNTER — Other Ambulatory Visit: Payer: Self-pay

## 2019-08-28 ENCOUNTER — Encounter (HOSPITAL_BASED_OUTPATIENT_CLINIC_OR_DEPARTMENT_OTHER)
Admission: RE | Disposition: A | Discharge status: Home / Self Care | Payer: Self-pay | Source: Home / Self Care | Attending: Urology

## 2019-08-28 ENCOUNTER — Ambulatory Visit (HOSPITAL_BASED_OUTPATIENT_CLINIC_OR_DEPARTMENT_OTHER)
Admission: RE | Admit: 2019-08-28 | Discharge: 2019-08-28 | Disposition: A | Discharge status: Home / Self Care | Payer: Medicare Other | Attending: Urology | Admitting: Urology

## 2019-08-28 DIAGNOSIS — R3915 Urgency of urination: Secondary | ICD-10-CM | POA: Insufficient documentation

## 2019-08-28 DIAGNOSIS — C679 Malignant neoplasm of bladder, unspecified: Secondary | ICD-10-CM | POA: Insufficient documentation

## 2019-08-28 DIAGNOSIS — R350 Frequency of micturition: Secondary | ICD-10-CM | POA: Diagnosis not present

## 2019-08-28 DIAGNOSIS — R338 Other retention of urine: Secondary | ICD-10-CM | POA: Diagnosis not present

## 2019-08-28 DIAGNOSIS — N1330 Unspecified hydronephrosis: Secondary | ICD-10-CM | POA: Diagnosis not present

## 2019-08-28 DIAGNOSIS — N184 Chronic kidney disease, stage 4 (severe): Secondary | ICD-10-CM | POA: Diagnosis not present

## 2019-08-28 DIAGNOSIS — Z880 Allergy status to penicillin: Secondary | ICD-10-CM | POA: Insufficient documentation

## 2019-08-28 DIAGNOSIS — Z87891 Personal history of nicotine dependence: Secondary | ICD-10-CM | POA: Insufficient documentation

## 2019-08-28 DIAGNOSIS — Z800 Family history of malignant neoplasm of digestive organs: Secondary | ICD-10-CM | POA: Diagnosis not present

## 2019-08-28 DIAGNOSIS — Z882 Allergy status to sulfonamides status: Secondary | ICD-10-CM | POA: Diagnosis not present

## 2019-08-28 DIAGNOSIS — Z7982 Long term (current) use of aspirin: Secondary | ICD-10-CM | POA: Diagnosis not present

## 2019-08-28 DIAGNOSIS — Z888 Allergy status to other drugs, medicaments and biological substances status: Secondary | ICD-10-CM | POA: Insufficient documentation

## 2019-08-28 DIAGNOSIS — Z79899 Other long term (current) drug therapy: Secondary | ICD-10-CM | POA: Diagnosis not present

## 2019-08-28 DIAGNOSIS — N320 Bladder-neck obstruction: Secondary | ICD-10-CM | POA: Diagnosis not present

## 2019-08-28 DIAGNOSIS — Z881 Allergy status to other antibiotic agents status: Secondary | ICD-10-CM | POA: Insufficient documentation

## 2019-08-28 DIAGNOSIS — N401 Enlarged prostate with lower urinary tract symptoms: Secondary | ICD-10-CM | POA: Diagnosis not present

## 2019-08-28 DIAGNOSIS — Z8249 Family history of ischemic heart disease and other diseases of the circulatory system: Secondary | ICD-10-CM | POA: Insufficient documentation

## 2019-08-28 HISTORY — PX: CYSTOSCOPY WITH URETEROSCOPY AND STENT PLACEMENT: SHX6377

## 2019-08-28 HISTORY — DX: Chronic kidney disease, unspecified: N18.9

## 2019-08-28 LAB — POCT I-STAT, CHEM 8
BUN: 27 mg/dL — ABNORMAL HIGH (ref 8–23)
Calcium, Ion: 1.3 mmol/L (ref 1.15–1.40)
Chloride: 105 mmol/L (ref 98–111)
Creatinine, Ser: 1.7 mg/dL — ABNORMAL HIGH (ref 0.61–1.24)
Glucose, Bld: 103 mg/dL — ABNORMAL HIGH (ref 70–99)
HCT: 32 % — ABNORMAL LOW (ref 39.0–52.0)
Hemoglobin: 10.9 g/dL — ABNORMAL LOW (ref 13.0–17.0)
Potassium: 4.3 mmol/L (ref 3.5–5.1)
Sodium: 140 mmol/L (ref 135–145)
TCO2: 29 mmol/L (ref 22–32)

## 2019-08-28 SURGERY — CYSTOURETEROSCOPY, WITH STENT INSERTION
Anesthesia: General | Site: Bladder

## 2019-08-28 MED ORDER — LACTATED RINGERS IV SOLN: INTRAVENOUS | Status: DC

## 2019-08-28 MED ORDER — GENTAMICIN SULFATE 40 MG/ML IJ SOLN: 5.0000 mg/kg | Freq: Once | INTRAVENOUS | Status: DC

## 2019-08-28 MED ORDER — OXYCODONE HCL 5 MG PO TABS
5.0000 mg | ORAL_TABLET | Freq: Once | ORAL | Status: AC | PRN
  Administered 2019-08-28: 5 mg via ORAL

## 2019-08-28 MED ORDER — ONDANSETRON HCL 4 MG/2ML IJ SOLN
INTRAMUSCULAR | Status: AC
  Filled 2019-08-28: qty 2

## 2019-08-28 MED ORDER — GENTAMICIN SULFATE 40 MG/ML IJ SOLN
1.5000 mg/kg | INTRAVENOUS | Status: AC
  Administered 2019-08-28: 90 mg via INTRAVENOUS
  Filled 2019-08-28: qty 2.25

## 2019-08-28 MED ORDER — LIDOCAINE 2% (20 MG/ML) 5 ML SYRINGE
INTRAMUSCULAR | Status: AC
  Filled 2019-08-28: qty 5

## 2019-08-28 MED ORDER — VANCOMYCIN HCL IN DEXTROSE 1-5 GM/200ML-% IV SOLN: 1000.0000 mg | INTRAVENOUS | Status: DC

## 2019-08-28 MED ORDER — VANCOMYCIN HCL IN DEXTROSE 1-5 GM/200ML-% IV SOLN
INTRAVENOUS | Status: AC
  Filled 2019-08-28: qty 200

## 2019-08-28 MED ORDER — VANCOMYCIN HCL 1000 MG IV SOLR: INTRAVENOUS | Status: DC | PRN

## 2019-08-28 MED ORDER — PHENYLEPHRINE 40 MCG/ML (10ML) SYRINGE FOR IV PUSH (FOR BLOOD PRESSURE SUPPORT)
PREFILLED_SYRINGE | INTRAVENOUS | Status: DC | PRN
  Administered 2019-08-28 (×2): 120 ug via INTRAVENOUS
  Administered 2019-08-28: 80 ug via INTRAVENOUS

## 2019-08-28 MED ORDER — OXYCODONE HCL 5 MG/5ML PO SOLN: 5.0000 mg | Freq: Once | ORAL | Status: AC | PRN

## 2019-08-28 MED ORDER — SODIUM CHLORIDE 0.9 % IR SOLN
Status: DC | PRN
  Administered 2019-08-28: 3000 mL via INTRAVESICAL

## 2019-08-28 MED ORDER — PHENYLEPHRINE 40 MCG/ML (10ML) SYRINGE FOR IV PUSH (FOR BLOOD PRESSURE SUPPORT)
PREFILLED_SYRINGE | INTRAVENOUS | Status: AC
  Filled 2019-08-28: qty 10

## 2019-08-28 MED ORDER — ONDANSETRON HCL 4 MG/2ML IJ SOLN
INTRAMUSCULAR | Status: DC | PRN
  Administered 2019-08-28: 4 mg via INTRAVENOUS

## 2019-08-28 MED ORDER — PROPOFOL 10 MG/ML IV BOLUS
INTRAVENOUS | Status: DC | PRN
  Administered 2019-08-28: 20 mg via INTRAVENOUS
  Administered 2019-08-28: 100 mg via INTRAVENOUS

## 2019-08-28 MED ORDER — PROPOFOL 10 MG/ML IV BOLUS
INTRAVENOUS | Status: AC
  Filled 2019-08-28: qty 20

## 2019-08-28 MED ORDER — LIDOCAINE 2% (20 MG/ML) 5 ML SYRINGE
INTRAMUSCULAR | Status: DC | PRN
  Administered 2019-08-28: 60 mg via INTRAVENOUS

## 2019-08-28 MED ORDER — VANCOMYCIN HCL 1000 MG IV SOLR
1000.0000 mg | INTRAVENOUS | Status: DC
  Administered 2019-08-28: 1000 mg via INTRAVENOUS
  Filled 2019-08-28: qty 1000

## 2019-08-28 MED ORDER — FENTANYL CITRATE (PF) 100 MCG/2ML IJ SOLN
INTRAMUSCULAR | Status: AC
  Filled 2019-08-28: qty 2

## 2019-08-28 MED ORDER — ONDANSETRON HCL 4 MG/2ML IJ SOLN: 4.0000 mg | Freq: Once | INTRAMUSCULAR | Status: DC | PRN

## 2019-08-28 MED ORDER — FENTANYL CITRATE (PF) 100 MCG/2ML IJ SOLN: 25.0000 ug | INTRAMUSCULAR | Status: DC | PRN

## 2019-08-28 MED ORDER — OXYCODONE HCL 5 MG PO TABS
ORAL_TABLET | ORAL | Status: AC
  Filled 2019-08-28: qty 1

## 2019-08-28 MED ORDER — IOHEXOL 300 MG/ML  SOLN
INTRAMUSCULAR | Status: DC | PRN
  Administered 2019-08-28: .1 mL

## 2019-08-28 MED ORDER — FENTANYL CITRATE (PF) 100 MCG/2ML IJ SOLN
INTRAMUSCULAR | Status: DC | PRN
  Administered 2019-08-28: 25 ug via INTRAVENOUS
  Administered 2019-08-28: 50 ug via INTRAVENOUS

## 2019-08-28 SURGICAL SUPPLY — 23 items
BAG DRAIN URO-CYSTO SKYTR STRL (DRAIN) ×2 IMPLANT
BAG DRN UROCATH (DRAIN) ×1
BASKET ZERO TIP NITINOL 2.4FR (BASKET) IMPLANT
BSKT STON RTRVL ZERO TP 2.4FR (BASKET)
CATH INTERMIT  6FR 70CM (CATHETERS) ×1 IMPLANT
CATH URET 5FR 28IN CONE TIP (BALLOONS)
CATH URET 5FR 70CM CONE TIP (BALLOONS) IMPLANT
CLOTH BEACON ORANGE TIMEOUT ST (SAFETY) ×2 IMPLANT
EXTRACTOR STONE 1.7FRX115CM (UROLOGICAL SUPPLIES) IMPLANT
FIBER LASER FLEXIVA 365 (UROLOGICAL SUPPLIES) IMPLANT
FIBER LASER TRAC TIP (UROLOGICAL SUPPLIES) IMPLANT
GLOVE BIO SURGEON STRL SZ8 (GLOVE) ×2 IMPLANT
GOWN STRL REUS W/TWL XL LVL3 (GOWN DISPOSABLE) ×2 IMPLANT
GUIDEWIRE ANG ZIPWIRE 038X150 (WIRE) IMPLANT
GUIDEWIRE STR DUAL SENSOR (WIRE) ×2 IMPLANT
IV NS IRRIG 3000ML ARTHROMATIC (IV SOLUTION) ×4 IMPLANT
KIT TURNOVER CYSTO (KITS) ×2 IMPLANT
MANIFOLD NEPTUNE II (INSTRUMENTS) ×2 IMPLANT
NS IRRIG 500ML POUR BTL (IV SOLUTION) ×1 IMPLANT
TRAY CYSTO PACK (CUSTOM PROCEDURE TRAY) ×2 IMPLANT
TUBE CONNECTING 12X1/4 (SUCTIONS) ×1 IMPLANT
TUBING UROLOGY SET (TUBING) ×2 IMPLANT
WATER STERILE IRR 3000ML UROMA (IV SOLUTION) IMPLANT

## 2019-08-28 NOTE — Op Note (Signed)
PATIENT:  Benjamin Cardenas  PRE-OPERATIVE DIAGNOSIS: 1.  Bilateral hydronephrosis. 2.  History of urinary retention.  POST-OPERATIVE DIAGNOSIS: Same  PROCEDURE: 1.  Cystoscopy with attempted location of right and left ureteral orifice.  SURGEON:  Claybon Jabs  INDICATION: Benjamin Cardenas is a 86 year old male with a history of bilateral hydronephrosis and bladder cancer.  He also had urinary retention and underwent a previous TURP.  His hydronephrosis was initially discovered in 5/20 and at that time he was found to have a normal creatinine.  His bladder was distended and a Foley catheter was inserted on 09/20/2018 and a follow-up MRI scan of the spine revealed persistent right hydronephrosis with some left hydronephrosis with ureter is dilated down to the level of the bladder on the CT scan.  Because of urinary retention he underwent a TURP on 11/07/2018 at which time I noted inflamed bladder that was very small capacity and the bladder was biopsied revealing no evidence of cancer.  When he was seen on 07/13/2019 he was again noted to have bilateral hydronephrosis by ultrasound that appeared relatively stable compared to previous ultrasound done 1 year previously at which time his creatinine was then 1.1.  His creatinine was found to have increased to 2.55 with a BUN of 59 suggesting dehydration contributing to his elevated creatinine.  His ultrasound also did not reveal a distended bladder but he was reportedly found to have 500 cc in his bladder at the time of an ER visit on 07/14/2019 and a Foley catheter was inserted.  His catheter is remained indwelling and he has undergone a preoperative urine culture which was positive for yeast only and has been on fluconazole.  He presents to the operating room today for attempted bilateral retrograde pyelograms and ureteroscopy with stent placement and evaluation of his prostatic urethra for the need for further prostate tissue resection.  He received 1 g of  gentamicin preoperatively as well as a creatinine based dose of gentamicin per pharmacy protocol.  ANESTHESIA:  General  EBL:  Minimal  DRAINS: None  LOCAL MEDICATIONS USED:  None  SPECIMEN: Urine for C&S.  Description of procedure: After informed consent the patient was taken to the operating room and placed on the table in a supine position. General anesthesia was then administered. Once fully anesthetized the patient was moved to the dorsal lithotomy position and the genitalia were sterilely prepped and draped in standard fashion. An official timeout was then performed.  The 23 French cystoscope was advanced down the urethra which was noted to be entirely normal.  The prostatic urethra was noted to be completely resected with no residual obstructing adenomatous tissue and a wide open bladder neck.  Upon entering the bladder I noted the bladder was very inflamed appearing.  Urine was obtained for culture and I then filled the bladder to capacity and drained the bladder and found the bladder capacity to be <100 cc.  I spent a great deal of time searching for his right and left ureteral orifice with both the 30 and 70 degree lens.  I also passed an open-ended catheter and a sensor wire and probed several locations which I thought appeared to be the ureteral orifice on each side based on the anatomic location but despite this was unable to access either right or left ureteral orifice in order to perform a retrograde pyelogram.  I therefore removed the cystoscope.  I left his Foley catheter out since he was not obstructed in any way but has a very  small bladder capacity and we will see how he does in the recovery room.  I think aggressive management of his bladder inflammation will likely be the best way to manage the obstruction of his ureters which appears like it is going to be due to a thick, inflamed bladder wall.  I would favor this over placement of bilateral nephrostomy tubes initially.  PLAN  OF CARE: Discharge to home after PACU  PATIENT DISPOSITION:  PACU - hemodynamically stable.

## 2019-08-28 NOTE — Anesthesia Procedure Notes (Signed)
Procedure Name: LMA Insertion Date/Time: 08/28/2019 7:52 AM Performed by: Mechele Claude, CRNA Pre-anesthesia Checklist: Patient identified, Emergency Drugs available, Suction available and Patient being monitored Patient Re-evaluated:Patient Re-evaluated prior to induction Oxygen Delivery Method: Circle system utilized Preoxygenation: Pre-oxygenation with 100% oxygen Induction Type: IV induction Ventilation: Mask ventilation without difficulty LMA: LMA inserted LMA Size: 4.0 Number of attempts: 1 Airway Equipment and Method: Bite block Placement Confirmation: positive ETCO2 Tube secured with: Tape Dental Injury: Teeth and Oropharynx as per pre-operative assessment

## 2019-08-28 NOTE — Anesthesia Preprocedure Evaluation (Signed)
Anesthesia Evaluation  Patient identified by MRN, date of birth, ID band Patient awake    Reviewed: Allergy & Precautions, NPO status , Patient's Chart, lab work & pertinent test results  History of Anesthesia Complications Negative for: history of anesthetic complications  Airway Mallampati: II  TM Distance: >3 FB Neck ROM: Full    Dental  (+) Teeth Intact   Pulmonary neg pulmonary ROS, former smoker,    Pulmonary exam normal        Cardiovascular negative cardio ROS Normal cardiovascular exam     Neuro/Psych negative neurological ROS  negative psych ROS   GI/Hepatic negative GI ROS, Neg liver ROS,   Endo/Other  negative endocrine ROS  Renal/GU Renal InsufficiencyRenal disease (bilateral hydronephrosis)   BPH H/o bladder cancer    Musculoskeletal  (+) Arthritis ,   Abdominal   Peds  Hematology  (+) anemia ,   Anesthesia Other Findings  Echo 2018: EF 60-65%, PASP 37, valves unremarkable  Reproductive/Obstetrics                            Anesthesia Physical Anesthesia Plan  ASA: III  Anesthesia Plan: General   Post-op Pain Management:    Induction: Intravenous  PONV Risk Score and Plan: 2 and Ondansetron, Dexamethasone and Treatment may vary due to age or medical condition  Airway Management Planned: LMA  Additional Equipment: None  Intra-op Plan:   Post-operative Plan: Extubation in OR  Informed Consent: I have reviewed the patients History and Physical, chart, labs and discussed the procedure including the risks, benefits and alternatives for the proposed anesthesia with the patient or authorized representative who has indicated his/her understanding and acceptance.     Dental advisory given  Plan Discussed with:   Anesthesia Plan Comments:       Anesthesia Quick Evaluation

## 2019-08-28 NOTE — Transfer of Care (Signed)
  Last Vitals:  Vitals Value Taken Time  BP 114/64 08/28/19 0836  Temp    Pulse 59 08/28/19 0840  Resp 14 08/28/19 0840  SpO2 100 % 08/28/19 0840  Vitals shown include unvalidated device data.  Last Pain:  Vitals:   08/28/19 0607  PainSc: 0-No pain      Patients Stated Pain Goal: 5 (08/28/19 2500)  Immediate Anesthesia Transfer of Care Note  Patient: Benjamin Cardenas  Procedure(s) Performed: Procedure(s) (LRB): CYSTOSCOPY (N/A)  Patient Location: PACU  Anesthesia Type: General  Level of Consciousness: awake, alert  and oriented  Airway & Oxygen Therapy: Patient Spontanous Breathing and Patient connected to nasal cannula oxygen  Post-op Assessment: Report given to PACU RN and Post -op Vital signs reviewed and stable  Post vital signs: Reviewed and stable  Complications: No apparent anesthesia complications

## 2019-08-28 NOTE — Discharge Instructions (Signed)
Cystoscopy patient instructions  Following a cystoscopy, a catheter (a flexible rubber tube) is sometimes left in place to empty the bladder. This may cause some discomfort or a feeling that you need to urinate. Your doctor determines the period of time that the catheter will be left in place. You may have bloody urine for two to three days (Call your doctor if the amount of bleeding increases or does not subside).  You may pass blood clots in your urine, especially if you had a biopsy. It is not unusual to pass small blood clots and have some bloody urine a couple of weeks after your cystoscopy. Again, call your doctor if the bleeding does not subside. You may have: Dysuria (painful urination) Frequency (urinating often) Urgency (strong desire to urinate)  These symptoms are common especially if medicine is instilled into the bladder or a ureteral stent is placed. Avoiding alcohol and caffeine, such as coffee, tea, and chocolate, may help relieve these symptoms. Drink plenty of water, unless otherwise instructed. Your doctor may also prescribe an antibiotic or other medicine to reduce these symptoms.  Cystoscopy results are available soon after the procedure; biopsy results usually take two to four days. Your doctor will discuss the results of your exam with you. Before you go home, you will be given specific instructions for follow-up care. Special Instructions:  1 If you are going home with a catheter in place do not take a tub bath until removed by your doctor.  2 You may resume your normal activities.  3 Do not drive or operate machinery if you are taking narcotic pain medicine.  4 Be sure to keep all follow-up appointments with your doctor.   5 Call Your Doctor If: The catheter is not draining  You have severe pain  You are unable to urinate  You have a fever over 101  You have severe bleeding           Post Anesthesia Home Care Instructions  Activity: Get plenty of rest for  the remainder of the day. A responsible individual must stay with you for 24 hours following the procedure.  For the next 24 hours, DO NOT: -Drive a car -Operate machinery -Drink alcoholic beverages -Take any medication unless instructed by your physician -Make any legal decisions or sign important papers.  Meals: Start with liquid foods such as gelatin or soup. Progress to regular foods as tolerated. Avoid greasy, spicy, heavy foods. If nausea and/or vomiting occur, drink only clear liquids until the nausea and/or vomiting subsides. Call your physician if vomiting continues.  Special Instructions/Symptoms: Your throat may feel dry or sore from the anesthesia or the breathing tube placed in your throat during surgery. If this causes discomfort, gargle with warm salt water. The discomfort should disappear within 24 hours.        

## 2019-08-28 NOTE — Anesthesia Postprocedure Evaluation (Signed)
Anesthesia Post Note  Patient: Benjamin Cardenas  Procedure(s) Performed: CYSTOSCOPY (N/A Bladder)     Patient location during evaluation: PACU Anesthesia Type: General Level of consciousness: awake and alert Pain management: pain level controlled Vital Signs Assessment: post-procedure vital signs reviewed and stable Respiratory status: spontaneous breathing, nonlabored ventilation and respiratory function stable Cardiovascular status: blood pressure returned to baseline and stable Postop Assessment: no apparent nausea or vomiting Anesthetic complications: no    Last Vitals:  Vitals:   08/28/19 0915 08/28/19 0930  BP: 103/66 108/69  Pulse: (!) 59 (!) 59  Resp: 13 17  Temp:  (!) 36.3 C  SpO2: 98% 100%    Last Pain:  Vitals:   08/28/19 1006  PainSc: 8                  Jaelynn Currier E Kaedyn Belardo

## 2019-08-30 LAB — URINE CULTURE: Culture: 40000 — AB

## 2019-09-19 ENCOUNTER — Telehealth (INDEPENDENT_AMBULATORY_CARE_PROVIDER_SITE_OTHER): Payer: Medicare Other | Admitting: Internal Medicine

## 2019-09-19 VITALS — Wt 124.0 lb

## 2019-09-19 DIAGNOSIS — R634 Abnormal weight loss: Secondary | ICD-10-CM | POA: Diagnosis not present

## 2019-09-19 NOTE — Progress Notes (Signed)
Virtual Visit via Telephone Note  I connected with Benjamin Cardenas on 09/19/19 at  3:30 PM EDT by telephone and verified that I am speaking with the correct person using two identifiers.   I discussed the limitations, risks, security and privacy concerns of performing an evaluation and management service by telephone and the availability of in person appointments. I also discussed with the patient that there may be a patient responsible charge related to this service. The patient expressed understanding and agreed to proceed.  Location patient: home Location provider: work office Participants present for the call: patient, provider, wife Pamala Hurry Patient did not have a visit in the prior 7 days to address this/these issue(s).   History of Present Illness:  Patient and his wife has scheduled this visit to discuss some acute concerns with weight loss.  His wife believes that he has been losing half a pound to a pound today.  Weight as of this morning was 124 pounds.  We last measured his weight to be 139 pounds in October 2020 for his physical.  That would make his actual weight loss 15 pounds over the course of 8 months.  She states he has at least three good meals a day.  He has been dealing with issues in regards to urinary retention, multiple UTIs and cystoscopies.  He is under the care of urology.   Observations/Objective: Patient sounds cheerful and well on the phone. I do not appreciate any increased work of breathing. Speech and thought processing are grossly intact. Patient reported vitals: None reported   Current Outpatient Medications:  .  aspirin EC 81 MG tablet, Take 81 mg by mouth daily. , Disp: , Rfl:  .  cetirizine (ZYRTEC) 10 MG tablet, Take 10 mg by mouth at bedtime. , Disp: , Rfl:  .  naphazoline-pheniramine (NAPHCON-A) 0.025-0.3 % ophthalmic solution, Place 1 drop into both eyes 4 (four) times daily as needed for eye irritation. , Disp: , Rfl:  .  TRULANCE 3 MG TABS,  Take 3 mg by mouth every morning. , Disp: , Rfl:  .  vitamin B-12 1000 MCG tablet, Take 1 tablet (1,000 mcg total) by mouth daily., Disp: 30 tablet, Rfl: 0  Review of Systems:  Constitutional: Denies fever, chills, diaphoresis, appetite change and fatigue.  HEENT: Denies photophobia, eye pain, redness, hearing loss, ear pain, congestion, sore throat, rhinorrhea, sneezing, mouth sores, trouble swallowing, neck pain, neck stiffness and tinnitus.   Respiratory: Denies SOB, DOE, cough, chest tightness,  and wheezing.   Cardiovascular: Denies chest pain, palpitations and leg swelling.  Gastrointestinal: Denies nausea, vomiting, abdominal pain, diarrhea, constipation, blood in stool and abdominal distention.  Genitourinary: Denies dysuria, urgency, frequency, hematuria, flank pain and difficulty urinating.  Endocrine: Denies: hot or cold intolerance, sweats, changes in hair or nails, polyuria, polydipsia. Musculoskeletal: Denies myalgias, back pain, joint swelling, arthralgias and gait problem.  Skin: Denies pallor, rash and wound.  Neurological: Denies dizziness, seizures, syncope, weakness, light-headedness, numbness and headaches.  Hematological: Denies adenopathy. Easy bruising, personal or family bleeding history  Psychiatric/Behavioral: Denies suicidal ideation, mood changes, confusion, nervousness, sleep disturbance and agitation   Assessment and Plan:  Weight loss -No concerning signs/symptoms at this time. -Weight loss does not appear to be as steep as she is describing although he certainly has lost 15 pounds over the course of the last 8 months, suspect due to age and ongoing urologic issues. -Have advised three good quality meals a day, can add 1-2 protein shakes a  day. -If he continues to notice weight loss have advised repeat office visit to discuss.   I discussed the assessment and treatment plan with the patient. The patient was provided an opportunity to ask questions and all  were answered. The patient agreed with the plan and demonstrated an understanding of the instructions.   The patient was advised to call back or seek an in-person evaluation if the symptoms worsen or if the condition fails to improve as anticipated.  I provided 20 minutes of non-face-to-face time during this encounter.   Lelon Frohlich, MD [facility]

## 2019-09-25 DIAGNOSIS — N184 Chronic kidney disease, stage 4 (severe): Secondary | ICD-10-CM | POA: Diagnosis not present

## 2019-09-28 ENCOUNTER — Telehealth: Payer: Self-pay | Admitting: Internal Medicine

## 2019-09-28 NOTE — Telephone Encounter (Signed)
Wife states patient is now receiving 24 hour home care- pt can't walk on his own, incontinent, "very passive", health condition has decreased dramatically since last week. Patient's wife is requesting that Benjamin Cardenas call her.

## 2019-09-28 NOTE — Telephone Encounter (Signed)
Spoke with wife and she has requested a referral to Hospice.  Form faxed and confirmed.

## 2019-09-29 ENCOUNTER — Emergency Department (HOSPITAL_COMMUNITY): Payer: Medicare Other

## 2019-09-29 ENCOUNTER — Inpatient Hospital Stay (HOSPITAL_COMMUNITY)
Admission: AD | Admit: 2019-09-29 | Discharge: 2019-10-02 | DRG: 871 | Disposition: A | Discharge status: Hospice | Payer: Medicare Other | Attending: Family Medicine | Admitting: Family Medicine

## 2019-09-29 ENCOUNTER — Inpatient Hospital Stay (HOSPITAL_COMMUNITY): Payer: Medicare Other

## 2019-09-29 ENCOUNTER — Encounter (HOSPITAL_COMMUNITY): Payer: Self-pay

## 2019-09-29 ENCOUNTER — Other Ambulatory Visit: Payer: Self-pay

## 2019-09-29 DIAGNOSIS — R279 Unspecified lack of coordination: Secondary | ICD-10-CM | POA: Diagnosis not present

## 2019-09-29 DIAGNOSIS — R6520 Severe sepsis without septic shock: Secondary | ICD-10-CM

## 2019-09-29 DIAGNOSIS — Z8551 Personal history of malignant neoplasm of bladder: Secondary | ICD-10-CM

## 2019-09-29 DIAGNOSIS — R531 Weakness: Secondary | ICD-10-CM | POA: Diagnosis not present

## 2019-09-29 DIAGNOSIS — Z743 Need for continuous supervision: Secondary | ICD-10-CM | POA: Diagnosis not present

## 2019-09-29 DIAGNOSIS — N132 Hydronephrosis with renal and ureteral calculous obstruction: Secondary | ICD-10-CM | POA: Diagnosis not present

## 2019-09-29 DIAGNOSIS — Z20822 Contact with and (suspected) exposure to covid-19: Secondary | ICD-10-CM | POA: Diagnosis present

## 2019-09-29 DIAGNOSIS — L89211 Pressure ulcer of right hip, stage 1: Secondary | ICD-10-CM | POA: Diagnosis not present

## 2019-09-29 DIAGNOSIS — R52 Pain, unspecified: Secondary | ICD-10-CM | POA: Diagnosis not present

## 2019-09-29 DIAGNOSIS — N401 Enlarged prostate with lower urinary tract symptoms: Secondary | ICD-10-CM | POA: Diagnosis present

## 2019-09-29 DIAGNOSIS — N170 Acute kidney failure with tubular necrosis: Secondary | ICD-10-CM | POA: Diagnosis present

## 2019-09-29 DIAGNOSIS — Z9079 Acquired absence of other genital organ(s): Secondary | ICD-10-CM | POA: Diagnosis not present

## 2019-09-29 DIAGNOSIS — C679 Malignant neoplasm of bladder, unspecified: Secondary | ICD-10-CM | POA: Diagnosis not present

## 2019-09-29 DIAGNOSIS — E875 Hyperkalemia: Secondary | ICD-10-CM | POA: Diagnosis present

## 2019-09-29 DIAGNOSIS — Z7982 Long term (current) use of aspirin: Secondary | ICD-10-CM | POA: Diagnosis not present

## 2019-09-29 DIAGNOSIS — Z87891 Personal history of nicotine dependence: Secondary | ICD-10-CM | POA: Diagnosis not present

## 2019-09-29 DIAGNOSIS — E870 Hyperosmolality and hypernatremia: Secondary | ICD-10-CM | POA: Diagnosis not present

## 2019-09-29 DIAGNOSIS — R0689 Other abnormalities of breathing: Secondary | ICD-10-CM | POA: Diagnosis not present

## 2019-09-29 DIAGNOSIS — R64 Cachexia: Secondary | ICD-10-CM | POA: Diagnosis present

## 2019-09-29 DIAGNOSIS — G9341 Metabolic encephalopathy: Secondary | ICD-10-CM

## 2019-09-29 DIAGNOSIS — E872 Acidosis, unspecified: Secondary | ICD-10-CM | POA: Diagnosis present

## 2019-09-29 DIAGNOSIS — Z7189 Other specified counseling: Secondary | ICD-10-CM

## 2019-09-29 DIAGNOSIS — R571 Hypovolemic shock: Secondary | ICD-10-CM | POA: Diagnosis present

## 2019-09-29 DIAGNOSIS — R4182 Altered mental status, unspecified: Secondary | ICD-10-CM | POA: Diagnosis present

## 2019-09-29 DIAGNOSIS — E43 Unspecified severe protein-calorie malnutrition: Secondary | ICD-10-CM | POA: Diagnosis present

## 2019-09-29 DIAGNOSIS — N189 Chronic kidney disease, unspecified: Secondary | ICD-10-CM | POA: Diagnosis not present

## 2019-09-29 DIAGNOSIS — G92 Toxic encephalopathy: Secondary | ICD-10-CM | POA: Diagnosis present

## 2019-09-29 DIAGNOSIS — N179 Acute kidney failure, unspecified: Secondary | ICD-10-CM | POA: Diagnosis present

## 2019-09-29 DIAGNOSIS — Z681 Body mass index (BMI) 19 or less, adult: Secondary | ICD-10-CM | POA: Diagnosis not present

## 2019-09-29 DIAGNOSIS — Z936 Other artificial openings of urinary tract status: Secondary | ICD-10-CM | POA: Diagnosis not present

## 2019-09-29 DIAGNOSIS — I959 Hypotension, unspecified: Secondary | ICD-10-CM | POA: Diagnosis not present

## 2019-09-29 DIAGNOSIS — N1832 Chronic kidney disease, stage 3b: Secondary | ICD-10-CM | POA: Diagnosis present

## 2019-09-29 DIAGNOSIS — I248 Other forms of acute ischemic heart disease: Secondary | ICD-10-CM | POA: Diagnosis present

## 2019-09-29 DIAGNOSIS — Z79899 Other long term (current) drug therapy: Secondary | ICD-10-CM

## 2019-09-29 DIAGNOSIS — Z515 Encounter for palliative care: Secondary | ICD-10-CM

## 2019-09-29 DIAGNOSIS — L89151 Pressure ulcer of sacral region, stage 1: Secondary | ICD-10-CM | POA: Diagnosis not present

## 2019-09-29 DIAGNOSIS — A419 Sepsis, unspecified organism: Secondary | ICD-10-CM | POA: Diagnosis not present

## 2019-09-29 DIAGNOSIS — N320 Bladder-neck obstruction: Secondary | ICD-10-CM | POA: Diagnosis present

## 2019-09-29 DIAGNOSIS — E860 Dehydration: Secondary | ICD-10-CM | POA: Diagnosis present

## 2019-09-29 DIAGNOSIS — R400 Somnolence: Secondary | ICD-10-CM

## 2019-09-29 DIAGNOSIS — N138 Other obstructive and reflux uropathy: Secondary | ICD-10-CM | POA: Diagnosis not present

## 2019-09-29 DIAGNOSIS — R778 Other specified abnormalities of plasma proteins: Secondary | ICD-10-CM | POA: Diagnosis present

## 2019-09-29 DIAGNOSIS — R410 Disorientation, unspecified: Secondary | ICD-10-CM | POA: Diagnosis not present

## 2019-09-29 DIAGNOSIS — N1330 Unspecified hydronephrosis: Secondary | ICD-10-CM | POA: Diagnosis not present

## 2019-09-29 DIAGNOSIS — Z8546 Personal history of malignant neoplasm of prostate: Secondary | ICD-10-CM

## 2019-09-29 DIAGNOSIS — Z66 Do not resuscitate: Secondary | ICD-10-CM | POA: Diagnosis present

## 2019-09-29 DIAGNOSIS — G928 Other toxic encephalopathy: Secondary | ICD-10-CM | POA: Diagnosis present

## 2019-09-29 DIAGNOSIS — Z741 Need for assistance with personal care: Secondary | ICD-10-CM | POA: Diagnosis not present

## 2019-09-29 DIAGNOSIS — I129 Hypertensive chronic kidney disease with stage 1 through stage 4 chronic kidney disease, or unspecified chronic kidney disease: Secondary | ICD-10-CM | POA: Diagnosis present

## 2019-09-29 DIAGNOSIS — E871 Hypo-osmolality and hyponatremia: Secondary | ICD-10-CM | POA: Diagnosis present

## 2019-09-29 DIAGNOSIS — N136 Pyonephrosis: Secondary | ICD-10-CM | POA: Diagnosis present

## 2019-09-29 DIAGNOSIS — R7989 Other specified abnormal findings of blood chemistry: Secondary | ICD-10-CM | POA: Diagnosis present

## 2019-09-29 HISTORY — PX: IR NEPHROSTOMY PLACEMENT RIGHT: IMG6064

## 2019-09-29 HISTORY — PX: IR NEPHROSTOMY PLACEMENT LEFT: IMG6063

## 2019-09-29 LAB — NA AND K (SODIUM & POTASSIUM), RAND UR
Potassium Urine: 18 mmol/L
Sodium, Ur: 93 mmol/L

## 2019-09-29 LAB — URINALYSIS, ROUTINE W REFLEX MICROSCOPIC
Bilirubin Urine: NEGATIVE
Glucose, UA: 50 mg/dL — AB
Ketones, ur: NEGATIVE mg/dL
Nitrite: NEGATIVE
Protein, ur: 100 mg/dL — AB
RBC / HPF: >50 RBC/hpf — ABNORMAL HIGH (ref 0–5)
Specific Gravity, Urine: 1.01 (ref 1.005–1.030)
WBC, UA: >50 WBC/hpf — ABNORMAL HIGH (ref 0–5)
pH: 6 (ref 5.0–8.0)

## 2019-09-29 LAB — COMPREHENSIVE METABOLIC PANEL
ALT: 31 U/L (ref 0–44)
AST: 32 U/L (ref 15–41)
Albumin: 5.2 g/dL — ABNORMAL HIGH (ref 3.5–5.0)
Alkaline Phosphatase: 109 U/L (ref 38–126)
Anion gap: 17 — ABNORMAL HIGH (ref 5–15)
BUN: 228 mg/dL — ABNORMAL HIGH (ref 8–23)
CO2: 16 mmol/L — ABNORMAL LOW (ref 22–32)
Calcium: 10.4 mg/dL — ABNORMAL HIGH (ref 8.9–10.3)
Chloride: 89 mmol/L — ABNORMAL LOW (ref 98–111)
Creatinine, Ser: 6.29 mg/dL — ABNORMAL HIGH (ref 0.61–1.24)
GFR calc Af Amer: 8 mL/min — ABNORMAL LOW (ref >60)
GFR calc non Af Amer: 7 mL/min — ABNORMAL LOW (ref >60)
Glucose, Bld: 150 mg/dL — ABNORMAL HIGH (ref 70–99)
Potassium: 8 mmol/L (ref 3.5–5.1)
Sodium: 122 mmol/L — ABNORMAL LOW (ref 135–145)
Total Bilirubin: 0.7 mg/dL (ref 0.3–1.2)
Total Protein: 9.4 g/dL — ABNORMAL HIGH (ref 6.5–8.1)

## 2019-09-29 LAB — CBC WITH DIFFERENTIAL/PLATELET
Abs Immature Granulocytes: 0.07 10*3/uL (ref 0.00–0.07)
Basophils Absolute: 0 10*3/uL (ref 0.0–0.1)
Basophils Relative: 0 %
Eosinophils Absolute: 0 10*3/uL (ref 0.0–0.5)
Eosinophils Relative: 0 %
HCT: 42.5 % (ref 39.0–52.0)
Hemoglobin: 14.9 g/dL (ref 13.0–17.0)
Immature Granulocytes: 0 %
Lymphocytes Relative: 3 %
Lymphs Abs: 0.4 10*3/uL — ABNORMAL LOW (ref 0.7–4.0)
MCH: 30.2 pg (ref 26.0–34.0)
MCHC: 35.1 g/dL (ref 30.0–36.0)
MCV: 86.2 fL (ref 80.0–100.0)
Monocytes Absolute: 0.7 10*3/uL (ref 0.1–1.0)
Monocytes Relative: 4 %
Neutro Abs: 15.4 10*3/uL — ABNORMAL HIGH (ref 1.7–7.7)
Neutrophils Relative %: 93 %
Platelets: 421 10*3/uL — ABNORMAL HIGH (ref 150–400)
RBC: 4.93 MIL/uL (ref 4.22–5.81)
RDW: 13.5 % (ref 11.5–15.5)
WBC: 16.6 10*3/uL — ABNORMAL HIGH (ref 4.0–10.5)
nRBC: 0 % (ref 0.0–0.2)

## 2019-09-29 LAB — LACTIC ACID, PLASMA
Lactic Acid, Venous: 1.4 mmol/L (ref 0.5–1.9)
Lactic Acid, Venous: 1 mmol/L (ref 0.5–1.9)

## 2019-09-29 LAB — SARS CORONAVIRUS 2 BY RT PCR (HOSPITAL ORDER, PERFORMED IN ~~LOC~~ HOSPITAL LAB): SARS Coronavirus 2: NEGATIVE

## 2019-09-29 LAB — CREATININE, URINE, RANDOM: Creatinine, Urine: 47.49 mg/dL

## 2019-09-29 LAB — GLUCOSE, RANDOM: Glucose, Bld: 100 mg/dL — ABNORMAL HIGH (ref 70–99)

## 2019-09-29 LAB — PROTIME-INR
INR: 0.9 (ref 0.8–1.2)
Prothrombin Time: 12 seconds (ref 11.4–15.2)

## 2019-09-29 LAB — MRSA PCR SCREENING: MRSA by PCR: NEGATIVE

## 2019-09-29 LAB — OSMOLALITY: Osmolality: 354 mOsm/kg (ref 275–295)

## 2019-09-29 LAB — OSMOLALITY, URINE: Osmolality, Ur: 356 mOsm/kg (ref 300–900)

## 2019-09-29 LAB — CBG MONITORING, ED: Glucose-Capillary: 108 mg/dL — ABNORMAL HIGH (ref 70–99)

## 2019-09-29 LAB — TROPONIN I (HIGH SENSITIVITY)
Troponin I (High Sensitivity): 74 ng/L — ABNORMAL HIGH (ref <18)
Troponin I (High Sensitivity): 43 ng/L — ABNORMAL HIGH (ref <18)

## 2019-09-29 LAB — LIPASE, BLOOD: Lipase: 323 U/L — ABNORMAL HIGH (ref 11–51)

## 2019-09-29 MED ORDER — SODIUM CHLORIDE 0.9 % IV BOLUS (SEPSIS)
500.0000 mL | Freq: Once | INTRAVENOUS | Status: AC
  Administered 2019-09-29: 500 mL via INTRAVENOUS

## 2019-09-29 MED ORDER — HEPARIN SODIUM (PORCINE) 5000 UNIT/ML IJ SOLN
5000.0000 [IU] | Freq: Three times a day (TID) | INTRAMUSCULAR | Status: DC
  Administered 2019-09-29 – 2019-10-02 (×7): 5000 [IU] via SUBCUTANEOUS
  Filled 2019-09-29 (×6): qty 1

## 2019-09-29 MED ORDER — SODIUM CHLORIDE 0.9 % IV SOLN
2.0000 g | Freq: Once | INTRAVENOUS | Status: AC
  Administered 2019-09-29: 2 g via INTRAVENOUS
  Filled 2019-09-29: qty 2

## 2019-09-29 MED ORDER — IOHEXOL 300 MG/ML  SOLN
50.0000 mL | Freq: Once | INTRAMUSCULAR | Status: AC | PRN
  Administered 2019-09-29: 15 mL

## 2019-09-29 MED ORDER — ONDANSETRON HCL 4 MG PO TABS: 4.0000 mg | ORAL_TABLET | Freq: Four times a day (QID) | ORAL | Status: DC | PRN

## 2019-09-29 MED ORDER — SODIUM CHLORIDE 0.9 % IV BOLUS (SEPSIS): 250.0000 mL | Freq: Once | INTRAVENOUS | Status: DC

## 2019-09-29 MED ORDER — CALCIUM GLUCONATE-NACL 1-0.675 GM/50ML-% IV SOLN
1.0000 g | Freq: Once | INTRAVENOUS | Status: AC
  Administered 2019-09-29: 1000 mg via INTRAVENOUS
  Filled 2019-09-29: qty 50

## 2019-09-29 MED ORDER — LIDOCAINE HCL 1 % IJ SOLN
INTRAMUSCULAR | Status: AC
  Filled 2019-09-29: qty 20

## 2019-09-29 MED ORDER — SODIUM CHLORIDE 0.9 % IV BOLUS: 1000.0000 mL | Freq: Once | INTRAVENOUS | Status: DC

## 2019-09-29 MED ORDER — SODIUM CHLORIDE 0.9 % IV BOLUS (SEPSIS)
1000.0000 mL | Freq: Once | INTRAVENOUS | Status: AC
  Administered 2019-09-29: 1000 mL via INTRAVENOUS

## 2019-09-29 MED ORDER — ACETAMINOPHEN 650 MG RE SUPP
650.0000 mg | Freq: Four times a day (QID) | RECTAL | Status: DC | PRN
  Administered 2019-09-30 – 2019-10-02 (×3): 650 mg via RECTAL
  Filled 2019-09-29 (×4): qty 1

## 2019-09-29 MED ORDER — ACETAMINOPHEN 325 MG PO TABS: 650.0000 mg | ORAL_TABLET | Freq: Four times a day (QID) | ORAL | Status: DC | PRN

## 2019-09-29 MED ORDER — DEXTROSE 50 % IV SOLN
1.0000 | Freq: Once | INTRAVENOUS | Status: AC
  Administered 2019-09-29: 50 mL via INTRAVENOUS
  Filled 2019-09-29: qty 50

## 2019-09-29 MED ORDER — ONDANSETRON HCL 4 MG/2ML IJ SOLN: 4.0000 mg | Freq: Four times a day (QID) | INTRAMUSCULAR | Status: DC | PRN

## 2019-09-29 MED ORDER — SODIUM CHLORIDE 0.9 % IV SOLN: INTRAVENOUS | Status: DC

## 2019-09-29 MED ORDER — CHLORHEXIDINE GLUCONATE CLOTH 2 % EX PADS
6.0000 | MEDICATED_PAD | Freq: Every day | CUTANEOUS | Status: DC
  Administered 2019-09-29 – 2019-10-02 (×3): 6 via TOPICAL

## 2019-09-29 MED ORDER — SODIUM CHLORIDE 0.9% FLUSH
5.0000 mL | Freq: Three times a day (TID) | INTRAVENOUS | Status: DC
  Administered 2019-09-29 – 2019-10-02 (×5): 5 mL

## 2019-09-29 MED ORDER — INSULIN ASPART 100 UNIT/ML IV SOLN
5.0000 [IU] | Freq: Once | INTRAVENOUS | Status: AC
  Administered 2019-09-29: 5 [IU] via INTRAVENOUS
  Filled 2019-09-29: qty 0.05

## 2019-09-29 MED ORDER — SENNOSIDES-DOCUSATE SODIUM 8.6-50 MG PO TABS: 1.0000 | ORAL_TABLET | Freq: Every evening | ORAL | Status: DC | PRN

## 2019-09-29 NOTE — ED Notes (Signed)
Attempted report. Unable to take report at this time. Call back at [facility]

## 2019-09-29 NOTE — ED Provider Notes (Signed)
[name]   CSN: [id number] Arrival date & time: 09/29/19  1230     History Chief Complaint  Patient presents with  . Altered Mental Status    Benjamin Cardenas is a 86 y.o. male.  Level 5 caveat secondary to altered mental status.  Patient is arriving by EMS from the surgery center.  He was was to have her procedure today.  Lives at home with wife.  Concern today for generalized weakness for 1 week with increased confusion and worsening renal function.  The history is provided by the patient and the EMS personnel.  Altered Mental Status Presenting symptoms: disorientation and lethargy   Severity:  Moderate Most recent episode:  Today Episode history:  Continuous Timing:  Constant Progression:  Worsening      Past Medical History:  Diagnosis Date  . BPH with urinary obstruction   . Chronic constipation   . Chronic kidney disease    hydtronephrosis  . Chronic low back pain   . Cognitive impairment   . Dry skin dermatitis   . Elevated PSA   . Foley catheter in place    July 14, 2019  . History of bladder cancer    urologist--- dr Consuella Lose  . History of colon polyps   . History of sepsis    previous admission's for sepsis due to UTI's and CAP:   last admission's 05/ 2020 and 06/ 2020 sepsis secondary to UTI  . History of supraventricular tachycardia 11-03-2018  pt denies any symptoms since 2018   03/ 2018 during admission for CAP with sepsis-- episode of SVT;  pt cardiac evaluation after discharge 08-17-2016 (note in epic, b. bhagat PA),  normal echo,  event montior no sig. arrrhythmias heart rate range 36-120  . IDA (iron deficiency anemia)   . Lower urinary tract symptoms (LUTS)   . Lumbar stenosis   . OA (osteoarthritis)   . Prostate nodule   . Seasonal allergic rhinitis   . Urinary retention   . Wears glasses     Patient Active Problem List   Diagnosis Date Noted  . Hyperkalemia 07/04/2019  . Vitamin D  deficiency 01/27/2019  . BPH with urinary obstruction 11/07/2018  . Mild renal insufficiency 09/21/2018  . Sepsis due to urinary tract infection ([place]) 08/14/2018  . Sepsis secondary to UTI ([place]) 08/13/2018  . AKI (acute kidney injury) ([place]) 08/13/2018  . Acute encephalopathy 08/13/2018  . Bladder outlet obstruction 08/13/2018  . AAA (abdominal aortic aneurysm) without rupture ([place]) 08/13/2018  . MRSA (methicillin resistant Staphylococcus aureus) carrier 05/30/2018  . Bladder cancer ([place]) 03/10/2018  . CAP (community acquired pneumonia) 06/26/2016  . Hyponatremia 12/13/2015  . Anemia 12/13/2015  . Altered mental status 12/13/2015  . Constipation 08/17/2015  . Memory impairment 12/04/2014  . Keratosis pilaris 02/25/2011  . 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 Inguinal hernia unilateral, non-recurrent, left 01/28/2011  . OTHER SYMPTOMS INVOLVING CARDIOVASCULAR SYSTEM 09/20/2009  . Osteoarthritis 01/31/2009  . SCIATICA, ACUTE 12/20/2007  . NECK PAIN 02/15/2007  . BPH (benign prostatic hyperplasia) 01/06/2007  . History of colonic polyps 01/06/2007    Past Surgical History:  Procedure Laterality Date  . CATARACT EXTRACTION W/ INTRAOCULAR LENS  IMPLANT, BILATERAL  09/2017  . CYSTOSCOPY WITH BIOPSY N/A 04/22/2015   Procedure: CYSTOSCOPY WITH BLADDER BIOPSY;  Surgeon: Kathie Rhodes, MD;  Location: [facility];  Service: Urology;  Laterality: N/A;  . CYSTOSCOPY WITH RETROGRADE PYELOGRAM, URETEROSCOPY AND STENT PLACEMENT Left 02/25/2015  Procedure: LEFT RETROGRADE PYELOGRAM, URETEROSCOPY ;  Surgeon: Kathie Rhodes, MD;  Location: [facility];  Service: Urology;  Laterality: Left;  . CYSTOSCOPY WITH URETEROSCOPY AND STENT PLACEMENT N/A 08/28/2019   Procedure: CYSTOSCOPY;  Surgeon: Kathie Rhodes, MD;  Location: [facility];  Service: Urology;  Laterality: N/A;  . HERNIA REPAIR  age 53 1937   rupture right inguinal hernia  . INGUINAL HERNIA REPAIR   03/02/2011   Procedure: HERNIA REPAIR INGUINAL ADULT;  Surgeon: Earnstine Regal, MD;  Location: [facility];  Service: General;  Laterality: Left;  with mesh   . TRANSURETHRAL RESECTION OF BLADDER TUMOR WITH GYRUS (TURBT-GYRUS) N/A 02/25/2015   Procedure: TRANSURETHRAL RESECTION OF BLADDER TUMOR WITH GYRUS (TURBT-GYRUS);  Surgeon: Kathie Rhodes, MD;  Location: [facility];  Service: Urology;  Laterality: N/A;  . TRANSURETHRAL RESECTION OF PROSTATE N/A 11/07/2018   Procedure: TRANSURETHRAL RESECTION OF THE PROSTATE (TURP)/ BLADDER BIOPSY;  Surgeon: Kathie Rhodes, MD;  Location: [facility];  Service: Urology;  Laterality: N/A;       Family History  Problem Relation Age of Onset  . Heart disease Mother   . Heart disease Father   . Cancer Sister        breast  . Cancer Brother        colon    Social History   Tobacco Use  . Smoking status: Former Smoker    Packs/day: 1.50    Years: 34.00    Pack years: 51.00    Types: Cigarettes    Quit date: 02/17/1979    Years since quitting: 40.6  . Smokeless tobacco: Never Used  Vaping Use  . Vaping Use: Never used  Substance Use Topics  . Alcohol use: Not Currently  . Drug use: No    Home Medications Prior to Admission medications   Medication Sig Start Date End Date Taking? Authorizing Provider  aspirin EC 81 MG tablet Take 81 mg by mouth daily.     [provider]  cetirizine (ZYRTEC) 10 MG tablet Take 10 mg by mouth at bedtime.     [provider]  naphazoline-pheniramine (NAPHCON-A) 0.025-0.3 % ophthalmic solution Place 1 drop into both eyes 4 (four) times daily as needed for eye irritation.     [provider]  TRULANCE 3 MG TABS Take 3 mg by mouth every morning.  10/29/15   [provider]  vitamin B-12 1000 MCG tablet Take 1 tablet (1,000 mcg total) by mouth daily. 09/24/18   Raiford Noble Latif, DO    Allergies    Amoxicillin, Ciprofloxacin, Sulfasalazine, Sulfa  antibiotics, Doxycycline hyclate, and Levofloxacin  Review of Systems   Review of Systems  Unable to perform ROS: Mental status change    Physical Exam Updated Vital Signs BP (!) 127/91 (BP Location: Left Arm)   Pulse (!) 101   Temp (!) 96.6 F (35.9 C) (Rectal) Comment: Warm blankets applied at this time .  Resp 16   SpO2 100%   Physical Exam Vitals and nursing note reviewed.  Constitutional:      General: He is not in acute distress.    Appearance: Normal appearance. He is well-developed.  HENT:     Head: Normocephalic and atraumatic.  Eyes:     Conjunctiva/sclera: Conjunctivae normal.  Cardiovascular:     Rate and Rhythm: Normal rate and regular rhythm.     Heart sounds: No murmur heard.   Pulmonary:     Effort: Pulmonary effort is  normal. No respiratory distress.     Breath sounds: Normal breath sounds.  Abdominal:     Palpations: Abdomen is soft.     Tenderness: There is abdominal tenderness (suprapubic tenderness). There is no guarding or rebound.  Musculoskeletal:        General: Signs of injury present. Normal range of motion.     Cervical back: Neck supple.     Comments: Bruising over her knees.  Skin:    General: Skin is warm and dry.     Capillary Refill: Capillary refill takes less than 2 seconds.  Neurological:     Comments: Patient is arousable to voice and will open eyes.  Answers very simple questions with a quiet voice.  Follows commands with extremities moving nonfocally.  Not oriented to time place or situation.     ED Results / Procedures / Treatments   Labs (all labs ordered are listed, but only abnormal results are displayed) Labs Reviewed  COMPREHENSIVE METABOLIC PANEL - Abnormal; Notable for the following components:      Result Value   Sodium 122 (*)    Potassium 8.0 (*)    Chloride 89 (*)    CO2 16 (*)    Glucose, Bld 150 (*)    BUN 228 (*)    Creatinine, Ser 6.29 (*)    Calcium 10.4 (*)    Total Protein 9.4 (*)    Albumin 5.2  (*)    GFR calc non Af Amer 7 (*)    GFR calc Af Amer 8 (*)    Anion gap 17 (*)    All other components within normal limits  CBC WITH DIFFERENTIAL/PLATELET - Abnormal; Notable for the following components:   WBC 16.6 (*)    Platelets 421 (*)    Neutro Abs 15.4 (*)    Lymphs Abs 0.4 (*)    All other components within normal limits  URINALYSIS, ROUTINE W REFLEX MICROSCOPIC - Abnormal; Notable for the following components:   APPearance CLOUDY (*)    Glucose, UA 50 (*)    Hgb urine dipstick LARGE (*)    Protein, ur 100 (*)    Leukocytes,Ua LARGE (*)    RBC / HPF >50 (*)    WBC, UA >50 (*)    Bacteria, UA RARE (*)    All other components within normal limits  LIPASE, BLOOD - Abnormal; Notable for the following components:   Lipase 323 (*)    All other components within normal limits  CBG MONITORING, ED - Abnormal; Notable for the following components:   Glucose-Capillary 108 (*)    All other components within normal limits  TROPONIN I (HIGH SENSITIVITY) - Abnormal; Notable for the following components:   Troponin I (High Sensitivity) 74 (*)    All other components within normal limits  CULTURE, BLOOD (ROUTINE X 2)  CULTURE, BLOOD (ROUTINE X 2)  URINE CULTURE  SARS CORONAVIRUS 2 BY RT PCR (HOSPITAL ORDER, [facility])  LACTIC ACID, PLASMA  PROTIME-INR  LACTIC ACID, PLASMA  GLUCOSE, RANDOM  TROPONIN I (HIGH SENSITIVITY)    EKG EKG Interpretation  Date/Time:  Friday September 29 2019 12:42:18 EDT Ventricular Rate:  100 PR Interval:    QRS Duration: 119 QT Interval:  334 QTC Calculation: 431 R Axis:   -86 Text Interpretation: Sinus or ectopic atrial tachycardia Left anterior fascicular block increased rate and peaked t waves comapred with prior 6/20 Confirmed by Aletta Edouard [phone]) on 09/29/2019 1:11:11 PM  Radiology DG Chest Port 1 View  Result Date: 09/29/2019 CLINICAL DATA:  Cough, weakness EXAM: PORTABLE CHEST 1 VIEW COMPARISON:   08/13/2018 FINDINGS: The heart size and mediastinal contours are within normal limits. Atherosclerotic calcification of the aortic knob. Both lungs are clear. The visualized skeletal structures are unremarkable. IMPRESSION: No active disease. Electronically Signed   By: Davina Poke D.O.   On: 09/29/2019 13:38    Procedures .Critical Care Performed by: Hayden Rasmussen, MD Authorized by: Hayden Rasmussen, MD   Critical care provider statement:    Critical care time (minutes):  90   Critical care time was exclusive of:  Separately billable procedures and treating other patients   Critical care was necessary to treat or prevent imminent or life-threatening deterioration of the following conditions:  Metabolic crisis and sepsis   Critical care was time spent personally by me on the following activities:  Discussions with consultants, evaluation of patient's response to treatment, examination of patient, ordering and performing treatments and interventions, ordering and review of laboratory studies, ordering and review of radiographic studies, pulse oximetry, re-evaluation of patient's condition, obtaining history from patient or surrogate, review of old charts and development of treatment plan with patient or surrogate   I assumed direction of critical care for this patient from another provider in my specialty: no     (including critical care time)  Medications Ordered in ED Medications  sodium chloride 0.9 % bolus 1,000 mL (0 mLs Intravenous Stopping Infusion hung by another clincian 09/29/19 2307)    And  sodium chloride 0.9 % bolus 500 mL (0 mLs Intravenous Stopped 09/29/19 1427)    And  sodium chloride 0.9 % bolus 250 mL (250 mLs Intravenous Not Given 09/29/19 2308)  0.9 %  sodium chloride infusion ( Intravenous Restarted 09/30/19 0541)  lidocaine (XYLOCAINE) 1 % (with pres) injection (  Not Given 09/29/19 2309)  sodium chloride flush (NS) 0.9 % injection 5 mL (5 mLs Intracatheter Given  09/30/19 0526)  heparin injection 5,000 Units (5,000 Units Subcutaneous Given 09/30/19 0521)  acetaminophen (TYLENOL) tablet 650 mg (has no administration in time range)    Or  acetaminophen (TYLENOL) suppository 650 mg (has no administration in time range)  senna-docusate (Senokot-S) tablet 1 tablet (has no administration in time range)  ondansetron (ZOFRAN) tablet 4 mg (has no administration in time range)    Or  ondansetron (ZOFRAN) injection 4 mg (has no administration in time range)  Chlorhexidine Gluconate Cloth 2 % PADS 6 each (6 each Topical Given 09/29/19 2200)  ceFEPIme (MAXIPIME) 1 g in sodium chloride 0.9 % 100 mL IVPB (has no administration in time range)  MEDLINE mouth rinse (15 mLs Mouth Rinse Given 09/30/19 1004)  ceFEPIme (MAXIPIME) 2 g in sodium chloride 0.9 % 100 mL IVPB (0 g Intravenous Stopped 09/29/19 1512)  calcium gluconate 1 g/ 50 mL sodium chloride IVPB (0 g Intravenous Stopped 09/29/19 1535)  insulin aspart (novoLOG) injection 5 Units (5 Units Intravenous Given 09/29/19 1521)    And  dextrose 50 % solution 50 mL (50 mLs Intravenous Given 09/29/19 1525)  iohexol (OMNIPAQUE) 300 MG/ML solution 50 mL (15 mLs Other Contrast Given 09/29/19 1844)  calcium gluconate 1 g/ 50 mL sodium chloride IVPB (0 mg Intravenous Stopped 09/30/19 0541)  insulin aspart (novoLOG) injection 5 Units (5 Units Intravenous Given 09/30/19 0528)    And  dextrose 50 % solution 50 mL (50 mLs Intravenous Given 09/30/19 0528)  insulin aspart (novoLOG) injection 5 Units (5  Units Intravenous Given 09/30/19 1003)    And  dextrose 50 % solution 50 mL (50 mLs Intravenous Given 09/30/19 1002)    ED Course  I have reviewed the triage vital signs and the nursing notes.  Pertinent labs & imaging results that were available during my care of the patient were reviewed by me and considered in my medical decision making (see chart for details).  Clinical Course as of Sep 29 1140  Fri Sep 29, 2019  1342 Chest x-ray  interpreted by me as no gross infiltrates.   [MB]  0981 Discussed with Dr. Moshe Cipro.  She will see the patient in consult.  Secure messaged Dr. Gloriann Loan from urology who recommends imaging and is also trying to reach interventional radiology.   [MB]  1914 Discussed with interventional radiology.  They asked if we could get a Noncon CT and place orders for nephrostomy tubes.  Patient is to be n.p.o.   [MB]  7829 Discussed with [facility] hospitalist Dr. Lonny Prude who will evaluate the patient for admission.   [MB]    Clinical Course User Index [MB] Hayden Rasmussen, MD   MDM Rules/Calculators/A&P                         86 year old male with ongoing urologic issues here after being seen at the surgical center.  Worsening renal function.  Concern from urology that will need nephrostomy tubes.  Tachycardic with soft blood pressure.  This patient complains of somnolence agitation worsened renal function; this involves an extensive number of treatment Options and is a complaint that carries with it a high risk of complications and Morbidity. The differential includes metabolic encephalopathy, metabolic derangement, sepsis, urinary tract infection, obstruction  I ordered, reviewed and interpreted labs, which included CBC with elevated white count, stable hemoglobin; chemistries markedly abnormal with acute renal failure and hyperkalemia, urinalysis with signs of infection I ordered medication IV antibiotics and fluids, treatment for hyperkalemia with insulin glucose calcium I ordered imaging studies which included chest x-ray and CT renal and I independently    visualized and interpreted imaging which showed no gross infiltrates, CT with bilateral moderate hydronephrosis Additional history obtained from patient's wife Previous records obtained and reviewed in epic including urology procedure notes I consulted Dr. Gloriann Loan urology, Dr Clover Mealy nephrology, interventional radiology, Dr. Lonny Prude [facility]  hospitalist and discussed lab and imaging findings  Critical Interventions: Identification and management of hyperkalemia, ureteral obstruction with renal failure  After the interventions stated above, I reevaluated the patient and found to have slight improvement in his level of alertness.  Now more agitated.  Will need admission to the hospital for further management of his metabolic derangement and agitation.  Bilateral nephrostomies appear to be functioning well.  Final Clinical Impression(s) / ED Diagnoses Final diagnoses:  Hydronephrosis  AKI (acute kidney injury) ([place])  Acute hyperkalemia  Metabolic encephalopathy    Rx / DC Orders ED Discharge Orders    None       Hayden Rasmussen, MD 09/30/19 1146

## 2019-09-29 NOTE — Procedures (Signed)
Pre Procedure Dx: Hydronephrosis Post Procedural Dx: Same  Successful [place] and fluoroscopic guided placement of bilateral PCNs with ends coiled and locked within the bilateral renal pelves. Both PCNs connected to gravity bags.  EBL: Trace Complications: None immediate.  Ronny Bacon, MD Pager #: [phone]

## 2019-09-29 NOTE — Progress Notes (Signed)
A consult was received from an ED physician for cefepime per pharmacy dosing.  The patient's profile has been reviewed for ht/wt/allergies/indication/available labs.   A one time order has been placed for cefepime.  Further antibiotics/pharmacy consults should be ordered by admitting physician if indicated.                       Thank you, Peggyann Juba, PharmD, BCPS 09/29/2019  1:12 PM

## 2019-09-29 NOTE — ED Notes (Signed)
Pts IV fluid infusion slowly due to pts age and kidney function.

## 2019-09-29 NOTE — ED Notes (Signed)
RN Davy Pique attempted report, but IP RN has just arrived.

## 2019-09-29 NOTE — Consult Note (Addendum)
[facility] Renal Consultation Note  Requesting MD: Nettey Indication for Consultation: AKI and hyperkalemia in the setting of hydronephrosis due to bladder cancer  HPI:  Benjamin Cardenas is a 86 y.o. male with past medical history most notable for bladder cancer and multiple episodes of urinary retention-followed closely by Dr. Karsten Ro with urology.  Most recently noted to have attempted bilateral retrograde pyelograms with stent placement for bilateral hydronephrosis.  This was not successful.  Creatinine during that hospitalization was 1.7.  Patient since with known to have a virtual visit with his PCP on 6/15 and no complaints were noted- but yesterday patient's wife reported that patient was very weak, incontinent-had actually requested a hospice referral.  However, was brought to the surgery center for a reevaluation regarding this bladder issue.  Was hypotensive so sent to the emergency department.  Initial labs showed creatinine of 6.3 and potassium of 8.  He received treatment with calcium, insulin, dextrose.  Imaging again shows chronic bilateral hydroureteronephrosis-it was felt that all treatment was exhausted other than percutaneous nephrostomy tubes.  He is currently in interventional radiology receiving those nephrostomy tubes still unable to be personally evaluated by me.  No urine output is recorded.  Potassium has not been rechecked as of yet.  Addendum-  Was able to see pt after perc tube procedure... clear urine out of left-  Bloody urine out of right-  Pt with eyes open but really unresponsive   Creatinine, Ser  Date/Time Value Ref Range Status  09/29/2019 12:58 PM 6.29 (H) 0.61 - 1.24 mg/dL Final  08/28/2019 07:41 AM 1.70 (H) 0.61 - 1.24 mg/dL Final  08/04/2019 02:00 PM 1.79 (H) 0.61 - 1.24 mg/dL Final  07/14/2019 09:14 PM 2.55 (H) 0.61 - 1.24 mg/dL Final  07/11/2019 01:43 PM 2.27 (H) 0.40 - 1.50 mg/dL Final  07/03/2019 10:32 AM 2.08 (H) 0.40 - 1.50 mg/dL Final   05/03/2019 10:52 AM 1.66 (H) 0.40 - 1.50 mg/dL Final  01/27/2019 11:11 AM 1.60 (H) 0.40 - 1.50 mg/dL Final  09/23/2018 05:08 AM 1.03 0.61 - 1.24 mg/dL Final  09/22/2018 05:02 AM 1.11 0.61 - 1.24 mg/dL Final  09/21/2018 05:18 AM 1.20 0.61 - 1.24 mg/dL Final  09/20/2018 11:34 PM 1.34 (H) 0.61 - 1.24 mg/dL Final  08/17/2018 05:58 AM 1.11 0.61 - 1.24 mg/dL Final  08/16/2018 04:31 AM 1.26 (H) 0.61 - 1.24 mg/dL Final  08/15/2018 02:27 AM 1.56 (H) 0.61 - 1.24 mg/dL Final  08/14/2018 02:47 AM 1.89 (H) 0.61 - 1.24 mg/dL Final  08/13/2018 07:37 PM 2.29 (H) 0.61 - 1.24 mg/dL Final  01/06/2018 10:02 AM 1.41 0.40 - 1.50 mg/dL Final  01/01/2017 09:47 AM 1.04 0.40 - 1.50 mg/dL Final  07/06/2016 02:48 PM 0.83 0.40 - 1.50 mg/dL Final  06/29/2016 05:13 AM 0.70 0.61 - 1.24 mg/dL Final  06/28/2016 04:23 AM 0.73 0.61 - 1.24 mg/dL Final  06/26/2016 05:14 PM 0.92 0.61 - 1.24 mg/dL Final  06/25/2016 03:53 PM 0.89 0.40 - 1.50 mg/dL Final  01/25/2016 09:00 AM 0.70 0.61 - 1.24 mg/dL Final  12/23/2015 10:56 AM 0.82 0.40 - 1.50 mg/dL Final  12/19/2015 03:10 PM 0.69 0.40 - 1.50 mg/dL Final  12/17/2015 08:28 AM 0.70 0.61 - 1.24 mg/dL Final  12/15/2015 05:16 AM 0.82 0.61 - 1.24 mg/dL Final  12/14/2015 01:34 PM 0.78 0.61 - 1.24 mg/dL Final  12/14/2015 05:14 AM 0.74 0.61 - 1.24 mg/dL Final  12/13/2015 07:49 PM 0.96 0.61 - 1.24 mg/dL Final  12/13/2015 02:46 PM 0.87 0.40 - 1.50 mg/dL  Final  04/22/2015 10:17 AM 0.70 0.61 - 1.24 mg/dL Final  02/25/2015 08:14 AM 0.80 0.61 - 1.24 mg/dL Final  12/05/2014 08:13 AM 0.80 0.40 - 1.50 mg/dL Final  09/25/2013 09:42 AM 0.7 0.40 - 1.50 mg/dL Final  07/11/2012 01:49 AM 0.72 0.50 - 1.35 mg/dL Final  02/17/2011 11:10 AM 0.70 0.50 - 1.35 mg/dL Final  09/23/2010 09:52 AM 0.8 0.40 - 1.50 mg/dL Final  09/20/2009 12:00 AM 0.8 0.4 - 1.5 mg/dL Final  07/31/2008 12:00 AM 0.8 0.4 - 1.5 mg/dL Final  07/22/2007 10:39 AM 0.8 0.4 - 1.5 mg/dL Final  07/21/2006 11:26 AM 0.8 0.4 - 1.5 mg/dL  Final     PMHx:   Past Medical History:  Diagnosis Date  . BPH with urinary obstruction   . Chronic constipation   . Chronic kidney disease    hydtronephrosis  . Chronic low back pain   . Cognitive impairment   . Dry skin dermatitis   . Elevated PSA   . Foley catheter in place    July 14, 2019  . History of bladder cancer    urologist--- dr Consuella Lose  . History of colon polyps   . History of sepsis    previous admission's for sepsis due to UTI's and CAP:   last admission's 05/ 2020 and 06/ 2020 sepsis secondary to UTI  . History of supraventricular tachycardia 11-03-2018  pt denies any symptoms since 2018   03/ 2018 during admission for CAP with sepsis-- episode of SVT;  pt cardiac evaluation after discharge 08-17-2016 (note in epic, b. bhagat PA),  normal echo,  event montior no sig. arrrhythmias heart rate range 36-120  . IDA (iron deficiency anemia)   . Lower urinary tract symptoms (LUTS)   . Lumbar stenosis   . OA (osteoarthritis)   . Prostate nodule   . Seasonal allergic rhinitis   . Urinary retention   . Wears glasses     Past Surgical History:  Procedure Laterality Date  . CATARACT EXTRACTION W/ INTRAOCULAR LENS  IMPLANT, BILATERAL  09/2017  . CYSTOSCOPY WITH BIOPSY N/A 04/22/2015   Procedure: CYSTOSCOPY WITH BLADDER BIOPSY;  Surgeon: Kathie Rhodes, MD;  Location: [facility];  Service: Urology;  Laterality: N/A;  . CYSTOSCOPY WITH RETROGRADE PYELOGRAM, URETEROSCOPY AND STENT PLACEMENT Left 02/25/2015   Procedure: LEFT RETROGRADE PYELOGRAM, URETEROSCOPY ;  Surgeon: Kathie Rhodes, MD;  Location: [facility];  Service: Urology;  Laterality: Left;  . CYSTOSCOPY WITH URETEROSCOPY AND STENT PLACEMENT N/A 08/28/2019   Procedure: CYSTOSCOPY;  Surgeon: Kathie Rhodes, MD;  Location: [facility];  Service: Urology;  Laterality: N/A;  . HERNIA REPAIR  age 22 1937   rupture right inguinal hernia  . INGUINAL HERNIA REPAIR  03/02/2011    Procedure: HERNIA REPAIR INGUINAL ADULT;  Surgeon: Earnstine Regal, MD;  Location: [facility];  Service: General;  Laterality: Left;  with mesh   . TRANSURETHRAL RESECTION OF BLADDER TUMOR WITH GYRUS (TURBT-GYRUS) N/A 02/25/2015   Procedure: TRANSURETHRAL RESECTION OF BLADDER TUMOR WITH GYRUS (TURBT-GYRUS);  Surgeon: Kathie Rhodes, MD;  Location: [facility];  Service: Urology;  Laterality: N/A;  . TRANSURETHRAL RESECTION OF PROSTATE N/A 11/07/2018   Procedure: TRANSURETHRAL RESECTION OF THE PROSTATE (TURP)/ BLADDER BIOPSY;  Surgeon: Kathie Rhodes, MD;  Location: [employer];  Service: Urology;  Laterality: N/A;    Family Hx:  Family History  Problem Relation Age of Onset  . Heart disease Mother   . Heart disease Father   .  Cancer Sister        breast  . Cancer Brother        colon    Social History:  reports that he quit smoking about 40 years ago. His smoking use included cigarettes. He has a 51.00 pack-year smoking history. He has never used smokeless tobacco. He reports previous alcohol use. He reports that he does not use drugs.  Allergies:  Allergies  Allergen Reactions  . Amoxicillin Rash    Has patient had a PCN reaction causing immediate rash, facial/tongue/throat swelling, SOB or lightheadedness with hypotension:No Has patient had a PCN reaction causing severe rash involving mucus membranes or skin necrosis:unsure Has patient had a PCN reaction that required hospitalization:No Has patient had a PCN reaction occurring within the last 10 years:unsure If all of the above answers are "NO", then may proceed with Cephalosporin use.   . Ciprofloxacin Swelling and Rash  . Sulfasalazine Swelling  . Sulfa Antibiotics Swelling  . Doxycycline Hyclate Rash  . Levofloxacin Rash    Medications: Prior to Admission medications   Medication Sig Start Date End Date Taking? Authorizing Provider  cetirizine (ZYRTEC) 10 MG tablet Take 10 mg by mouth at bedtime.    Yes  [provider]  Cyanocobalamin (VITAMIN B 12 PO) Take 1 tablet by mouth daily.   Yes [provider]  naphazoline-pheniramine (NAPHCON-A) 0.025-0.3 % ophthalmic solution Place 1 drop into both eyes 4 (four) times daily as needed for eye irritation.    Yes [provider]  polyethylene glycol (MIRALAX / GLYCOLAX) 17 g packet Take 17 g by mouth daily as needed for mild constipation.   Yes [provider]  TRULANCE 3 MG TABS Take 3 mg by mouth every morning.  10/29/15  Yes [provider]  vitamin B-12 1000 MCG tablet Take 1 tablet (1,000 mcg total) by mouth daily. Patient not taking: Reported on 09/29/2019 09/24/18   Raiford Noble Latif, DO    I have reviewed the patient's current medications.  Labs:  Results for orders placed or performed during the hospital encounter of 09/29/19 (from the past 48 hour(s))  Comprehensive metabolic panel     Status: Abnormal   Collection Time: 09/29/19 12:58 PM  Result Value Ref Range   Sodium 122 (L) 135 - 145 mmol/L   Potassium 8.0 (HH) 3.5 - 5.1 mmol/L    Comment: NO VISIBLE HEMOLYSIS CRITICAL RESULT CALLED TO, READ BACK BY AND VERIFIED WITH: Sherrilee Gilles RN [id number] 09/29/19    Chloride 89 (L) 98 - 111 mmol/L   CO2 16 (L) 22 - 32 mmol/L   Glucose, Bld 150 (H) 70 - 99 mg/dL    Comment: Glucose reference range applies only to samples taken after fasting for at least 8 hours.   BUN 228 (H) 8 - 23 mg/dL    Comment: RESULTS CONFIRMED BY MANUAL DILUTION   Creatinine, Ser 6.29 (H) 0.61 - 1.24 mg/dL   Calcium 10.4 (H) 8.9 - 10.3 mg/dL   Total Protein 9.4 (H) 6.5 - 8.1 g/dL   Albumin 5.2 (H) 3.5 - 5.0 g/dL   AST 32 15 - 41 U/L   ALT 31 0 - 44 U/L   Alkaline Phosphatase 109 38 - 126 U/L   Total Bilirubin 0.7 0.3 - 1.2 mg/dL   GFR calc non Af Amer 7 (L) >60 mL/min   GFR calc Af Amer 8 (L) >60 mL/min   Anion gap 17 (H) 5 - 15    Comment: Performed at [facility]  [place] [address], [place], [place]  [address]  Lactic acid, plasma     Status: None   Collection Time: 09/29/19 12:58 PM  Result Value Ref Range   Lactic Acid, Venous 1.4 0.5 - 1.9 mmol/L    Comment: Performed at [facility], [place] [address] [place], [place] [address]  CBC with Differential     Status: Abnormal   Collection Time: 09/29/19 12:58 PM  Result Value Ref Range   WBC 16.6 (H) 4.0 - 10.5 K/uL   RBC 4.93 4.22 - 5.81 MIL/uL   Hemoglobin 14.9 13.0 - 17.0 g/dL   HCT 42.5 39 - 52 %   MCV 86.2 80.0 - 100.0 fL   MCH 30.2 26.0 - 34.0 pg   MCHC 35.1 30.0 - 36.0 g/dL   RDW 13.5 11.5 - 15.5 %   Platelets 421 (H) 150 - 400 K/uL   nRBC 0.0 0.0 - 0.2 %   Neutrophils Relative % 93 %   Neutro Abs 15.4 (H) 1.7 - 7.7 K/uL   Lymphocytes Relative 3 %   Lymphs Abs 0.4 (L) 0.7 - 4.0 K/uL   Monocytes Relative 4 %   Monocytes Absolute 0.7 0 - 1 K/uL   Eosinophils Relative 0 %   Eosinophils Absolute 0.0 0 - 0 K/uL   Basophils Relative 0 %   Basophils Absolute 0.0 0 - 0 K/uL   Immature Granulocytes 0 %   Abs Immature Granulocytes 0.07 0.00 - 0.07 K/uL    Comment: Performed at [facility], [place] [address], [place], [place] [address]  Protime-INR     Status: None   Collection Time: 09/29/19 12:58 PM  Result Value Ref Range   Prothrombin Time 12.0 11.4 - 15.2 seconds   INR 0.9 0.8 - 1.2    Comment: (NOTE) INR goal varies based on device and disease states. Performed at [facility], [place] [address], [place],  [address]   Urinalysis, Routine w reflex microscopic     Status: Abnormal   Collection Time: 09/29/19 12:58 PM  Result Value Ref Range   Color, Urine YELLOW YELLOW   APPearance CLOUDY (A) CLEAR   Specific Gravity, Urine 1.010 1.005 - 1.030   pH 6.0 5.0 - 8.0   Glucose, UA 50 (A) NEGATIVE mg/dL   Hgb urine dipstick LARGE (A) NEGATIVE   Bilirubin Urine NEGATIVE NEGATIVE   Ketones, ur NEGATIVE NEGATIVE mg/dL   Protein, ur 100 (A) NEGATIVE mg/dL   Nitrite  NEGATIVE NEGATIVE   Leukocytes,Ua LARGE (A) NEGATIVE   RBC / HPF >50 (H) 0 - 5 RBC/hpf   WBC, UA >50 (H) 0 - 5 WBC/hpf   Bacteria, UA RARE (A) NONE SEEN   WBC Clumps PRESENT     Comment: Performed at [facility], [place] [address] [place], [place] [address]  Troponin I (High Sensitivity)     Status: Abnormal   Collection Time: 09/29/19 12:58 PM  Result Value Ref Range   Troponin I (High Sensitivity) 74 (H) <18 ng/L    Comment: (NOTE) Elevated high sensitivity troponin I (hsTnI) values and significant  changes across serial measurements may suggest ACS but many other  chronic and acute conditions are known to elevate hsTnI results.  Refer to the Links section for chest pain algorithms and additional  guidance. Performed at [facility], [place] [address] [place], [place] [address]   Lipase, blood     Status: Abnormal  Collection Time: 09/29/19 12:58 PM  Result Value Ref Range   Lipase 323 (H) 11 - 51 U/L    Comment: Performed at [facility], [place] [address] [place], [place] [address]  Na and K (sodium & potassium), rand urine     Status: None   Collection Time: 09/29/19 12:58 PM  Result Value Ref Range   Sodium, Ur 93 mmol/L   Potassium Urine 18 mmol/L    Comment: Performed at [facility], [place] [address] [place], [place] [address]  Creatinine, urine, random     Status: None   Collection Time: 09/29/19 12:58 PM  Result Value Ref Range   Creatinine, Urine 47.49 mg/dL    Comment: Performed at [facility], [place] [address], [place], [place] [address]  SARS Coronavirus 2 by RT PCR (hospital order, performed in [facility] hospital lab) Nasopharyngeal Nasopharyngeal Swab     Status: None   Collection Time: 09/29/19  2:57 PM   Specimen: Nasopharyngeal Swab  Result Value Ref Range   SARS Coronavirus 2 NEGATIVE NEGATIVE    Comment: (NOTE) SARS-CoV-2 target nucleic acids are NOT  DETECTED.  The SARS-CoV-2 RNA is generally detectable in upper and lower respiratory specimens during the acute phase of infection. The lowest concentration of SARS-CoV-2 viral copies this assay can detect is 250 copies / mL. A negative result does not preclude SARS-CoV-2 infection and should not be used as the sole basis for treatment or other patient management decisions.  A negative result may occur with improper specimen collection / handling, submission of specimen other than nasopharyngeal swab, presence of viral mutation(s) within the areas targeted by this assay, and inadequate number of viral copies (<250 copies / mL). A negative result must be combined with clinical observations, patient history, and epidemiological information.  Fact Sheet for Patients:   StrictlyIdeas.no  Fact Sheet for Healthcare Providers: BankingDealers.co.za  This test is not yet approved or  cleared by the [place] FDA and has been authorized for detection and/or diagnosis of SARS-CoV-2 by FDA under an Emergency Use Authorization (EUA).  This EUA will remain in effect (meaning this test can be used) for the duration of the COVID-19 declaration under Section 564(b)(1) of the Act, 21 U.S.C. section 360bbb-3(b)(1), unless the authorization is terminated or revoked sooner.  Performed at [facility], [place] [address] [place], [place] [address]   Lactic acid, plasma     Status: None   Collection Time: 09/29/19  3:00 PM  Result Value Ref Range   Lactic Acid, Venous 1.0 0.5 - 1.9 mmol/L    Comment: Performed at [facility], [place] [address] [place], [place] [address]  Troponin I (High Sensitivity)     Status: Abnormal   Collection Time: 09/29/19  3:00 PM  Result Value Ref Range   Troponin I (High Sensitivity) 43 (H) <18 ng/L    Comment: DELTA CHECK NOTED (NOTE) Elevated high sensitivity troponin I (hsTnI) values  and significant  changes across serial measurements may suggest ACS but many other  chronic and acute conditions are known to elevate hsTnI results.  Refer to the Links section for chest pain algorithms and additional  guidance. Performed at [facility], [place] [address] [place], [place] [address]   Glucose, random     Status: Abnormal   Collection Time: 09/29/19  3:00 PM  Result Value Ref Range   Glucose, Bld 100 (H) 70 - 99 mg/dL  Comment: Glucose reference range applies only to samples taken after fasting for at least 8 hours. Performed at [facility], [place] [address] [place], [place] [address]   CBG monitoring, ED     Status: Abnormal   Collection Time: 09/29/19  3:12 PM  Result Value Ref Range   Glucose-Capillary 108 (H) 70 - 99 mg/dL    Comment: Glucose reference range applies only to samples taken after fasting for at least 8 hours.     ROS:  Review of systems not obtained due to patient factors.  Physical Exam: Vitals:   09/29/19 1719 09/29/19 1803  BP: 109/78   Pulse: (!) 104 (!) 102  Resp: 20 20  Temp:    SpO2: 100% 100%     General-  Thin-  Rigid-  Eyes open but unresponsive-  Wife at bedside HEENT-  PERRLA, EOMI nech- no JVD CV-  RRR Lungs-  Mostly clear Abd-  Thin-  Non tender-  bilat perc tubes now present Ext-  Min edema    Assessment/Plan: 86 year old male with bladder cancer and recurrent bladder obstruction issues-has exhausted options except for percutaneous nephrostomy tubes.  He has acute kidney injury and hyperkalemia complicating his obstruction today 1.Renal- likely mostly secondary to urinary obstruction.  Cannot rule out ATN with presentation of hypotension.  Also received contrast for his abdominal CT.   Appreciate interventional radiology being able to provide percutaneous nephrostomy tubes for patient at present as that will likely improve his acute kidney injury and his hyperkalemia quite a bit.   Have ordered another potassium level and will continue to attempt to treat medically until can improve on its own.  Given that he is DNR, wife had requested hospice referral I do not suspect he is a candidate for any hemodialysis in the interim.  Hopefully relief of the obstruction will improve his renal function.  Urine that was obtained is bloody but also WBCs.  Getting antibiotics per primary 2. Hypertension/volume  -hypotensive initially, this has improved-unable to determine volume status at this time.  Per ER was not overloaded.  Could continue to use IV fluids as needed 3. Anemia  -does not appear to be an issue.  Actually with hemoglobin close to 15 this argues for him being dry.  Feel free to use IV fluids as above 4.  Hyperkalemia- due to acute kidney injury due to bladder obstruction.  Will recheck potassium and attempt to treat medically for now.... Is not a candidate for dialysis, even short-term   Louis Meckel 09/29/2019, 6:20 PM

## 2019-09-29 NOTE — Sedation Documentation (Signed)
No sedation given.

## 2019-09-29 NOTE — ED Triage Notes (Signed)
Arrives [facility] from Surgery center. Pt was at the surgery center for reevaluation. Pt was scheduled to have stent placement 1 week ago. However pt had elevated creatinine and bladder inflammation. Per EMS: Pt presented to surgery center today with creatinine of greater than 3, AMS since Friday, generalized weakness since Friday. Pt from home with home health aide. Pt was found hypotensive with EMS.  BP: 86/68 HR:105 SPO2:95% RA,  RR: 22

## 2019-09-29 NOTE — ED Notes (Signed)
Pt remains in IR

## 2019-09-29 NOTE — H&P (Signed)
History and Physical  Benjamin Cardenas AQT:[id number] DOB: 1929/10/16 DOA: 09/29/2019  Referring physician: Aletta Edouard, MD PCP: Isaac Bliss, Rayford Halsted, MD  Outpatient Specialists: Dr Karsten Ro ( Urology) Patient coming from: Home  Chief Complaint: Worsening confusion  HPI: Benjamin Cardenas is a 86 y.o. male with medical history significant for bladder cancer with multiple episodes of urinary retention with known bilateral hydronephrosis despite TURP (11/07/2018) and swelling Foley catheter (09/20/2018) and unsuccessful attempt stent placement on 08/28/2019 by Dr. Karsten Ro due to inflamed bladder.  He was discharged with plan to ast hospitalization who presented on 09/29/2019 with reports of worsening confusion, decreased urination for the past week as reported by wife.  Patient is unable to provide any history sees only alert to self.   Aggressively managed bladder inflammation to prevent further obstruction and potentially avoid bilateral nephrostomy tube placement he was seen by his PCP on 09/19/2019 was in his usual state of health at that time with only complaint by wife being 15 pound weight loss over the course of 8 months.  His wife stated over the past 4 days he has had decreased ability to control his urine, inability to walk on his own, requiring more assistance from home nursing.  Given decline in his care she asked for a referral to hospice from her PCP.  Per chart review patient presented to surgery Center today with elevated creatinine of 3 with confusion and generalized weakness.  In the ED he was found to have a team max 96.6, tachycardic to low 100s, initial blood pressure 127/91 but they found to be 83/51, this improved 29/78 with fluids. Lab work notable for lactic acid of 1, initial troponin of 74 before de-escalating to 43 SARS PCR negative  Sodium 122, potassium 8, chloride 89, CO2 16, BUN 228, creatinine 6.29 (baseline 1.7), calcium 10.4, lactic acid within normal  limits WBC 16.6.  Blood cultures were obtained. UA showed large hemoglobin with large leukocytes, greater than 50 WBCs and rare bacteria.  Lipase was noted to be 323.    Patient underwent CT renal stone study which showed chronic bilateral hydronephrosis left greater than right.  An unremarkable chest x-ray.  Patient received calcium gluconate and insulin for hyperkalemia and peaked T waves on EKG. Patient started on cefepime after blood cultures were obtained Patient was given 1 L normal saline bolus ED consulted IR, nephrology, interventional radiology.  [facility] service was called for further management and evaluation. Review of Systems:As mentioned in the history of present illness.Review of systems are otherwise negative Patient seen in the ED.   Past Medical History:  Diagnosis Date  . BPH with urinary obstruction   . Chronic constipation   . Chronic kidney disease    hydtronephrosis  . Chronic low back pain   . Cognitive impairment   . Dry skin dermatitis   . Elevated PSA   . Foley catheter in place    July 14, 2019  . History of bladder cancer    urologist--- dr Consuella Lose  . History of colon polyps   . History of sepsis    previous admission's for sepsis due to UTI's and CAP:   last admission's 05/ 2020 and 06/ 2020 sepsis secondary to UTI  . History of supraventricular tachycardia 11-03-2018  pt denies any symptoms since 2018   03/ 2018 during admission for CAP with sepsis-- episode of SVT;  pt cardiac evaluation after discharge 08-17-2016 (note in epic, b. bhagat PA),  normal echo,  event montior no sig.  arrrhythmias heart rate range 36-120  . IDA (iron deficiency anemia)   . Lower urinary tract symptoms (LUTS)   . Lumbar stenosis   . OA (osteoarthritis)   . Prostate nodule   . Seasonal allergic rhinitis   . Urinary retention   . Wears glasses    Past Surgical History:  Procedure Laterality Date  . CATARACT EXTRACTION W/ INTRAOCULAR LENS  IMPLANT,  BILATERAL  09/2017  . CYSTOSCOPY WITH BIOPSY N/A 04/22/2015   Procedure: CYSTOSCOPY WITH BLADDER BIOPSY;  Surgeon: Kathie Rhodes, MD;  Location: [facility];  Service: Urology;  Laterality: N/A;  . CYSTOSCOPY WITH RETROGRADE PYELOGRAM, URETEROSCOPY AND STENT PLACEMENT Left 02/25/2015   Procedure: LEFT RETROGRADE PYELOGRAM, URETEROSCOPY ;  Surgeon: Kathie Rhodes, MD;  [facility]: [facility];  Service: Urology;  Laterality: Left;  . CYSTOSCOPY WITH URETEROSCOPY AND STENT PLACEMENT N/A 08/28/2019   Procedure: CYSTOSCOPY;  Surgeon: Kathie Rhodes, MD;  Location: [facility];  Service: Urology;  Laterality: N/A;  . HERNIA REPAIR  age 44 [id number]   rupture right inguinal hernia  . INGUINAL HERNIA REPAIR  03/02/2011   Procedure: HERNIA REPAIR INGUINAL ADULT;  Surgeon: Earnstine Regal, MD;  Location: [facility];  Service: General;  Laterality: Left;  with mesh   . TRANSURETHRAL RESECTION OF BLADDER TUMOR WITH GYRUS (TURBT-GYRUS) N/A 02/25/2015   Procedure: TRANSURETHRAL RESECTION OF BLADDER TUMOR WITH GYRUS (TURBT-GYRUS);  Surgeon: Kathie Rhodes, MD;  Location: [facility];  Service: Urology;  Laterality: N/A;  . TRANSURETHRAL RESECTION OF PROSTATE N/A 11/07/2018   Procedure: TRANSURETHRAL RESECTION OF THE PROSTATE (TURP)/ BLADDER BIOPSY;  Surgeon: Kathie Rhodes, MD;  Location: [facility];  Service: Urology;  Laterality: N/A;   Allergies  Allergen Reactions  . Amoxicillin Rash    Has patient had a PCN reaction causing immediate rash, facial/tongue/throat swelling, SOB or lightheadedness with hypotension:No Has patient had a PCN reaction causing severe rash involving mucus membranes or skin necrosis:unsure Has patient had a PCN reaction that required hospitalization:No Has patient had a PCN reaction occurring within the last 10 years:unsure If all of the above answers are "NO", then may proceed with Cephalosporin use.   . Ciprofloxacin Swelling  and Rash  . Sulfasalazine Swelling  . Sulfa Antibiotics Swelling  . Doxycycline Hyclate Rash  . Levofloxacin Rash   Social History:  reports that he quit smoking about 40 years ago. His smoking use included cigarettes. He has a 51.00 pack-year smoking history. He has never used smokeless tobacco. He reports previous alcohol use. He reports that he does not use drugs. Family History  Problem Relation Age of Onset  . Heart disease Mother   . Heart disease Father   . Cancer Sister        breast  . Cancer Brother        colon      Prior to Admission medications   Medication Sig Start Date End Date Taking? Authorizing Provider  cetirizine (ZYRTEC) 10 MG tablet Take 10 mg by mouth at bedtime.    Yes [provider]  Cyanocobalamin (VITAMIN B 12 PO) Take 1 tablet by mouth daily.   Yes [provider]  naphazoline-pheniramine (NAPHCON-A) 0.025-0.3 % ophthalmic solution Place 1 drop into both eyes 4 (four) times daily as needed for eye irritation.    Yes [provider]  polyethylene glycol (MIRALAX / GLYCOLAX) 17 g packet Take 17 g by mouth daily as needed for mild constipation.   Yes  [provider]  TRULANCE 3 MG TABS Take 3 mg by mouth every morning.  10/29/15  Yes [provider]  vitamin B-12 1000 MCG tablet Take 1 tablet (1,000 mcg total) by mouth daily. Patient not taking: Reported on 09/29/2019 09/24/18   Kerney Elbe, DO    Physical Exam: BP 109/78   Pulse (!) 102   Temp (!) 96.6 F (35.9 C) (Rectal) Comment: Warm blankets applied at this time .  Resp 20   SpO2 100%   Constitutional frail elderly male, opening eyes to voice, in obvious discomfort Eyes: EOMI, anicteric, normal conjunctivae ENMT: Dry oral mucosa Cardiovascular: Tachycardic, with no peripheral edema Respiratory: Normal respiratory effort on room air,   Abdomen: Soft,non-tender, no guarding or rebound tenderness Skin: No rash ulcers, or lesions. Without skin  tenting  Neurologic: Alert only to self, able to intermittently follow simple commands, moving all extremities on his own          Labs on Admission:  Basic Metabolic Panel: Recent Labs  Lab 09/29/19 1258 09/29/19 1500  NA 122*  --   K 8.0*  --   CL 89*  --   CO2 16*  --   GLUCOSE 150* 100*  BUN 228*  --   CREATININE 6.29*  --   CALCIUM 10.4*  --    Liver Function Tests: Recent Labs  Lab 09/29/19 1258  AST 32  ALT 31  ALKPHOS 109  BILITOT 0.7  PROT 9.4*  ALBUMIN 5.2*   Recent Labs  Lab 09/29/19 1258  LIPASE 323*   No results for input(s): AMMONIA in the last 168 hours. CBC: Recent Labs  Lab 09/29/19 1258  WBC 16.6*  NEUTROABS 15.4*  HGB 14.9  HCT 42.5  MCV 86.2  PLT 421*   Cardiac Enzymes: No results for input(s): CKTOTAL, CKMB, CKMBINDEX, TROPONINI in the last 168 hours.  BNP (last 3 results) No results for input(s): BNP in the last 8760 hours.  ProBNP (last 3 results) No results for input(s): PROBNP in the last 8760 hours.  CBG: Recent Labs  Lab 09/29/19 1512  GLUCAP 108*    Radiological Exams on Admission: DG Chest Port 1 View  Result Date: 09/29/2019 CLINICAL DATA:  Cough, weakness EXAM: PORTABLE CHEST 1 VIEW COMPARISON:  08/13/2018 FINDINGS: The heart size and mediastinal contours are within normal limits. Atherosclerotic calcification of the aortic knob. Both lungs are clear. The visualized skeletal structures are unremarkable. IMPRESSION: No active disease. Electronically Signed   By: Davina Poke D.O.   On: 09/29/2019 13:38   CT Renal Stone Study  Result Date: 09/29/2019 CLINICAL DATA:  86 year old male with history of acute renal failure. EXAM: CT ABDOMEN AND PELVIS WITHOUT CONTRAST TECHNIQUE: Multidetector CT imaging of the abdomen and pelvis was performed following the standard protocol without IV contrast. COMPARISON:  CT the abdomen and pelvis 08/04/2019. FINDINGS: Lower chest: Atherosclerotic calcifications in the descending  thoracic aorta as well as the left circumflex and right coronary arteries. Hepatobiliary: No definite suspicious cystic or solid hepatic lesions are confidently identified on today's noncontrast CT examination. Amorphous high attenuation material lying dependently in the gallbladder, likely to reflect biliary sludge and/or tiny noncalcified gallstones. Gallbladder is nearly completely decompressed without surrounding inflammatory changes. Pancreas: No definite pancreatic mass or peripancreatic fluid collections or inflammatory changes are noted on today's noncontrast CT examination. Spleen: Unremarkable. Adrenals/Urinary Tract: No calcifications are identified within the collecting system of either kidney, along the course of either ureter, or within the lumen  of the urinary bladder. Mild right renal atrophy. Moderate right and severe left-sided hydroureteronephrosis, similar to the prior examination. 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 Small amount of gas non dependently in the urinary bladder. Urinary bladder is otherwise unremarkable in appearance. Previously noted Foley balloon catheter has been removed. Stomach/Bowel: Unenhanced appearance of the stomach is normal. No pathologic dilatation of small bowel or colon. The appendix is not confidently identified and may be surgically absent. Regardless, there are no inflammatory changes noted adjacent to the cecum to suggest the presence of an acute appendicitis at this time. Vascular/Lymphatic: Aortic atherosclerosis with mild aneurysmal dilatation of the infrarenal abdominal aorta which measures up to 2.9 x 3.1 cm (mean diameter of 3 cm). No lymphadenopathy noted in the abdomen or pelvis. Reproductive: Postoperative changes of TURP are noted in the prostate gland. Seminal vesicles are unremarkable in appearance. Other: No significant volume of ascites.  No pneumoperitoneum. Musculoskeletal: There are no aggressive  appearing lytic or blastic lesions noted in the visualized portions of the skeleton. IMPRESSION: 1. Chronic bilateral hydroureteronephrosis (left greater than right), very similar to the prior examination. No obstructing stone identified. The possibility of distal ureteral stricture should be considered at or near the ureterovesicular junctions. 2. Mild right renal atrophy. 3. Small amount of gas non dependently in the lumen of the urinary bladder. This could be iatrogenic if there has been recent catheterization. Alternatively, correlation with urinalysis is recommended to exclude the possibility of urinary tract infection with gas-forming organisms. 4. Biliary sludge and/or tiny gallstones lying dependently in the gallbladder, without findings to suggest an acute cholecystitis at this time. 5. Aortic atherosclerosis with mild aneurysmal dilatation of the infrarenal abdominal aorta (3 cm in diameter). Recommend followup by ultrasound in 3 years. This recommendation follows ACR consensus guidelines: White Paper of the ACR Incidental Findings Committee II on Vascular Findings. J Am Coll Radiol 2013; 10:789-794. Electronically Signed   By: Vinnie Langton M.D.   On: 09/29/2019 16:12    Assessment/Plan Present on Admission: . Toxic metabolic encephalopathy . AKI (acute kidney injury) ([place]) . Altered mental status . Bladder cancer ([place]) . Hyponatremia . Hyperkalemia . Sepsis ([place]) . Hypercalcemia . Metabolic acidosis . Elevated troponin  Active Problems:   Hyponatremia   Altered mental status   Bladder cancer ([facility])   AKI (acute kidney injury) ([place])   Hyperkalemia   Toxic metabolic encephalopathy   Sepsis ([facility])   Hypercalcemia   Metabolic acidosis   Elevated troponin   Sepsis presumed secondary to likely UTI Patient presented with leukocytosis, tachycardia, hypotension, concerning UA and urinary symptoms.  All in setting of known bladder cancer and associated bladder inflammation causing  obstruction.  Is very possible these parameters could be altered in the setting of his severe obstruction. -continue IV cefepime while monitoring blood and urine cultures -There is no lactic acidosis, has remained afebrile  Chronic bilateral hydronephrosis, status post bilateral percutaneous nephrostomy tubes by IR on 6/25 In the setting of bladder cancer. Followed by urology as outpatient.  Failed prior stent placement on 08/2019.  Suspect worsening confusion, AKI, electrolyte derangements all results of obstruction related to bladder inflammation hopefully this is to be improved now that nephrostomy tubes are in place -Monitor urine output and nephrostomy tube output  AKI with significant hyperkalemia and metabolic acidosis with peaked T waves in the setting of obstruction related to bladder cancer.  Other differential diagnoses include ATN from hypotension.  Patient is now  status post nephrostomy tube and expect some improvement in renal function.  Creatinine on admission was 6.29 (baseline 1.7) as well as BUN of 228, likely combination of obstruction, ATN from hypotension as well as hypovolemia -Appreciate nephrology recommendations, currently not a candidate for dialysis even short-term -Status post calcium gluconate, insulin -Repeat BMP in a.m. -Avoid nephrotoxins, follow urine output and nephrostomy tubes -IV fluids x24 hours and reassess  Toxic metabolic encephalopathy, multifactorial etiology Including acute renal failure, hyperkalemia, hyponatremia, hypotension, possible infection/sepsis.  Currently confused only alert to self, baseline is alert and oriented x4 per wife.  Is an acute change in his mentation is likely being driven by worsening obstruction -Continue to monitor mentation after nephrostomy tube placement and hopeful correction of/improvement of creatinine, sodium -Continue IV fluids -Avoid sedatives -Return precautions  Hyponatremia, suspect hypovolemic.  Patient looks  volume down on exam.  Seems likely given patient has had worsening confusion likely in setting of poor renal function related to bladder obstruction as well as possible infection. -Continue IV fluids at 100 cc/h and reassess in 24 hours -Repeat BMP in a.m., avoid overcorrection greater than 10 mEq in 24 hours -Urine sodium, serum and urine osmolality  Hypotension, likely hypovolemic.  Also some concern for possible infection given elevated white count and UA findings -Follow urine culture and blood cultures -Continue IV cefepime -Continue IV fluids  Hypercalcemia, borderline, mild.  In the setting of elevated alimented/related to severe dehydration due to above and resultant increase in calcium binding to albumin -We will repeat CMP in a.m. -Continue IV fluids for now  Elevated troponin, likely demand ischemia, resolved High-sensitivity troponin of 73 that is already down trended to 43.  No ischemic changes on EKG.  Likely all being driven by hydronephrosis with adductor image putting much stress on patient's body  Elevated lipase. Lipase elevated in the 300s Unclear clinical relevance.  No overt abdominal pain.  CT abdomen shows no concerning findings on pancreas -Continue to closely monitor  Goals of care discussion Patient's wife Fraser Din recently been considering hospice referral due to his worsening clinical status and guarded prognosis prior to hospitalization.  She would like to see how he does in terms of his mentation and laboratory findings after nephrostomy tube placement.  She is agreeable to palliative care consultation -Palliative care consulted -Wife confirms patient is DNR     DVT prophylaxis:  Heparin subcut  Code Status: DNR   Family Communication: Wife updated at bedside  Disposition Plan: Expect greater than 2 midnight stay inpatient with guarded prognosis given multiple electrolyte derangements most notably severe hyperkalemia, acute renal failure in setting of  bladder obstruction, concern for potential infection, hypotension, hyponatremia.  Consults called: Nephrology, IR, urology, palliative care  Admission status: Admitted as inpatient to stepdown unit      Desiree Hane MD [facility]  Pager [phone]  If 7PM-7AM, please contact night-coverage [email or web address] Password [facility]  09/29/2019, 6:38 PM

## 2019-09-29 NOTE — Consult Note (Signed)
Chief Complaint: Patient was seen in consultation today for bilateral hydronephrosis  Referring Physician(s): Dr. Link Snuffer  Supervising Physician: Sandi Mariscal  Patient Status: [facility] - ED  History of Present Illness: Benjamin Cardenas is a 86 y.o. male with history of BPH, chronic kidney disease, and bladder cancer followed by Dr. Karsten Ro for persistent hydronephrosis. Patient presented to the [facility] ED today with hypotension and worsening creatinine.   CT Renal Stone Study 09/29/19: 1. Chronic bilateral hydroureteronephrosis (left greater than right), very similar to the prior examination. No obstructing stone identified. The possibility of distal ureteral stricture should be considered at or near the ureterovesicular junctions. 2. Mild right renal atrophy. 3. Small amount of gas non dependently in the lumen of the urinary bladder. This could be iatrogenic if there has been recent catheterization. Alternatively, correlation with urinalysis is recommended to exclude the possibility of urinary tract infection with gas-forming organisms. 4. Biliary sludge and/or tiny gallstones lying dependently in the gallbladder, without findings to suggest an acute cholecystitis at this time.  IR consulted for bilateral percutaneous nephrostomy tube placement at the request of Dr. Gloriann Loan.   Patient assessed in the ED.  He is alert, not oriented, agitated.  Wife at bedside provides history.  Patient being admitted.   Past Medical History:  Diagnosis Date   BPH with urinary obstruction    Chronic constipation    Chronic kidney disease    hydtronephrosis   Chronic low back pain    Cognitive impairment    Dry skin dermatitis    Elevated PSA    Foley catheter in place    July 14, 2019   History of bladder cancer    urologist--- dr Consuella Lose   History of colon polyps    History of sepsis    previous admission's for sepsis due to UTI's and CAP:   last admission's 05/ 2020 and 06/  2020 sepsis secondary to UTI   History of supraventricular tachycardia 11-03-2018  pt denies any symptoms since 2018   03/ 2018 during admission for CAP with sepsis-- episode of SVT;  pt cardiac evaluation after discharge 08-17-2016 (note in epic, b. bhagat PA),  normal echo,  event montior no sig. arrrhythmias heart rate range 36-120   IDA (iron deficiency anemia)    Lower urinary tract symptoms (LUTS)    Lumbar stenosis    OA (osteoarthritis)    Prostate nodule    Seasonal allergic rhinitis    Urinary retention    Wears glasses     Past Surgical History:  Procedure Laterality Date   CATARACT EXTRACTION W/ INTRAOCULAR LENS  IMPLANT, BILATERAL  09/2017   CYSTOSCOPY WITH BIOPSY N/A 04/22/2015   Procedure: CYSTOSCOPY WITH BLADDER BIOPSY;  Surgeon: Kathie Rhodes, MD;  Location: [place];  Service: Urology;  Laterality: N/A;   CYSTOSCOPY WITH RETROGRADE PYELOGRAM, URETEROSCOPY AND STENT PLACEMENT Left 02/25/2015   Procedure: LEFT RETROGRADE PYELOGRAM, URETEROSCOPY ;  Surgeon: Kathie Rhodes, MD;  Location: [facility];  Service: Urology;  Laterality: Left;   CYSTOSCOPY WITH URETEROSCOPY AND STENT PLACEMENT N/A 08/28/2019   Procedure: CYSTOSCOPY;  Surgeon: Kathie Rhodes, MD;  Location: [facility];  Service: Urology;  Laterality: N/A;   HERNIA REPAIR  age 25 1937   rupture right inguinal hernia   INGUINAL HERNIA REPAIR  03/02/2011   Procedure: HERNIA REPAIR INGUINAL ADULT;  Surgeon: Earnstine Regal, MD;  Location: [facility];  Service: General;  Laterality: Left;  with mesh  TRANSURETHRAL RESECTION OF BLADDER TUMOR WITH GYRUS (TURBT-GYRUS) N/A 02/25/2015   Procedure: TRANSURETHRAL RESECTION OF BLADDER TUMOR WITH GYRUS (TURBT-GYRUS);  Surgeon: Kathie Rhodes, MD;  Location: [facility];  Service: Urology;  Laterality: N/A;   TRANSURETHRAL RESECTION OF PROSTATE N/A 11/07/2018   Procedure: TRANSURETHRAL RESECTION OF THE PROSTATE  (TURP)/ BLADDER BIOPSY;  Surgeon: Kathie Rhodes, MD;  Location: [place];  Service: Urology;  Laterality: N/A;    Allergies: Amoxicillin, Ciprofloxacin, Sulfasalazine, Sulfa antibiotics, Doxycycline hyclate, and Levofloxacin  Medications: Prior to Admission medications   Medication Sig Start Date End Date Taking? Authorizing Provider  aspirin EC 81 MG tablet Take 81 mg by mouth daily.     [provider]  cetirizine (ZYRTEC) 10 MG tablet Take 10 mg by mouth at bedtime.     [provider]  naphazoline-pheniramine (NAPHCON-A) 0.025-0.3 % ophthalmic solution Place 1 drop into both eyes 4 (four) times daily as needed for eye irritation.     [provider]  TRULANCE 3 MG TABS Take 3 mg by mouth every morning.  10/29/15   [provider]  vitamin B-12 1000 MCG tablet Take 1 tablet (1,000 mcg total) by mouth daily. 09/24/18   Kerney Elbe, DO     Family History  Problem Relation Age of Onset   Heart disease Mother    Heart disease Father    Cancer Sister        breast   Cancer Brother        colon    Social History   Socioeconomic History   Marital status: Married    Spouse name: Not on file   Number of children: Not on file   Years of education: Not on file   Highest education level: Not on file  Occupational History   Not on file  Tobacco Use   Smoking status: Former Smoker    Packs/day: 1.50    Years: 34.00    Pack years: 51.00    Types: Cigarettes    Quit date: 02/17/1979    Years since quitting: 40.6   Smokeless tobacco: Never Used  Scientific laboratory technician Use: Never used  Substance and Sexual Activity   Alcohol use: Not Currently   Drug use: No   Sexual activity: Not on file  Other Topics Concern   Not on file  Social History Narrative   Not on file   Social Determinants of Health   Financial Resource Strain:    Difficulty of Paying Living Expenses:   Food Insecurity:    Worried  About Charity fundraiser in the Last Year:    Arboriculturist in the Last Year:   Transportation Needs:    Film/video editor (Medical):    Lack of Transportation (Non-Medical):   Physical Activity:    Days of Exercise per Week:    Minutes of Exercise per Session:   Stress:    Feeling of Stress :   Social Connections:    Frequency of Communication with Friends and Family:    Frequency of Social Gatherings with Friends and Family:    Attends Religious Services:    Active Member of Clubs or Organizations:    Attends Archivist Meetings:    Marital Status:      Review of Systems: A 12 point ROS discussed and pertinent positives are indicated in the HPI above.  All other systems are negative.  Review of Systems  Unable to perform  ROS: Acuity of condition    Vital Signs: BP (!) 89/76    Pulse 99    Temp (!) 96.6 F (35.9 C) (Rectal) Comment: Warm blankets applied at this time .   Resp 17    SpO2 100%   Physical Exam Vitals and nursing note reviewed.  Constitutional:      Appearance: He is ill-appearing.     Comments: Appears in discomfort.  HENT:     Mouth/Throat:     Mouth: Mucous membranes are moist.     Pharynx: Oropharynx is clear.  Cardiovascular:     Rate and Rhythm: Regular rhythm. Tachycardia present.  Pulmonary:     Effort: Pulmonary effort is normal. No respiratory distress.     Breath sounds: Normal breath sounds.  Abdominal:     Palpations: Abdomen is soft.  Genitourinary:    Comments: Difficulty vs. Painful urination, appears agitated when attempting to void Skin:    General: Skin is warm and dry.  Neurological:     General: No focal deficit present.     Mental Status: He is alert and oriented to person, place, and time. Mental status is at baseline.      MD Evaluation Airway: WNL Heart: WNL Abdomen: WNL Chest/ Lungs: WNL ASA  Classification: 3 Mallampati/Airway Score: One   Imaging: DG Chest Port 1 View  Result  Date: 09/29/2019 CLINICAL DATA:  Cough, weakness EXAM: PORTABLE CHEST 1 VIEW COMPARISON:  08/13/2018 FINDINGS: The heart size and mediastinal contours are within normal limits. Atherosclerotic calcification of the aortic knob. Both lungs are clear. The visualized skeletal structures are unremarkable. IMPRESSION: No active disease. Electronically Signed   By: Davina Poke D.O.   On: 09/29/2019 13:38   CT Renal Stone Study  Result Date: 09/29/2019 CLINICAL DATA:  86 year old male with history of acute renal failure. EXAM: CT ABDOMEN AND PELVIS WITHOUT CONTRAST TECHNIQUE: Multidetector CT imaging of the abdomen and pelvis was performed following the standard protocol without IV contrast. COMPARISON:  CT the abdomen and pelvis 08/04/2019. FINDINGS: Lower chest: Atherosclerotic calcifications in the descending thoracic aorta as well as the left circumflex and right coronary arteries. Hepatobiliary: No definite suspicious cystic or solid hepatic lesions are confidently identified on today's noncontrast CT examination. Amorphous high attenuation material lying dependently in the gallbladder, likely to reflect biliary sludge and/or tiny noncalcified gallstones. Gallbladder is nearly completely decompressed without surrounding inflammatory changes. Pancreas: No definite pancreatic mass or peripancreatic fluid collections or inflammatory changes are noted on today's noncontrast CT examination. Spleen: Unremarkable. Adrenals/Urinary Tract: No calcifications are identified within the collecting system of either kidney, along the course of either ureter, or within the lumen of the urinary bladder. Mild right renal atrophy. Moderate right and severe left-sided hydroureteronephrosis, similar to the prior examination. 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 Small amount of gas non dependently in the urinary bladder. Urinary bladder is otherwise unremarkable in appearance.  Previously noted Foley balloon catheter has been removed. Stomach/Bowel: Unenhanced appearance of the stomach is normal. No pathologic dilatation of small bowel or colon. The appendix is not confidently identified and may be surgically absent. Regardless, there are no inflammatory changes noted adjacent to the cecum to suggest the presence of an acute appendicitis at this time. Vascular/Lymphatic: Aortic atherosclerosis with mild aneurysmal dilatation of the infrarenal abdominal aorta which measures up to 2.9 x 3.1 cm (mean diameter of 3 cm). No lymphadenopathy noted in the abdomen or pelvis. Reproductive: Postoperative changes  of TURP are noted in the prostate gland. Seminal vesicles are unremarkable in appearance. Other: No significant volume of ascites.  No pneumoperitoneum. Musculoskeletal: There are no aggressive appearing lytic or blastic lesions noted in the visualized portions of the skeleton. IMPRESSION: 1. Chronic bilateral hydroureteronephrosis (left greater than right), very similar to the prior examination. No obstructing stone identified. The possibility of distal ureteral stricture should be considered at or near the ureterovesicular junctions. 2. Mild right renal atrophy. 3. Small amount of gas non dependently in the lumen of the urinary bladder. This could be iatrogenic if there has been recent catheterization. Alternatively, correlation with urinalysis is recommended to exclude the possibility of urinary tract infection with gas-forming organisms. 4. Biliary sludge and/or tiny gallstones lying dependently in the gallbladder, without findings to suggest an acute cholecystitis at this time. 5. Aortic atherosclerosis with mild aneurysmal dilatation of the infrarenal abdominal aorta (3 cm in diameter). Recommend followup by ultrasound in 3 years. This recommendation follows ACR consensus guidelines: White Paper of the ACR Incidental Findings Committee II on Vascular Findings. J Am Coll Radiol 2013;  10:789-794. Electronically Signed   By: Vinnie Langton M.D.   On: 09/29/2019 16:12    Labs:  CBC: Recent Labs    01/27/19 1111 01/27/19 1111 07/14/19 2114 08/04/19 1400 08/28/19 0741 09/29/19 1258  WBC 7.4  --  9.1 11.4*  --  16.6*  HGB 11.9*   < > 12.4* 11.6* 10.9* 14.9  HCT 36.5*   < > 36.6* 35.9* 32.0* 42.5  PLT 247.0  --  268 220  --  421*   < > = values in this interval not displayed.    COAGS: Recent Labs    09/29/19 1258  INR 0.9    BMP: Recent Labs    07/11/19 1343 07/11/19 1343 07/14/19 2114 07/14/19 2114 08/04/19 1400 08/28/19 0741 09/29/19 1258 09/29/19 1500  NA 133*   < > 130*  --  140 140 122*  --   K 5.0   < > 4.7  --  3.6 4.3 8.0*  --   CL 97   < > 97*  --  106 105 89*  --   CO2 27  --  22  --  24  --  16*  --   GLUCOSE 92   < > 104*   < > 109* 103* 150* 100*  BUN 50*   < > 59*  --  33* 27* 228*  --   CALCIUM 9.3  --  9.4  --  8.9  --  10.4*  --   CREATININE 2.27*   < > 2.55*  --  1.79* 1.70* 6.29*  --   GFRNONAA  --   --  22*  --  33*  --  7*  --   GFRAA  --   --  25*  --  39*  --  8*  --    < > = values in this interval not displayed.    LIVER FUNCTION TESTS: Recent Labs    01/27/19 1111 05/03/19 1052 08/04/19 1400 09/29/19 1258  BILITOT 0.7 0.5 1.0 0.7  AST 16 19 20  32  ALT 10 12 15 31   ALKPHOS 92 100 74 109  PROT 6.8 6.9 7.0 9.4*  ALBUMIN 4.2 4.2 3.8 5.2*    TUMOR MARKERS: No results for input(s): AFPTM, CEA, CA199, CHROMGRNA in the last 8760 hours.  Assessment and Plan: Bilateral hydronephrosis Patient with chronic hydronephrosis, now with worsening and evidence of  renal failure.  IR consulted for bilateral nephrostomy tube placements.  Patient assessed in the ED.  He is intermittently alert, and agitated. Appears in distress/pain when attempting to urinate.   Procedure discussed with wife at bedside.  Of note, his potassium is 8.0-- s/p insulin, Ca gluconate, and D50 administration.  SCr elevated to 6.9  After  discussion, wife is agreeable to proceed with bilateral nephrostomy tube placement in IR today.   Risks and benefits of bilateral PCN placement was discussed with the patient including, but not limited to, infection, bleeding, significant bleeding causing loss or decrease in renal function or damage to adjacent structures.   All of the patient's questions were answered, patient is agreeable to proceed.  Consent signed and in chart.  Thank you for this interesting consult.  I greatly enjoyed meeting Benjamin Cardenas and look forward to participating in their care.  A copy of this report was sent to the requesting provider on this date.  Electronically Signed: Docia Barrier, PA 09/29/2019, 4:55 PM   I spent a total of 40 Minutes    in face to face in clinical consultation, greater than 50% of which was counseling/coordinating care for bilateral hydronephrosis.

## 2019-09-29 NOTE — ED Notes (Signed)
Mittens placed on pt at this time due to pt pulling at lines.

## 2019-09-29 NOTE — ED Notes (Signed)
Attempted to call report. RN unavailable. Will call again.

## 2019-09-29 NOTE — Progress Notes (Signed)
Notified bedside nurse of need to administer fluid bolus.  Message sent to bedside RN to ensure full fluid bolus ordered was given to patient. Currently the patient is in IR for a procedure and RN will administer the remaining fluid bolus if the patient returns.

## 2019-09-29 NOTE — ED Notes (Signed)
Pt transported to IR at this time. Pt placed on monitor in IR. PA in IR aware of pts arrival and at bedside.

## 2019-09-30 ENCOUNTER — Other Ambulatory Visit: Payer: Self-pay

## 2019-09-30 DIAGNOSIS — Z515 Encounter for palliative care: Secondary | ICD-10-CM

## 2019-09-30 DIAGNOSIS — R410 Disorientation, unspecified: Secondary | ICD-10-CM

## 2019-09-30 DIAGNOSIS — Z7189 Other specified counseling: Secondary | ICD-10-CM

## 2019-09-30 DIAGNOSIS — R531 Weakness: Secondary | ICD-10-CM

## 2019-09-30 LAB — BASIC METABOLIC PANEL
Anion gap: 11 (ref 5–15)
BUN: 154 mg/dL — ABNORMAL HIGH (ref 8–23)
CO2: 16 mmol/L — ABNORMAL LOW (ref 22–32)
Calcium: 9.7 mg/dL (ref 8.9–10.3)
Chloride: 108 mmol/L (ref 98–111)
Creatinine, Ser: 3.84 mg/dL — ABNORMAL HIGH (ref 0.61–1.24)
Glucose, Bld: 147 mg/dL — ABNORMAL HIGH (ref 70–99)
Potassium: 5.8 mmol/L — ABNORMAL HIGH (ref 3.5–5.1)
Sodium: 135 mmol/L (ref 135–145)

## 2019-09-30 LAB — CBC
HCT: 41.1 % (ref 39.0–52.0)
Hemoglobin: 14.3 g/dL (ref 13.0–17.0)
MCH: 30.4 pg (ref 26.0–34.0)
MCHC: 34.8 g/dL (ref 30.0–36.0)
MCV: 87.3 fL (ref 80.0–100.0)
Platelets: 371 10*3/uL (ref 150–400)
RBC: 4.71 MIL/uL (ref 4.22–5.81)
RDW: 13.5 % (ref 11.5–15.5)
WBC: 15.4 10*3/uL — ABNORMAL HIGH (ref 4.0–10.5)
nRBC: 0 % (ref 0.0–0.2)

## 2019-09-30 LAB — COMPREHENSIVE METABOLIC PANEL
ALT: 29 U/L (ref 0–44)
AST: 32 U/L (ref 15–41)
Albumin: 4.3 g/dL (ref 3.5–5.0)
Alkaline Phosphatase: 92 U/L (ref 38–126)
Anion gap: 14 (ref 5–15)
BUN: 217 mg/dL — ABNORMAL HIGH (ref 8–23)
CO2: 13 mmol/L — ABNORMAL LOW (ref 22–32)
Calcium: 9.6 mg/dL (ref 8.9–10.3)
Chloride: 97 mmol/L — ABNORMAL LOW (ref 98–111)
Creatinine, Ser: 4.99 mg/dL — ABNORMAL HIGH (ref 0.61–1.24)
Glucose, Bld: 113 mg/dL — ABNORMAL HIGH (ref 70–99)
Potassium: 7 mmol/L (ref 3.5–5.1)
Sodium: 124 mmol/L — ABNORMAL LOW (ref 135–145)
Total Bilirubin: 0.9 mg/dL (ref 0.3–1.2)
Total Protein: 8 g/dL (ref 6.5–8.1)

## 2019-09-30 LAB — POTASSIUM
Potassium: 5.7 mmol/L — ABNORMAL HIGH (ref 3.5–5.1)
Potassium: 5.2 mmol/L — ABNORMAL HIGH (ref 3.5–5.1)

## 2019-09-30 LAB — GLUCOSE, CAPILLARY: Glucose-Capillary: 110 mg/dL — ABNORMAL HIGH (ref 70–99)

## 2019-09-30 LAB — URINE CULTURE: Culture: NO GROWTH

## 2019-09-30 MED ORDER — SODIUM BICARBONATE-DEXTROSE 150-5 MEQ/L-% IV SOLN
150.0000 meq | INTRAVENOUS | Status: DC
  Administered 2019-09-30 (×2): 150 meq via INTRAVENOUS
  Filled 2019-09-30 (×2): qty 1000

## 2019-09-30 MED ORDER — SODIUM CHLORIDE 0.9 % IV BOLUS
1000.0000 mL | Freq: Once | INTRAVENOUS | Status: AC
  Administered 2019-09-30: 1000 mL via INTRAVENOUS

## 2019-09-30 MED ORDER — DEXTROSE 50 % IV SOLN
1.0000 | Freq: Once | INTRAVENOUS | Status: AC
  Administered 2019-09-30: 50 mL via INTRAVENOUS
  Filled 2019-09-30: qty 50

## 2019-09-30 MED ORDER — INSULIN ASPART 100 UNIT/ML IV SOLN
5.0000 [IU] | Freq: Once | INTRAVENOUS | Status: AC
  Administered 2019-09-30: 5 [IU] via INTRAVENOUS

## 2019-09-30 MED ORDER — CALCIUM GLUCONATE-NACL 1-0.675 GM/50ML-% IV SOLN
1.0000 g | Freq: Once | INTRAVENOUS | Status: AC
  Administered 2019-09-30: 1000 mg via INTRAVENOUS
  Filled 2019-09-30: qty 50

## 2019-09-30 MED ORDER — SODIUM CHLORIDE 0.9 % IV SOLN
1.0000 g | INTRAVENOUS | Status: DC
  Administered 2019-09-30: 1 g via INTRAVENOUS
  Filled 2019-09-30: qty 1

## 2019-09-30 MED ORDER — SODIUM BICARBONATE 8.4 % IV SOLN: INTRAVENOUS | Status: DC

## 2019-09-30 MED ORDER — ORAL CARE MOUTH RINSE
15.0000 mL | Freq: Two times a day (BID) | OROMUCOSAL | Status: DC
  Administered 2019-09-30 – 2019-10-02 (×4): 15 mL via OROMUCOSAL

## 2019-09-30 NOTE — Consult Note (Signed)
Consultation Note Date: 09/30/2019   Patient Name: Benjamin Cardenas  DOB: [birth year]  MRN: [id number]  Age / Sex: 86 y.o., male  PCP: Isaac Bliss, Rayford Halsted, MD Referring Physician: Mariel Aloe, MD  Reason for Consultation: Establishing goals of care  HPI/Patient Profile: 86 y.o. male  with past medical history of bladder cancer and recurrent bladder obstruction issues, functional decline at home, admitted on 09/29/2019 with acute kidney injury, hyperkalemia.   Clinical Assessment and Goals of Care: Patient has been admitted to hospital medicine service in the stepdown unit at [facility] in [place], [place] for sepsis presumed secondary to likely urinary source, also found to have on imaging evidence of chronic bilateral hydronephrosis, acute kidney injury.  Patient had a significant hyperkalemia with EKG changes at the time of admission.  Additionally with toxic metabolic encephalopathy and elevated troponins deemed secondary to demand ischemia.  Patient is being followed by renal services as well as interventional radiology.  Patient underwent nephrostomy tube placement.  Renal indices are being followed.  Nephrotoxins are being avoided.  Patient is status post administration of calcium gluconate as well as insulin for hyperkalemia.  A palliative consult has been requested for goals of care discussions, supportive care for patient and family.  Patient is resting in bed.  He has his eyes open and attempts to respond.  Appears to have some unintentional tremor at times.  Has some markers of nonverbal distress/discomfort at times.  Wife and son present at the bedside.  Patient appears pale, weak and chronically ill.  Introduced myself and palliative care as follows:  Palliative medicine is specialized medical care for people living with serious illness. It focuses on providing relief from  the symptoms and stress of a serious illness. The goal is to improve quality of life for both the patient and the family.  Goals of care: Broad aims of medical therapy in relation to the patient's values and preferences. Our aim is to provide medical care aimed at enabling patients to achieve the goals that matter most to them, given the circumstances of their particular medical situation and their constraints.   We reviewed about the patient's condition at home in the past few weeks.  We discussed about goals wishes and values.  Discussed about acute issues complicating this hospitalization as well as the patient's underlying serious illness.  HCPOA Wife who is present at the bedside.  Patient son is visiting as well.  Also has a daughter.  Patient is Jewish and wife states that they have support system through their local synagogue.  SUMMARY OF RECOMMENDATIONS   Agree with DNR Continue current mode of care Home with hospice versus residential hospice towards the end of this hospitalization. Request transitions of care consult for hospice arrangements to be initiated. Thank you for the consult.  Code Status/Advance Care Planning:  DNR    Symptom Management:   As above.   Palliative Prophylaxis:   Delirium Protocol    Psycho-social/Spiritual:   Desire for further Chaplaincy support:yes  Additional Recommendations: Caregiving  Support/Resources  Prognosis:   Unable to determine  Discharge Planning: Home with Hospice      Primary Diagnoses: Present on Admission: . Toxic metabolic encephalopathy . AKI (acute kidney injury) ([place]) . Altered mental status . Bladder cancer ([place]) . Hyponatremia . Hyperkalemia . Sepsis ([place]) . Hypercalcemia . Metabolic acidosis . Elevated troponin   I have reviewed the medical record, interviewed the patient and family, and examined the patient. The following aspects are pertinent.  Past Medical History:  Diagnosis Date  . BPH  with urinary obstruction   . Chronic constipation   . Chronic kidney disease    hydtronephrosis  . Chronic low back pain   . Cognitive impairment   . Dry skin dermatitis   . Elevated PSA   . Foley catheter in place    July 14, 2019  . History of bladder cancer    urologist--- dr Consuella Lose  . History of colon polyps   . History of sepsis    previous admission's for sepsis due to UTI's and CAP:   last admission's 05/ 2020 and 06/ 2020 sepsis secondary to UTI  . History of supraventricular tachycardia 11-03-2018  pt denies any symptoms since 2018   03/ 2018 during admission for CAP with sepsis-- episode of SVT;  pt cardiac evaluation after discharge 08-17-2016 (note in epic, b. bhagat PA),  normal echo,  event montior no sig. arrrhythmias heart rate range 36-120  . IDA (iron deficiency anemia)   . Lower urinary tract symptoms (LUTS)   . Lumbar stenosis   . OA (osteoarthritis)   . Prostate nodule   . Seasonal allergic rhinitis   . Urinary retention   . Wears glasses    Social History   Socioeconomic History  . Marital status: Married    Spouse name: Not on file  . Number of children: Not on file  . Years of education: Not on file  . Highest education level: Not on file  Occupational History  . Not on file  Tobacco Use  . Smoking status: Former Smoker    Packs/day: 1.50    Years: 34.00    Pack years: 51.00    Types: Cigarettes    Quit date: 02/17/1979    Years since quitting: 40.6  . Smokeless tobacco: Never Used  Vaping Use  . Vaping Use: Never used  Substance and Sexual Activity  . Alcohol use: Not Currently  . Drug use: No  . Sexual activity: Not on file  Other Topics Concern  . Not on file  Social History Narrative  . Not on file   Social Determinants of Health   Financial Resource Strain:   . Difficulty of Paying Living Expenses:   Food Insecurity:   . Worried About Charity fundraiser in the Last Year:   . Arboriculturist in the Last Year:     Transportation Needs:   . Film/video editor (Medical):   [name] Lack of Transportation (Non-Medical):   Physical Activity:   . Days of Exercise per Week:   . Minutes of Exercise per Session:   Stress:   . Feeling of Stress :   Social Connections:   . Frequency of Communication with Friends and Family:   . Frequency of Social Gatherings with Friends and Family:   . Attends Religious Services:   . Active Member of Clubs or Organizations:   . Attends Archivist Meetings:   [name] Marital Status:  Family History  Problem Relation Age of Onset  . Heart disease Mother   . Heart disease Father   . Cancer Sister        breast  . Cancer Brother        colon   Scheduled Meds: . Chlorhexidine Gluconate Cloth  6 each Topical Q0600  . heparin  5,000 Units Subcutaneous Q8H  . mouth rinse  15 mL Mouth Rinse BID  . sodium chloride flush  5 mL Intracatheter Q8H   Continuous Infusions: . ceFEPime (MAXIPIME) IV    . sodium bicarbonate 150 mEq in dextrose 5% 1000 mL 150 mEq (09/30/19 1251)  . sodium chloride     PRN Meds:.acetaminophen **OR** acetaminophen, ondansetron **OR** ondansetron (ZOFRAN) IV, senna-docusate Medications Prior to Admission:  Prior to Admission medications   Medication Sig Start Date End Date Taking? Authorizing Provider  cetirizine (ZYRTEC) 10 MG tablet Take 10 mg by mouth at bedtime.    Yes [provider]  Cyanocobalamin (VITAMIN B 12 PO) Take 1 tablet by mouth daily.   Yes [provider]  naphazoline-pheniramine (NAPHCON-A) 0.025-0.3 % ophthalmic solution Place 1 drop into both eyes 4 (four) times daily as needed for eye irritation.    Yes [provider]  polyethylene glycol (MIRALAX / GLYCOLAX) 17 g packet Take 17 g by mouth daily as needed for mild constipation.   Yes [provider]  TRULANCE 3 MG TABS Take 3 mg by mouth every morning.  10/29/15  Yes [provider]  vitamin B-12 1000 MCG tablet Take 1 tablet  (1,000 mcg total) by mouth daily. Patient not taking: Reported on 09/29/2019 09/24/18   Raiford Noble Latif, DO   Allergies  Allergen Reactions  . Amoxicillin Rash    Has patient had a PCN reaction causing immediate rash, facial/tongue/throat swelling, SOB or lightheadedness with hypotension:No Has patient had a PCN reaction causing severe rash involving mucus membranes or skin necrosis:unsure Has patient had a PCN reaction that required hospitalization:No Has patient had a PCN reaction occurring within the last 10 years:unsure If all of the above answers are "NO", then may proceed with Cephalosporin use.   . Ciprofloxacin Swelling and Rash  . Sulfasalazine Swelling  . Sulfa Antibiotics Swelling  . Doxycycline Hyclate Rash  . Levofloxacin Rash   Review of Systems Mostly nonverbal, appears to be in at least mild to moderate distress Physical Exam Mostly nonverbal, appears to be in at least mild to moderate distress Regular work of breathing S1-S2 Has some contractures No edema Abdomen is not distended Follows some commands tracks me in the room moves extremities on his own  Vital Signs: BP (!) 106/50   Pulse (!) 108   Temp 97.6 F (36.4 C) (Axillary)   Resp 15   Wt 55.1 kg   SpO2 100%   BMI 19.61 kg/m  Pain Scale: CPOT       SpO2: SpO2: 100 % O2 Device:SpO2: 100 % O2 Flow Rate: .   IO: Intake/output summary:   Intake/Output Summary (Last 24 hours) at 09/30/2019 1335 Last data filed at 09/30/2019 1030 Gross per 24 hour  Intake 223.37 ml  Output 1625 ml  Net -1401.63 ml    LBM: Last BM Date:  (PTA) Baseline Weight: Weight: 55.1 kg Most recent weight: Weight: 55.1 kg     Palliative Assessment/Data:   PPS 30%  Time In:  10 Time Out:  11  Time Total:  60  Greater than 50%  of this time was  spent counseling and coordinating care related to the above assessment and plan.  Signed by: Loistine Chance, MD   Please contact Palliative Medicine Team phone at [phone]  for questions and concerns.  For individual provider: See Shea Evans

## 2019-09-30 NOTE — Progress Notes (Signed)
Subjective:  Admitted to ICU--- reasonable UOP from nephrostomy tubes-  All numbers better but still very abnormal- wife and son at bedside    Objective Vital signs in last 24 hours: Vitals:   09/30/19 0400 09/30/19 0500 09/30/19 0600 09/30/19 0833  BP: 118/76 (!) 140/124 119/85   Pulse: (!) 116 (!) 111 (!) 118   Resp: (!) 26 (!) 23 20   Temp: (!) 97.2 F (36.2 C)   97.6 F (36.4 C)  TempSrc: Axillary   Axillary  SpO2: 100% 100% 100%   Weight:       Weight change:   Intake/Output Summary (Last 24 hours) at 09/30/2019 1158 Last data filed at 09/30/2019 1030 Gross per 24 hour  Intake 223.37 ml  Output 1625 ml  Net -1401.63 ml   Assessment/Plan: 86 year old male with bladder cancer and recurrent bladder obstruction issues-has exhausted options except for percutaneous nephrostomy tubes.  He has acute kidney injury and hyperkalemia complicating his obstruction this time 1.Renal- likely mostly secondary to urinary obstruction.  Cannot rule out ATN with presentation of hypotension.  Also received contrast for his abdominal CT.   Appreciate interventional radiology being able to provide percutaneous nephrostomy tubes for patient at present as that will likely improve his acute kidney injury and his hyperkalemia quite a bit.  Have ordered another potassium level and will continue to attempt to treat medically until can improve on its own.  Given that he is DNR, wife had requested hospice referral -  he is not a candidate for any hemodialysis in the interim.  Hopefully relief of the obstruction will improve his renal function.  Urine that was obtained is bloody but also WBCs.  Getting antibiotics per primary 2. Hypertension/volume  -hypotensive initially, this has improved-unable to determine volume status at this time.  Per ER was not overloaded.  Could continue to use IV fluids as needed- will change to bicarb based fluids 3. Anemia  -does not appear to be an issue.  Actually with hemoglobin close  to 15 this argues for him being dry.  Feel free to use IV fluids as above 4.  Hyperkalemia- due to acute kidney injury due to bladder obstruction.  Will recheck potassium and attempt to treat medically for now.... Is not a candidate for dialysis, even short-term.  Will give bicarb based fluids as well     Louis Meckel    Labs: Basic Metabolic Panel: Recent Labs  Lab 09/29/19 1258 09/29/19 1500 09/30/19 0303  NA 122*  --  124*  K 8.0*  --  7.0*  CL 89*  --  97*  CO2 16*  --  13*  GLUCOSE 150* 100* 113*  BUN 228*  --  217*  CREATININE 6.29*  --  4.99*  CALCIUM 10.4*  --  9.6   Liver Function Tests: Recent Labs  Lab 09/29/19 1258 09/30/19 0303  AST 32 32  ALT 31 29  ALKPHOS 109 92  BILITOT 0.7 0.9  PROT 9.4* 8.0  ALBUMIN 5.2* 4.3   Recent Labs  Lab 09/29/19 1258  LIPASE 323*   No results for input(s): AMMONIA in the last 168 hours. CBC: Recent Labs  Lab 09/29/19 1258 09/30/19 0303  WBC 16.6* 15.4*  NEUTROABS 15.4*  --   HGB 14.9 14.3  HCT 42.5 41.1  MCV 86.2 87.3  PLT 421* 371   Cardiac Enzymes: No results for input(s): CKTOTAL, CKMB, CKMBINDEX, TROPONINI in the last 168 hours. CBG: Recent Labs  Lab 09/29/19 1512 09/30/19 0845  GLUCAP 108* 110*    Iron Studies: No results for input(s): IRON, TIBC, TRANSFERRIN, FERRITIN in the last 72 hours. Studies/Results: DG Chest Port 1 View  Result Date: 09/29/2019 CLINICAL DATA:  Cough, weakness EXAM: PORTABLE CHEST 1 VIEW COMPARISON:  08/13/2018 FINDINGS: The heart size and mediastinal contours are within normal limits. Atherosclerotic calcification of the aortic knob. Both lungs are clear. The visualized skeletal structures are unremarkable. IMPRESSION: No active disease. Electronically Signed   By: Davina Poke D.O.   On: 09/29/2019 13:38   CT Renal Stone Study  Result Date: 09/29/2019 CLINICAL DATA:  86 year old male with history of acute renal failure. EXAM: CT ABDOMEN AND PELVIS WITHOUT  CONTRAST TECHNIQUE: Multidetector CT imaging of the abdomen and pelvis was performed following the standard protocol without IV contrast. COMPARISON:  CT the abdomen and pelvis 08/04/2019. FINDINGS: Lower chest: Atherosclerotic calcifications in the descending thoracic aorta as well as the left circumflex and right coronary arteries. Hepatobiliary: No definite suspicious cystic or solid hepatic lesions are confidently identified on today's noncontrast CT examination. Amorphous high attenuation material lying dependently in the gallbladder, likely to reflect biliary sludge and/or tiny noncalcified gallstones. Gallbladder is nearly completely decompressed without surrounding inflammatory changes. Pancreas: No definite pancreatic mass or peripancreatic fluid collections or inflammatory changes are noted on today's noncontrast CT examination. Spleen: Unremarkable. Adrenals/Urinary Tract: No calcifications are identified within the collecting system of either kidney, along the course of either ureter, or within the lumen of the urinary bladder. Mild right renal atrophy. Moderate right and severe left-sided hydroureteronephrosis, similar to the prior examination. 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 Small amount of gas non dependently in the urinary bladder. Urinary bladder is otherwise unremarkable in appearance. Previously noted Foley balloon catheter has been removed. Stomach/Bowel: Unenhanced appearance of the stomach is normal. No pathologic dilatation of small bowel or colon. The appendix is not confidently identified and may be surgically absent. Regardless, there are no inflammatory changes noted adjacent to the cecum to suggest the presence of an acute appendicitis at this time. Vascular/Lymphatic: Aortic atherosclerosis with mild aneurysmal dilatation of the infrarenal abdominal aorta which measures up to 2.9 x 3.1 cm (mean diameter of 3 cm). No lymphadenopathy  noted in the abdomen or pelvis. Reproductive: Postoperative changes of TURP are noted in the prostate gland. Seminal vesicles are unremarkable in appearance. Other: No significant volume of ascites.  No pneumoperitoneum. Musculoskeletal: There are no aggressive appearing lytic or blastic lesions noted in the visualized portions of the skeleton. IMPRESSION: 1. Chronic bilateral hydroureteronephrosis (left greater than right), very similar to the prior examination. No obstructing stone identified. The possibility of distal ureteral stricture should be considered at or near the ureterovesicular junctions. 2. Mild right renal atrophy. 3. Small amount of gas non dependently in the lumen of the urinary bladder. This could be iatrogenic if there has been recent catheterization. Alternatively, correlation with urinalysis is recommended to exclude the possibility of urinary tract infection with gas-forming organisms. 4. Biliary sludge and/or tiny gallstones lying dependently in the gallbladder, without findings to suggest an acute cholecystitis at this time. 5. Aortic atherosclerosis with mild aneurysmal dilatation of the infrarenal abdominal aorta (3 cm in diameter). Recommend followup by ultrasound in 3 years. This recommendation follows ACR consensus guidelines: White Paper of the ACR Incidental Findings Committee II on Vascular Findings. J Am Coll Radiol 2013; 10:789-794. Electronically Signed   By: Vinnie Langton M.D.   On: 09/29/2019 16:12  IR NEPHROSTOMY PLACEMENT LEFT  Result Date: 09/30/2019 INDICATION: History of bladder cancer, now with obstructive bilateral uropathy and sepsis, post failed attempted placement of bilateral ureteral stents. As such, the patient presents today for image guided placement of bilateral percutaneous nephrostomy catheters for renal preservation and infection source control purposes. EXAM: 1. ULTRASOUND GUIDANCE FOR PUNCTURE OF THE BILATERAL RENAL COLLECTING SYSTEMS 2. FLUOROSCOPIC  GUIDED BILATERAL PERCUTANEOUS NEPHROSTOMY TUBE PLACEMENT. COMPARISON:  CT abdomen pelvis-earlier same day; 08/04/2019 MEDICATIONS: Patient is currently admitted to the hospital receiving intravenous antibiotics; The antibiotic was administered in an appropriate time frame prior to skin puncture. ANESTHESIA/SEDATION: None CONTRAST:  15 mL Isovue 300 - administered into the renal collecting system FLUOROSCOPY TIME:  3 minutes, 18 seconds (33 mGy) COMPLICATIONS: None immediate. PROCEDURE: The procedure, risks, benefits, and alternatives were explained to the patient. Questions regarding the procedure were encouraged and answered. The patient understands and consents to the procedure. A timeout was performed prior to the initiation of the procedure. The bilateral flanks were prepped and draped in the usual sterile fashion and a sterile drape was applied covering the operative field. A sterile gown and sterile gloves were used for the procedure. Local anesthesia was provided with 1% Lidocaine with epinephrine. Beginning with the left kidney, under direct ultrasound guidance, a 20 gauge needle was advanced into the renal collecting system after the subcutaneous tissues were anesthetized with 1% lidocaine with epinephrine. An ultrasound image documentation was performed. Access within the collecting system was confirmed with the efflux of urine followed by limited contrast injection. Over a Nitrex wire, the tract was dilated with an Accustick stent. Next, under intermittent fluoroscopic guidance and over a short Amplatz wire, the track was dilated ultimately allowing placement of a 10-French percutaneous nephrostomy catheter which was advanced to the level of the renal pelvis where the coil was formed and locked. Contrast was injected and several spot fluoroscopic images were obtained in various obliquities. The catheter was secured at the skin with an interrupted prolene retention suture and stat lock device and connected  to a gravity bag was placed. The identical procedure was repeated for the contralateral right kidney ultimately allowing placement of a 10 French percutaneous nephrostomy catheter with end coiled and locked within the right renal pelvis. Note was made to avoid the slightly interposed ascending colon as was demonstrated on preceding noncontrast abdominal CT. Dressings were applied. The patient tolerated the above procedures fairly well without immediate postprocedural complication though had to be restrained the fluoroscopy table due to altered mental status. FINDINGS: Ultrasound scanning demonstrates a moderate to severely dilatation of the bilateral renal collecting systems. Under a combination of ultrasound and fluoroscopic guidance, a posterior inferior calix was targeted allowing placement of a 10-French percutaneous nephrostomy catheter with end coiled and locked within the renal pelvis. Contrast injection confirmed appropriate positioning. IMPRESSION: Successful ultrasound and fluoroscopic guided placement of bilateral percutaneous nephrostomy catheters. Electronically Signed   By: Sandi Mariscal M.D.   On: 09/30/2019 07:52   IR NEPHROSTOMY PLACEMENT RIGHT  Result Date: 09/30/2019 INDICATION: History of bladder cancer, now with obstructive bilateral uropathy and sepsis, post failed attempted placement of bilateral ureteral stents. As such, the patient presents today for image guided placement of bilateral percutaneous nephrostomy catheters for renal preservation and infection source control purposes. EXAM: 1. ULTRASOUND GUIDANCE FOR PUNCTURE OF THE BILATERAL RENAL COLLECTING SYSTEMS 2. FLUOROSCOPIC GUIDED BILATERAL PERCUTANEOUS NEPHROSTOMY TUBE PLACEMENT. COMPARISON:  CT abdomen pelvis-earlier same day; 08/04/2019 MEDICATIONS: Patient is currently admitted to the hospital  receiving intravenous antibiotics; The antibiotic was administered in an appropriate time frame prior to skin puncture.  ANESTHESIA/SEDATION: None CONTRAST:  15 mL Isovue 300 - administered into the renal collecting system FLUOROSCOPY TIME:  3 minutes, 18 seconds (33 mGy) COMPLICATIONS: None immediate. PROCEDURE: The procedure, risks, benefits, and alternatives were explained to the patient. Questions regarding the procedure were encouraged and answered. The patient understands and consents to the procedure. A timeout was performed prior to the initiation of the procedure. The bilateral flanks were prepped and draped in the usual sterile fashion and a sterile drape was applied covering the operative field. A sterile gown and sterile gloves were used for the procedure. Local anesthesia was provided with 1% Lidocaine with epinephrine. Beginning with the left kidney, under direct ultrasound guidance, a 20 gauge needle was advanced into the renal collecting system after the subcutaneous tissues were anesthetized with 1% lidocaine with epinephrine. An ultrasound image documentation was performed. Access within the collecting system was confirmed with the efflux of urine followed by limited contrast injection. Over a Nitrex wire, the tract was dilated with an Accustick stent. Next, under intermittent fluoroscopic guidance and over a short Amplatz wire, the track was dilated ultimately allowing placement of a 10-French percutaneous nephrostomy catheter which was advanced to the level of the renal pelvis where the coil was formed and locked. Contrast was injected and several spot fluoroscopic images were obtained in various obliquities. The catheter was secured at the skin with an interrupted prolene retention suture and stat lock device and connected to a gravity bag was placed. The identical procedure was repeated for the contralateral right kidney ultimately allowing placement of a 10 French percutaneous nephrostomy catheter with end coiled and locked within the right renal pelvis. Note was made to avoid the slightly interposed ascending  colon as was demonstrated on preceding noncontrast abdominal CT. Dressings were applied. The patient tolerated the above procedures fairly well without immediate postprocedural complication though had to be restrained the fluoroscopy table due to altered mental status. FINDINGS: Ultrasound scanning demonstrates a moderate to severely dilatation of the bilateral renal collecting systems. Under a combination of ultrasound and fluoroscopic guidance, a posterior inferior calix was targeted allowing placement of a 10-French percutaneous nephrostomy catheter with end coiled and locked within the renal pelvis. Contrast injection confirmed appropriate positioning. IMPRESSION: Successful ultrasound and fluoroscopic guided placement of bilateral percutaneous nephrostomy catheters. Electronically Signed   By: Sandi Mariscal M.D.   On: 09/30/2019 07:52   Medications: Infusions: . sodium chloride 100 mL/hr at 09/30/19 0541  . ceFEPime (MAXIPIME) IV    . sodium chloride      Scheduled Medications: . Chlorhexidine Gluconate Cloth  6 each Topical Q0600  . heparin  5,000 Units Subcutaneous Q8H  . mouth rinse  15 mL Mouth Rinse BID  . sodium chloride flush  5 mL Intracatheter Q8H    have reviewed scheduled and prn medications.  Physical Exam: General: thin- delerious- mittens Heart: tachy Lungs: mostly clear Abdomen: soft, non tender Extremities: no edema bilat neph tubes-  Clear urine draining     09/30/2019,11:58 AM  LOS: 1 day

## 2019-09-30 NOTE — Progress Notes (Signed)
Initial Nutrition Assessment  DOCUMENTATION CODES:   Not applicable  INTERVENTION:  Will provide nutrition supplements as appropriate with diet advancement Monitor for GOC's   NUTRITION DIAGNOSIS:   Inadequate oral intake related to cancer and cancer related treatments (recurrent bilateral hydronephrosis secondary to obstructive bladder cancer) as evidenced by percent weight loss.   GOAL:   Patient will meet greater than or equal to 90% of their needs    MONITOR:   Diet advancement, Labs, I & O's, Weight trends  REASON FOR ASSESSMENT:   Malnutrition Screening Tool    ASSESSMENT:  RD working remotely.  86 year old male with medical history significant for bladder cancer with multiple episodes of urinary retention, bilateral hydronephrosis s/p TURP (8/20) and swelling Foley catheter (09/20/18), unsuccessful attempt at stent placement on 5/24 due to inflamed bladder presented with reports of worsening confusion, and decreased urination over the past few weeks.  Patient is s/p PCN placement for bilateral hydronephrosis from obstructive bladder cancer on 6/26.  Patient alert only to self, unable to obtain nutrition history at this time. Per chart, over the past 4 days he has had decreased mobility to control urine, unable to walk on his own, requiring increased assistance from home nursing,  patient's wife has considered hospice referral due to worsening clinical status and guarded prognosis prior to hospitalization, palliative care consulted.   Non-pitting BUE edema per RN assessment I/Os: -751 ml since admit UOP: 975 ml x 24 hrs  Per chart, weights have trended down 15 lbs (11.3%) in the past month which is significant. Given history of present illness, highly suspect malnutrition however unable to identify at this time. Patient is NPO, will continue to monitor for diet advancement and provide nutrition supplements as appropriate.   Per notes: -reasonable UOP from  nephrostomy tubes -not a candidate for dialysis -sepsis presumed secondary to likely UTI -toxic metabolic encephalopathy, multifactorial -elevated lipase, no overt abdominal pain, no concerning findings on CT abdomen  -elevated troponin resolved  Medications reviewed and include:  IVPV: Maxipime Na bicarbonate 150 mEq in D5 @ 100 ml/hr Labs: CBG 110, Na 124 (L), K 7 (H), BUN 217 (H), Cr 4.99 (H), WBC 15.4 (H)  NUTRITION - FOCUSED PHYSICAL EXAM: Unable to complete at this time, RD working remotely.    Diet Order:   Diet Order            Diet NPO time specified  Diet effective now                 EDUCATION NEEDS:   No education needs have been identified at this time  Skin:  Skin Assessment: Reviewed RN Assessment  Last BM:  pta  Height:   Ht Readings from Last 1 Encounters:  08/28/19 5\' 6"  (1.676 m)    Weight:   Wt Readings from Last 1 Encounters:  09/30/19 55.1 kg    BMI:  Body mass index is 19.61 kg/m.  Estimated Nutritional Needs:   Kcal:  1650-1930  Protein:  83-97  Fluid:  >/= 1.5 L/day   Lajuan Lines, RD, LDN Clinical Nutrition After Hours/Weekend Pager # in [place]

## 2019-09-30 NOTE — Plan of Care (Signed)

## 2019-09-30 NOTE — Progress Notes (Signed)
Chief Complaint: Patient was seen today for follow up (B)PCN  Supervising Physician: Sandi Mariscal  Patient Status: [facility] - In-pt  Subjective: Pt resting. Family at bedside. S/p (B)PCN placement for bilat hydronephrosis from obstructive bladder cancer. AKI on CKD with hyperkalemia.   Objective: Physical Exam: BP 119/85   Pulse (!) 118   Temp 97.6 F (36.4 C) (Axillary)   Resp 20   Wt 55.1 kg   SpO2 100%   BMI 19.61 kg/m  (R)PCN intact, site clean, NT. Output slightly blood tinged but no clots (L)PCN intact, site clean, NT Output thin clear urine   Current Facility-Administered Medications:  .  0.9 %  sodium chloride infusion, , Intravenous, Continuous, Oretha Milch D, MD, Last Rate: 100 mL/hr at 09/30/19 0541, Restarted at 09/30/19 0541 .  acetaminophen (TYLENOL) tablet 650 mg, 650 mg, Oral, Q6H PRN **OR** acetaminophen (TYLENOL) suppository 650 mg, 650 mg, Rectal, Q6H PRN, Nettey, Shayla D, MD .  ceFEPIme (MAXIPIME) 1 g in sodium chloride 0.9 % 100 mL IVPB, 1 g, Intravenous, Q24H, Lenis Noon, RPH .  Chlorhexidine Gluconate Cloth 2 % PADS 6 each, 6 each, Topical, Q0600, Desiree Hane, MD, 6 each at 09/29/19 2200 .  heparin injection 5,000 Units, 5,000 Units, Subcutaneous, Q8H, Oretha Milch D, MD, 5,000 Units at 09/30/19 0521 .  MEDLINE mouth rinse, 15 mL, Mouth Rinse, BID, Mariel Aloe, MD, 15 mL at 09/30/19 1004 .  ondansetron (ZOFRAN) tablet 4 mg, 4 mg, Oral, Q6H PRN **OR** ondansetron (ZOFRAN) injection 4 mg, 4 mg, Intravenous, Q6H PRN, Oretha Milch D, MD .  senna-docusate (Senokot-S) tablet 1 tablet, 1 tablet, Oral, QHS PRN, Oretha Milch D, MD .  [COMPLETED] sodium chloride 0.9 % bolus 1,000 mL, 1,000 mL, Intravenous, Once, Stopping Infusion hung by another clincian at 09/29/19 2307 **AND** [COMPLETED] sodium chloride 0.9 % bolus 500 mL, 500 mL, Intravenous, Once, Stopped at 09/29/19 1427 **AND** sodium chloride 0.9 % bolus 250 mL, 250 mL, Intravenous,  Once, Hayden Rasmussen, MD .  sodium chloride flush (NS) 0.9 % injection 5 mL, 5 mL, Intracatheter, Q8H, Sandi Mariscal, MD, 5 mL at 09/30/19 0526  Labs: CBC Recent Labs    09/29/19 1258 09/30/19 0303  WBC 16.6* 15.4*  HGB 14.9 14.3  HCT 42.5 41.1  PLT 421* 371   BMET Recent Labs    09/29/19 1258 09/29/19 1258 09/29/19 1500 09/30/19 0303  NA 122*  --   --  124*  K 8.0*  --   --  7.0*  CL 89*  --   --  97*  CO2 16*  --   --  13*  GLUCOSE 150*   < > 100* 113*  BUN 228*  --   --  217*  CREATININE 6.29*  --   --  4.99*  CALCIUM 10.4*  --   --  9.6   < > = values in this interval not displayed.   LFT Recent Labs    09/29/19 1258 09/29/19 1258 09/30/19 0303  PROT 9.4*   < > 8.0  ALBUMIN 5.2*   < > 4.3  AST 32   < > 32  ALT 31   < > 29  ALKPHOS 109   < > 92  BILITOT 0.7   < > 0.9  LIPASE 323*  --   --    < > = values in this interval not displayed.   PT/INR Recent Labs    09/29/19 1258  LABPROT 12.0  INR 0.9     Studies/Results: DG Chest Port 1 View  Result Date: 09/29/2019 CLINICAL DATA:  Cough, weakness EXAM: PORTABLE CHEST 1 VIEW COMPARISON:  08/13/2018 FINDINGS: The heart size and mediastinal contours are within normal limits. Atherosclerotic calcification of the aortic knob. Both lungs are clear. The visualized skeletal structures are unremarkable. IMPRESSION: No active disease. Electronically Signed   By: Davina Poke D.O.   On: 09/29/2019 13:38   CT Renal Stone Study  Result Date: 09/29/2019 CLINICAL DATA:  86 year old male with history of acute renal failure. EXAM: CT ABDOMEN AND PELVIS WITHOUT CONTRAST TECHNIQUE: Multidetector CT imaging of the abdomen and pelvis was performed following the standard protocol without IV contrast. COMPARISON:  CT the abdomen and pelvis 08/04/2019. FINDINGS: Lower chest: Atherosclerotic calcifications in the descending thoracic aorta as well as the left circumflex and right coronary arteries. Hepatobiliary: No definite  suspicious cystic or solid hepatic lesions are confidently identified on today's noncontrast CT examination. Amorphous high attenuation material lying dependently in the gallbladder, likely to reflect biliary sludge and/or tiny noncalcified gallstones. Gallbladder is nearly completely decompressed without surrounding inflammatory changes. Pancreas: No definite pancreatic mass or peripancreatic fluid collections or inflammatory changes are noted on today's noncontrast CT examination. Spleen: Unremarkable. Adrenals/Urinary Tract: No calcifications are identified within the collecting system of either kidney, along the course of either ureter, or within the lumen of the urinary bladder. Mild right renal atrophy. Moderate right and severe left-sided hydroureteronephrosis, similar to the prior examination. 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 Small amount of gas non dependently in the urinary bladder. Urinary bladder is otherwise unremarkable in appearance. Previously noted Foley balloon catheter has been removed. Stomach/Bowel: Unenhanced appearance of the stomach is normal. No pathologic dilatation of small bowel or colon. The appendix is not confidently identified and may be surgically absent. Regardless, there are no inflammatory changes noted adjacent to the cecum to suggest the presence of an acute appendicitis at this time. Vascular/Lymphatic: Aortic atherosclerosis with mild aneurysmal dilatation of the infrarenal abdominal aorta which measures up to 2.9 x 3.1 cm (mean diameter of 3 cm). No lymphadenopathy noted in the abdomen or pelvis. Reproductive: Postoperative changes of TURP are noted in the prostate gland. Seminal vesicles are unremarkable in appearance. Other: No significant volume of ascites.  No pneumoperitoneum. Musculoskeletal: There are no aggressive appearing lytic or blastic lesions noted in the visualized portions of the skeleton. IMPRESSION: 1.  Chronic bilateral hydroureteronephrosis (left greater than right), very similar to the prior examination. No obstructing stone identified. The possibility of distal ureteral stricture should be considered at or near the ureterovesicular junctions. 2. Mild right renal atrophy. 3. Small amount of gas non dependently in the lumen of the urinary bladder. This could be iatrogenic if there has been recent catheterization. Alternatively, correlation with urinalysis is recommended to exclude the possibility of urinary tract infection with gas-forming organisms. 4. Biliary sludge and/or tiny gallstones lying dependently in the gallbladder, without findings to suggest an acute cholecystitis at this time. 5. Aortic atherosclerosis with mild aneurysmal dilatation of the infrarenal abdominal aorta (3 cm in diameter). Recommend followup by ultrasound in 3 years. This recommendation follows ACR consensus guidelines: White Paper of the ACR Incidental Findings Committee II on Vascular Findings. J Am Coll Radiol 2013; 10:789-794. Electronically Signed   By: Vinnie Langton M.D.   On: 09/29/2019 16:12   IR NEPHROSTOMY PLACEMENT LEFT  Result Date: 09/30/2019 INDICATION: History of bladder cancer, now  with obstructive bilateral uropathy and sepsis, post failed attempted placement of bilateral ureteral stents. As such, the patient presents today for image guided placement of bilateral percutaneous nephrostomy catheters for renal preservation and infection source control purposes. EXAM: 1. ULTRASOUND GUIDANCE FOR PUNCTURE OF THE BILATERAL RENAL COLLECTING SYSTEMS 2. FLUOROSCOPIC GUIDED BILATERAL PERCUTANEOUS NEPHROSTOMY TUBE PLACEMENT. COMPARISON:  CT abdomen pelvis-earlier same day; 08/04/2019 MEDICATIONS: Patient is currently admitted to the hospital receiving intravenous antibiotics; The antibiotic was administered in an appropriate time frame prior to skin puncture. ANESTHESIA/SEDATION: None CONTRAST:  15 mL Isovue 300 -  administered into the renal collecting system FLUOROSCOPY TIME:  3 minutes, 18 seconds (33 mGy) COMPLICATIONS: None immediate. PROCEDURE: The procedure, risks, benefits, and alternatives were explained to the patient. Questions regarding the procedure were encouraged and answered. The patient understands and consents to the procedure. A timeout was performed prior to the initiation of the procedure. The bilateral flanks were prepped and draped in the usual sterile fashion and a sterile drape was applied covering the operative field. A sterile gown and sterile gloves were used for the procedure. Local anesthesia was provided with 1% Lidocaine with epinephrine. Beginning with the left kidney, under direct ultrasound guidance, a 20 gauge needle was advanced into the renal collecting system after the subcutaneous tissues were anesthetized with 1% lidocaine with epinephrine. An ultrasound image documentation was performed. Access within the collecting system was confirmed with the efflux of urine followed by limited contrast injection. Over a Nitrex wire, the tract was dilated with an Accustick stent. Next, under intermittent fluoroscopic guidance and over a short Amplatz wire, the track was dilated ultimately allowing placement of a 10-French percutaneous nephrostomy catheter which was advanced to the level of the renal pelvis where the coil was formed and locked. Contrast was injected and several spot fluoroscopic images were obtained in various obliquities. The catheter was secured at the skin with an interrupted prolene retention suture and stat lock device and connected to a gravity bag was placed. The identical procedure was repeated for the contralateral right kidney ultimately allowing placement of a 10 French percutaneous nephrostomy catheter with end coiled and locked within the right renal pelvis. Note was made to avoid the slightly interposed ascending colon as was demonstrated on preceding noncontrast  abdominal CT. Dressings were applied. The patient tolerated the above procedures fairly well without immediate postprocedural complication though had to be restrained the fluoroscopy table due to altered mental status. FINDINGS: Ultrasound scanning demonstrates a moderate to severely dilatation of the bilateral renal collecting systems. Under a combination of ultrasound and fluoroscopic guidance, a posterior inferior calix was targeted allowing placement of a 10-French percutaneous nephrostomy catheter with end coiled and locked within the renal pelvis. Contrast injection confirmed appropriate positioning. IMPRESSION: Successful ultrasound and fluoroscopic guided placement of bilateral percutaneous nephrostomy catheters. Electronically Signed   By: Sandi Mariscal M.D.   On: 09/30/2019 07:52   IR NEPHROSTOMY PLACEMENT RIGHT  Result Date: 09/30/2019 INDICATION: History of bladder cancer, now with obstructive bilateral uropathy and sepsis, post failed attempted placement of bilateral ureteral stents. As such, the patient presents today for image guided placement of bilateral percutaneous nephrostomy catheters for renal preservation and infection source control purposes. EXAM: 1. ULTRASOUND GUIDANCE FOR PUNCTURE OF THE BILATERAL RENAL COLLECTING SYSTEMS 2. FLUOROSCOPIC GUIDED BILATERAL PERCUTANEOUS NEPHROSTOMY TUBE PLACEMENT. COMPARISON:  CT abdomen pelvis-earlier same day; 08/04/2019 MEDICATIONS: Patient is currently admitted to the hospital receiving intravenous antibiotics; The antibiotic was administered in an appropriate time frame prior to  skin puncture. ANESTHESIA/SEDATION: None CONTRAST:  15 mL Isovue 300 - administered into the renal collecting system FLUOROSCOPY TIME:  3 minutes, 18 seconds (33 mGy) COMPLICATIONS: None immediate. PROCEDURE: The procedure, risks, benefits, and alternatives were explained to the patient. Questions regarding the procedure were encouraged and answered. The patient understands and  consents to the procedure. A timeout was performed prior to the initiation of the procedure. The bilateral flanks were prepped and draped in the usual sterile fashion and a sterile drape was applied covering the operative field. A sterile gown and sterile gloves were used for the procedure. Local anesthesia was provided with 1% Lidocaine with epinephrine. Beginning with the left kidney, under direct ultrasound guidance, a 20 gauge needle was advanced into the renal collecting system after the subcutaneous tissues were anesthetized with 1% lidocaine with epinephrine. An ultrasound image documentation was performed. Access within the collecting system was confirmed with the efflux of urine followed by limited contrast injection. Over a Nitrex wire, the tract was dilated with an Accustick stent. Next, under intermittent fluoroscopic guidance and over a short Amplatz wire, the track was dilated ultimately allowing placement of a 10-French percutaneous nephrostomy catheter which was advanced to the level of the renal pelvis where the coil was formed and locked. Contrast was injected and several spot fluoroscopic images were obtained in various obliquities. The catheter was secured at the skin with an interrupted prolene retention suture and stat lock device and connected to a gravity bag was placed. The identical procedure was repeated for the contralateral right kidney ultimately allowing placement of a 10 French percutaneous nephrostomy catheter with end coiled and locked within the right renal pelvis. Note was made to avoid the slightly interposed ascending colon as was demonstrated on preceding noncontrast abdominal CT. Dressings were applied. The patient tolerated the above procedures fairly well without immediate postprocedural complication though had to be restrained the fluoroscopy table due to altered mental status. FINDINGS: Ultrasound scanning demonstrates a moderate to severely dilatation of the bilateral  renal collecting systems. Under a combination of ultrasound and fluoroscopic guidance, a posterior inferior calix was targeted allowing placement of a 10-French percutaneous nephrostomy catheter with end coiled and locked within the renal pelvis. Contrast injection confirmed appropriate positioning. IMPRESSION: Successful ultrasound and fluoroscopic guided placement of bilateral percutaneous nephrostomy catheters. Electronically Signed   By: Sandi Mariscal M.D.   On: 09/30/2019 07:52    Assessment/Plan: S/p (B)PCN placement yesterday. K and Cr improved. Good UOP from both drains. Plan per primary and Urology. IR following.    LOS: 1 day   I spent a total of 20 minutes in face to face in clinical consultation, greater than 50% of which was counseling/coordinating care for (B)PCN  Ascencion Dike PA-C 09/30/2019 11:07 AM

## 2019-09-30 NOTE — Progress Notes (Addendum)
PROGRESS NOTE    Benjamin Cardenas  OVF:[id number] DOB: April 29, 1929 DOA: 09/29/2019 PCP: Isaac Bliss, Rayford Halsted, MD   Brief Narrative: Benjamin Cardenas is a 86 y.o. male with medical history significant for bladder cancer with multiple episodes of urinary retention with known bilateral hydronephrosis despite TURP (11/07/2018) and swelling Foley catheter (09/20/2018) and unsuccessful attempt stent placement on 08/28/2019 by Dr. Karsten Ro due to inflamed bladder.  Patient presented secondary to worsening confusion, decreased urination over the past week.  On admission, patient found to have severe AKI with associated hyperkalemia in setting of severe bladder outlet obstruction.  Interventional radiology was consulted and placed nephrostomy tubes on admission.  Nephrology was consulted for management of AKI with recommendations for no hemodialysis but rather continued medical management.  Patient started on IV fluids for management of AKI.  Blood and urine cultures obtained and patient started on empiric cefepime.   Assessment & Plan:   Active Problems:   Hyponatremia   Altered mental status   Bladder cancer (HCC)   AKI (acute kidney injury) ([place])   Hyperkalemia   Toxic metabolic encephalopathy   Sepsis (HCC)   Hypercalcemia   Metabolic acidosis   Elevated troponin   Chronic bilateral hydronephrosis Patient follows with urology as an outpatient. Recently failed stent placement. Imaging was significant for persistent hydronephrosis. IR consulted and placed bilateral nephrostomy tubes on 6/25. Good urine output from both tubes. -IR recommendations for tube management -Urology recommendations  Obstructive uropathy AKI Metabolic acidosis Patient with a baseline creatinine of 1.7. Creatinine on admission was 6.29 with an associated BUN of 228. Patient was also transiently hypotensive which could have contributed to some ATN. Nephrology was consulted on admission with recommendations for no  dialysis, whether long or short-term. Patient started on IV fluids with mild improvement of kidney function. Nephrostomy tubes placed as mentioned above.  Sepsis Present on admission with possible urinary source. It is possible this could not be sepsis, however. If infectious workup is negative, will rule out.  Hyperkalemia Potassium of eight on admission. Patient initially with peaked T waves and received calcium gluconate, insulin in the ED. Potassium down to seven. -Give another dose of insulin, recheck potassium every 4 hours  Hypotension Likely secondary to hypovolemia. Resolved with IV fluids. Urine and blood cultures obtained on admission.  Hypercalcemia Mild. Does not appear associated with cancer. Resolved with IV fluids.  Acute metabolic encephalopathy In setting of significantly elevated BUN. At baseline, patient is alert and oriented x4. -Watch for improvement of mental status -N.p.o. until can take safely by mouth  Hyperosmolar hyponatremia Likely hypovolemic. Patient's serum osmolality was 354 on admission in setting of significantly elevated BUN. Sodium is improved slightly. -Will obtain BMP more frequently, at least every 6 hours  Leukocytosis Mild. Possible patient has an associated UTI. Patient was started on cefepime IV empirically. Urine cultures obtained and pending. Patient also obtain blood cultures on admission -Continue cefepime while awaiting culture results  Elevated troponin Peak of 73 with subsequent downtrend. Likely secondary to demand ischemia. No ischemic changes on EKG.  Elevated lipase Lipase of 300 on admission. CT scan was significant for no evidence of acute pancreatitis and patient does not appear to have a clinical presentation is consistent with pancreatitis. -Repeat lipase in a.m.  Prostate cancer Patient follows with Dr. Karsten Ro has an outpatient. History of tumor resection, BCG and Mitomycin.   DVT prophylaxis: Heparin subq Code  Status:   Code Status: DNR Family Communication: Wife and son at bedside Disposition Plan:  Discharge location unknown at this time. Unknown discharge date at this time. Patient is still critically ill   Consultants:   Nephrology  Interventional radiology  Urology  Palliative care medicine  Procedures:   [place] AND FLUOROSCOPIC GUIDED PLACEMENT OF BILATERAL PERCUTANEOUS NEPHROSTOMY  Antimicrobials:  Cefepime    Subjective: Patient not able to tell me much secondary to mental status. Seems to indicate he is in pain, though.  Objective: Vitals:   09/30/19 0833 09/30/19 0905 09/30/19 1100 09/30/19 1200  BP:  (!) 111/94 (!) 106/50   Pulse:  (!) 109 (!) 112 (!) 108  Resp:  18 13 15   Temp: 97.6 F (36.4 C)     TempSrc: Axillary     SpO2:  100% 100% 100%  Weight:        Intake/Output Summary (Last 24 hours) at 09/30/2019 1230 Last data filed at 09/30/2019 1030 Gross per 24 hour  Intake 223.37 ml  Output 1625 ml  Net -1401.63 ml   Filed Weights   09/30/19 0350  Weight: 55.1 kg    Examination:  General exam: Appears calm and comfortable Respiratory system: Clear to auscultation. Respiratory effort normal. Cardiovascular system: S1 & S2 heard, RRR. No murmurs, rubs, gallops or clicks. Left hand fingertips are cyanotic with good radial/ulnar artery pulses Gastrointestinal system: Abdomen is soft and nontender. No organomegaly or masses felt. Normal bowel sounds heard. Central nervous system: Alert and oriented to person. Not answering all questions appropriately and not following commands.  Musculoskeletal: No edema. No calf tenderness Skin: No cyanosis. No rashes Psychiatry: Judgement and insight appear impaired.     Data Reviewed: I have personally reviewed following labs and imaging studies  CBC Lab Results  Component Value Date   WBC 15.4 (H) 09/30/2019   RBC 4.71 09/30/2019   HGB 14.3 09/30/2019   HCT 41.1 09/30/2019   MCV 87.3 09/30/2019   MCH 30.4  09/30/2019   PLT 371 09/30/2019   MCHC 34.8 09/30/2019   RDW 13.5 09/30/2019   LYMPHSABS 0.4 (L) 09/29/2019   MONOABS 0.7 09/29/2019   EOSABS 0.0 09/29/2019   BASOSABS 0.0 25/08/3974     Last metabolic panel Lab Results  Component Value Date   NA 124 (L) 09/30/2019   K 7.0 (HH) 09/30/2019   CL 97 (L) 09/30/2019   CO2 13 (L) 09/30/2019   BUN 217 (H) 09/30/2019   CREATININE 4.99 (H) 09/30/2019   GLUCOSE 113 (H) 09/30/2019   GFRNONAA 7 (L) 09/29/2019   GFRAA 8 (L) 09/29/2019   CALCIUM 9.6 09/30/2019   PHOS 3.1 09/23/2018   PROT 8.0 09/30/2019   ALBUMIN 4.3 09/30/2019   BILITOT 0.9 09/30/2019   ALKPHOS 92 09/30/2019   AST 32 09/30/2019   ALT 29 09/30/2019   ANIONGAP 14.0 09/30/2019    CBG (last 3)  Recent Labs    09/29/19 1512 09/30/19 0845  GLUCAP 108* 110*     GFR: Estimated Creatinine Clearance: 8 mL/min (A) (by C-G formula based on SCr of 4.99 mg/dL (H)).  Coagulation Profile: Recent Labs  Lab 09/29/19 1258  INR 0.9    Recent Results (from the past 240 hour(s))  Culture, blood (routine x 2)     Status: None (Preliminary result)   Collection Time: 09/29/19 12:58 PM   Specimen: BLOOD  Result Value Ref Range Status   Specimen Description   Final    BLOOD LEFT ANTECUBITAL Performed at [facility] [address] [place], [place] [address]    Special  Requests   Final    BOTTLES DRAWN AEROBIC AND ANAEROBIC Blood Culture adequate volume Performed at [facility] [address] [place], [place] [address]    Culture   Final    NO GROWTH < 24 HOURS Performed at [facility] [address] [place], [place] [address]    Report Status PENDING  Incomplete  Urine culture     Status: None   Collection Time: 09/29/19 12:58 PM   Specimen: Urine, Random  Result Value Ref Range Status   Specimen Description   Final    URINE, RANDOM Performed at [facility] [address] [place],  [place] [address]    Special Requests   Final    NONE Performed at [facility], [place] [address] [place], [place] [address]    Culture   Final    NO GROWTH Performed at [facility], [place] [address], [place], [place] [address]    Report Status 09/30/2019 FINAL  Final  Culture, blood (routine x 2)     Status: None (Preliminary result)   Collection Time: 09/29/19  2:47 PM   Specimen: BLOOD  Result Value Ref Range Status   Specimen Description   Final    BLOOD RIGHT ANTECUBITAL Performed at [facility] [address], [place], [place] [address]    Special Requests   Final    BOTTLES DRAWN AEROBIC AND ANAEROBIC Blood Culture adequate volume Performed at [facility] [address] [place], [place] [address]    Culture   Final    NO GROWTH < 24 HOURS Performed at [facility] [address], [place], [place] [address]    Report Status PENDING  Incomplete  SARS Coronavirus 2 by RT PCR (hospital order, performed in [facility] hospital lab) Nasopharyngeal Nasopharyngeal Swab     Status: None   Collection Time: 09/29/19  2:57 PM   Specimen: Nasopharyngeal Swab  Result Value Ref Range Status   SARS Coronavirus 2 NEGATIVE NEGATIVE Final    Comment: (NOTE) SARS-CoV-2 target nucleic acids are NOT DETECTED.  The SARS-CoV-2 RNA is generally detectable in upper and lower respiratory specimens during the acute phase of infection. The lowest concentration of SARS-CoV-2 viral copies this assay can detect is 250 copies / mL. A negative result does not preclude SARS-CoV-2 infection and should not be used as the sole basis for treatment or other patient management decisions.  A negative result may occur with improper specimen collection / handling, submission of specimen other than nasopharyngeal swab, presence of viral mutation(s) within the areas targeted by this assay, and inadequate number of viral copies (<250 copies / mL).  A negative result must be combined with clinical observations, patient history, and epidemiological information.  Fact Sheet for Patients:   StrictlyIdeas.no  Fact Sheet for Healthcare Providers: BankingDealers.co.za  This test is not yet approved or  cleared by the [place] FDA and has been authorized for detection and/or diagnosis of SARS-CoV-2 by FDA under an Emergency Use Authorization (EUA).  This EUA will remain in effect (meaning this test can be used) for the duration of the COVID-19 declaration under Section 564(b)(1) of the Act, 21 U.S.C. section 360bbb-3(b)(1), unless the authorization is terminated or revoked sooner.  Performed at [facility], [place] [address] [place], [place] [address]   MRSA PCR Screening     Status: None   Collection Time: 09/29/19  8:45 PM  Specimen: Nasal Mucosa; Nasopharyngeal  Result Value Ref Range Status   MRSA by PCR NEGATIVE NEGATIVE Final    Comment:        The GeneXpert MRSA Assay (FDA approved for NASAL specimens only), is one component of a comprehensive MRSA colonization surveillance program. It is not intended to diagnose MRSA infection nor to guide or monitor treatment for MRSA infections. Performed at [facility], [place] [address], [place], [place] [address]         Radiology Studies: DG Chest Port 1 View  Result Date: 09/29/2019 CLINICAL DATA:  Cough, weakness EXAM: PORTABLE CHEST 1 VIEW COMPARISON:  08/13/2018 FINDINGS: The heart size and mediastinal contours are within normal limits. Atherosclerotic calcification of the aortic knob. Both lungs are clear. The visualized skeletal structures are unremarkable. IMPRESSION: No active disease. Electronically Signed   By: Davina Poke D.O.   On: 09/29/2019 13:38   CT Renal Stone Study  Result Date: 09/29/2019 CLINICAL DATA:  86 year old male with history of acute renal failure. EXAM:  CT ABDOMEN AND PELVIS WITHOUT CONTRAST TECHNIQUE: Multidetector CT imaging of the abdomen and pelvis was performed following the standard protocol without IV contrast. COMPARISON:  CT the abdomen and pelvis 08/04/2019. FINDINGS: Lower chest: Atherosclerotic calcifications in the descending thoracic aorta as well as the left circumflex and right coronary arteries. Hepatobiliary: No definite suspicious cystic or solid hepatic lesions are confidently identified on today's noncontrast CT examination. Amorphous high attenuation material lying dependently in the gallbladder, likely to reflect biliary sludge and/or tiny noncalcified gallstones. Gallbladder is nearly completely decompressed without surrounding inflammatory changes. Pancreas: No definite pancreatic mass or peripancreatic fluid collections or inflammatory changes are noted on today's noncontrast CT examination. Spleen: Unremarkable. Adrenals/Urinary Tract: No calcifications are identified within the collecting system of either kidney, along the course of either ureter, or within the lumen of the urinary bladder. Mild right renal atrophy. Moderate right and severe left-sided hydroureteronephrosis, similar to the prior examination. 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 Small amount of gas non dependently in the urinary bladder. Urinary bladder is otherwise unremarkable in appearance. Previously noted Foley balloon catheter has been removed. Stomach/Bowel: Unenhanced appearance of the stomach is normal. No pathologic dilatation of small bowel or colon. The appendix is not confidently identified and may be surgically absent. Regardless, there are no inflammatory changes noted adjacent to the cecum to suggest the presence of an acute appendicitis at this time. Vascular/Lymphatic: Aortic atherosclerosis with mild aneurysmal dilatation of the infrarenal abdominal aorta which measures up to 2.9 x 3.1 cm (mean diameter  of 3 cm). No lymphadenopathy noted in the abdomen or pelvis. Reproductive: Postoperative changes of TURP are noted in the prostate gland. Seminal vesicles are unremarkable in appearance. Other: No significant volume of ascites.  No pneumoperitoneum. Musculoskeletal: There are no aggressive appearing lytic or blastic lesions noted in the visualized portions of the skeleton. IMPRESSION: 1. Chronic bilateral hydroureteronephrosis (left greater than right), very similar to the prior examination. No obstructing stone identified. The possibility of distal ureteral stricture should be considered at or near the ureterovesicular junctions. 2. Mild right renal atrophy. 3. Small amount of gas non dependently in the lumen of the urinary bladder. This could be iatrogenic if there has been recent catheterization. Alternatively, correlation with urinalysis is recommended to exclude the possibility of urinary tract infection with gas-forming organisms. 4. Biliary sludge and/or tiny gallstones lying dependently in the gallbladder, without findings to suggest an  acute cholecystitis at this time. 5. Aortic atherosclerosis with mild aneurysmal dilatation of the infrarenal abdominal aorta (3 cm in diameter). Recommend followup by ultrasound in 3 years. This recommendation follows ACR consensus guidelines: White Paper of the ACR Incidental Findings Committee II on Vascular Findings. J Am Coll Radiol 2013; 10:789-794. Electronically Signed   By: Vinnie Langton M.D.   On: 09/29/2019 16:12   IR NEPHROSTOMY PLACEMENT LEFT  Result Date: 09/30/2019 INDICATION: History of bladder cancer, now with obstructive bilateral uropathy and sepsis, post failed attempted placement of bilateral ureteral stents. As such, the patient presents today for image guided placement of bilateral percutaneous nephrostomy catheters for renal preservation and infection source control purposes. EXAM: 1. ULTRASOUND GUIDANCE FOR PUNCTURE OF THE BILATERAL RENAL  COLLECTING SYSTEMS 2. FLUOROSCOPIC GUIDED BILATERAL PERCUTANEOUS NEPHROSTOMY TUBE PLACEMENT. COMPARISON:  CT abdomen pelvis-earlier same day; 08/04/2019 MEDICATIONS: Patient is currently admitted to the hospital receiving intravenous antibiotics; The antibiotic was administered in an appropriate time frame prior to skin puncture. ANESTHESIA/SEDATION: None CONTRAST:  15 mL Isovue 300 - administered into the renal collecting system FLUOROSCOPY TIME:  3 minutes, 18 seconds (33 mGy) COMPLICATIONS: None immediate. PROCEDURE: The procedure, risks, benefits, and alternatives were explained to the patient. Questions regarding the procedure were encouraged and answered. The patient understands and consents to the procedure. A timeout was performed prior to the initiation of the procedure. The bilateral flanks were prepped and draped in the usual sterile fashion and a sterile drape was applied covering the operative field. A sterile gown and sterile gloves were used for the procedure. Local anesthesia was provided with 1% Lidocaine with epinephrine. Beginning with the left kidney, under direct ultrasound guidance, a 20 gauge needle was advanced into the renal collecting system after the subcutaneous tissues were anesthetized with 1% lidocaine with epinephrine. An ultrasound image documentation was performed. Access within the collecting system was confirmed with the efflux of urine followed by limited contrast injection. Over a Nitrex wire, the tract was dilated with an Accustick stent. Next, under intermittent fluoroscopic guidance and over a short Amplatz wire, the track was dilated ultimately allowing placement of a 10-French percutaneous nephrostomy catheter which was advanced to the level of the renal pelvis where the coil was formed and locked. Contrast was injected and several spot fluoroscopic images were obtained in various obliquities. The catheter was secured at the skin with an interrupted prolene retention suture  and stat lock device and connected to a gravity bag was placed. The identical procedure was repeated for the contralateral right kidney ultimately allowing placement of a 10 French percutaneous nephrostomy catheter with end coiled and locked within the right renal pelvis. Note was made to avoid the slightly interposed ascending colon as was demonstrated on preceding noncontrast abdominal CT. Dressings were applied. The patient tolerated the above procedures fairly well without immediate postprocedural complication though had to be restrained the fluoroscopy table due to altered mental status. FINDINGS: Ultrasound scanning demonstrates a moderate to severely dilatation of the bilateral renal collecting systems. Under a combination of ultrasound and fluoroscopic guidance, a posterior inferior calix was targeted allowing placement of a 10-French percutaneous nephrostomy catheter with end coiled and locked within the renal pelvis. Contrast injection confirmed appropriate positioning. IMPRESSION: Successful ultrasound and fluoroscopic guided placement of bilateral percutaneous nephrostomy catheters. Electronically Signed   By: Sandi Mariscal M.D.   On: 09/30/2019 07:52   IR NEPHROSTOMY PLACEMENT RIGHT  Result Date: 09/30/2019 INDICATION: History of bladder cancer, now with obstructive bilateral uropathy and  sepsis, post failed attempted placement of bilateral ureteral stents. As such, the patient presents today for image guided placement of bilateral percutaneous nephrostomy catheters for renal preservation and infection source control purposes. EXAM: 1. ULTRASOUND GUIDANCE FOR PUNCTURE OF THE BILATERAL RENAL COLLECTING SYSTEMS 2. FLUOROSCOPIC GUIDED BILATERAL PERCUTANEOUS NEPHROSTOMY TUBE PLACEMENT. COMPARISON:  CT abdomen pelvis-earlier same day; 08/04/2019 MEDICATIONS: Patient is currently admitted to the hospital receiving intravenous antibiotics; The antibiotic was administered in an appropriate time frame prior to  skin puncture. ANESTHESIA/SEDATION: None CONTRAST:  15 mL Isovue 300 - administered into the renal collecting system FLUOROSCOPY TIME:  3 minutes, 18 seconds (33 mGy) COMPLICATIONS: None immediate. PROCEDURE: The procedure, risks, benefits, and alternatives were explained to the patient. Questions regarding the procedure were encouraged and answered. The patient understands and consents to the procedure. A timeout was performed prior to the initiation of the procedure. The bilateral flanks were prepped and draped in the usual sterile fashion and a sterile drape was applied covering the operative field. A sterile gown and sterile gloves were used for the procedure. Local anesthesia was provided with 1% Lidocaine with epinephrine. Beginning with the left kidney, under direct ultrasound guidance, a 20 gauge needle was advanced into the renal collecting system after the subcutaneous tissues were anesthetized with 1% lidocaine with epinephrine. An ultrasound image documentation was performed. Access within the collecting system was confirmed with the efflux of urine followed by limited contrast injection. Over a Nitrex wire, the tract was dilated with an Accustick stent. Next, under intermittent fluoroscopic guidance and over a short Amplatz wire, the track was dilated ultimately allowing placement of a 10-French percutaneous nephrostomy catheter which was advanced to the level of the renal pelvis where the coil was formed and locked. Contrast was injected and several spot fluoroscopic images were obtained in various obliquities. The catheter was secured at the skin with an interrupted prolene retention suture and stat lock device and connected to a gravity bag was placed. The identical procedure was repeated for the contralateral right kidney ultimately allowing placement of a 10 French percutaneous nephrostomy catheter with end coiled and locked within the right renal pelvis. Note was made to avoid the slightly  interposed ascending colon as was demonstrated on preceding noncontrast abdominal CT. Dressings were applied. The patient tolerated the above procedures fairly well without immediate postprocedural complication though had to be restrained the fluoroscopy table due to altered mental status. FINDINGS: Ultrasound scanning demonstrates a moderate to severely dilatation of the bilateral renal collecting systems. Under a combination of ultrasound and fluoroscopic guidance, a posterior inferior calix was targeted allowing placement of a 10-French percutaneous nephrostomy catheter with end coiled and locked within the renal pelvis. Contrast injection confirmed appropriate positioning. IMPRESSION: Successful ultrasound and fluoroscopic guided placement of bilateral percutaneous nephrostomy catheters. Electronically Signed   By: Sandi Mariscal M.D.   On: 09/30/2019 07:52        Scheduled Meds: . Chlorhexidine Gluconate Cloth  6 each Topical Q0600  . heparin  5,000 Units Subcutaneous Q8H  . mouth rinse  15 mL Mouth Rinse BID  . sodium chloride flush  5 mL Intracatheter Q8H   Continuous Infusions: . ceFEPime (MAXIPIME) IV    . sodium bicarbonate 150 mEq in dextrose 5% 1000 mL    . sodium chloride       LOS: 1 day     Cordelia Poche, MD [facility] 09/30/2019, 12:30 PM  If 7PM-7AM, please contact night-coverage [email or web address]

## 2019-09-30 NOTE — Progress Notes (Signed)
Pharmacy Antibiotic Note  Benjamin Cardenas is a 86 y.o. male admitted on 09/29/2019 with sepsis.  Pharmacy has been consulted for cefepime dosing.  Pt with PMH significant for bladder cancer, sepsis presumed 2/2 to UTI. Pt has PCN allergy, but tolerated a dose of cefepime on 6/25.  Today, 09/30/19 -WBC 16.6 -SCr 6.29, CrCl ~6.5 mL/min -Lactate 1  Plan:  Cefepime 1 g IV q24h  Follow renal function and culture data    Temp (24hrs), Avg:97.3 F (36.3 C), Min:96.6 F (35.9 C), Max:98 F (36.7 C)  Recent Labs  Lab 09/29/19 1258 09/29/19 1500  WBC 16.6*  --   CREATININE 6.29*  --   LATICACIDVEN 1.4 1.0    Estimated Creatinine Clearance: 6.5 mL/min (A) (by C-G formula based on SCr of 6.29 mg/dL (H)).    Allergies  Allergen Reactions  . Amoxicillin Rash    Has patient had a PCN reaction causing immediate rash, facial/tongue/throat swelling, SOB or lightheadedness with hypotension:No Has patient had a PCN reaction causing severe rash involving mucus membranes or skin necrosis:unsure Has patient had a PCN reaction that required hospitalization:No Has patient had a PCN reaction occurring within the last 10 years:unsure If all of the above answers are "NO", then may proceed with Cephalosporin use.   . Ciprofloxacin Swelling and Rash  . Sulfasalazine Swelling  . Sulfa Antibiotics Swelling  . Doxycycline Hyclate Rash  . Levofloxacin Rash    Antimicrobials this admission: cefepime 6/26 >>   Dose adjustments this admission:  Microbiology results: 6/25 BCx:  6/25 UCx:    Thank you for allowing pharmacy to be a part of this patient's care.  Lenis Noon, PharmD 09/30/2019 1:11 AM

## 2019-09-30 NOTE — Progress Notes (Signed)
BP low with MAP <60. Notified on call provider. Given orders for NS bolus. Administering bolus and monitoring patient. Patient has been sleeping since Tylenol given earlier and shows no signs of distress.

## 2019-10-01 DIAGNOSIS — Z7189 Other specified counseling: Secondary | ICD-10-CM

## 2019-10-01 DIAGNOSIS — Z515 Encounter for palliative care: Secondary | ICD-10-CM

## 2019-10-01 DIAGNOSIS — R531 Weakness: Secondary | ICD-10-CM

## 2019-10-01 DIAGNOSIS — I959 Hypotension, unspecified: Secondary | ICD-10-CM

## 2019-10-01 LAB — COMPREHENSIVE METABOLIC PANEL
ALT: 21 U/L (ref 0–44)
AST: 27 U/L (ref 15–41)
Albumin: 3.2 g/dL — ABNORMAL LOW (ref 3.5–5.0)
Alkaline Phosphatase: 68 U/L (ref 38–126)
Anion gap: 10 (ref 5–15)
BUN: 146 mg/dL — ABNORMAL HIGH (ref 8–23)
CO2: 23 mmol/L (ref 22–32)
Calcium: 8.7 mg/dL — ABNORMAL LOW (ref 8.9–10.3)
Chloride: 107 mmol/L (ref 98–111)
Creatinine, Ser: 2.81 mg/dL — ABNORMAL HIGH (ref 0.61–1.24)
GFR calc Af Amer: 22 mL/min — ABNORMAL LOW (ref >60)
GFR calc non Af Amer: 19 mL/min — ABNORMAL LOW (ref >60)
Glucose, Bld: 149 mg/dL — ABNORMAL HIGH (ref 70–99)
Potassium: 4.5 mmol/L (ref 3.5–5.1)
Sodium: 140 mmol/L (ref 135–145)
Total Bilirubin: 0.8 mg/dL (ref 0.3–1.2)
Total Protein: 6.3 g/dL — ABNORMAL LOW (ref 6.5–8.1)

## 2019-10-01 LAB — CBC
HCT: 32.9 % — ABNORMAL LOW (ref 39.0–52.0)
Hemoglobin: 11.7 g/dL — ABNORMAL LOW (ref 13.0–17.0)
MCH: 29.9 pg (ref 26.0–34.0)
MCHC: 35.6 g/dL (ref 30.0–36.0)
MCV: 84.1 fL (ref 80.0–100.0)
Platelets: 271 10*3/uL (ref 150–400)
RBC: 3.91 MIL/uL — ABNORMAL LOW (ref 4.22–5.81)
RDW: 14 % (ref 11.5–15.5)
WBC: 9.1 10*3/uL (ref 4.0–10.5)
nRBC: 0 % (ref 0.0–0.2)

## 2019-10-01 LAB — LACTIC ACID, PLASMA
Lactic Acid, Venous: 1.5 mmol/L (ref 0.5–1.9)
Lactic Acid, Venous: 1.6 mmol/L (ref 0.5–1.9)

## 2019-10-01 LAB — POTASSIUM: Potassium: 5.1 mmol/L (ref 3.5–5.1)

## 2019-10-01 LAB — CORTISOL: Cortisol, Plasma: 27.5 ug/dL

## 2019-10-01 MED ORDER — SODIUM CHLORIDE 0.9 % IV SOLN
250.0000 mL | INTRAVENOUS | Status: DC
  Administered 2019-10-02: 250 mL via INTRAVENOUS

## 2019-10-01 MED ORDER — NOREPINEPHRINE 4 MG/250ML-% IV SOLN: 0.0000 ug/min | INTRAVENOUS | Status: DC

## 2019-10-01 MED ORDER — SODIUM CHLORIDE 0.9 % IV SOLN: INTRAVENOUS | Status: DC

## 2019-10-01 MED ORDER — FENTANYL CITRATE (PF) 100 MCG/2ML IJ SOLN
12.5000 ug | INTRAMUSCULAR | Status: DC | PRN
  Administered 2019-10-01 – 2019-10-02 (×2): 12.5 ug via INTRAVENOUS
  Filled 2019-10-01 (×2): qty 2

## 2019-10-01 MED ORDER — ACETAMINOPHEN 650 MG RE SUPP
650.0000 mg | Freq: Once | RECTAL | Status: AC
  Administered 2019-10-01: 650 mg via RECTAL

## 2019-10-01 MED ORDER — SODIUM CHLORIDE 0.9 % IV SOLN
2.0000 g | INTRAVENOUS | Status: DC
  Administered 2019-10-01: 2 g via INTRAVENOUS
  Filled 2019-10-01: qty 2

## 2019-10-01 MED ORDER — SODIUM CHLORIDE 0.9 % IV BOLUS
1000.0000 mL | Freq: Once | INTRAVENOUS | Status: AC
  Administered 2019-10-01: 1000 mL via INTRAVENOUS

## 2019-10-01 MED ORDER — PHENYLEPHRINE HCL-NACL 10-0.9 MG/250ML-% IV SOLN
25.0000 ug/min | INTRAVENOUS | Status: DC
  Administered 2019-10-02 (×2): 25 ug/min via INTRAVENOUS
  Administered 2019-10-02: 12 ug/min via INTRAVENOUS
  Filled 2019-10-01 (×3): qty 250

## 2019-10-01 NOTE — Progress Notes (Signed)
Urology Inpatient Progress Report  Hydronephrosis [I01.65] Metabolic encephalopathy [V37.48] Acute hyperkalemia [E87.5] AKI (acute kidney injury) ([place]) [O70.7] Toxic metabolic encephalopathy [E67]        Intv/Subj: Patient still somewhat somnolent in the ICU.  Awakens intermittently to ask for ice chips.  Continues with hypotension.  Blood culture negative x2 days.  Urine culture with no growth.  Creatinine continues to improve appropriately and is now 2.81.  Good urine output from nephrostomy tubes bilaterally.  Active Problems:   Hyponatremia   Altered mental status   Bladder cancer (HCC)   AKI (acute kidney injury) ([place])   Hyperkalemia   Toxic metabolic encephalopathy   Sepsis (HCC)   Hypercalcemia   Metabolic acidosis   Elevated troponin  Current Facility-Administered Medications  Medication Dose Route Frequency Provider Last Rate Last Admin  . 0.9 %  sodium chloride infusion   Intravenous Continuous Corliss Parish, MD      . acetaminophen (TYLENOL) tablet 650 mg  650 mg Oral Q6H PRN Oretha Milch D, MD       Or  . acetaminophen (TYLENOL) suppository 650 mg  650 mg Rectal Q6H PRN Oretha Milch D, MD   650 mg at 09/30/19 2210  . ceFEPIme (MAXIPIME) 2 g in sodium chloride 0.9 % 100 mL IVPB  2 g Intravenous Q24H Minda Ditto, RPH      . Chlorhexidine Gluconate Cloth 2 % PADS 6 each  6 each Topical Q0600 Oretha Milch D, MD   6 each at 09/30/19 2210  . heparin injection 5,000 Units  5,000 Units Subcutaneous Q8H Oretha Milch D, MD   5,000 Units at 09/30/19 2209  . MEDLINE mouth rinse  15 mL Mouth Rinse BID Mariel Aloe, MD   15 mL at 09/30/19 2210  . ondansetron (ZOFRAN) tablet 4 mg  4 mg Oral Q6H PRN Oretha Milch D, MD       Or  . ondansetron (ZOFRAN) injection 4 mg  4 mg Intravenous Q6H PRN Oretha Milch D, MD      . senna-docusate (Senokot-S) tablet 1 tablet  1 tablet Oral QHS PRN Oretha Milch D, MD      . sodium chloride 0.9 % bolus 1,000 mL  1,000 mL  Intravenous Once Corliss Parish, MD      . sodium chloride 0.9 % bolus 250 mL  250 mL Intravenous Once Hayden Rasmussen, MD      . sodium chloride flush (NS) 0.9 % injection 5 mL  5 mL Intracatheter Q8H Sandi Mariscal, MD   5 mL at 09/30/19 2211     Objective: Vital: Vitals:   10/01/19 0500 10/01/19 0600 10/01/19 0700 10/01/19 0908  BP: (!) 79/54 (!) 73/44 109/62   Pulse: 97 87 96   Resp: (!) 30 11 20    Temp:    (!) 97.5 F (36.4 C)  TempSrc:    Axillary  SpO2: 100% 100% 100%   Weight:      Height:       I/Os: I/O last 3 completed shifts: In: 3078.4 [I.V.:1863; IV Piggyback:1215.4] Out: 3125 [Urine:3125]  Physical Exam:  General: Somnolent, no acute distress, not oriented Lungs: Normal respiratory effort, chest expands symmetrically. GI: The abdomen is soft and nontender without mass. Bilateral nephrostomy tubes draining clear yellow urine  Lab Results: Recent Labs    09/29/19 1258 09/30/19 0303 10/01/19 0819  WBC 16.6* 15.4* 9.1  HGB 14.9 14.3 11.7*  HCT 42.5 41.1 32.9*   Recent Labs    09/30/19 0303  09/30/19 1236 09/30/19 1550 09/30/19 1550 09/30/19 2125 10/01/19 0111 10/01/19 0502  NA 124*  --  135  --   --   --  140  K 7.0*   < > 5.8*   < > 5.2* 5.1 4.5  CL 97*  --  108  --   --   --  107  CO2 13*  --  16*  --   --   --  23  GLUCOSE 113*  --  147*  --   --   --  149*  BUN 217*  --  154*  --   --   --  146*  CREATININE 4.99*  --  3.84*  --   --   --  2.81*  CALCIUM 9.6  --  9.7  --   --   --  8.7*   < > = values in this interval not displayed.   Recent Labs    09/29/19 1258  INR 0.9   No results for input(s): LABURIN in the last 72 hours. Results for orders placed or performed during the hospital encounter of 09/29/19  Culture, blood (routine x 2)     Status: None (Preliminary result)   Collection Time: 09/29/19 12:58 PM   Specimen: BLOOD  Result Value Ref Range Status   Specimen Description   Final    BLOOD LEFT ANTECUBITAL Performed at  [facility] [address] [place], [place] [address]    Special Requests   Final    BOTTLES DRAWN AEROBIC AND ANAEROBIC Blood Culture adequate volume Performed at [facility] [address] [place], [place] [address]    Culture   Final    NO GROWTH 2 DAYS Performed at [facility] [address] [place], [place] [address]    Report Status PENDING  Incomplete  Urine culture     Status: None   Collection Time: 09/29/19 12:58 PM   Specimen: Urine, Random  Result Value Ref Range Status   Specimen Description   Final    URINE, RANDOM Performed at [facility] [address] [place], [place] [address]    Special Requests   Final    NONE Performed at [facility], [place] [address] [place], [place] [address]    Culture   Final    NO GROWTH Performed at [facility], [place] [address], [place], [place] [address]    Report Status 09/30/2019 FINAL  Final  Culture, blood (routine x 2)     Status: None (Preliminary result)   Collection Time: 09/29/19  2:47 PM   Specimen: BLOOD  Result Value Ref Range Status   Specimen Description   Final    BLOOD RIGHT ANTECUBITAL Performed at [facility] [address] [place], [place] [address]    Special Requests   Final    BOTTLES DRAWN AEROBIC AND ANAEROBIC Blood Culture adequate volume Performed at [facility] [address], [place], [place] [address]    Culture   Final    NO GROWTH 2 DAYS Performed at [facility] [address] [place],  [address]    Report Status PENDING  Incomplete  SARS Coronavirus 2 by RT PCR (hospital order, performed in [facility] hospital lab) Nasopharyngeal Nasopharyngeal Swab     Status: None   Collection Time: 09/29/19  2:57 PM   Specimen: Nasopharyngeal Swab  Result Value Ref Range Status  SARS Coronavirus 2 NEGATIVE NEGATIVE Final    Comment:  (NOTE) SARS-CoV-2 target nucleic acids are NOT DETECTED.  The SARS-CoV-2 RNA is generally detectable in upper and lower respiratory specimens during the acute phase of infection. The lowest concentration of SARS-CoV-2 viral copies this assay can detect is 250 copies / mL. A negative result does not preclude SARS-CoV-2 infection and should not be used as the sole basis for treatment or other patient management decisions.  A negative result may occur with improper specimen collection / handling, submission of specimen other than nasopharyngeal swab, presence of viral mutation(s) within the areas targeted by this assay, and inadequate number of viral copies (<250 copies / mL). A negative result must be combined with clinical observations, patient history, and epidemiological information.  Fact Sheet for Patients:   StrictlyIdeas.no  Fact Sheet for Healthcare Providers: BankingDealers.co.za  This test is not yet approved or  cleared by the [place] FDA and has been authorized for detection and/or diagnosis of SARS-CoV-2 by FDA under an Emergency Use Authorization (EUA).  This EUA will remain in effect (meaning this test can be used) for the duration of the COVID-19 declaration under Section 564(b)(1) of the Act, 21 U.S.C. section 360bbb-3(b)(1), unless the authorization is terminated or revoked sooner.  Performed at [facility], [place] [address], [place], [place] [address]   MRSA PCR Screening     Status: None   Collection Time: 09/29/19  8:45 PM   Specimen: Nasal Mucosa; Nasopharyngeal  Result Value Ref Range Status   MRSA by PCR NEGATIVE NEGATIVE Final    Comment:        The GeneXpert MRSA Assay (FDA approved for NASAL specimens only), is one component of a comprehensive MRSA colonization surveillance program. It is not intended to diagnose MRSA infection nor to guide or monitor treatment for MRSA  infections. Performed at [facility], [place] [address], [place],  [address]     Studies/Results: DG Chest Port 1 View  Result Date: 09/29/2019 CLINICAL DATA:  Cough, weakness EXAM: PORTABLE CHEST 1 VIEW COMPARISON:  08/13/2018 FINDINGS: The heart size and mediastinal contours are within normal limits. Atherosclerotic calcification of the aortic knob. Both lungs are clear. The visualized skeletal structures are unremarkable. IMPRESSION: No active disease. Electronically Signed   By: Davina Poke D.O.   On: 09/29/2019 13:38   CT Renal Stone Study  Result Date: 09/29/2019 CLINICAL DATA:  86 year old male with history of acute renal failure. EXAM: CT ABDOMEN AND PELVIS WITHOUT CONTRAST TECHNIQUE: Multidetector CT imaging of the abdomen and pelvis was performed following the standard protocol without IV contrast. COMPARISON:  CT the abdomen and pelvis 08/04/2019. FINDINGS: Lower chest: Atherosclerotic calcifications in the descending thoracic aorta as well as the left circumflex and right coronary arteries. Hepatobiliary: No definite suspicious cystic or solid hepatic lesions are confidently identified on today's noncontrast CT examination. Amorphous high attenuation material lying dependently in the gallbladder, likely to reflect biliary sludge and/or tiny noncalcified gallstones. Gallbladder is nearly completely decompressed without surrounding inflammatory changes. Pancreas: No definite pancreatic mass or peripancreatic fluid collections or inflammatory changes are noted on today's noncontrast CT examination. Spleen: Unremarkable. Adrenals/Urinary Tract: No calcifications are identified within the collecting system of either kidney, along the course of either ureter, or within the lumen of the urinary bladder. Mild right renal atrophy. Moderate right and severe left-sided hydroureteronephrosis, similar to the prior examination. 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 Small amount of gas non dependently in the urinary bladder. Urinary bladder is otherwise unremarkable in appearance. Previously noted Foley balloon catheter has been removed. Stomach/Bowel: Unenhanced appearance of the stomach is normal. No pathologic dilatation of small bowel or colon. The appendix is not confidently identified and may be surgically absent. Regardless, there are no inflammatory changes noted adjacent to the cecum to suggest the presence of an acute appendicitis at this time. Vascular/Lymphatic: Aortic atherosclerosis with mild aneurysmal dilatation of the infrarenal abdominal aorta which measures up to 2.9 x 3.1 cm (mean diameter of 3 cm). No lymphadenopathy noted in the abdomen or pelvis. Reproductive: Postoperative changes of TURP are noted in the prostate gland. Seminal vesicles are unremarkable in appearance. Other: No significant volume of ascites.  No pneumoperitoneum. Musculoskeletal: There are no aggressive appearing lytic or blastic lesions noted in the visualized portions of the skeleton. IMPRESSION: 1. Chronic bilateral hydroureteronephrosis (left greater than right), very similar to the prior examination. No obstructing stone identified. The possibility of distal ureteral stricture should be considered at or near the ureterovesicular junctions. 2. Mild right renal atrophy. 3. Small amount of gas non dependently in the lumen of the urinary bladder. This could be iatrogenic if there has been recent catheterization. Alternatively, correlation with urinalysis is recommended to exclude the possibility of urinary tract infection with gas-forming organisms. 4. Biliary sludge and/or tiny gallstones lying dependently in the gallbladder, without findings to suggest an acute cholecystitis at this time. 5. Aortic atherosclerosis with mild aneurysmal dilatation of the infrarenal abdominal aorta (3 cm in diameter). Recommend followup by ultrasound in 3 years.  This recommendation follows ACR consensus guidelines: White Paper of the ACR Incidental Findings Committee II on Vascular Findings. J Am Coll Radiol 2013; 10:789-794. Electronically Signed   By: Vinnie Langton M.D.   On: 09/29/2019 16:12   IR NEPHROSTOMY PLACEMENT LEFT  Result Date: 09/30/2019 INDICATION: History of bladder cancer, now with obstructive bilateral uropathy and sepsis, post failed attempted placement of bilateral ureteral stents. As such, the patient presents today for image guided placement of bilateral percutaneous nephrostomy catheters for renal preservation and infection source control purposes. EXAM: 1. ULTRASOUND GUIDANCE FOR PUNCTURE OF THE BILATERAL RENAL COLLECTING SYSTEMS 2. FLUOROSCOPIC GUIDED BILATERAL PERCUTANEOUS NEPHROSTOMY TUBE PLACEMENT. COMPARISON:  CT abdomen pelvis-earlier same day; 08/04/2019 MEDICATIONS: Patient is currently admitted to the hospital receiving intravenous antibiotics; The antibiotic was administered in an appropriate time frame prior to skin puncture. ANESTHESIA/SEDATION: None CONTRAST:  15 mL Isovue 300 - administered into the renal collecting system FLUOROSCOPY TIME:  3 minutes, 18 seconds (33 mGy) COMPLICATIONS: None immediate. PROCEDURE: The procedure, risks, benefits, and alternatives were explained to the patient. Questions regarding the procedure were encouraged and answered. The patient understands and consents to the procedure. A timeout was performed prior to the initiation of the procedure. The bilateral flanks were prepped and draped in the usual sterile fashion and a sterile drape was applied covering the operative field. A sterile gown and sterile gloves were used for the procedure. Local anesthesia was provided with 1% Lidocaine with epinephrine. Beginning with the left kidney, under direct ultrasound guidance, a 20 gauge needle was advanced into the renal collecting system after the subcutaneous tissues were anesthetized with 1% lidocaine with  epinephrine. An ultrasound image documentation was performed. Access within the collecting system was confirmed with the efflux of urine followed by limited contrast injection. Over a Nitrex wire, the tract was dilated with an Accustick stent. Next, under intermittent fluoroscopic guidance  and over a short Amplatz wire, the track was dilated ultimately allowing placement of a 10-French percutaneous nephrostomy catheter which was advanced to the level of the renal pelvis where the coil was formed and locked. Contrast was injected and several spot fluoroscopic images were obtained in various obliquities. The catheter was secured at the skin with an interrupted prolene retention suture and stat lock device and connected to a gravity bag was placed. The identical procedure was repeated for the contralateral right kidney ultimately allowing placement of a 10 French percutaneous nephrostomy catheter with end coiled and locked within the right renal pelvis. Note was made to avoid the slightly interposed ascending colon as was demonstrated on preceding noncontrast abdominal CT. Dressings were applied. The patient tolerated the above procedures fairly well without immediate postprocedural complication though had to be restrained the fluoroscopy table due to altered mental status. FINDINGS: Ultrasound scanning demonstrates a moderate to severely dilatation of the bilateral renal collecting systems. Under a combination of ultrasound and fluoroscopic guidance, a posterior inferior calix was targeted allowing placement of a 10-French percutaneous nephrostomy catheter with end coiled and locked within the renal pelvis. Contrast injection confirmed appropriate positioning. IMPRESSION: Successful ultrasound and fluoroscopic guided placement of bilateral percutaneous nephrostomy catheters. Electronically Signed   By: Sandi Mariscal M.D.   On: 09/30/2019 07:52   IR NEPHROSTOMY PLACEMENT RIGHT  Result Date: 09/30/2019 INDICATION:  History of bladder cancer, now with obstructive bilateral uropathy and sepsis, post failed attempted placement of bilateral ureteral stents. As such, the patient presents today for image guided placement of bilateral percutaneous nephrostomy catheters for renal preservation and infection source control purposes. EXAM: 1. ULTRASOUND GUIDANCE FOR PUNCTURE OF THE BILATERAL RENAL COLLECTING SYSTEMS 2. FLUOROSCOPIC GUIDED BILATERAL PERCUTANEOUS NEPHROSTOMY TUBE PLACEMENT. COMPARISON:  CT abdomen pelvis-earlier same day; 08/04/2019 MEDICATIONS: Patient is currently admitted to the hospital receiving intravenous antibiotics; The antibiotic was administered in an appropriate time frame prior to skin puncture. ANESTHESIA/SEDATION: None CONTRAST:  15 mL Isovue 300 - administered into the renal collecting system FLUOROSCOPY TIME:  3 minutes, 18 seconds (33 mGy) COMPLICATIONS: None immediate. PROCEDURE: The procedure, risks, benefits, and alternatives were explained to the patient. Questions regarding the procedure were encouraged and answered. The patient understands and consents to the procedure. A timeout was performed prior to the initiation of the procedure. The bilateral flanks were prepped and draped in the usual sterile fashion and a sterile drape was applied covering the operative field. A sterile gown and sterile gloves were used for the procedure. Local anesthesia was provided with 1% Lidocaine with epinephrine. Beginning with the left kidney, under direct ultrasound guidance, a 20 gauge needle was advanced into the renal collecting system after the subcutaneous tissues were anesthetized with 1% lidocaine with epinephrine. An ultrasound image documentation was performed. Access within the collecting system was confirmed with the efflux of urine followed by limited contrast injection. Over a Nitrex wire, the tract was dilated with an Accustick stent. Next, under intermittent fluoroscopic guidance and over a short  Amplatz wire, the track was dilated ultimately allowing placement of a 10-French percutaneous nephrostomy catheter which was advanced to the level of the renal pelvis where the coil was formed and locked. Contrast was injected and several spot fluoroscopic images were obtained in various obliquities. The catheter was secured at the skin with an interrupted prolene retention suture and stat lock device and connected to a gravity bag was placed. The identical procedure was repeated for the contralateral right kidney ultimately allowing placement  of a 10 French percutaneous nephrostomy catheter with end coiled and locked within the right renal pelvis. Note was made to avoid the slightly interposed ascending colon as was demonstrated on preceding noncontrast abdominal CT. Dressings were applied. The patient tolerated the above procedures fairly well without immediate postprocedural complication though had to be restrained the fluoroscopy table due to altered mental status. FINDINGS: Ultrasound scanning demonstrates a moderate to severely dilatation of the bilateral renal collecting systems. Under a combination of ultrasound and fluoroscopic guidance, a posterior inferior calix was targeted allowing placement of a 10-French percutaneous nephrostomy catheter with end coiled and locked within the renal pelvis. Contrast injection confirmed appropriate positioning. IMPRESSION: Successful ultrasound and fluoroscopic guided placement of bilateral percutaneous nephrostomy catheters. Electronically Signed   By: Sandi Mariscal M.D.   On: 09/30/2019 07:52    Assessment: Bilateral hydronephrosis Low capacity bladder  Plan: Patient continues to be in critical condition and delirious/somnolent.  Fortunately, creatinine is improving appropriately with nephrostomy tubes.  Appreciate ICU, nephrology, palliative care assistance.  Planning for continued current care followed by home with hospice versus residential hospice.  Unlikely  to be a surgical candidate in the future and so we will likely continue nephrostomy tubes with changes every 3 months rather than try to place an antegrade stent followed by stent changes every 3 months.   Link Snuffer, MD Urology 10/01/2019, 10:32 AM

## 2019-10-01 NOTE — Progress Notes (Signed)
Subjective:  UOP picking up from nephrostomy tubes -  Kidney numbers improving nicely -   Hypotensive-  More awake-  Trying ice chips   Objective Vital signs in last 24 hours: Vitals:   10/01/19 0500 10/01/19 0600 10/01/19 0700 10/01/19 0908  BP: (!) 79/54 (!) 73/44 109/62   Pulse: 97 87 96   Resp: (!) 30 11 20    Temp:    (!) 97.5 F (36.4 C)  TempSrc:    Axillary  SpO2: 100% 100% 100%   Weight:      Height:       Weight change: -2.9 kg  Intake/Output Summary (Last 24 hours) at 10/01/2019 0931 Last data filed at 10/01/2019 5053 Gross per 24 hour  Intake 2855.03 ml  Output 2150 ml  Net 705.03 ml   Assessment/Plan: 86 year old male with bladder cancer and recurrent bladder obstruction issues-has exhausted options except for percutaneous nephrostomy tubes.  He has acute kidney injury and hyperkalemia complicating his obstruction this time 1.Renal- likely mostly secondary to urinary obstruction.  Cannot rule out ATN with presentation of hypotension.  Also received contrast for his abdominal CT.   Appreciate interventional radiology being able to provide percutaneous nephrostomy tubes for patient.  Given that he is DNR, wife had requested hospice referral -  he is not a candidate for any hemodialysis in the interim.  Thankfully is improving from an AKI standpoint.  Getting antibiotics per primary 2. Hypertension/volume  -hypotensive initially, this has improved- then worsened again. Have on bicarb at 100 per hour-   Got bolused overnight-  Only one iv so will change IVF to NS at a high rate after another bolus 3. Anemia  -with hemoglobin close to 15 this argues for him being dry.  Feel free to use IV fluids as above-  Has lowered as expected  4.  Hyperkalemia- due to acute kidney injury due to bladder obstruction.  now normalized   Have changed IVF to NS-  One liter bolus then 150 per hour-  Titrate as needed for UOP and BP.  Since kidney numbers out of the danger range renal will sign off      Louis Meckel    Labs: Basic Metabolic Panel: Recent Labs  Lab 09/30/19 0303 09/30/19 1236 09/30/19 1550 09/30/19 1550 09/30/19 2125 10/01/19 0111 10/01/19 0502  NA 124*  --  135  --   --   --  140  K 7.0*   < > 5.8*   < > 5.2* 5.1 4.5  CL 97*  --  108  --   --   --  107  CO2 13*  --  16*  --   --   --  23  GLUCOSE 113*  --  147*  --   --   --  149*  BUN 217*  --  154*  --   --   --  146*  CREATININE 4.99*  --  3.84*  --   --   --  2.81*  CALCIUM 9.6  --  9.7  --   --   --  8.7*   < > = values in this interval not displayed.   Liver Function Tests: Recent Labs  Lab 09/29/19 1258 09/30/19 0303 10/01/19 0502  AST 32 32 27  ALT 31 29 21   ALKPHOS 109 92 68  BILITOT 0.7 0.9 0.8  PROT 9.4* 8.0 6.3*  ALBUMIN 5.2* 4.3 3.2*   Recent Labs  Lab 09/29/19 1258  LIPASE 323*  No results for input(s): AMMONIA in the last 168 hours. CBC: Recent Labs  Lab 09/29/19 1258 09/30/19 0303 10/01/19 0819  WBC 16.6* 15.4* 9.1  NEUTROABS 15.4*  --   --   HGB 14.9 14.3 11.7*  HCT 42.5 41.1 32.9*  MCV 86.2 87.3 84.1  PLT 421* 371 271   Cardiac Enzymes: No results for input(s): CKTOTAL, CKMB, CKMBINDEX, TROPONINI in the last 168 hours. CBG: Recent Labs  Lab 09/29/19 1512 09/30/19 0845  GLUCAP 108* 110*    Iron Studies: No results for input(s): IRON, TIBC, TRANSFERRIN, FERRITIN in the last 72 hours. Studies/Results: DG Chest Port 1 View  Result Date: 09/29/2019 CLINICAL DATA:  Cough, weakness EXAM: PORTABLE CHEST 1 VIEW COMPARISON:  08/13/2018 FINDINGS: The heart size and mediastinal contours are within normal limits. Atherosclerotic calcification of the aortic knob. Both lungs are clear. The visualized skeletal structures are unremarkable. IMPRESSION: No active disease. Electronically Signed   By: Davina Poke D.O.   On: 09/29/2019 13:38   CT Renal Stone Study  Result Date: 09/29/2019 CLINICAL DATA:  86 year old male with history of acute renal failure.  EXAM: CT ABDOMEN AND PELVIS WITHOUT CONTRAST TECHNIQUE: Multidetector CT imaging of the abdomen and pelvis was performed following the standard protocol without IV contrast. COMPARISON:  CT the abdomen and pelvis 08/04/2019. FINDINGS: Lower chest: Atherosclerotic calcifications in the descending thoracic aorta as well as the left circumflex and right coronary arteries. Hepatobiliary: No definite suspicious cystic or solid hepatic lesions are confidently identified on today's noncontrast CT examination. Amorphous high attenuation material lying dependently in the gallbladder, likely to reflect biliary sludge and/or tiny noncalcified gallstones. Gallbladder is nearly completely decompressed without surrounding inflammatory changes. Pancreas: No definite pancreatic mass or peripancreatic fluid collections or inflammatory changes are noted on today's noncontrast CT examination. Spleen: Unremarkable. Adrenals/Urinary Tract: No calcifications are identified within the collecting system of either kidney, along the course of either ureter, or within the lumen of the urinary bladder. Mild right renal atrophy. Moderate right and severe left-sided hydroureteronephrosis, similar to the prior examination. 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 Small amount of gas non dependently in the urinary bladder. Urinary bladder is otherwise unremarkable in appearance. Previously noted Foley balloon catheter has been removed. Stomach/Bowel: Unenhanced appearance of the stomach is normal. No pathologic dilatation of small bowel or colon. The appendix is not confidently identified and may be surgically absent. Regardless, there are no inflammatory changes noted adjacent to the cecum to suggest the presence of an acute appendicitis at this time. Vascular/Lymphatic: Aortic atherosclerosis with mild aneurysmal dilatation of the infrarenal abdominal aorta which measures up to 2.9 x 3.1 cm (mean  diameter of 3 cm). No lymphadenopathy noted in the abdomen or pelvis. Reproductive: Postoperative changes of TURP are noted in the prostate gland. Seminal vesicles are unremarkable in appearance. Other: No significant volume of ascites.  No pneumoperitoneum. Musculoskeletal: There are no aggressive appearing lytic or blastic lesions noted in the visualized portions of the skeleton. IMPRESSION: 1. Chronic bilateral hydroureteronephrosis (left greater than right), very similar to the prior examination. No obstructing stone identified. The possibility of distal ureteral stricture should be considered at or near the ureterovesicular junctions. 2. Mild right renal atrophy. 3. Small amount of gas non dependently in the lumen of the urinary bladder. This could be iatrogenic if there has been recent catheterization. Alternatively, correlation with urinalysis is recommended to exclude the possibility of urinary tract infection with gas-forming organisms. 4.  Biliary sludge and/or tiny gallstones lying dependently in the gallbladder, without findings to suggest an acute cholecystitis at this time. 5. Aortic atherosclerosis with mild aneurysmal dilatation of the infrarenal abdominal aorta (3 cm in diameter). Recommend followup by ultrasound in 3 years. This recommendation follows ACR consensus guidelines: White Paper of the ACR Incidental Findings Committee II on Vascular Findings. J Am Coll Radiol 2013; 10:789-794. Electronically Signed   By: Vinnie Langton M.D.   On: 09/29/2019 16:12   IR NEPHROSTOMY PLACEMENT LEFT  Result Date: 09/30/2019 INDICATION: History of bladder cancer, now with obstructive bilateral uropathy and sepsis, post failed attempted placement of bilateral ureteral stents. As such, the patient presents today for image guided placement of bilateral percutaneous nephrostomy catheters for renal preservation and infection source control purposes. EXAM: 1. ULTRASOUND GUIDANCE FOR PUNCTURE OF THE BILATERAL  RENAL COLLECTING SYSTEMS 2. FLUOROSCOPIC GUIDED BILATERAL PERCUTANEOUS NEPHROSTOMY TUBE PLACEMENT. COMPARISON:  CT abdomen pelvis-earlier same day; 08/04/2019 MEDICATIONS: Patient is currently admitted to the hospital receiving intravenous antibiotics; The antibiotic was administered in an appropriate time frame prior to skin puncture. ANESTHESIA/SEDATION: None CONTRAST:  15 mL Isovue 300 - administered into the renal collecting system FLUOROSCOPY TIME:  3 minutes, 18 seconds (33 mGy) COMPLICATIONS: None immediate. PROCEDURE: The procedure, risks, benefits, and alternatives were explained to the patient. Questions regarding the procedure were encouraged and answered. The patient understands and consents to the procedure. A timeout was performed prior to the initiation of the procedure. The bilateral flanks were prepped and draped in the usual sterile fashion and a sterile drape was applied covering the operative field. A sterile gown and sterile gloves were used for the procedure. Local anesthesia was provided with 1% Lidocaine with epinephrine. Beginning with the left kidney, under direct ultrasound guidance, a 20 gauge needle was advanced into the renal collecting system after the subcutaneous tissues were anesthetized with 1% lidocaine with epinephrine. An ultrasound image documentation was performed. Access within the collecting system was confirmed with the efflux of urine followed by limited contrast injection. Over a Nitrex wire, the tract was dilated with an Accustick stent. Next, under intermittent fluoroscopic guidance and over a short Amplatz wire, the track was dilated ultimately allowing placement of a 10-French percutaneous nephrostomy catheter which was advanced to the level of the renal pelvis where the coil was formed and locked. Contrast was injected and several spot fluoroscopic images were obtained in various obliquities. The catheter was secured at the skin with an interrupted prolene retention  suture and stat lock device and connected to a gravity bag was placed. The identical procedure was repeated for the contralateral right kidney ultimately allowing placement of a 10 French percutaneous nephrostomy catheter with end coiled and locked within the right renal pelvis. Note was made to avoid the slightly interposed ascending colon as was demonstrated on preceding noncontrast abdominal CT. Dressings were applied. The patient tolerated the above procedures fairly well without immediate postprocedural complication though had to be restrained the fluoroscopy table due to altered mental status. FINDINGS: Ultrasound scanning demonstrates a moderate to severely dilatation of the bilateral renal collecting systems. Under a combination of ultrasound and fluoroscopic guidance, a posterior inferior calix was targeted allowing placement of a 10-French percutaneous nephrostomy catheter with end coiled and locked within the renal pelvis. Contrast injection confirmed appropriate positioning. IMPRESSION: Successful ultrasound and fluoroscopic guided placement of bilateral percutaneous nephrostomy catheters. Electronically Signed   By: Sandi Mariscal M.D.   On: 09/30/2019 07:52   IR NEPHROSTOMY PLACEMENT RIGHT  Result Date: 09/30/2019 INDICATION: History of bladder cancer, now with obstructive bilateral uropathy and sepsis, post failed attempted placement of bilateral ureteral stents. As such, the patient presents today for image guided placement of bilateral percutaneous nephrostomy catheters for renal preservation and infection source control purposes. EXAM: 1. ULTRASOUND GUIDANCE FOR PUNCTURE OF THE BILATERAL RENAL COLLECTING SYSTEMS 2. FLUOROSCOPIC GUIDED BILATERAL PERCUTANEOUS NEPHROSTOMY TUBE PLACEMENT. COMPARISON:  CT abdomen pelvis-earlier same day; 08/04/2019 MEDICATIONS: Patient is currently admitted to the hospital receiving intravenous antibiotics; The antibiotic was administered in an appropriate time frame  prior to skin puncture. ANESTHESIA/SEDATION: None CONTRAST:  15 mL Isovue 300 - administered into the renal collecting system FLUOROSCOPY TIME:  3 minutes, 18 seconds (33 mGy) COMPLICATIONS: None immediate. PROCEDURE: The procedure, risks, benefits, and alternatives were explained to the patient. Questions regarding the procedure were encouraged and answered. The patient understands and consents to the procedure. A timeout was performed prior to the initiation of the procedure. The bilateral flanks were prepped and draped in the usual sterile fashion and a sterile drape was applied covering the operative field. A sterile gown and sterile gloves were used for the procedure. Local anesthesia was provided with 1% Lidocaine with epinephrine. Beginning with the left kidney, under direct ultrasound guidance, a 20 gauge needle was advanced into the renal collecting system after the subcutaneous tissues were anesthetized with 1% lidocaine with epinephrine. An ultrasound image documentation was performed. Access within the collecting system was confirmed with the efflux of urine followed by limited contrast injection. Over a Nitrex wire, the tract was dilated with an Accustick stent. Next, under intermittent fluoroscopic guidance and over a short Amplatz wire, the track was dilated ultimately allowing placement of a 10-French percutaneous nephrostomy catheter which was advanced to the level of the renal pelvis where the coil was formed and locked. Contrast was injected and several spot fluoroscopic images were obtained in various obliquities. The catheter was secured at the skin with an interrupted prolene retention suture and stat lock device and connected to a gravity bag was placed. The identical procedure was repeated for the contralateral right kidney ultimately allowing placement of a 10 French percutaneous nephrostomy catheter with end coiled and locked within the right renal pelvis. Note was made to avoid the  slightly interposed ascending colon as was demonstrated on preceding noncontrast abdominal CT. Dressings were applied. The patient tolerated the above procedures fairly well without immediate postprocedural complication though had to be restrained the fluoroscopy table due to altered mental status. FINDINGS: Ultrasound scanning demonstrates a moderate to severely dilatation of the bilateral renal collecting systems. Under a combination of ultrasound and fluoroscopic guidance, a posterior inferior calix was targeted allowing placement of a 10-French percutaneous nephrostomy catheter with end coiled and locked within the renal pelvis. Contrast injection confirmed appropriate positioning. IMPRESSION: Successful ultrasound and fluoroscopic guided placement of bilateral percutaneous nephrostomy catheters. Electronically Signed   By: Sandi Mariscal M.D.   On: 09/30/2019 07:52   Medications: Infusions: . ceFEPime (MAXIPIME) IV    . sodium bicarbonate 150 mEq in dextrose 5% 1000 mL 100 mL/hr at 10/01/19 0650  . sodium chloride      Scheduled Medications: . Chlorhexidine Gluconate Cloth  6 each Topical Q0600  . heparin  5,000 Units Subcutaneous Q8H  . mouth rinse  15 mL Mouth Rinse BID  . sodium chloride flush  5 mL Intracatheter Q8H    have reviewed scheduled and prn medications.  Physical Exam: General: thin- more alert Heart: tachy Lungs: mostly  clear Abdomen: soft, non tender Extremities: no edema bilat neph tubes-  Clear urine draining     10/01/2019,9:31 AM  LOS: 2 days

## 2019-10-01 NOTE — Progress Notes (Addendum)
I met w/Mrs. Laye outside of pt's room. We had an extended visit. She enjoyed talking about family, their marriage, and their faith. She is step-mom to his children but she said they want her to call her her children. We talked about Tam's journey of rapid decline over the past two weeks. She was tearful in talking about his goals of care.  She explained that they (staff) have talked about palliative care and hospice. She said, "I guess we are headed that way."  She was accepting of that and what it looks like. She expressed concern for him coming home and possibly having an incident swallowing ice chips. She seems to need clarity about hospice and palliative care and how much support. When the nurse stopped by to change shifts she was able to speak with her briefly about that. I provided empathic listening support for which she was very grateful. By the conclusion of our conversation she seemed to be more settled about the next steps that she had discussed w/staff.  From a spiritual perspective she talked about her husband going to be with his other family members mentioning his father, mother and sister.  Pamala Hurry is also a close friend of my colleague Valetta Fuller Claussin.  Pamala Hurry mentioned also possibly needed Chaplain support to her son. I encouraged to contact our office if needed. Please page if additional support is needed. [place], MDiv

## 2019-10-01 NOTE — Evaluation (Signed)
Clinical/Bedside Swallow Evaluation Patient Details  Name: Benjamin Cardenas MRN: [id number] Date of Birth: July 01, 1929  Today's Date: 10/01/2019 Time: SLP Start Time (ACUTE ONLY): 1400 SLP Stop Time (ACUTE ONLY): 1430 SLP Time Calculation (min) (ACUTE ONLY): 30 min  Past Medical History:  Past Medical History:  Diagnosis Date   BPH with urinary obstruction    Chronic constipation    Chronic kidney disease    hydtronephrosis   Chronic low back pain    Cognitive impairment    Dry skin dermatitis    Elevated PSA    Foley catheter in place    July 14, 2019   History of bladder cancer    urologist--- dr Consuella Lose   History of colon polyps    History of sepsis    previous admission's for sepsis due to UTI's and CAP:   last admission's 05/ 2020 and 06/ 2020 sepsis secondary to UTI   History of supraventricular tachycardia 11-03-2018  pt denies any symptoms since 2018   03/ 2018 during admission for CAP with sepsis-- episode of SVT;  pt cardiac evaluation after discharge 08-17-2016 (note in epic, b. bhagat PA),  normal echo,  event montior no sig. arrrhythmias heart rate range 36-120   IDA (iron deficiency anemia)    Lower urinary tract symptoms (LUTS)    Lumbar stenosis    OA (osteoarthritis)    Prostate nodule    Seasonal allergic rhinitis    Urinary retention    Wears glasses    Past Surgical History:  Past Surgical History:  Procedure Laterality Date   CATARACT EXTRACTION W/ INTRAOCULAR LENS  IMPLANT, BILATERAL  09/2017   CYSTOSCOPY WITH BIOPSY N/A 04/22/2015   Procedure: CYSTOSCOPY WITH BLADDER BIOPSY;  Surgeon: Kathie Rhodes, MD;  Location: [facility];  Service: Urology;  Laterality: N/A;   CYSTOSCOPY WITH RETROGRADE PYELOGRAM, URETEROSCOPY AND STENT PLACEMENT Left 02/25/2015   Procedure: LEFT RETROGRADE PYELOGRAM, URETEROSCOPY ;  Surgeon: Kathie Rhodes, MD;  Location: [facility];  Service: Urology;  Laterality: Left;    CYSTOSCOPY WITH URETEROSCOPY AND STENT PLACEMENT N/A 08/28/2019   Procedure: CYSTOSCOPY;  Surgeon: Kathie Rhodes, MD;  Location: [facility];  Service: Urology;  Laterality: N/A;   HERNIA REPAIR  age 3 1937   rupture right inguinal hernia   INGUINAL HERNIA REPAIR  03/02/2011   Procedure: HERNIA REPAIR INGUINAL ADULT;  Surgeon: Earnstine Regal, MD;  Location: [facility];  Service: General;  Laterality: Left;  with mesh    IR NEPHROSTOMY PLACEMENT LEFT  09/29/2019   IR NEPHROSTOMY PLACEMENT RIGHT  09/29/2019   TRANSURETHRAL RESECTION OF BLADDER TUMOR WITH GYRUS (TURBT-GYRUS) N/A 02/25/2015   Procedure: TRANSURETHRAL RESECTION OF BLADDER TUMOR WITH GYRUS (TURBT-GYRUS);  Surgeon: Kathie Rhodes, MD;  Location: [facility];  Service: Urology;  Laterality: N/A;   TRANSURETHRAL RESECTION OF PROSTATE N/A 11/07/2018   Procedure: TRANSURETHRAL RESECTION OF THE PROSTATE (TURP)/ BLADDER BIOPSY;  Surgeon: Kathie Rhodes, MD;  Location: [facility];  Service: Urology;  Laterality: N/A;   HPI:  Benjamin Cardenas is a 86 y.o. male with medical history significant for bladder cancer with multiple episodes of urinary retention with known bilateral hydronephrosis despite TURP (11/07/2018), swelling with Foley catheter (09/20/2018) and unsuccessful attempt stent placement on 08/28/2019 by Dr. Karsten Ro due to inflamed bladder.  He presented on 09/29/2019 with reports of worsening confusion, decreased urination for the past week as reported by wife.  Patient is unable to provide any history as he's  only alert to self.  Aggressive management of his bladder inflammation was attempted to prevent further obstruction and potentially avoid bilateral nephrostomy tube placement.   He was seen by his PCP on 09/19/2019 and was in his usual state of health at that time with only complaint by wife being 15 pound weight loss over the course of 8 months.  His wife stated over the past 4 days he has had  decreased ability to control his urine, inability to walk on his own, requiring more assistance from home nursing.  Given decline in his care she asked for a referral to hospice from her PCP.  He was found to be septic most likely from UTI, with AKI with significant hyperkalemia and metabolic acidosis, toxic metabolic encephalopathy, hypotension and hyponatremia suspect hypovolemic.  Most recent chest xray was showing no active disease.     Assessment / Plan / Recommendation Clinical Impression  Clinical swallowing evaluation was completed using ice chips, thin liquids via spoon, cup and straw and pureed material.  Per RN patient was noted to cough with intake of thin liquids.  The patient's wife reported belching with intake at home.  Limited cranial  nerve exam completed due to the patient's difficulty following commands. He is currently confused and easily agitated.  Obvious issues were not noted, however, limited assessment.  He presented with a possible oropharyngeal dysphagia.  At times, he had decreased bolus awareness, and required cues to clear material from his oral cavity.  Swallow trigger was appreciated to palpation.  Patient was noted to have cough response following liquids via cup and straw.  Coughing was not noted for other bolus textures, however, the patient is currently very confused increasing risk for aspiration.  Recommend that he remain NPO except ice chips with good oral care.  ST will follow up next date to assess for PO readiness vs need for possible instrumental exam.  ` SLP Visit Diagnosis: Dysphagia, unspecified (R13.10)    Aspiration Risk  Moderate aspiration risk    Diet Recommendation   NPO except ice chips  Medication Administration: Via alternative means    Other  Recommendations Oral Care Recommendations: Oral care QID   Follow up Recommendations Other (comment) (TBD)      Frequency and Duration min 2x/week  2 weeks       Prognosis Prognosis for Safe Diet  Advancement: Fair Barriers to Reach Goals: Cognitive deficits      Swallow Study   General Date of Onset: 09/29/19 HPI: Benjamin Cardenas is a 86 y.o. male with medical history significant for bladder cancer with multiple episodes of urinary retention with known bilateral hydronephrosis despite TURP (11/07/2018), swelling with Foley catheter (09/20/2018) and unsuccessful attempt stent placement on 08/28/2019 by Dr. Karsten Ro due to inflamed bladder.  He presented on 09/29/2019 with reports of worsening confusion, decreased urination for the past week as reported by wife.  Patient is unable to provide any history as he's only alert to self.  Aggressive management of his bladder inflammation was attempted to prevent further obstruction and potentially avoid bilateral nephrostomy tube placement.   He was seen by his PCP on 09/19/2019 and was in his usual state of health at that time with only complaint by wife being 15 pound weight loss over the course of 8 months.  His wife stated over the past 4 days he has had decreased ability to control his urine, inability to walk on his own, requiring more assistance from home nursing.  Given decline in his  care she asked for a referral to hospice from her PCP.  He was found to be septic most likely from UTI, with AKI with significant hyperkalemia and metabolic acidosis, toxic metabolic encephalopathy, hypotension and hyponatremia suspect hypovolemic.  Most recent chest xray was showing no active disease.   Type of Study: Bedside Swallow Evaluation Previous Swallow Assessment: None noted at [facility] Diet Prior to this Study: NPO Temperature Spikes Noted: No Respiratory Status: Room air History of Recent Intubation: No Behavior/Cognition: Alert;Agitated;Confused;Requires cueing;Doesn't follow directions Oral Cavity Assessment: Within Functional Limits Oral Care Completed by SLP: No Oral Cavity - Dentition: Adequate natural dentition Vision: Functional for  self-feeding Self-Feeding Abilities: Total assist Patient Positioning: Upright in bed Baseline Vocal Quality: Low vocal intensity;Hoarse Volitional Cough: Cognitively unable to elicit Volitional Swallow: Able to elicit    Oral/Motor/Sensory Function Overall Oral Motor/Sensory Function: Other (comment) (unable to assess)   Ice Chips Ice chips: Impaired Presentation: Spoon Pharyngeal Phase Impairments: Suspected delayed Swallow   Thin Liquid Thin Liquid: Impaired Presentation: Spoon;Cup;Self Fed Pharyngeal  Phase Impairments: Suspected delayed Swallow;Cough - Immediate    Nectar Thick Nectar Thick Liquid: Not tested   Honey Thick Honey Thick Liquid: Not tested   Puree Puree: Impaired Presentation: Spoon Oral Phase Impairments: Impaired mastication Oral Phase Functional Implications: Prolonged oral transit Pharyngeal Phase Impairments: Suspected delayed Swallow   Solid     Solid: Not tested     Shelly Flatten, MA, CCC-SLP Acute Rehab SLP [phone]  Benjamin Cardenas 10/01/2019,2:46 PM

## 2019-10-01 NOTE — Consult Note (Signed)
NAME:  Benjamin Cardenas, MRN:  [id number], DOB:  1929/06/18, LOS: 2 ADMISSION DATE:  09/29/2019, CONSULTATION DATE:  10/01/19 REFERRING MD:  Dr Gardenia Phlegm, CHIEF COMPLAINT:  uro sepsis   Brief History   x  History of present illness   86 year old male with a history of bladder cancer with multiple episodes of urinary retention and known bilateral hydronephrosis despite August 2020 TURP and indwelling Foley catheter since June 2020 and unsuccessful attempt at stent placement in May 2021 for inflamed bladder.  He presented on September 29, 2019 with worsening delirium decreased urination for 1 week.  Prior to that he was somewhat ambulatory and oriented other than forgetfulness.  He was admitted with SIRS picture with a T-max 96.6 tachycardic and hypotensive but responded to fluids.  CT renal stone showed chronic bilateral hydronephrosis left greater than right.He was in renal failure to creatinine of 6.3 mg percent [baseline 1.7 mg percent] and hyperkalemic with potassium 80 and hyponatremic sodium 122.  Hospitalist admitted the patient.  Since then patient has been seen byInterventional radiology, renal and urology and is noticing continued delirium.  On and off hypotension requiring fluids but responsive time to fluid.  His renal failure is improved to creatinine of 2.8 mg percent.  Status post bilateral nephrostomy tubes resulting in improvement.Urology does not think he is a good surgical candidate or even for stent placement/ But instead has opted for permanent nephrostomy tubes were changed every 3 months  His delirium is also improved but still persist.  He appears to have on and off hypotension requiring fluid boluses.  Hospitalist is concerned that he might need pressors and therefore his call pulmonary consult critical care consult.  Patient is no CPR no intubation but continue medical care.   Past Medical History     has a past medical history of BPH with urinary obstruction, Chronic  constipation, Chronic kidney disease, Chronic low back pain, Cognitive impairment, Dry skin dermatitis, Elevated PSA, Foley catheter in place, History of bladder cancer, History of colon polyps, History of sepsis, History of supraventricular tachycardia (11-03-2018  pt denies any symptoms since 2018), IDA (iron deficiency anemia), Lower urinary tract symptoms (LUTS), Lumbar stenosis, OA (osteoarthritis), Prostate nodule, Seasonal allergic rhinitis, Urinary retention, and Wears glasses.   reports that he quit smoking about 40 years ago. His smoking use included cigarettes. He has a 51.00 pack-year smoking history. He has never used smokeless tobacco.  Past Surgical History:  Procedure Laterality Date  . CATARACT EXTRACTION W/ INTRAOCULAR LENS  IMPLANT, BILATERAL  09/2017  . CYSTOSCOPY WITH BIOPSY N/A 04/22/2015   Procedure: CYSTOSCOPY WITH BLADDER BIOPSY;  Surgeon: Kathie Rhodes, MD;  Location: [facility];  Service: Urology;  Laterality: N/A;  . CYSTOSCOPY WITH RETROGRADE PYELOGRAM, URETEROSCOPY AND STENT PLACEMENT Left 02/25/2015   Procedure: LEFT RETROGRADE PYELOGRAM, URETEROSCOPY ;  Surgeon: Kathie Rhodes, MD;  Location: [facility];  Service: Urology;  Laterality: Left;  . CYSTOSCOPY WITH URETEROSCOPY AND STENT PLACEMENT N/A 08/28/2019   Procedure: CYSTOSCOPY;  Surgeon: Kathie Rhodes, MD;  Location: [facility];  Service: Urology;  Laterality: N/A;  . HERNIA REPAIR  age 21 1937   rupture right inguinal hernia  . INGUINAL HERNIA REPAIR  03/02/2011   Procedure: HERNIA REPAIR INGUINAL ADULT;  Surgeon: Earnstine Regal, MD;  Location: [facility];  Service: General;  Laterality: Left;  with mesh   . IR NEPHROSTOMY PLACEMENT LEFT  09/29/2019  . IR NEPHROSTOMY PLACEMENT RIGHT  09/29/2019  .  TRANSURETHRAL RESECTION OF BLADDER TUMOR WITH GYRUS (TURBT-GYRUS) N/A 02/25/2015   Procedure: TRANSURETHRAL RESECTION OF BLADDER TUMOR WITH GYRUS (TURBT-GYRUS);  Surgeon: Kathie Rhodes, MD;  Location: [facility];  Service: Urology;  Laterality: N/A;  . TRANSURETHRAL RESECTION OF PROSTATE N/A 11/07/2018   Procedure: TRANSURETHRAL RESECTION OF THE PROSTATE (TURP)/ BLADDER BIOPSY;  Surgeon: Kathie Rhodes, MD;  Location: [place];  Service: Urology;  Laterality: N/A;    Allergies  Allergen Reactions  . Amoxicillin Rash    Has patient had a PCN reaction causing immediate rash, facial/tongue/throat swelling, SOB or lightheadedness with hypotension:No Has patient had a PCN reaction causing severe rash involving mucus membranes or skin necrosis:unsure Has patient had a PCN reaction that required hospitalization:No Has patient had a PCN reaction occurring within the last 10 years:unsure If all of the above answers are "NO", then may proceed with Cephalosporin use.   . Ciprofloxacin Swelling and Rash  . Sulfasalazine Swelling  . Sulfa Antibiotics Swelling  . Doxycycline Hyclate Rash  . Levofloxacin Rash    Immunization History  Administered Date(s) Administered  . Fluad Quad(high Dose 65+) 01/17/2019  . Influenza Split 01/28/2011, 02/03/2012  . Influenza Whole 04/07/1999, 02/15/2007, 01/19/2008, 01/31/2009, 01/30/2010  . Influenza, High Dose Seasonal PF 01/18/2013, 02/04/2015, 12/31/2015, 01/06/2018  . Influenza,inj,Quad PF,6+ Mos 01/24/2014  . Influenza-Unspecified 01/05/2017, 01/17/2019  . PFIZER SARS-COV-2 Vaccination 04/27/2019, 05/18/2019  . Pneumococcal Conjugate-13 09/25/2013  . Pneumococcal Polysaccharide-23 12/05/2001  . Td 01/04/1997, 07/22/2007, 03/24/2018  . Zoster 05/05/2005  . Zoster Recombinat (Shingrix) 03/10/2018, 05/16/2018    Family History  Problem Relation Age of Onset  . Heart disease Mother   . Heart disease Father   . Cancer Sister        breast  . Cancer Brother        colon     Current Facility-Administered Medications:  .  0.9 %  sodium chloride infusion, , Intravenous, Continuous,  Corliss Parish, MD .  0.9 %  sodium chloride infusion, 250 mL, Intravenous, Continuous, Nettey, Evalee Jefferson, MD .  acetaminophen (TYLENOL) tablet 650 mg, 650 mg, Oral, Q6H PRN **OR** acetaminophen (TYLENOL) suppository 650 mg, 650 mg, Rectal, Q6H PRN, Oretha Milch D, MD, 650 mg at 09/30/19 2210 .  ceFEPIme (MAXIPIME) 2 g in sodium chloride 0.9 % 100 mL IVPB, 2 g, Intravenous, Q24H, Green, Terri L, RPH .  Chlorhexidine Gluconate Cloth 2 % PADS 6 each, 6 each, Topical, Q0600, Desiree Hane, MD, 6 each at 09/30/19 2210 .  fentaNYL (SUBLIMAZE) injection 12.5 mcg, 12.5 mcg, Intravenous, Q3H PRN, Loistine Chance, MD .  heparin injection 5,000 Units, 5,000 Units, Subcutaneous, Q8H, Oretha Milch D, MD, 5,000 Units at 09/30/19 2209 .  MEDLINE mouth rinse, 15 mL, Mouth Rinse, BID, Mariel Aloe, MD, 15 mL at 09/30/19 2210 .  ondansetron (ZOFRAN) tablet 4 mg, 4 mg, Oral, Q6H PRN **OR** ondansetron (ZOFRAN) injection 4 mg, 4 mg, Intravenous, Q6H PRN, Lonny Prude, Shayla D, MD .  phenylephrine (NEOSYNEPHRINE) 10-0.9 MG/250ML-% infusion, 25-200 mcg/min, Intravenous, Titrated, Nettey, Evalee Jefferson, MD .  senna-docusate (Senokot-S) tablet 1 tablet, 1 tablet, Oral, QHS PRN, Oretha Milch D, MD .  [COMPLETED] sodium chloride 0.9 % bolus 1,000 mL, 1,000 mL, Intravenous, Once, Stopping Infusion hung by another clincian at 09/29/19 2307 **AND** [COMPLETED] sodium chloride 0.9 % bolus 500 mL, 500 mL, Intravenous, Once, Stopped at 09/29/19 1427 **AND** sodium chloride 0.9 % bolus 250 mL, 250 mL, Intravenous, Once, Hayden Rasmussen, MD .  sodium  chloride flush (NS) 0.9 % injection 5 mL, 5 mL, Intracatheter, Q8H, Sandi Mariscal, MD, 5 mL at 09/30/19 [facility] Events   09/29/2019 - admit, renal consult, IR consult,  Urology consult 6/26 - pall consult 6/27 - ccm consult  Consults:  above  Procedures:  6/25 - Successful [place] and fluoroscopic guided placement of bilateral PCNs with ends coiled and locked  within the bilateral renal pelves. Both PCNs connected to gravity bags  Significant Diagnostic Tests:  X  Micro Data:  sarcs  - neg mrsa pcr 6/25- neg Urine culture 6/25 - neg bc 6/25 -   Antimicrobials:   Cefepime 6/25 -     Interim history/subjective:  Seen in bed 1236  Objective   Blood pressure 109/62, pulse 96, temperature 97.6 F (36.4 C), temperature source Axillary, resp. rate 20, height 5\' 6"  (1.676 m), weight 52.2 kg, SpO2 100 %.        Intake/Output Summary (Last 24 hours) at 10/01/2019 1307 Last data filed at 10/01/2019 0650 Gross per 24 hour  Intake 2855.03 ml  Output 1500 ml  Net 1355.03 ml   Filed Weights   09/30/19 0350 10/01/19 0232  Weight: 55.1 kg 52.2 kg    Examination: Extremely frail cachectic male.  Currently somewhat sleepy.  Wife reports that he mumbles and confusion.  No respiratory distress abdomen scaphoid normal heart sounds.  Overall emaciated and cachectic.  Resolved Hospital Problem list   x Assessment & Plan:   Hypertension in the setting of cachexia, bladder cancer and also postobstructive renal failure -.  Additional factors include Likely dehydration playing a role Possible sepsis playing a role Possible relative adrenal insufficiency playing a role  So far volume responsive as evidenced by blood pressure response and creatinine improving  Plan -Mean arterial pressure goal greater than 60 with systolic blood pressure greater than 90 -Check random cortisol and based on results decide on hydrocortisone -Neo-Synephrine if needed -Otherwise fluid boluses  Best practice:  DNR but full medical care.  Communication according to the hospitalist      ATTESTATION & SIGNATURE   The patient Benjamin Cardenas is critically ill with multiple organ systems failure and requires high complexity decision making for assessment and support, frequent evaluation and titration of therapies, application of advanced monitoring technologies and  extensive interpretation of multiple databases.   Critical Care Time devoted to patient care services described in this note is  30  Minutes. This time reflects time of care of this signee Dr Brand Males. This critical care time does not reflect procedure time, or teaching time or supervisory time of PA/NP/Med student/Med Resident etc but could involve care discussion time     Dr. Brand Males, M.D., [facility].C.P Pulmonary and Critical Care Medicine Staff Physician [place] [facility] Pager: [phone], If no answer or between  15:00h - 7:00h: call [phone]  10/01/2019 1:07 PM    LABS    PULMONARY No results for input(s): PHART, PCO2ART, PO2ART, HCO3, TCO2, O2SAT in the last 168 hours.  Invalid input(s): PCO2, PO2  CBC Recent Labs  Lab 09/29/19 1258 09/30/19 0303 10/01/19 0819  HGB 14.9 14.3 11.7*  HCT 42.5 41.1 32.9*  WBC 16.6* 15.4* 9.1  PLT 421* 371 271    COAGULATION Recent Labs  Lab 09/29/19 1258  INR 0.9    CARDIAC  No results for input(s): TROPONINI in the last 168 hours. No results for input(s): PROBNP in  the last 168 hours.   CHEMISTRY Recent Labs  Lab 09/29/19 1258 09/29/19 1258 09/29/19 1500 09/30/19 0303 09/30/19 1236 09/30/19 1550 09/30/19 2125 10/01/19 0111 10/01/19 0502  NA 122*  --   --  124*  --  135  --   --  140  K 8.0*   < >  --  7.0*   < > 5.8*   < > 5.1 4.5  CL 89*  --   --  97*  --  108  --   --  107  CO2 16*  --   --  13*  --  16*  --   --  23  GLUCOSE 150*  --  100* 113*  --  147*  --   --  149*  BUN 228*  --   --  217*  --  154*  --   --  146*  CREATININE 6.29*  --   --  4.99*  --  3.84*  --   --  2.81*  CALCIUM 10.4*  --   --  9.6  --  9.7  --   --  8.7*   < > = values in this interval not displayed.   Estimated Creatinine Clearance: 13.4 mL/min (A) (by C-G formula based on SCr of 2.81 mg/dL (H)).   LIVER Recent Labs  Lab 09/29/19 1258 09/30/19 0303 10/01/19 0502    AST 32 32 27  ALT 31 29 21   ALKPHOS 109 92 68  BILITOT 0.7 0.9 0.8  PROT 9.4* 8.0 6.3*  ALBUMIN 5.2* 4.3 3.2*  INR 0.9  --   --      INFECTIOUS Recent Labs  Lab 09/29/19 1500 10/01/19 0819 10/01/19 1050  LATICACIDVEN 1.0 1.5 1.6     ENDOCRINE CBG (last 3)  Recent Labs    09/29/19 1512 09/30/19 0845  GLUCAP 108* 110*         IMAGING x48h  - image(s) personally visualized  -   highlighted in bold DG Chest Port 1 View  Result Date: 09/29/2019 CLINICAL DATA:  Cough, weakness EXAM: PORTABLE CHEST 1 VIEW COMPARISON:  08/13/2018 FINDINGS: The heart size and mediastinal contours are within normal limits. Atherosclerotic calcification of the aortic knob. Both lungs are clear. The visualized skeletal structures are unremarkable. IMPRESSION: No active disease. Electronically Signed   By: Davina Poke D.O.   On: 09/29/2019 13:38   CT Renal Stone Study  Result Date: 09/29/2019 CLINICAL DATA:  86 year old male with history of acute renal failure. EXAM: CT ABDOMEN AND PELVIS WITHOUT CONTRAST TECHNIQUE: Multidetector CT imaging of the abdomen and pelvis was performed following the standard protocol without IV contrast. COMPARISON:  CT the abdomen and pelvis 08/04/2019. FINDINGS: Lower chest: Atherosclerotic calcifications in the descending thoracic aorta as well as the left circumflex and right coronary arteries. Hepatobiliary: No definite suspicious cystic or solid hepatic lesions are confidently identified on today's noncontrast CT examination. Amorphous high attenuation material lying dependently in the gallbladder, likely to reflect biliary sludge and/or tiny noncalcified gallstones. Gallbladder is nearly completely decompressed without surrounding inflammatory changes. Pancreas: No definite pancreatic mass or peripancreatic fluid collections or inflammatory changes are noted on today's noncontrast CT examination. Spleen: Unremarkable. Adrenals/Urinary Tract: No calcifications are  identified within the collecting system of either kidney, along the course of either ureter, or within the lumen of the urinary bladder. Mild right renal atrophy. Moderate right and severe left-sided hydroureteronephrosis, similar to the prior examination. 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 Small amount of gas non dependently in the urinary bladder. Urinary bladder is otherwise unremarkable in appearance. Previously noted Foley balloon catheter has been removed. Stomach/Bowel: Unenhanced appearance of the stomach is normal. No pathologic dilatation of small bowel or colon. The appendix is not confidently identified and may be surgically absent. Regardless, there are no inflammatory changes noted adjacent to the cecum to suggest the presence of an acute appendicitis at this time. Vascular/Lymphatic: Aortic atherosclerosis with mild aneurysmal dilatation of the infrarenal abdominal aorta which measures up to 2.9 x 3.1 cm (mean diameter of 3 cm). No lymphadenopathy noted in the abdomen or pelvis. Reproductive: Postoperative changes of TURP are noted in the prostate gland. Seminal vesicles are unremarkable in appearance. Other: No significant volume of ascites.  No pneumoperitoneum. Musculoskeletal: There are no aggressive appearing lytic or blastic lesions noted in the visualized portions of the skeleton. IMPRESSION: 1. Chronic bilateral hydroureteronephrosis (left greater than right), very similar to the prior examination. No obstructing stone identified. The possibility of distal ureteral stricture should be considered at or near the ureterovesicular junctions. 2. Mild right renal atrophy. 3. Small amount of gas non dependently in the lumen of the urinary bladder. This could be iatrogenic if there has been recent catheterization. Alternatively, correlation with urinalysis is recommended to exclude the possibility of urinary tract infection with gas-forming organisms.  4. Biliary sludge and/or tiny gallstones lying dependently in the gallbladder, without findings to suggest an acute cholecystitis at this time. 5. Aortic atherosclerosis with mild aneurysmal dilatation of the infrarenal abdominal aorta (3 cm in diameter). Recommend followup by ultrasound in 3 years. This recommendation follows ACR consensus guidelines: White Paper of the ACR Incidental Findings Committee II on Vascular Findings. J Am Coll Radiol 2013; 10:789-794. Electronically Signed   By: Vinnie Langton M.D.   On: 09/29/2019 16:12   IR NEPHROSTOMY PLACEMENT LEFT  Result Date: 09/30/2019 INDICATION: History of bladder cancer, now with obstructive bilateral uropathy and sepsis, post failed attempted placement of bilateral ureteral stents. As such, the patient presents today for image guided placement of bilateral percutaneous nephrostomy catheters for renal preservation and infection source control purposes. EXAM: 1. ULTRASOUND GUIDANCE FOR PUNCTURE OF THE BILATERAL RENAL COLLECTING SYSTEMS 2. FLUOROSCOPIC GUIDED BILATERAL PERCUTANEOUS NEPHROSTOMY TUBE PLACEMENT. COMPARISON:  CT abdomen pelvis-earlier same day; 08/04/2019 MEDICATIONS: Patient is currently admitted to the hospital receiving intravenous antibiotics; The antibiotic was administered in an appropriate time frame prior to skin puncture. ANESTHESIA/SEDATION: None CONTRAST:  15 mL Isovue 300 - administered into the renal collecting system FLUOROSCOPY TIME:  3 minutes, 18 seconds (33 mGy) COMPLICATIONS: None immediate. PROCEDURE: The procedure, risks, benefits, and alternatives were explained to the patient. Questions regarding the procedure were encouraged and answered. The patient understands and consents to the procedure. A timeout was performed prior to the initiation of the procedure. The bilateral flanks were prepped and draped in the usual sterile fashion and a sterile drape was applied covering the operative field. A sterile gown and sterile  gloves were used for the procedure. Local anesthesia was provided with 1% Lidocaine with epinephrine. Beginning with the left kidney, under direct ultrasound guidance, a 20 gauge needle was advanced into the renal collecting system after the subcutaneous tissues were anesthetized with 1% lidocaine with epinephrine. An ultrasound image documentation was performed. Access within the collecting system was confirmed with the efflux of urine followed by limited contrast injection. Over a Nitrex wire, the tract was dilated with an Accustick stent.  Next, under intermittent fluoroscopic guidance and over a short Amplatz wire, the track was dilated ultimately allowing placement of a 10-French percutaneous nephrostomy catheter which was advanced to the level of the renal pelvis where the coil was formed and locked. Contrast was injected and several spot fluoroscopic images were obtained in various obliquities. The catheter was secured at the skin with an interrupted prolene retention suture and stat lock device and connected to a gravity bag was placed. The identical procedure was repeated for the contralateral right kidney ultimately allowing placement of a 10 French percutaneous nephrostomy catheter with end coiled and locked within the right renal pelvis. Note was made to avoid the slightly interposed ascending colon as was demonstrated on preceding noncontrast abdominal CT. Dressings were applied. The patient tolerated the above procedures fairly well without immediate postprocedural complication though had to be restrained the fluoroscopy table due to altered mental status. FINDINGS: Ultrasound scanning demonstrates a moderate to severely dilatation of the bilateral renal collecting systems. Under a combination of ultrasound and fluoroscopic guidance, a posterior inferior calix was targeted allowing placement of a 10-French percutaneous nephrostomy catheter with end coiled and locked within the renal pelvis. Contrast  injection confirmed appropriate positioning. IMPRESSION: Successful ultrasound and fluoroscopic guided placement of bilateral percutaneous nephrostomy catheters. Electronically Signed   By: Sandi Mariscal M.D.   On: 09/30/2019 07:52   IR NEPHROSTOMY PLACEMENT RIGHT  Result Date: 09/30/2019 INDICATION: History of bladder cancer, now with obstructive bilateral uropathy and sepsis, post failed attempted placement of bilateral ureteral stents. As such, the patient presents today for image guided placement of bilateral percutaneous nephrostomy catheters for renal preservation and infection source control purposes. EXAM: 1. ULTRASOUND GUIDANCE FOR PUNCTURE OF THE BILATERAL RENAL COLLECTING SYSTEMS 2. FLUOROSCOPIC GUIDED BILATERAL PERCUTANEOUS NEPHROSTOMY TUBE PLACEMENT. COMPARISON:  CT abdomen pelvis-earlier same day; 08/04/2019 MEDICATIONS: Patient is currently admitted to the hospital receiving intravenous antibiotics; The antibiotic was administered in an appropriate time frame prior to skin puncture. ANESTHESIA/SEDATION: None CONTRAST:  15 mL Isovue 300 - administered into the renal collecting system FLUOROSCOPY TIME:  3 minutes, 18 seconds (33 mGy) COMPLICATIONS: None immediate. PROCEDURE: The procedure, risks, benefits, and alternatives were explained to the patient. Questions regarding the procedure were encouraged and answered. The patient understands and consents to the procedure. A timeout was performed prior to the initiation of the procedure. The bilateral flanks were prepped and draped in the usual sterile fashion and a sterile drape was applied covering the operative field. A sterile gown and sterile gloves were used for the procedure. Local anesthesia was provided with 1% Lidocaine with epinephrine. Beginning with the left kidney, under direct ultrasound guidance, a 20 gauge needle was advanced into the renal collecting system after the subcutaneous tissues were anesthetized with 1% lidocaine with  epinephrine. An ultrasound image documentation was performed. Access within the collecting system was confirmed with the efflux of urine followed by limited contrast injection. Over a Nitrex wire, the tract was dilated with an Accustick stent. Next, under intermittent fluoroscopic guidance and over a short Amplatz wire, the track was dilated ultimately allowing placement of a 10-French percutaneous nephrostomy catheter which was advanced to the level of the renal pelvis where the coil was formed and locked. Contrast was injected and several spot fluoroscopic images were obtained in various obliquities. The catheter was secured at the skin with an interrupted prolene retention suture and stat lock device and connected to a gravity bag was placed. The identical procedure was repeated for the contralateral  right kidney ultimately allowing placement of a 10 French percutaneous nephrostomy catheter with end coiled and locked within the right renal pelvis. Note was made to avoid the slightly interposed ascending colon as was demonstrated on preceding noncontrast abdominal CT. Dressings were applied. The patient tolerated the above procedures fairly well without immediate postprocedural complication though had to be restrained the fluoroscopy table due to altered mental status. FINDINGS: Ultrasound scanning demonstrates a moderate to severely dilatation of the bilateral renal collecting systems. Under a combination of ultrasound and fluoroscopic guidance, a posterior inferior calix was targeted allowing placement of a 10-French percutaneous nephrostomy catheter with end coiled and locked within the renal pelvis. Contrast injection confirmed appropriate positioning. IMPRESSION: Successful ultrasound and fluoroscopic guided placement of bilateral percutaneous nephrostomy catheters. Electronically Signed   By: Sandi Mariscal M.D.   On: 09/30/2019 07:52

## 2019-10-01 NOTE — Progress Notes (Signed)
Pharmacy Antibiotic Note  Benjamin Cardenas is a 86 y.o. male admitted on 09/29/2019 with sepsis.  Pharmacy has been consulted for cefepime dosing.  Pt with PMH significant for bladder cancer, sepsis presumed 2/2 to UTI. Pt has PCN allergy, but tolerated a dose of cefepime on 6/25.  Today, 10/01/19 Afeb, WBC now wnl, SCr 6.29 > 2.81  Plan:  Increase Cefepime to 2gm q24  Follow renal function and culture data  Height: 5\' 6"  (167.6 cm) Weight: 52.2 kg (115 lb 1.3 oz) IBW/kg (Calculated) : 63.8  Temp (24hrs), Avg:97.6 F (36.4 C), Min:97.4 F (36.3 C), Max:97.8 F (36.6 C)  Recent Labs  Lab 09/29/19 1258 09/29/19 1500 09/30/19 0303 09/30/19 1550 10/01/19 0502  WBC 16.6*  --  15.4*  --   --   CREATININE 6.29*  --  4.99* 3.84* 2.81*  LATICACIDVEN 1.4 1.0  --   --   --     Estimated Creatinine Clearance: 13.4 mL/min (A) (by C-G formula based on SCr of 2.81 mg/dL (H)).    Allergies  Allergen Reactions  . Amoxicillin Rash    Has patient had a PCN reaction causing immediate rash, facial/tongue/throat swelling, SOB or lightheadedness with hypotension:No Has patient had a PCN reaction causing severe rash involving mucus membranes or skin necrosis:unsure Has patient had a PCN reaction that required hospitalization:No Has patient had a PCN reaction occurring within the last 10 years:unsure If all of the above answers are "NO", then may proceed with Cephalosporin use.   . Ciprofloxacin Swelling and Rash  . Sulfasalazine Swelling  . Sulfa Antibiotics Swelling  . Doxycycline Hyclate Rash  . Levofloxacin Rash   Antimicrobials this admission: cefepime 6/26 >>   Dose adjustments this admission: 6/27 Cefepime 1gm q24 > 2gm q24 Microbiology results: 6/25 BCx: ngtd 6/25 UCx:  ng-final 6/25 MRSA PCR: neg  Thank you for allowing pharmacy to be a part of this patient's care.  Minda Ditto, PharmD 10/01/2019 7:42 AM

## 2019-10-01 NOTE — Progress Notes (Signed)
Patient significantly restless and agitated, but BP still too low for much pharmacological intervention. On call provider giving orders for Tylenol to be given more often to see if will help ease any discomfort.

## 2019-10-01 NOTE — Progress Notes (Signed)
PROGRESS NOTE    Benjamin Cardenas  TKP:[id number] DOB: 11/07/1929 DOA: 09/29/2019 PCP: Isaac Bliss, Rayford Halsted, MD   Brief Narrative: Benjamin Cardenas is a 86 y.o. male with medical history significant for bladder cancer with multiple episodes of urinary retention with known bilateral hydronephrosis despite TURP (11/07/2018) and swelling Foley catheter (09/20/2018) and unsuccessful attempt stent placement on 08/28/2019 by Dr. Karsten Ro due to inflamed bladder.  Patient presented secondary to worsening confusion, decreased urination over the past week.  On admission, patient found to have severe AKI with associated hyperkalemia in setting of severe bladder outlet obstruction.  Interventional radiology was consulted and placed nephrostomy tubes on admission.  Nephrology was consulted for management of AKI with recommendations for no hemodialysis but rather continued medical management.  Patient started on IV fluids for management of AKI.  Blood and urine cultures obtained and patient started on empiric cefepime.   Assessment & Plan:   Active Problems:   Hyponatremia   Altered mental status   Bladder cancer (HCC)   AKI (acute kidney injury) ([place])   Hyperkalemia   Toxic metabolic encephalopathy   Sepsis (HCC)   Hypercalcemia   Metabolic acidosis   Elevated troponin   Chronic bilateral hydronephrosis Patient follows with urology as an outpatient. Recently failed stent placement. Imaging was significant for persistent hydronephrosis. IR consulted and placed bilateral nephrostomy tubes on 6/25. Good urine output from both tubes; UOP increasing. -IR recommendations for tube management -Urology recommendations: Will evaluate today but likely no urological interventions inpatient  Obstructive uropathy AKI Metabolic acidosis Patient with a baseline creatinine of 1.7. Creatinine on admission was 6.29 with an associated BUN of 228. Patient was also transiently hypotensive which could have contributed  to some ATN. Nephrology was consulted on admission with recommendations for no dialysis, whether long or short-term. Patient started on IV fluids with mild improvement of kidney function. Nephrostomy tubes placed as mentioned above. Metabolic acidosis resolved with sodium bicarb fluids. AKI continues to improve.  Sepsis Present on admission with possible urinary source. It is possible this could not be sepsis, however. Infectious workup unremarkable. Urine culture with no growth and blood culture with no growth to date. WBC has improved from admission. Still difficult to say whether or not there was an infection -Continue Cefepime for a 5 day total course  Hyperkalemia Potassium of eight on admission. Patient initially with peaked T waves and received calcium gluconate, insulin in the ED. Potassium down to seven. -Give another dose of insulin, recheck potassium every 4 hours  Hypovolemic shock Resolved with IV fluids initially. Urine and blood cultures obtained on admission. Recurrent hypotension from 6/26-6/27 treated with IV fluid boluses but still with severely hypotensive pressures. No vasopressors initiated overnight. Improved morning of 6/27 with SBP of 109/62. Patient developed recurrent hypotension. 1L IV fluid bolus initiated and maintenance IV fluids changed/increased to NS 150 mL/hr. Lactic acid obtained and 1.5 -Repeat Lactic acid -Start phenylephrine IV infusion peripherally for now -Critical care consult  Hypercalcemia Mild. Does not appear associated with cancer. Resolved with IV fluids.  Acute metabolic encephalopathy In setting of significantly elevated BUN. At baseline, patient is alert and oriented x4. Patient's mental status has improved. -Will advance diet slowly  Hyperosmolar hyponatremia Likely hypovolemic. Patient's serum osmolality was 354 on admission in setting of significantly elevated BUN. Sodium is improved with improvement of BUN.  Resolved.  Leukocytosis Mild. Possible patient has an associated UTI. Patient was started on cefepime IV empirically. Urine cultures obtained and pending. Patient also obtain  blood cultures on admission -Cefepime as mentioned above  Elevated troponin Peak of 73 with subsequent downtrend. Likely secondary to demand ischemia. No ischemic changes on EKG.  Elevated lipase Lipase of 300 on admission. CT scan was significant for no evidence of acute pancreatitis and patient does not appear to have a clinical presentation is consistent with pancreatitis.  Prostate cancer Patient follows with Dr. Karsten Ro has an outpatient. History of tumor resection, BCG and Mitomycin.   DVT prophylaxis: Heparin subq Code Status:   Code Status: DNR Family Communication: Wife and son at bedside Disposition Plan: Discharge location unknown. Patient is improved from admission but still critical. Still likely several days before discharge pending continued improvement of renal function, hypotension, mental status.   Consultants:   Nephrology  Interventional radiology  Urology  Palliative care medicine  Procedures:   [place] AND FLUOROSCOPIC GUIDED PLACEMENT OF BILATERAL PERCUTANEOUS NEPHROSTOMY  Antimicrobials:  Cefepime    Subjective: Patient states he is thirst.  Objective: Vitals:   10/01/19 0440 10/01/19 0500 10/01/19 0600 10/01/19 0700  BP: (!) 72/50 (!) 79/54 (!) 73/44 109/62  Pulse: (!) 102 97 87 96  Resp: (!) 26 (!) 30 11 20   Temp:      TempSrc:      SpO2: 100% 100% 100% 100%  Weight:      Height:        Intake/Output Summary (Last 24 hours) at 10/01/2019 0853 Last data filed at 10/01/2019 0650 Gross per 24 hour  Intake 2855.03 ml  Output 2150 ml  Net 705.03 ml   Filed Weights   09/30/19 0350 10/01/19 0232  Weight: 55.1 kg 52.2 kg    Examination:  General exam: Appears slightly agitated and comfortable Respiratory system: Clear to auscultation. Respiratory effort  normal. Cardiovascular system: S1 & S2 heard, RRR. No murmurs, rubs, gallops or clicks. Finger tips with mild cyanosis Gastrointestinal system: Abdomen is nondistended, soft and nontender. No organomegaly or masses felt. Normal bowel sounds heard. Central nervous system: Alert and oriented to person and place. Musculoskeletal: No edema. No calf tenderness Skin: No cyanosis. No rashes Psychiatry: Judgement and insight appear slightly impaired.    Data Reviewed: I have personally reviewed following labs and imaging studies  CBC Lab Results  Component Value Date   WBC 9.1 10/01/2019   RBC 3.91 (L) 10/01/2019   HGB 11.7 (L) 10/01/2019   HCT 32.9 (L) 10/01/2019   MCV 84.1 10/01/2019   MCH 29.9 10/01/2019   PLT 271 10/01/2019   MCHC 35.6 10/01/2019   RDW 14.0 10/01/2019   LYMPHSABS 0.4 (L) 09/29/2019   MONOABS 0.7 09/29/2019   EOSABS 0.0 09/29/2019   BASOSABS 0.0 24/23/5361     Last metabolic panel Lab Results  Component Value Date   NA 140 10/01/2019   K 4.5 10/01/2019   CL 107 10/01/2019   CO2 23 10/01/2019   BUN 146 (H) 10/01/2019   CREATININE 2.81 (H) 10/01/2019   GLUCOSE 149 (H) 10/01/2019   GFRNONAA 19 (L) 10/01/2019   GFRAA 22 (L) 10/01/2019   CALCIUM 8.7 (L) 10/01/2019   PHOS 3.1 09/23/2018   PROT 6.3 (L) 10/01/2019   ALBUMIN 3.2 (L) 10/01/2019   BILITOT 0.8 10/01/2019   ALKPHOS 68 10/01/2019   AST 27 10/01/2019   ALT 21 10/01/2019   ANIONGAP 10 10/01/2019    CBG (last 3)  Recent Labs    09/29/19 1512 09/30/19 0845  GLUCAP 108* 110*     GFR: Estimated Creatinine Clearance: 13.4 mL/min (A) (by  C-G formula based on SCr of 2.81 mg/dL (H)).  Coagulation Profile: Recent Labs  Lab 09/29/19 1258  INR 0.9    Recent Results (from the past 240 hour(s))  Culture, blood (routine x 2)     Status: None (Preliminary result)   Collection Time: 09/29/19 12:58 PM   Specimen: BLOOD  Result Value Ref Range Status   Specimen Description   Final    BLOOD LEFT  ANTECUBITAL Performed at [facility] [address] [place], [place] [address]    Special Requests   Final    BOTTLES DRAWN AEROBIC AND ANAEROBIC Blood Culture adequate volume Performed at [facility] [address], [place], [place] [address]    Culture   Final    NO GROWTH 2 DAYS Performed at [facility] [address], [place], [place] [address]    Report Status PENDING  Incomplete  Urine culture     Status: None   Collection Time: 09/29/19 12:58 PM   Specimen: Urine, Random  Result Value Ref Range Status   Specimen Description   Final    URINE, RANDOM Performed at [facility] [address] [place], [place] [address]    Special Requests   Final    NONE Performed at [facility], [place] [address], [place], [place] [address]    Culture   Final    NO GROWTH Performed at [facility], [place] [address], [place], [place] [address]    Report Status 09/30/2019 FINAL  Final  Culture, blood (routine x 2)     Status: None (Preliminary result)   Collection Time: 09/29/19  2:47 PM   Specimen: BLOOD  Result Value Ref Range Status   Specimen Description   Final    BLOOD RIGHT ANTECUBITAL Performed at [facility] [address] [place], [place] [address]    Special Requests   Final    BOTTLES DRAWN AEROBIC AND ANAEROBIC Blood Culture adequate volume Performed at [facility] [address] [place], [place] [address]    Culture   Final    NO GROWTH 2 DAYS Performed at [facility] [address], [place], [place] [address]    Report Status PENDING  Incomplete  SARS Coronavirus 2 by RT PCR (hospital order, performed in [facility] hospital lab) Nasopharyngeal Nasopharyngeal Swab     Status: None   Collection Time: 09/29/19  2:57 PM   Specimen: Nasopharyngeal Swab  Result Value Ref Range Status   SARS Coronavirus 2 NEGATIVE NEGATIVE Final     Comment: (NOTE) SARS-CoV-2 target nucleic acids are NOT DETECTED.  The SARS-CoV-2 RNA is generally detectable in upper and lower respiratory specimens during the acute phase of infection. The lowest concentration of SARS-CoV-2 viral copies this assay can detect is 250 copies / mL. A negative result does not preclude SARS-CoV-2 infection and should not be used as the sole basis for treatment or other patient management decisions.  A negative result may occur with improper specimen collection / handling, submission of specimen other than nasopharyngeal swab, presence of viral mutation(s) within the areas targeted by this assay, and inadequate number of viral copies (<250 copies / mL). A negative result must be combined with clinical observations, patient history, and epidemiological information.  Fact Sheet for Patients:   StrictlyIdeas.no  Fact Sheet for Healthcare Providers: BankingDealers.co.za  This test is not yet approved or  cleared by the [place]  FDA and has been authorized for detection and/or diagnosis of SARS-CoV-2 by FDA under an Emergency Use Authorization (EUA).  This EUA will remain in effect (meaning this test can be used) for the duration of the COVID-19 declaration under Section 564(b)(1) of the Act, 21 U.S.C. section 360bbb-3(b)(1), unless the authorization is terminated or revoked sooner.  Performed at [facility], [place] [address] [place], [place] [address]   MRSA PCR Screening     Status: None   Collection Time: 09/29/19  8:45 PM   Specimen: Nasal Mucosa; Nasopharyngeal  Result Value Ref Range Status   MRSA by PCR NEGATIVE NEGATIVE Final    Comment:        The GeneXpert MRSA Assay (FDA approved for NASAL specimens only), is one component of a comprehensive MRSA colonization surveillance program. It is not intended to diagnose MRSA infection nor to guide or monitor treatment  for MRSA infections. Performed at [facility], [place] [address], [place],  [address]         Radiology Studies: DG Chest Port 1 View  Result Date: 09/29/2019 CLINICAL DATA:  Cough, weakness EXAM: PORTABLE CHEST 1 VIEW COMPARISON:  08/13/2018 FINDINGS: The heart size and mediastinal contours are within normal limits. Atherosclerotic calcification of the aortic knob. Both lungs are clear. The visualized skeletal structures are unremarkable. IMPRESSION: No active disease. Electronically Signed   By: Davina Poke D.O.   On: 09/29/2019 13:38   CT Renal Stone Study  Result Date: 09/29/2019 CLINICAL DATA:  86 year old male with history of acute renal failure. EXAM: CT ABDOMEN AND PELVIS WITHOUT CONTRAST TECHNIQUE: Multidetector CT imaging of the abdomen and pelvis was performed following the standard protocol without IV contrast. COMPARISON:  CT the abdomen and pelvis 08/04/2019. FINDINGS: Lower chest: Atherosclerotic calcifications in the descending thoracic aorta as well as the left circumflex and right coronary arteries. Hepatobiliary: No definite suspicious cystic or solid hepatic lesions are confidently identified on today's noncontrast CT examination. Amorphous high attenuation material lying dependently in the gallbladder, likely to reflect biliary sludge and/or tiny noncalcified gallstones. Gallbladder is nearly completely decompressed without surrounding inflammatory changes. Pancreas: No definite pancreatic mass or peripancreatic fluid collections or inflammatory changes are noted on today's noncontrast CT examination. Spleen: Unremarkable. Adrenals/Urinary Tract: No calcifications are identified within the collecting system of either kidney, along the course of either ureter, or within the lumen of the urinary bladder. Mild right renal atrophy. Moderate right and severe left-sided hydroureteronephrosis, similar to the prior examination. 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 Small amount of gas non dependently in the urinary bladder. Urinary bladder is otherwise unremarkable in appearance. Previously noted Foley balloon catheter has been removed. Stomach/Bowel: Unenhanced appearance of the stomach is normal. No pathologic dilatation of small bowel or colon. The appendix is not confidently identified and may be surgically absent. Regardless, there are no inflammatory changes noted adjacent to the cecum to suggest the presence of an acute appendicitis at this time. Vascular/Lymphatic: Aortic atherosclerosis with mild aneurysmal dilatation of the infrarenal abdominal aorta which measures up to 2.9 x 3.1 cm (mean diameter of 3 cm). No lymphadenopathy noted in the abdomen or pelvis. Reproductive: Postoperative changes of TURP are noted in the prostate gland. Seminal vesicles are unremarkable in appearance. Other: No significant volume of ascites.  No pneumoperitoneum. Musculoskeletal: There are no aggressive appearing lytic or blastic lesions noted in the visualized portions of the skeleton. IMPRESSION: 1. Chronic bilateral  hydroureteronephrosis (left greater than right), very similar to the prior examination. No obstructing stone identified. The possibility of distal ureteral stricture should be considered at or near the ureterovesicular junctions. 2. Mild right renal atrophy. 3. Small amount of gas non dependently in the lumen of the urinary bladder. This could be iatrogenic if there has been recent catheterization. Alternatively, correlation with urinalysis is recommended to exclude the possibility of urinary tract infection with gas-forming organisms. 4. Biliary sludge and/or tiny gallstones lying dependently in the gallbladder, without findings to suggest an acute cholecystitis at this time. 5. Aortic atherosclerosis with mild aneurysmal dilatation of the infrarenal abdominal aorta (3 cm in diameter). Recommend followup by  ultrasound in 3 years. This recommendation follows ACR consensus guidelines: White Paper of the ACR Incidental Findings Committee II on Vascular Findings. J Am Coll Radiol 2013; 10:789-794. Electronically Signed   By: Vinnie Langton M.D.   On: 09/29/2019 16:12   IR NEPHROSTOMY PLACEMENT LEFT  Result Date: 09/30/2019 INDICATION: History of bladder cancer, now with obstructive bilateral uropathy and sepsis, post failed attempted placement of bilateral ureteral stents. As such, the patient presents today for image guided placement of bilateral percutaneous nephrostomy catheters for renal preservation and infection source control purposes. EXAM: 1. ULTRASOUND GUIDANCE FOR PUNCTURE OF THE BILATERAL RENAL COLLECTING SYSTEMS 2. FLUOROSCOPIC GUIDED BILATERAL PERCUTANEOUS NEPHROSTOMY TUBE PLACEMENT. COMPARISON:  CT abdomen pelvis-earlier same day; 08/04/2019 MEDICATIONS: Patient is currently admitted to the hospital receiving intravenous antibiotics; The antibiotic was administered in an appropriate time frame prior to skin puncture. ANESTHESIA/SEDATION: None CONTRAST:  15 mL Isovue 300 - administered into the renal collecting system FLUOROSCOPY TIME:  3 minutes, 18 seconds (33 mGy) COMPLICATIONS: None immediate. PROCEDURE: The procedure, risks, benefits, and alternatives were explained to the patient. Questions regarding the procedure were encouraged and answered. The patient understands and consents to the procedure. A timeout was performed prior to the initiation of the procedure. The bilateral flanks were prepped and draped in the usual sterile fashion and a sterile drape was applied covering the operative field. A sterile gown and sterile gloves were used for the procedure. Local anesthesia was provided with 1% Lidocaine with epinephrine. Beginning with the left kidney, under direct ultrasound guidance, a 20 gauge needle was advanced into the renal collecting system after the subcutaneous tissues were anesthetized  with 1% lidocaine with epinephrine. An ultrasound image documentation was performed. Access within the collecting system was confirmed with the efflux of urine followed by limited contrast injection. Over a Nitrex wire, the tract was dilated with an Accustick stent. Next, under intermittent fluoroscopic guidance and over a short Amplatz wire, the track was dilated ultimately allowing placement of a 10-French percutaneous nephrostomy catheter which was advanced to the level of the renal pelvis where the coil was formed and locked. Contrast was injected and several spot fluoroscopic images were obtained in various obliquities. The catheter was secured at the skin with an interrupted prolene retention suture and stat lock device and connected to a gravity bag was placed. The identical procedure was repeated for the contralateral right kidney ultimately allowing placement of a 10 French percutaneous nephrostomy catheter with end coiled and locked within the right renal pelvis. Note was made to avoid the slightly interposed ascending colon as was demonstrated on preceding noncontrast abdominal CT. Dressings were applied. The patient tolerated the above procedures fairly well without immediate postprocedural complication though had to be restrained the fluoroscopy table due to altered mental status. FINDINGS: Ultrasound scanning demonstrates a moderate  to severely dilatation of the bilateral renal collecting systems. Under a combination of ultrasound and fluoroscopic guidance, a posterior inferior calix was targeted allowing placement of a 10-French percutaneous nephrostomy catheter with end coiled and locked within the renal pelvis. Contrast injection confirmed appropriate positioning. IMPRESSION: Successful ultrasound and fluoroscopic guided placement of bilateral percutaneous nephrostomy catheters. Electronically Signed   By: Sandi Mariscal M.D.   On: 09/30/2019 07:52   IR NEPHROSTOMY PLACEMENT RIGHT  Result Date:  09/30/2019 INDICATION: History of bladder cancer, now with obstructive bilateral uropathy and sepsis, post failed attempted placement of bilateral ureteral stents. As such, the patient presents today for image guided placement of bilateral percutaneous nephrostomy catheters for renal preservation and infection source control purposes. EXAM: 1. ULTRASOUND GUIDANCE FOR PUNCTURE OF THE BILATERAL RENAL COLLECTING SYSTEMS 2. FLUOROSCOPIC GUIDED BILATERAL PERCUTANEOUS NEPHROSTOMY TUBE PLACEMENT. COMPARISON:  CT abdomen pelvis-earlier same day; 08/04/2019 MEDICATIONS: Patient is currently admitted to the hospital receiving intravenous antibiotics; The antibiotic was administered in an appropriate time frame prior to skin puncture. ANESTHESIA/SEDATION: None CONTRAST:  15 mL Isovue 300 - administered into the renal collecting system FLUOROSCOPY TIME:  3 minutes, 18 seconds (33 mGy) COMPLICATIONS: None immediate. PROCEDURE: The procedure, risks, benefits, and alternatives were explained to the patient. Questions regarding the procedure were encouraged and answered. The patient understands and consents to the procedure. A timeout was performed prior to the initiation of the procedure. The bilateral flanks were prepped and draped in the usual sterile fashion and a sterile drape was applied covering the operative field. A sterile gown and sterile gloves were used for the procedure. Local anesthesia was provided with 1% Lidocaine with epinephrine. Beginning with the left kidney, under direct ultrasound guidance, a 20 gauge needle was advanced into the renal collecting system after the subcutaneous tissues were anesthetized with 1% lidocaine with epinephrine. An ultrasound image documentation was performed. Access within the collecting system was confirmed with the efflux of urine followed by limited contrast injection. Over a Nitrex wire, the tract was dilated with an Accustick stent. Next, under intermittent fluoroscopic  guidance and over a short Amplatz wire, the track was dilated ultimately allowing placement of a 10-French percutaneous nephrostomy catheter which was advanced to the level of the renal pelvis where the coil was formed and locked. Contrast was injected and several spot fluoroscopic images were obtained in various obliquities. The catheter was secured at the skin with an interrupted prolene retention suture and stat lock device and connected to a gravity bag was placed. The identical procedure was repeated for the contralateral right kidney ultimately allowing placement of a 10 French percutaneous nephrostomy catheter with end coiled and locked within the right renal pelvis. Note was made to avoid the slightly interposed ascending colon as was demonstrated on preceding noncontrast abdominal CT. Dressings were applied. The patient tolerated the above procedures fairly well without immediate postprocedural complication though had to be restrained the fluoroscopy table due to altered mental status. FINDINGS: Ultrasound scanning demonstrates a moderate to severely dilatation of the bilateral renal collecting systems. Under a combination of ultrasound and fluoroscopic guidance, a posterior inferior calix was targeted allowing placement of a 10-French percutaneous nephrostomy catheter with end coiled and locked within the renal pelvis. Contrast injection confirmed appropriate positioning. IMPRESSION: Successful ultrasound and fluoroscopic guided placement of bilateral percutaneous nephrostomy catheters. Electronically Signed   By: Sandi Mariscal M.D.   On: 09/30/2019 07:52        Scheduled Meds:  Chlorhexidine Gluconate Cloth  6 each  Topical Q0600   heparin  5,000 Units Subcutaneous Q8H   mouth rinse  15 mL Mouth Rinse BID   sodium chloride flush  5 mL Intracatheter Q8H   Continuous Infusions:  ceFEPime (MAXIPIME) IV     sodium bicarbonate 150 mEq in dextrose 5% 1000 mL 100 mL/hr at 10/01/19 0650    sodium chloride       LOS: 2 days     Cordelia Poche, MD [facility] 10/01/2019, 8:53 AM  If 7PM-7AM, please contact night-coverage [email or web address]

## 2019-10-01 NOTE — Progress Notes (Signed)
Daily Progress Note   Patient Name: Benjamin Cardenas       Date: 10/01/2019 DOB: May 27, 1929  Age: 86 y.o. MRN#: [id number] Attending Physician: Mariel Aloe, MD Primary Care Physician: Isaac Bliss, Rayford Halsted, MD Admit Date: 09/29/2019  Reason for Consultation/Follow-up: Establishing goals of care  Subjective: Much more awake and interactive this morning.  Asking for water.  Length of Stay: 2  Current Medications: Scheduled Meds:  . Chlorhexidine Gluconate Cloth  6 each Topical Q0600  . heparin  5,000 Units Subcutaneous Q8H  . mouth rinse  15 mL Mouth Rinse BID  . sodium chloride flush  5 mL Intracatheter Q8H    Continuous Infusions: . sodium chloride    . sodium chloride    . ceFEPime (MAXIPIME) IV    . phenylephrine (NEO-SYNEPHRINE) Adult infusion    . sodium chloride      PRN Meds: acetaminophen **OR** acetaminophen, ondansetron **OR** ondansetron (ZOFRAN) IV, senna-docusate  Physical Exam         Appears frail, more alert this morning Has some coughing after trying some water. Tachycardic on the monitor Appears to have regular work of breathing Restless and agitated at times Has bilateral nephrostomy tubes Does not have peripheral edema  Vital Signs: BP 109/62   Pulse 96   Temp 97.6 F (36.4 C) (Axillary)   Resp 20   Ht 5\' 6"  (1.676 m)   Wt 52.2 kg   SpO2 100%   BMI 18.57 kg/m  SpO2: SpO2: 100 % O2 Device: O2 Device: Room Air O2 Flow Rate:    Intake/output summary:   Intake/Output Summary (Last 24 hours) at 10/01/2019 1310 Last data filed at 10/01/2019 9371 Gross per 24 hour  Intake 2855.03 ml  Output 1500 ml  Net 1355.03 ml   LBM: Last BM Date:  (prior to admission) Baseline Weight: Weight: 55.1 kg Most recent weight: Weight: 52.2 kg         Palliative Assessment/Data:      Patient Active Problem List   Diagnosis Date Noted  . Toxic metabolic encephalopathy 69/67/8938  . Sepsis ([place]) 09/29/2019  . Hypercalcemia 09/29/2019  . Metabolic acidosis 01/20/5101  . Elevated troponin 09/29/2019  . Hyperkalemia 07/04/2019  . Vitamin D deficiency 01/27/2019  . BPH with urinary obstruction 11/07/2018  . Mild renal insufficiency 09/21/2018  .  Sepsis due to urinary tract infection ([place]) 08/14/2018  . Sepsis secondary to UTI ([place]) 08/13/2018  . AKI (acute kidney injury) ([place]) 08/13/2018  . Acute encephalopathy 08/13/2018  . Bladder outlet obstruction 08/13/2018  . AAA (abdominal aortic aneurysm) without rupture ([place]) 08/13/2018  . MRSA (methicillin resistant Staphylococcus aureus) carrier 05/30/2018  . Bladder cancer ([place]) 03/10/2018  . CAP (community acquired pneumonia) 06/26/2016  . Hyponatremia 12/13/2015  . Anemia 12/13/2015  . Altered mental status 12/13/2015  . Constipation 08/17/2015  . Memory impairment 12/04/2014  . Keratosis pilaris 02/25/2011  . 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 Inguinal hernia unilateral, non-recurrent, left 01/28/2011  . OTHER SYMPTOMS INVOLVING CARDIOVASCULAR SYSTEM 09/20/2009  . Osteoarthritis 01/31/2009  . SCIATICA, ACUTE 12/20/2007  . NECK PAIN 02/15/2007  . BPH (benign prostatic hyperplasia) 01/06/2007  . History of colonic polyps 01/06/2007    Palliative Care Assessment & Plan   Patient Profile:    Assessment: 86 year old gentleman with bladder cancer, recurrent bladder obstruction, status post percutaneous nephrostomy tubes, acute kidney injury and hyperkalemia at the time of admission.  Patient with functional decline, low blood pressures, restlessness and agitation at times, currently n.p.o. and awaiting speech therapy evaluations.   Recommendations/Plan:  Palliative services following for goals of care discussions.  Discussed with patient, wife and son present at the  bedside.  Discussed about continuing current mode of care, patient is on Tylenol as needed for pain management.  Discussed about adding low-dose IV fentanyl to be used on an as-needed basis in case Tylenol is ineffective.  Renal and urology colleagues are following, renal indices are being monitored.  Ultimately, discussed again with family about having hospice support after discharge.  Depending on hospital course, we will continue further discussions about home with hospice versus residential hospice.  Continue current mode of care for now.   Code Status:    Code Status Orders  (From admission, onward)         Start     Ordered   09/29/19 1914  Do not attempt resuscitation (DNR)  Continuous       Question Answer Comment  In the event of cardiac or respiratory ARREST Do not call a "code blue"   In the event of cardiac or respiratory ARREST Do not perform Intubation, CPR, defibrillation or ACLS   In the event of cardiac or respiratory ARREST Use medication by any route, position, wound care, and other measures to relive pain and suffering. May use oxygen, suction and manual treatment of airway obstruction as needed for comfort.      09/29/19 1914        Code Status History    Date Active Date Inactive Code Status Order ID Comments User Context   09/29/2019 2951 09/29/2019 1914 DNR [id number]  Desiree Hane, MD ED   11/07/2018 1038 11/08/2018 1424 Full Code [id number]  Kathie Rhodes, MD Inpatient   09/21/2018 0440 09/23/2018 1841 Full Code [id number]  Vianne Bulls, MD Inpatient   08/13/2018 2245 08/17/2018 1749 Full Code [id number]  Vianne Bulls, MD ED   06/26/2016 1943 06/29/2016 1905 Full Code [id number]  Etta Quill, DO ED   12/13/2015 2155 12/17/2015 2053 Full Code [id number]  Reubin Milan, MD Inpatient   Advance Care Planning Activity    Advance Directive Documentation     Most Recent Value  Type of Advance Directive Healthcare Power of Attorney, Living will  Pre-existing out of  facility DNR order (yellow form or pink MOST form) --  "  MOST" Form in Place? --       Prognosis:   guarded.   Discharge Planning:  Home with Hospice versus residential hospice.   Care plan was discussed with patient, wife, son RN and renal colleague Dr. Moshe Cipro.    Thank you for allowing the Palliative Medicine Team to assist in the care of this patient.   Time In: 9 Time Out: 9.35  Total Time 35 Prolonged Time Billed  no       Greater than 50%  of this time was spent counseling and coordinating care related to the above assessment and plan.  Loistine Chance, MD  Please contact Palliative Medicine Team phone at [phone] for questions and concerns.

## 2019-10-02 DIAGNOSIS — R52 Pain, unspecified: Secondary | ICD-10-CM

## 2019-10-02 DIAGNOSIS — Z515 Encounter for palliative care: Secondary | ICD-10-CM

## 2019-10-02 LAB — COMPREHENSIVE METABOLIC PANEL
ALT: 29 U/L (ref 0–44)
AST: 35 U/L (ref 15–41)
Albumin: 2.8 g/dL — ABNORMAL LOW (ref 3.5–5.0)
Alkaline Phosphatase: 60 U/L (ref 38–126)
Anion gap: 7 (ref 5–15)
BUN: 90 mg/dL — ABNORMAL HIGH (ref 8–23)
CO2: 24 mmol/L (ref 22–32)
Calcium: 8.2 mg/dL — ABNORMAL LOW (ref 8.9–10.3)
Chloride: 120 mmol/L — ABNORMAL HIGH (ref 98–111)
Creatinine, Ser: 1.82 mg/dL — ABNORMAL HIGH (ref 0.61–1.24)
GFR calc Af Amer: 38 mL/min — ABNORMAL LOW (ref >60)
GFR calc non Af Amer: 32 mL/min — ABNORMAL LOW (ref >60)
Glucose, Bld: 98 mg/dL (ref 70–99)
Potassium: 3.9 mmol/L (ref 3.5–5.1)
Sodium: 151 mmol/L — ABNORMAL HIGH (ref 135–145)
Total Bilirubin: 0.8 mg/dL (ref 0.3–1.2)
Total Protein: 5.5 g/dL — ABNORMAL LOW (ref 6.5–8.1)

## 2019-10-02 LAB — PHOSPHORUS: Phosphorus: 3.8 mg/dL (ref 2.5–4.6)

## 2019-10-02 LAB — MAGNESIUM: Magnesium: 2.2 mg/dL (ref 1.7–2.4)

## 2019-10-02 LAB — LIPASE, BLOOD: Lipase: 97 U/L — ABNORMAL HIGH (ref 11–51)

## 2019-10-02 MED ORDER — DEXTROSE-NACL 5-0.45 % IV SOLN: INTRAVENOUS | Status: DC

## 2019-10-02 MED ORDER — SODIUM CHLORIDE 0.9 % IV BOLUS
1000.0000 mL | Freq: Once | INTRAVENOUS | Status: AC
  Administered 2019-10-02: 1000 mL via INTRAVENOUS

## 2019-10-02 NOTE — Evaluation (Signed)
SLP Cancellation Note  Patient Details Name: Benjamin Cardenas MRN: [id number] DOB: Mar 05, 1930   Cancelled treatment:       Reason Eval/Treat Not Completed: Other (comment) (pt now for [facility] per chart review, will sign off, please reorder if desire)  Kathleen Lime, MS [place]   Macario Golds 10/02/2019, 11:54 AM

## 2019-10-02 NOTE — Progress Notes (Signed)
Chief Complaint: Patient was seen today for follow up (B)PCN  Supervising Physician: Markus Daft   Patient Status: [facility] - In-pt  Subjective: Pt resting. S/p (B)PCN placement for bilat hydronephrosis from obstructive bladder cancer. Had AKI on CKD with hyperkalemia, hyperkalemia resolved. Creatinine continues to trend down.    Objective: Physical Exam: BP (!) 68/34   Pulse 83   Temp (!) 96.4 F (35.8 C) (Axillary)   Resp 11   Ht 5\' 6"  (1.676 m)   Wt 55.3 kg   SpO2 100%   BMI 19.68 kg/m  (R)PCN intact, site clean. (L)PCN intact, site clean.  Good UOP from both drains.    Current Facility-Administered Medications:  .  0.9 %  sodium chloride infusion, 250 mL, Intravenous, Continuous, Mariel Aloe, MD, Last Rate: 20 mL/hr at 10/02/19 0530, 250 mL at 10/02/19 0530 .  acetaminophen (TYLENOL) tablet 650 mg, 650 mg, Oral, Q6H PRN **OR** acetaminophen (TYLENOL) suppository 650 mg, 650 mg, Rectal, Q6H PRN, Oretha Milch D, MD, 650 mg at 10/02/19 0508 .  ceFEPIme (MAXIPIME) 2 g in sodium chloride 0.9 % 100 mL IVPB, 2 g, Intravenous, Q24H, Green, Terri L, RPH, Stopped at 10/01/19 1414 .  Chlorhexidine Gluconate Cloth 2 % PADS 6 each, 6 each, Topical, Q0600, Desiree Hane, MD, 6 each at 10/02/19 0530 .  fentaNYL (SUBLIMAZE) injection 12.5 mcg, 12.5 mcg, Intravenous, Q3H PRN, Loistine Chance, MD, 12.5 mcg at 10/02/19 0508 .  heparin injection 5,000 Units, 5,000 Units, Subcutaneous, Q8H, Oretha Milch D, MD, 5,000 Units at 10/02/19 0508 .  MEDLINE mouth rinse, 15 mL, Mouth Rinse, BID, Mariel Aloe, MD, 15 mL at 10/01/19 2201 .  ondansetron (ZOFRAN) tablet 4 mg, 4 mg, Oral, Q6H PRN **OR** ondansetron (ZOFRAN) injection 4 mg, 4 mg, Intravenous, Q6H PRN, Nettey, Shayla D, MD .  phenylephrine (NEOSYNEPHRINE) 10-0.9 MG/250ML-% infusion, 25-200 mcg/min, Intravenous, Titrated, Mariel Aloe, MD, Paused at 10/02/19 0741 .  senna-docusate (Senokot-S) tablet 1 tablet, 1 tablet, Oral, QHS  PRN, Oretha Milch D, MD .  [COMPLETED] sodium chloride 0.9 % bolus 1,000 mL, 1,000 mL, Intravenous, Once, Stopping Infusion hung by another clincian at 09/29/19 2307 **AND** [COMPLETED] sodium chloride 0.9 % bolus 500 mL, 500 mL, Intravenous, Once, Stopped at 09/29/19 1427 **AND** sodium chloride 0.9 % bolus 250 mL, 250 mL, Intravenous, Once, Hayden Rasmussen, MD .  sodium chloride flush (NS) 0.9 % injection 5 mL, 5 mL, Intracatheter, Q8H, Sandi Mariscal, MD, 5 mL at 10/02/19 5462  Labs: CBC Recent Labs    09/30/19 0303 10/01/19 0819  WBC 15.4* 9.1  HGB 14.3 11.7*  HCT 41.1 32.9*  PLT 371 271   BMET Recent Labs    10/01/19 0502 10/02/19 0345  NA 140 151*  K 4.5 3.9  CL 107 120*  CO2 23 24  GLUCOSE 149* 98  BUN 146* 90*  CREATININE 2.81* 1.82*  CALCIUM 8.7* 8.2*   LFT Recent Labs    10/02/19 0345  PROT 5.5*  ALBUMIN 2.8*  AST 35  ALT 29  ALKPHOS 60  BILITOT 0.8  LIPASE 97*   PT/INR Recent Labs    09/29/19 1258  LABPROT 12.0  INR 0.9     Studies/Results: No results found.  Assessment/Plan: S/p (B)PCN placement on 09/30/19 by Dr. Pascal Lux. K and Cr continues to trending down.  Plan per primary and Urology. IR following.    LOS: 3 days   I spent a total of 20 minutes in face to face  in clinical consultation, greater than 50% of which was counseling/coordinating care for (B)PCN  Aimee H Han PA-S/Kevin Lucio Litsey,PA-C 10/02/2019 9:16 AM

## 2019-10-02 NOTE — Discharge Summary (Signed)
Physician Discharge Summary  Benjamin Cardenas MHD:[id number] DOB: 1929-06-02 DOA: 09/29/2019  PCP: Isaac Bliss, Rayford Halsted, MD  Admit date: 09/29/2019 Discharge date: 10/02/2019  Admitted From: Home Disposition: Hospice facility   Discharge Condition: Hospice CODE STATUS: DNR Diet recommendation: Comfort   Brief/Interim Summary:  Admission HPI written by Desiree Hane, MD   Chief Complaint: Worsening confusion  HPI: Benjamin Cardenas is a 86 y.o. male with medical history significant for bladder cancer with multiple episodes of urinary retention with known bilateral hydronephrosis despite TURP (11/07/2018) and swelling Foley catheter (09/20/2018) and unsuccessful attempt stent placement on 08/28/2019 by Dr. Karsten Ro due to inflamed bladder.  He was discharged with plan to ast hospitalization who presented on 09/29/2019 with reports of worsening confusion, decreased urination for the past week as reported by wife.  Patient is unable to provide any history sees only alert to self.   Aggressively managed bladder inflammation to prevent further obstruction and potentially avoid bilateral nephrostomy tube placement he was seen by his PCP on 09/19/2019 was in his usual state of health at that time with only complaint by wife being 15 pound weight loss over the course of 8 months.  His wife stated over the past 4 days he has had decreased ability to control his urine, inability to walk on his own, requiring more assistance from home nursing.  Given decline in his care she asked for a referral to hospice from her PCP.  Per chart review patient presented to surgery Center today with elevated creatinine of 3 with confusion and generalized weakness.   Hospital course:  Chronic bilateral hydronephrosis Patient follows with urology as an outpatient. Recently failed stent placement. Imaging was significant for persistent hydronephrosis. IR consulted and placed bilateral nephrostomy tubes on  6/25. Good urine output from both tubes; UOP increased. Patient is now transitioned to comfort measures only.  Obstructive uropathy AKI Metabolic acidosis Patient with a baseline creatinine of 1.7. Creatinine on admission was 6.29 with an associated BUN of 228. Patient was also transiently hypotensive which could have contributed to some ATN. Nephrology was consulted on admission with recommendations for no dialysis, whether long or short-term. Patient started on IV fluids with mild improvement of kidney function. Nephrostomy tubes placed as mentioned above. Metabolic acidosis resolved with sodium bicarb fluids. AKI continues to improve. Comfort measures.  Sepsis Present on admission with possible urinary source. It is possible this could not be sepsis, however. Infectious workup unremarkable. Urine culture with no growth and blood culture with no growth to date. WBC has improved from admission. Still difficult to say whether or not there was an infection. Treated with Cefepime and is now comfort measures.  Hyperkalemia Potassium of eight on admission. Patient initially with peaked T waves and received calcium gluconate, insulin in the ED. Potassium down to seven. -Give another dose of insulin, recheck potassium every 4 hours  Hypovolemic shock Resolved with IV fluids initially. Urine and blood cultures obtained on admission. Recurrent hypotension from 6/26-6/27 treated with IV fluid boluses but still with severely hypotensive pressures. No vasopressors initiated overnight. Improved morning of 6/27 with SBP of 109/62. Patient developed recurrent hypotension. 1L IV fluid bolus initiated and maintenance IV fluids changed/increased to NS 150 mL/hr. Lactic acid obtained and 1.5 -> 1.6. Started on phenylephrine with improvement in blood pressure prior to decision to transition to comfort measures. Blood pressure improved. If blood pressures >SBP of 90, will be stable for transfer to hospice  facility.  Hypercalcemia Mild. Does not  appear associated with cancer. Resolved with IV fluids.  Acute metabolic encephalopathy In setting of significantly elevated BUN. At baseline, patient is alert and oriented x4. Patient's mental status initially improved but worsened again. Now comfort measures.  Hyperosmolar hyponatremia Likely hypovolemic. Patient's serum osmolality was 354 on admission in setting of significantly elevated BUN. Sodium is improved with improvement of BUN. Resolved.  Hypernatremia Secondary to isotonic saline. Comfort measures.  Leukocytosis Mild. Possible patient has an associated UTI. Patient was started on cefepime IV empirically. Urine cultures obtained and pending. Patient also obtain blood cultures on admission. Leukocytosis resolved.  Elevated troponin Peak of 73 with subsequent downtrend. Likely secondary to demand ischemia. No ischemic changes on EKG.  Elevated lipase Lipase of 300 on admission. CT scan was significant for no evidence of acute pancreatitis and patient does not appear to have a clinical presentation is consistent with pancreatitis.  Prostate cancer Patient follows with Dr. Karsten Ro has an outpatient. History of tumor resection, BCG and Mitomycin.  Discharge Diagnoses:  Active Problems:   Hyponatremia   Altered mental status   Bladder cancer (HCC)   AKI (acute kidney injury) ([place])   Hyperkalemia   Toxic metabolic encephalopathy   Sepsis ([place])   Hypercalcemia   Metabolic acidosis   Elevated troponin   Palliative care by specialist   Goals of care, counseling/discussion   General weakness   Comfort measures only status    Discharge Instructions   Allergies as of 10/02/2019      Reactions   Amoxicillin Rash   Has patient had a PCN reaction causing immediate rash, facial/tongue/throat swelling, SOB or lightheadedness with hypotension:No Has patient had a PCN reaction causing severe rash involving mucus membranes or  skin necrosis:unsure Has patient had a PCN reaction that required hospitalization:No Has patient had a PCN reaction occurring within the last 10 years:unsure If all of the above answers are "NO", then may proceed with Cephalosporin use.   Ciprofloxacin Swelling, Rash   Sulfasalazine Swelling   Sulfa Antibiotics Swelling   Doxycycline Hyclate Rash   Levofloxacin Rash      Medication List    STOP taking these medications   cetirizine 10 MG tablet Commonly known as: ZYRTEC   cyanocobalamin 1000 MCG tablet   naphazoline-pheniramine 0.025-0.3 % ophthalmic solution Commonly known as: NAPHCON-A   polyethylene glycol 17 g packet Commonly known as: MIRALAX / GLYCOLAX   Trulance 3 MG Tabs Generic drug: Plecanatide   VITAMIN B 12 PO       Allergies  Allergen Reactions  . Amoxicillin Rash    Has patient had a PCN reaction causing immediate rash, facial/tongue/throat swelling, SOB or lightheadedness with hypotension:No Has patient had a PCN reaction causing severe rash involving mucus membranes or skin necrosis:unsure Has patient had a PCN reaction that required hospitalization:No Has patient had a PCN reaction occurring within the last 10 years:unsure If all of the above answers are "NO", then may proceed with Cephalosporin use.   . Ciprofloxacin Swelling and Rash  . Sulfasalazine Swelling  . Sulfa Antibiotics Swelling  . Doxycycline Hyclate Rash  . Levofloxacin Rash    Consultations:  Interventional radiology  Urology  Palliative care medicine  Critical care medicine   Procedures/Studies: DG Chest Port 1 View  Result Date: 09/29/2019 CLINICAL DATA:  Cough, weakness EXAM: PORTABLE CHEST 1 VIEW COMPARISON:  08/13/2018 FINDINGS: The heart size and mediastinal contours are within normal limits. Atherosclerotic calcification of the aortic knob. Both lungs are clear. The visualized skeletal structures are unremarkable.  IMPRESSION: No active disease. Electronically Signed    By: Davina Poke D.O.   On: 09/29/2019 13:38   CT Renal Stone Study  Result Date: 09/29/2019 CLINICAL DATA:  86 year old male with history of acute renal failure. EXAM: CT ABDOMEN AND PELVIS WITHOUT CONTRAST TECHNIQUE: Multidetector CT imaging of the abdomen and pelvis was performed following the standard protocol without IV contrast. COMPARISON:  CT the abdomen and pelvis 08/04/2019. FINDINGS: Lower chest: Atherosclerotic calcifications in the descending thoracic aorta as well as the left circumflex and right coronary arteries. Hepatobiliary: No definite suspicious cystic or solid hepatic lesions are confidently identified on today's noncontrast CT examination. Amorphous high attenuation material lying dependently in the gallbladder, likely to reflect biliary sludge and/or tiny noncalcified gallstones. Gallbladder is nearly completely decompressed without surrounding inflammatory changes. Pancreas: No definite pancreatic mass or peripancreatic fluid collections or inflammatory changes are noted on today's noncontrast CT examination. Spleen: Unremarkable. Adrenals/Urinary Tract: No calcifications are identified within the collecting system of either kidney, along the course of either ureter, or within the lumen of the urinary bladder. Mild right renal atrophy. Moderate right and severe left-sided hydroureteronephrosis, similar to the prior examination. 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 Small amount of gas non dependently in the urinary bladder. Urinary bladder is otherwise unremarkable in appearance. Previously noted Foley balloon catheter has been removed. Stomach/Bowel: Unenhanced appearance of the stomach is normal. No pathologic dilatation of small bowel or colon. The appendix is not confidently identified and may be surgically absent. Regardless, there are no inflammatory changes noted adjacent to the cecum to suggest the presence of an acute  appendicitis at this time. Vascular/Lymphatic: Aortic atherosclerosis with mild aneurysmal dilatation of the infrarenal abdominal aorta which measures up to 2.9 x 3.1 cm (mean diameter of 3 cm). No lymphadenopathy noted in the abdomen or pelvis. Reproductive: Postoperative changes of TURP are noted in the prostate gland. Seminal vesicles are unremarkable in appearance. Other: No significant volume of ascites.  No pneumoperitoneum. Musculoskeletal: There are no aggressive appearing lytic or blastic lesions noted in the visualized portions of the skeleton. IMPRESSION: 1. Chronic bilateral hydroureteronephrosis (left greater than right), very similar to the prior examination. No obstructing stone identified. The possibility of distal ureteral stricture should be considered at or near the ureterovesicular junctions. 2. Mild right renal atrophy. 3. Small amount of gas non dependently in the lumen of the urinary bladder. This could be iatrogenic if there has been recent catheterization. Alternatively, correlation with urinalysis is recommended to exclude the possibility of urinary tract infection with gas-forming organisms. 4. Biliary sludge and/or tiny gallstones lying dependently in the gallbladder, without findings to suggest an acute cholecystitis at this time. 5. Aortic atherosclerosis with mild aneurysmal dilatation of the infrarenal abdominal aorta (3 cm in diameter). Recommend followup by ultrasound in 3 years. This recommendation follows ACR consensus guidelines: White Paper of the ACR Incidental Findings Committee II on Vascular Findings. J Am Coll Radiol 2013; 10:789-794. Electronically Signed   By: Vinnie Langton M.D.   On: 09/29/2019 16:12   IR NEPHROSTOMY PLACEMENT LEFT  Result Date: 09/30/2019 INDICATION: History of bladder cancer, now with obstructive bilateral uropathy and sepsis, post failed attempted placement of bilateral ureteral stents. As such, the patient presents today for image guided  placement of bilateral percutaneous nephrostomy catheters for renal preservation and infection source control purposes. EXAM: 1. ULTRASOUND GUIDANCE FOR PUNCTURE OF THE BILATERAL RENAL COLLECTING SYSTEMS 2. FLUOROSCOPIC GUIDED BILATERAL PERCUTANEOUS NEPHROSTOMY  TUBE PLACEMENT. COMPARISON:  CT abdomen pelvis-earlier same day; 08/04/2019 MEDICATIONS: Patient is currently admitted to the hospital receiving intravenous antibiotics; The antibiotic was administered in an appropriate time frame prior to skin puncture. ANESTHESIA/SEDATION: None CONTRAST:  15 mL Isovue 300 - administered into the renal collecting system FLUOROSCOPY TIME:  3 minutes, 18 seconds (33 mGy) COMPLICATIONS: None immediate. PROCEDURE: The procedure, risks, benefits, and alternatives were explained to the patient. Questions regarding the procedure were encouraged and answered. The patient understands and consents to the procedure. A timeout was performed prior to the initiation of the procedure. The bilateral flanks were prepped and draped in the usual sterile fashion and a sterile drape was applied covering the operative field. A sterile gown and sterile gloves were used for the procedure. Local anesthesia was provided with 1% Lidocaine with epinephrine. Beginning with the left kidney, under direct ultrasound guidance, a 20 gauge needle was advanced into the renal collecting system after the subcutaneous tissues were anesthetized with 1% lidocaine with epinephrine. An ultrasound image documentation was performed. Access within the collecting system was confirmed with the efflux of urine followed by limited contrast injection. Over a Nitrex wire, the tract was dilated with an Accustick stent. Next, under intermittent fluoroscopic guidance and over a short Amplatz wire, the track was dilated ultimately allowing placement of a 10-French percutaneous nephrostomy catheter which was advanced to the level of the renal pelvis where the coil was formed and  locked. Contrast was injected and several spot fluoroscopic images were obtained in various obliquities. The catheter was secured at the skin with an interrupted prolene retention suture and stat lock device and connected to a gravity bag was placed. The identical procedure was repeated for the contralateral right kidney ultimately allowing placement of a 10 French percutaneous nephrostomy catheter with end coiled and locked within the right renal pelvis. Note was made to avoid the slightly interposed ascending colon as was demonstrated on preceding noncontrast abdominal CT. Dressings were applied. The patient tolerated the above procedures fairly well without immediate postprocedural complication though had to be restrained the fluoroscopy table due to altered mental status. FINDINGS: Ultrasound scanning demonstrates a moderate to severely dilatation of the bilateral renal collecting systems. Under a combination of ultrasound and fluoroscopic guidance, a posterior inferior calix was targeted allowing placement of a 10-French percutaneous nephrostomy catheter with end coiled and locked within the renal pelvis. Contrast injection confirmed appropriate positioning. IMPRESSION: Successful ultrasound and fluoroscopic guided placement of bilateral percutaneous nephrostomy catheters. Electronically Signed   By: Sandi Mariscal M.D.   On: 09/30/2019 07:52   IR NEPHROSTOMY PLACEMENT RIGHT  Result Date: 09/30/2019 INDICATION: History of bladder cancer, now with obstructive bilateral uropathy and sepsis, post failed attempted placement of bilateral ureteral stents. As such, the patient presents today for image guided placement of bilateral percutaneous nephrostomy catheters for renal preservation and infection source control purposes. EXAM: 1. ULTRASOUND GUIDANCE FOR PUNCTURE OF THE BILATERAL RENAL COLLECTING SYSTEMS 2. FLUOROSCOPIC GUIDED BILATERAL PERCUTANEOUS NEPHROSTOMY TUBE PLACEMENT. COMPARISON:  CT abdomen  pelvis-earlier same day; 08/04/2019 MEDICATIONS: Patient is currently admitted to the hospital receiving intravenous antibiotics; The antibiotic was administered in an appropriate time frame prior to skin puncture. ANESTHESIA/SEDATION: None CONTRAST:  15 mL Isovue 300 - administered into the renal collecting system FLUOROSCOPY TIME:  3 minutes, 18 seconds (33 mGy) COMPLICATIONS: None immediate. PROCEDURE: The procedure, risks, benefits, and alternatives were explained to the patient. Questions regarding the procedure were encouraged and answered. The patient understands and consents to  the procedure. A timeout was performed prior to the initiation of the procedure. The bilateral flanks were prepped and draped in the usual sterile fashion and a sterile drape was applied covering the operative field. A sterile gown and sterile gloves were used for the procedure. Local anesthesia was provided with 1% Lidocaine with epinephrine. Beginning with the left kidney, under direct ultrasound guidance, a 20 gauge needle was advanced into the renal collecting system after the subcutaneous tissues were anesthetized with 1% lidocaine with epinephrine. An ultrasound image documentation was performed. Access within the collecting system was confirmed with the efflux of urine followed by limited contrast injection. Over a Nitrex wire, the tract was dilated with an Accustick stent. Next, under intermittent fluoroscopic guidance and over a short Amplatz wire, the track was dilated ultimately allowing placement of a 10-French percutaneous nephrostomy catheter which was advanced to the level of the renal pelvis where the coil was formed and locked. Contrast was injected and several spot fluoroscopic images were obtained in various obliquities. The catheter was secured at the skin with an interrupted prolene retention suture and stat lock device and connected to a gravity bag was placed. The identical procedure was repeated for the  contralateral right kidney ultimately allowing placement of a 10 French percutaneous nephrostomy catheter with end coiled and locked within the right renal pelvis. Note was made to avoid the slightly interposed ascending colon as was demonstrated on preceding noncontrast abdominal CT. Dressings were applied. The patient tolerated the above procedures fairly well without immediate postprocedural complication though had to be restrained the fluoroscopy table due to altered mental status. FINDINGS: Ultrasound scanning demonstrates a moderate to severely dilatation of the bilateral renal collecting systems. Under a combination of ultrasound and fluoroscopic guidance, a posterior inferior calix was targeted allowing placement of a 10-French percutaneous nephrostomy catheter with end coiled and locked within the renal pelvis. Contrast injection confirmed appropriate positioning. IMPRESSION: Successful ultrasound and fluoroscopic guided placement of bilateral percutaneous nephrostomy catheters. Electronically Signed   By: Sandi Mariscal M.D.   On: 09/30/2019 07:52     Subjective: Confused.  Discharge Exam: Vitals:   10/02/19 1215 10/02/19 1230  BP: 136/72 119/67  Pulse: 83 75  Resp: 18 11  Temp:    SpO2: 100% 99%   Vitals:   10/02/19 1145 10/02/19 1200 10/02/19 1215 10/02/19 1230  BP: 131/72 (!) 135/56 136/72 119/67  Pulse: 74 76 83 75  Resp: 14 11 18 11   Temp:  (!) 97.2 F (36.2 C)    TempSrc:  Axillary    SpO2: 100% 100% 100% 99%  Weight:      Height:        General: Pt is alert, awake, not in acute distress Cardiovascular: RRR, S1/S2 +, no rubs, no gallops Respiratory: Diminished, no wheezing. Abdominal: Soft, NT, ND, bowel sounds + Extremities: no edema, no cyanosis Neuro: not oriented    The results of significant diagnostics from this hospitalization (including imaging, microbiology, ancillary and laboratory) are listed below for reference.     Microbiology: Recent Results (from  the past 240 hour(s))  Culture, blood (routine x 2)     Status: None (Preliminary result)   Collection Time: 09/29/19 12:58 PM   Specimen: BLOOD  Result Value Ref Range Status   Specimen Description   Final    BLOOD LEFT ANTECUBITAL Performed at [facility] [address], [place], [place] [address]    Special Requests   Final    BOTTLES DRAWN  AEROBIC AND ANAEROBIC Blood Culture adequate volume Performed at [facility] [address] [place], [place] [address]    Culture   Final    NO GROWTH 2 DAYS Performed at [facility] [address], [place], [place] [address]    Report Status PENDING  Incomplete  Urine culture     Status: None   Collection Time: 09/29/19 12:58 PM   Specimen: Urine, Random  Result Value Ref Range Status   Specimen Description   Final    URINE, RANDOM Performed at [facility] [address], [place], [place] [address]    Special Requests   Final    NONE Performed at [facility], [place] [address] [place], [place] [address]    Culture   Final    NO GROWTH Performed at [facility], [place] [address], [place], [place] [address]    Report Status 09/30/2019 FINAL  Final  Culture, blood (routine x 2)     Status: None (Preliminary result)   Collection Time: 09/29/19  2:47 PM   Specimen: BLOOD  Result Value Ref Range Status   Specimen Description   Final    BLOOD RIGHT ANTECUBITAL Performed at [facility] [address] [place], [place] [address]    Special Requests   Final    BOTTLES DRAWN AEROBIC AND ANAEROBIC Blood Culture adequate volume Performed at [facility] [address] [place], [place] [address]    Culture   Final    NO GROWTH 2 DAYS Performed at [facility] [address] [place], [place] [address]    Report Status PENDING  Incomplete  SARS Coronavirus 2 by RT PCR (hospital order, performed in  [facility] hospital lab) Nasopharyngeal Nasopharyngeal Swab     Status: None   Collection Time: 09/29/19  2:57 PM   Specimen: Nasopharyngeal Swab  Result Value Ref Range Status   SARS Coronavirus 2 NEGATIVE NEGATIVE Final    Comment: (NOTE) SARS-CoV-2 target nucleic acids are NOT DETECTED.  The SARS-CoV-2 RNA is generally detectable in upper and lower respiratory specimens during the acute phase of infection. The lowest concentration of SARS-CoV-2 viral copies this assay can detect is 250 copies / mL. A negative result does not preclude SARS-CoV-2 infection and should not be used as the sole basis for treatment or other patient management decisions.  A negative result may occur with improper specimen collection / handling, submission of specimen other than nasopharyngeal swab, presence of viral mutation(s) within the areas targeted by this assay, and inadequate number of viral copies (<250 copies / mL). A negative result must be combined with clinical observations, patient history, and epidemiological information.  Fact Sheet for Patients:   StrictlyIdeas.no  Fact Sheet for Healthcare Providers: BankingDealers.co.za  This test is not yet approved or  cleared by the [place] FDA and has been authorized for detection and/or diagnosis of SARS-CoV-2 by FDA under an Emergency Use Authorization (EUA).  This EUA will remain in effect (meaning this test can be used) for the duration of the COVID-19 declaration under Section 564(b)(1) of the Act, 21 U.S.C. section 360bbb-3(b)(1), unless the authorization is terminated or revoked sooner.  Performed at [facility], [place] [address], [place], [place] [address]   MRSA PCR Screening     Status: None   Collection Time: 09/29/19  8:45 PM   Specimen: Nasal Mucosa; Nasopharyngeal  Result Value Ref Range Status  MRSA by PCR NEGATIVE NEGATIVE Final    Comment:        The  GeneXpert MRSA Assay (FDA approved for NASAL specimens only), is one component of a comprehensive MRSA colonization surveillance program. It is not intended to diagnose MRSA infection nor to guide or monitor treatment for MRSA infections. Performed at [facility], [place] [address] [place], [place] [address]      Labs: BNP (last 3 results) No results for input(s): BNP in the last 8760 hours. Basic Metabolic Panel: Recent Labs  Lab 09/29/19 1258 09/29/19 1258 09/29/19 1500 09/30/19 0303 09/30/19 1236 09/30/19 1550 09/30/19 2125 10/01/19 0111 10/01/19 0502 10/02/19 0345  NA 122*  --   --  124*  --  135  --   --  140 151*  K 8.0*   < >  --  7.0*   < > 5.8* 5.2* 5.1 4.5 3.9  CL 89*  --   --  97*  --  108  --   --  107 120*  CO2 16*  --   --  13*  --  16*  --   --  23 24  GLUCOSE 150*   < > 100* 113*  --  147*  --   --  149* 98  BUN 228*  --   --  217*  --  154*  --   --  146* 90*  CREATININE 6.29*  --   --  4.99*  --  3.84*  --   --  2.81* 1.82*  CALCIUM 10.4*  --   --  9.6  --  9.7  --   --  8.7* 8.2*  MG  --   --   --   --   --   --   --   --   --  2.2  PHOS  --   --   --   --   --   --   --   --   --  3.8   < > = values in this interval not displayed.   Liver Function Tests: Recent Labs  Lab 09/29/19 1258 09/30/19 0303 10/01/19 0502 10/02/19 0345  AST 32 32 27 35  ALT 31 29 21 29   ALKPHOS 109 92 68 60  BILITOT 0.7 0.9 0.8 0.8  PROT 9.4* 8.0 6.3* 5.5*  ALBUMIN 5.2* 4.3 3.2* 2.8*   Recent Labs  Lab 09/29/19 1258 10/02/19 0345  LIPASE 323* 97*   No results for input(s): AMMONIA in the last 168 hours. CBC: Recent Labs  Lab 09/29/19 1258 09/30/19 0303 10/01/19 0819  WBC 16.6* 15.4* 9.1  NEUTROABS 15.4*  --   --   HGB 14.9 14.3 11.7*  HCT 42.5 41.1 32.9*  MCV 86.2 87.3 84.1  PLT 421* 371 271   Cardiac Enzymes: No results for input(s): CKTOTAL, CKMB, CKMBINDEX, TROPONINI in the last 168 hours. BNP: Invalid input(s):  POCBNP CBG: Recent Labs  Lab 09/29/19 1512 09/30/19 0845  GLUCAP 108* 110*   D-Dimer No results for input(s): DDIMER in the last 72 hours. Hgb A1c No results for input(s): HGBA1C in the last 72 hours. Lipid Profile No results for input(s): CHOL, HDL, LDLCALC, TRIG, CHOLHDL, LDLDIRECT in the last 72 hours. Thyroid function studies No results for input(s): TSH, T4TOTAL, T3FREE, THYROIDAB in the last 72 hours.  Invalid input(s): FREET3 Anemia work up No results for input(s): VITAMINB12, FOLATE, FERRITIN, TIBC, IRON, RETICCTPCT in the last 72 hours. Urinalysis  Component Value Date/Time   COLORURINE YELLOW 09/29/2019 1258   APPEARANCEUR CLOUDY (A) 09/29/2019 1258   LABSPEC 1.010 09/29/2019 1258   PHURINE 6.0 09/29/2019 1258   GLUCOSEU 50 (A) 09/29/2019 1258   HGBUR LARGE (A) 09/29/2019 1258   BILIRUBINUR NEGATIVE 09/29/2019 1258   BILIRUBINUR n 06/26/2016 0810   KETONESUR NEGATIVE 09/29/2019 1258   PROTEINUR 100 (A) 09/29/2019 1258   UROBILINOGEN 0.2 06/26/2016 0810   UROBILINOGEN 0.2 07/11/2012 0200   NITRITE NEGATIVE 09/29/2019 1258   LEUKOCYTESUR LARGE (A) 09/29/2019 1258   Sepsis Labs Invalid input(s): PROCALCITONIN,  WBC,  LACTICIDVEN Microbiology Recent Results (from the past 240 hour(s))  Culture, blood (routine x 2)     Status: None (Preliminary result)   Collection Time: 09/29/19 12:58 PM   Specimen: BLOOD  Result Value Ref Range Status   Specimen Description   Final    BLOOD LEFT ANTECUBITAL Performed at [facility], [place] [address], [place], [place] [address]    Special Requests   Final    BOTTLES DRAWN AEROBIC AND ANAEROBIC Blood Culture adequate volume Performed at [place] [address] [place], [place] [address]    Culture   Final    NO GROWTH 2 DAYS Performed at [facility] [address] [place], [place] [address]    Report Status PENDING  Incomplete  Urine culture     Status: None    Collection Time: 09/29/19 12:58 PM   Specimen: Urine, Random  Result Value Ref Range Status   Specimen Description   Final    URINE, RANDOM Performed at [place] [address] [place], [place] [address]    Special Requests   Final    NONE Performed at [facility], [place] [address] [place], [place] [address]    Culture   Final    NO GROWTH Performed at [facility], [place] [address] [place], [place] [address]    Report Status 09/30/2019 FINAL  Final  Culture, blood (routine x 2)     Status: None (Preliminary result)   Collection Time: 09/29/19  2:47 PM   Specimen: BLOOD  Result Value Ref Range Status   Specimen Description   Final    BLOOD RIGHT ANTECUBITAL Performed at [place] [address], [place], [place] [address]    Special Requests   Final    BOTTLES DRAWN AEROBIC AND ANAEROBIC Blood Culture adequate volume Performed at [place] [address], [place], [place] [address]    Culture   Final    NO GROWTH 2 DAYS Performed at [place] [address] [place],  [address]    Report Status PENDING  Incomplete  SARS Coronavirus 2 by RT PCR (hospital order, performed in [facility] hospital lab) Nasopharyngeal Nasopharyngeal Swab     Status: None   Collection Time: 09/29/19  2:57 PM   Specimen: Nasopharyngeal Swab  Result Value Ref Range Status   SARS Coronavirus 2 NEGATIVE NEGATIVE Final    Comment: (NOTE) SARS-CoV-2 target nucleic acids are NOT DETECTED.  The SARS-CoV-2 RNA is generally detectable in upper and lower respiratory specimens during the acute phase of infection. The lowest concentration of SARS-CoV-2 viral copies this assay can detect is 250 copies / mL. A negative result does not preclude SARS-CoV-2 infection and should not be used as the sole basis for treatment or other patient management decisions.  A negative result  may occur  with improper specimen collection / handling, submission of specimen other than nasopharyngeal swab, presence of viral mutation(s) within the areas targeted by this assay, and inadequate number of viral copies (<250 copies / mL). A negative result must be combined with clinical observations, patient history, and epidemiological information.  Fact Sheet for Patients:   StrictlyIdeas.no  Fact Sheet for Healthcare Providers: BankingDealers.co.za  This test is not yet approved or  cleared by the [place] FDA and has been authorized for detection and/or diagnosis of SARS-CoV-2 by FDA under an Emergency Use Authorization (EUA).  This EUA will remain in effect (meaning this test can be used) for the duration of the COVID-19 declaration under Section 564(b)(1) of the Act, 21 U.S.C. section 360bbb-3(b)(1), unless the authorization is terminated or revoked sooner.  Performed at [facility], [place] [address], [place], [place] [address]   MRSA PCR Screening     Status: None   Collection Time: 09/29/19  8:45 PM   Specimen: Nasal Mucosa; Nasopharyngeal  Result Value Ref Range Status   MRSA by PCR NEGATIVE NEGATIVE Final    Comment:        The GeneXpert MRSA Assay (FDA approved for NASAL specimens only), is one component of a comprehensive MRSA colonization surveillance program. It is not intended to diagnose MRSA infection nor to guide or monitor treatment for MRSA infections. Performed at [facility], [place] [address] [place],  [address]      Time coordinating discharge: 35 minutes  SIGNED:   Cordelia Poche, MD [facility] 10/02/2019, 1:30 PM

## 2019-10-02 NOTE — Progress Notes (Signed)
   NAME:  Benjamin Cardenas, MRN:  [id number], DOB:  Oct 18, 1929, LOS: 3 ADMISSION DATE:  09/29/2019, CONSULTATION DATE:  6/28 REFERRING MD:  Lonny Prude, REASON FOR CONSULT:  Hypotension in setting of sepsis  Brief History   86 y/o male with an extensive urologic history was admitted in the setting of sepsis and acute kidney injury.  He was treated with bilateral percutaneous nephrostomy tubes on 6/25.  PCCM consulted for hypotension on 6/27, started on low dose neosynephrine.  Ongoing conversations with palliative medicine about disposition, considering inpatient hospice management.   Past Medical History  Prostate nodule, bladder cancer, BPH, CKD with bilateral hydronephrosis, Fe def anemia, Osteoarthritis, lumbar stenosis, allergic rhinitis, chronic low back pain  Chart reviewed, patient care discussed with bedside nurse.  The patient is currently on low dose neosynephrine, there are plans in place today to go to [facility].  The plan is that the patient will stop using neosynephrine at the time of discharge, bedside nurse currently administering fluids to reduce neosynephrine requirement.   PCCM available, prn  Roselie Awkward, MD [facility] Pager: [phone] Cell: [phone] If no response, call [phone]

## 2019-10-02 NOTE — TOC Progression Note (Signed)
Transition of Care [facility]) - Progression Note    Patient Details  Name: Benjamin Cardenas MRN: [id number] Date of Birth: 1930-03-15  Transition of Care [facility]) CM/SW Contact  Purcell Mouton, RN Phone Number: 10/02/2019, 3:22 PM  Clinical Narrative:     Corey Harold was called to transport pt to [facility]. Family was called.        Expected Discharge Plan and Services           Expected Discharge Date: 10/02/19                                     Social Determinants of Health (SDOH) Interventions    Readmission Risk Interventions No flowsheet data found.

## 2019-10-02 NOTE — Progress Notes (Signed)
[facility])   Request received from PMT to discuss d/c options for Mr. Benjamin Cardenas.  Spoke with Mrs. Schafer, she is very concerned about the ability to medically care for him when he discharges.  Discussed how he is increasingly less cognizant and he appears to be in the final days of his life.  She would like to proceed with moving him to [facility].    Plan will be to d/c IV fluids and vasoactive drips prior to transport arriving.  Once consents are completed, [facility] will update [facility] manager and hospital staff so pt can be transported to [facility].  Report can be called to [phone] at any time.  Please sent d/c summary to [place] RN, BSN, [facility] Liaison

## 2019-10-02 NOTE — Plan of Care (Signed)
Pt going to hospice

## 2019-10-02 NOTE — Progress Notes (Addendum)
Report called to [facility] at 1450. [facility] arrived to transport patient at 80. Wife at bedside, all questions answered. Pt departed unit at 1605.

## 2019-10-02 NOTE — TOC Progression Note (Signed)
Transition of Care [facility]) - Progression Note    Patient Details  Name: Benjamin Cardenas MRN: [id number] Date of Birth: 03-08-1930  Transition of Care [facility]) CM/SW Contact  Purcell Mouton, RN Phone Number: 10/02/2019, 9:45 AM  Clinical Narrative:     TOC follow for home disposition. Per chart family will meet to discuss home with Hospice vs Residential  Hospice. Waiting for final discuss.       Expected Discharge Plan and Services                                                 Social Determinants of Health (SDOH) Interventions    Readmission Risk Interventions No flowsheet data found.

## 2019-10-02 NOTE — Progress Notes (Signed)
Assessment: Bilateral hydronephrosis - with nephrostomy tubes indwelling he is having good urine output with a continued fall in his creatinine each day.  I agree with Dr. Purvis Sheffield assessment that there is currently no need for internalization of his nephrostomy tubes to double-J stents and therefore he will maintain bilateral nephrostomy tube drainage of his kidneys.  Small capacity bladder -he was felt to have inflammation causing decreased compliance of his bladder wall and this also is likely the cause of his bilateral ureteral obstruction at the level of the bladder.  He was having difficulty with urinary frequency however this should be improved with bilateral nephrostomy tube drainage making him more comfortable.  There did not appear to be any evidence of bladder infection with a negative urine culture.  Plan: 1.  Continue nephrostomy tube drainage. 2.  Per palliative care/hospice.   Subjective: Patient remains somnolent but arousable.  Mumbles but affirms he is not in any discomfort.  I did report to him about his continued improvement in his renal function.  Objective: Vital signs in last 24 hours: Temp:  [97.5 F (36.4 C)-98.1 F (36.7 C)] 97.7 F (36.5 C) (06/28 0400) Pulse Rate:  [77-105] 89 (06/28 0630) Resp:  [10-36] 17 (06/28 0630) BP: (64-172)/(25-156) 104/54 (06/28 0630) SpO2:  [82 %-100 %] 99 % (06/28 0635) Weight:  [55.3 kg] 55.3 kg (06/28 0500)  Intake/Output from previous day: 06/27 0701 - 06/28 0700 In: 2612.5 [I.V.:2526.7; IV Piggyback:85.8] Out: 2825 [Urine:2825] Intake/Output this shift: No intake/output data recorded.  Past Medical History:  Diagnosis Date  . BPH with urinary obstruction   . Chronic constipation   . Chronic kidney disease    hydtronephrosis  . Chronic low back pain   . Cognitive impairment   . Dry skin dermatitis   . Elevated PSA   . Foley catheter in place    July 14, 2019  . History of bladder cancer    urologist--- dr  Consuella Lose  . History of colon polyps   . History of sepsis    previous admission's for sepsis due to UTI's and CAP:   last admission's 05/ 2020 and 06/ 2020 sepsis secondary to UTI  . History of supraventricular tachycardia 11-03-2018  pt denies any symptoms since 2018   03/ 2018 during admission for CAP with sepsis-- episode of SVT;  pt cardiac evaluation after discharge 08-17-2016 (note in epic, b. bhagat PA),  normal echo,  event montior no sig. arrrhythmias heart rate range 36-120  . IDA (iron deficiency anemia)   . Lower urinary tract symptoms (LUTS)   . Lumbar stenosis   . OA (osteoarthritis)   . Prostate nodule   . Seasonal allergic rhinitis   . Urinary retention   . Wears glasses    Current Facility-Administered Medications  Medication Dose Route Frequency Provider Last Rate Last Admin  . 0.9 %  sodium chloride infusion  250 mL Intravenous Continuous Mariel Aloe, MD 20 mL/hr at 10/02/19 0530 250 mL at 10/02/19 0530  . acetaminophen (TYLENOL) tablet 650 mg  650 mg Oral Q6H PRN Oretha Milch D, MD       Or  . acetaminophen (TYLENOL) suppository 650 mg  650 mg Rectal Q6H PRN Oretha Milch D, MD   650 mg at 10/02/19 0508  . ceFEPIme (MAXIPIME) 2 g in sodium chloride 0.9 % 100 mL IVPB  2 g Intravenous Q24H Minda Ditto, RPH   Stopped at 10/01/19 1414  . Chlorhexidine Gluconate Cloth 2 % PADS 6  each  6 each Topical Q0600 Desiree Hane, MD   6 each at 10/02/19 0530  . fentaNYL (SUBLIMAZE) injection 12.5 mcg  12.5 mcg Intravenous Q3H PRN Loistine Chance, MD   12.5 mcg at 10/02/19 0508  . heparin injection 5,000 Units  5,000 Units Subcutaneous Q8H Oretha Milch D, MD   5,000 Units at 10/02/19 0508  . MEDLINE mouth rinse  15 mL Mouth Rinse BID Mariel Aloe, MD   15 mL at 10/01/19 2201  . ondansetron (ZOFRAN) tablet 4 mg  4 mg Oral Q6H PRN Oretha Milch D, MD       Or  . ondansetron (ZOFRAN) injection 4 mg  4 mg Intravenous Q6H PRN Oretha Milch D, MD      . phenylephrine  (NEOSYNEPHRINE) 10-0.9 MG/250ML-% infusion  25-200 mcg/min Intravenous Titrated Mariel Aloe, MD 37.5 mL/hr at 10/02/19 0640 25 mcg/min at 10/02/19 0640  . senna-docusate (Senokot-S) tablet 1 tablet  1 tablet Oral QHS PRN Oretha Milch D, MD      . sodium chloride 0.9 % bolus 250 mL  250 mL Intravenous Once Hayden Rasmussen, MD      . sodium chloride flush (NS) 0.9 % injection 5 mL  5 mL Intracatheter Q8H Sandi Mariscal, MD   5 mL at 10/02/19 5885    Physical Exam:  General: Patient is in no apparent distress Lungs: Normal respiratory effort, chest expands symmetrically. GI: The abdomen is soft and nontender.    Lab Results: Recent Labs    09/29/19 1258 09/30/19 0303 10/01/19 0819  WBC 16.6* 15.4* 9.1  HGB 14.9 14.3 11.7*  HCT 42.5 41.1 32.9*   BMET Recent Labs    10/01/19 0502 10/02/19 0345  NA 140 151*  K 4.5 3.9  CL 107 120*  CO2 23 24  GLUCOSE 149* 98  BUN 146* 90*  CREATININE 2.81* 1.82*  CALCIUM 8.7* 8.2*   Recent Labs    09/29/19 1258  INR 0.9   No results for input(s): LABURIN in the last 72 hours. Results for orders placed or performed during the hospital encounter of 09/29/19  Culture, blood (routine x 2)     Status: None (Preliminary result)   Collection Time: 09/29/19 12:58 PM   Specimen: BLOOD  Result Value Ref Range Status   Specimen Description   Final    BLOOD LEFT ANTECUBITAL Performed at [place] [address], [place], [place] [address]    Special Requests   Final    BOTTLES DRAWN AEROBIC AND ANAEROBIC Blood Culture adequate volume Performed at [place] [address], [place], [place] [address]    Culture   Final    NO GROWTH 2 DAYS Performed at [facility] [address], [place], [place] [address]    Report Status PENDING  Incomplete  Urine culture     Status: None   Collection Time: 09/29/19 12:58 PM   Specimen: Urine, Random  Result Value Ref Range Status   Specimen  Description   Final    URINE, RANDOM Performed at [facility] [address], [place], [place] [address]    Special Requests   Final    NONE Performed at [facility], [place] [address] [place], [place] [address]    Culture   Final    NO GROWTH Performed at [facility], [place] [address], [place], [place] [address]    Report Status 09/30/2019 FINAL  Final  Culture, blood (routine x 2)     Status: None (Preliminary result)   Collection Time: 09/29/19  2:47 PM   Specimen: BLOOD  Result Value Ref Range Status   Specimen Description   Final    BLOOD RIGHT ANTECUBITAL Performed at [place] [address], [place], [place] [address]    Special Requests   Final    BOTTLES DRAWN AEROBIC AND ANAEROBIC Blood Culture adequate volume Performed at [place] [address] [place], [place] [address]    Culture   Final    NO GROWTH 2 DAYS Performed at [facility] [address], [place], [place] [address]    Report Status PENDING  Incomplete  SARS Coronavirus 2 by RT PCR (hospital order, performed in [facility] hospital lab) Nasopharyngeal Nasopharyngeal Swab     Status: None   Collection Time: 09/29/19  2:57 PM   Specimen: Nasopharyngeal Swab  Result Value Ref Range Status   SARS Coronavirus 2 NEGATIVE NEGATIVE Final    Comment: (NOTE) SARS-CoV-2 target nucleic acids are NOT DETECTED.  The SARS-CoV-2 RNA is generally detectable in upper and lower respiratory specimens during the acute phase of infection. The lowest concentration of SARS-CoV-2 viral copies this assay can detect is 250 copies / mL. A negative result does not preclude SARS-CoV-2 infection and should not be used as the sole basis for treatment or other patient management decisions.  A negative result may occur with improper specimen collection / handling, submission of specimen other than nasopharyngeal swab, presence of  viral mutation(s) within the areas targeted by this assay, and inadequate number of viral copies (<250 copies / mL). A negative result must be combined with clinical observations, patient history, and epidemiological information.  Fact Sheet for Patients:   StrictlyIdeas.no  Fact Sheet for Healthcare Providers: BankingDealers.co.za  This test is not yet approved or  cleared by the [place] FDA and has been authorized for detection and/or diagnosis of SARS-CoV-2 by FDA under an Emergency Use Authorization (EUA).  This EUA will remain in effect (meaning this test can be used) for the duration of the COVID-19 declaration under Section 564(b)(1) of the Act, 21 U.S.C. section 360bbb-3(b)(1), unless the authorization is terminated or revoked sooner.  Performed at [facility], [place] [address], [place], [place] [address]   MRSA PCR Screening     Status: None   Collection Time: 09/29/19  8:45 PM   Specimen: Nasal Mucosa; Nasopharyngeal  Result Value Ref Range Status   MRSA by PCR NEGATIVE NEGATIVE Final    Comment:        The GeneXpert MRSA Assay (FDA approved for NASAL specimens only), is one component of a comprehensive MRSA colonization surveillance program. It is not intended to diagnose MRSA infection nor to guide or monitor treatment for MRSA infections. Performed at [facility], [place] [address], [place], [place] [address]     Studies/Results: No results found.     Claybon Jabs 10/02/2019, 7:27 AM

## 2019-10-02 NOTE — Progress Notes (Signed)
Daily Progress Note   Patient Name: Benjamin Cardenas       Date: 10/02/2019 DOB: May 01, 1929  Age: 86 y.o. MRN#: [id number] Attending Physician: Mariel Aloe, MD Primary Care Physician: Isaac Bliss, Rayford Halsted, MD Admit Date: 09/29/2019  Reason for Consultation/Follow-up: Establishing goals of care  Subjective: Resting in bed, calm currently, not with non verbal gestures of distress or discomfort. Wife and son at bedside. Patient awaiting transfer to [facility] residential hospice.   Length of Stay: 3  Current Medications: Scheduled Meds:   Chlorhexidine Gluconate Cloth  6 each Topical Q0600   heparin  5,000 Units Subcutaneous Q8H   mouth rinse  15 mL Mouth Rinse BID   sodium chloride flush  5 mL Intracatheter Q8H    Continuous Infusions:  sodium chloride 250 mL (10/02/19 0530)   ceFEPime (MAXIPIME) IV Stopped (10/01/19 1414)   dextrose 5 % and 0.45% NaCl 100 mL/hr at 10/02/19 1200   phenylephrine (NEO-SYNEPHRINE) Adult infusion 25 mcg/min (10/02/19 1200)   sodium chloride      PRN Meds: acetaminophen **OR** acetaminophen, fentaNYL (SUBLIMAZE) injection, ondansetron **OR** ondansetron (ZOFRAN) IV, senna-docusate  Physical Exam         Appears frail, less alert  Tachycardic on the monitor Appears to have regular work of breathing Calm currently Has bilateral nephrostomy tubes with dark urine currently Does not have peripheral edema  Vital Signs: BP 119/67    Pulse 75    Temp (!) 97.2 F (36.2 C) (Axillary)    Resp 11    Ht 5\' 6"  (1.676 m)    Wt 55.3 kg    SpO2 99%    BMI 19.68 kg/m  SpO2: SpO2: 99 % O2 Device: O2 Device: Room Air O2 Flow Rate:    Intake/output summary:   Intake/Output Summary (Last 24 hours) at 10/02/2019 1325 Last data filed at  10/02/2019 1200 Gross per 24 hour  Intake 2819 ml  Output 2475 ml  Net 344 ml   LBM: Last BM Date:  (PTA) Baseline Weight: Weight: 55.1 kg Most recent weight: Weight: 55.3 kg       Palliative Assessment/Data:      Patient Active Problem List   Diagnosis Date Noted   Comfort measures only status 10/02/2019   Palliative care by specialist    Goals of care, counseling/discussion  General weakness    Toxic metabolic encephalopathy 84/16/6063   Sepsis ([place]) 09/29/2019   Hypercalcemia 01/60/1093   Metabolic acidosis 23/55/7322   Elevated troponin 09/29/2019   Hyperkalemia 07/04/2019   Vitamin D deficiency 01/27/2019   BPH with urinary obstruction 11/07/2018   Mild renal insufficiency 09/21/2018   Sepsis due to urinary tract infection ([place]) 08/14/2018   Sepsis secondary to UTI ([place]) 08/13/2018   AKI (acute kidney injury) ([place]) 08/13/2018   Acute encephalopathy 08/13/2018   Bladder outlet obstruction 08/13/2018   AAA (abdominal aortic aneurysm) without rupture ([place]) 08/13/2018   MRSA (methicillin resistant Staphylococcus aureus) carrier 05/30/2018   Bladder cancer ([place]) 03/10/2018   CAP (community acquired pneumonia) 06/26/2016   Hyponatremia 12/13/2015   Anemia 12/13/2015   Altered mental status 12/13/2015   Constipation 08/17/2015   Memory impairment 12/04/2014   Keratosis pilaris 02/54/2706   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  Patient with functional decline, low blood pressures, restlessness and agitation at times, currently n.p.o. and awaiting speech therapy evaluations.   Recommendations/Plan:  Palliative services following for goals of care discussions.  Discussed with patient's wife and son present at the bedside.   Appreciate hospice liaison reaching out the patient's wife, at present, plans are being made for transfer to residential hospice. Wife did a brief life review of the patient, shared his life story with [place]. We talked about end of life signs and symptoms. We discussed about the type of care that can be provided in a residential hospice setting, discussed about following vital signs and patient's appearance and not relying on lab work anymore, discussed about limited prognosis and efforts being made to have the patient transition to residential hospice.    Code Status:    Code Status Orders  (From admission, onward)         Start     Ordered   09/29/19 1914  Do not attempt resuscitation (DNR)  Continuous       Question Answer Comment  In the event of cardiac or respiratory ARREST Do not call a code blue   In the event of cardiac or respiratory ARREST Do not perform Intubation, CPR, defibrillation or ACLS   In the event of cardiac or respiratory ARREST Use medication by any route, position, wound care, and other measures to relive pain and suffering. May use oxygen, suction and manual treatment of airway obstruction as needed for comfort.      09/29/19 1914        Code Status History    Date Active Date Inactive Code Status Order ID Comments User Context   09/29/2019 1644 09/29/2019 1914 DNR [id number]  Desiree Hane, MD ED   11/07/2018 1038 11/08/2018 1424 Full Code [id number]  Kathie Rhodes, MD Inpatient   09/21/2018 0440 09/23/2018 1841 Full Code [id number]  Vianne Bulls, MD Inpatient   08/13/2018 2245 08/17/2018 1749 Full Code [id number]  Vianne Bulls, MD  ED   06/26/2016 1943 06/29/2016 1905 Full Code [id number]  Etta Quill, DO  ED   12/13/2015 2155 12/17/2015 2053 Full Code [id number]  Reubin Milan, MD Inpatient   Advance Care Planning Activity    Advance Directive Documentation     Most Recent Value  Type of Advance Directive Healthcare Power of Attorney, Living will  Pre-existing out of facility DNR order (yellow form or pink MOST form) --  "MOST" Form in Place? --       Prognosis:   less than 2 weeks.   Discharge Planning:    residential hospice.   Care plan was discussed with patient's wife, son, RN and hospice liaison.   Thank you for allowing the Palliative Medicine Team to assist in the care of this patient.   Time In: 12 Time Out: 12.35 Total Time 35 Prolonged Time Billed  no       Greater than 50%  of this time was spent counseling and coordinating care related to the above assessment and plan.  Loistine Chance, MD  Please contact Palliative Medicine Team phone at [phone] for questions and concerns.

## 2019-10-04 LAB — CULTURE, BLOOD (ROUTINE X 2)
Culture: NO GROWTH
Culture: NO GROWTH
Special Requests: ADEQUATE
Special Requests: ADEQUATE

## 2019-10-10 ENCOUNTER — Telehealth: Payer: Self-pay | Admitting: Internal Medicine

## 2019-10-10 NOTE — Telephone Encounter (Signed)
Pts spouse called in stating that the pt went from [facility] to [facility] on 07/20/2022 10/23/19 and passed on October 26, 2019.  Spouse just wanted to inform the provider and CMA that of the passing.

## 2019-10-11 NOTE — Telephone Encounter (Signed)
Spoke with wife

## 2019-10-11 NOTE — Telephone Encounter (Signed)
Very sorry to hear that.  

## 2019-10-20 ENCOUNTER — Ambulatory Visit: Payer: TRICARE For Life (TFL) | Admitting: Podiatry

## 2019-11-05 DEATH — deceased

## 2020-02-11 IMAGING — CT CT HEAD WITHOUT CONTRAST
3 series · 16 of 47 positions shown, 19 images · non-contrast
Comparison: None.

CLINICAL DATA: Dizziness sepsis secondary to UTI.

EXAM:
CT HEAD WITHOUT CONTRAST
TECHNIQUE: Contiguous axial images were obtained from the base of the skull
through the vertex without intravenous contrast.

[Series 2: head wo · axial · 0.47mm/px · z∈[-106,+24]mm · 10 of 32 slices shown, 13 images]
[im 3/32  brain]
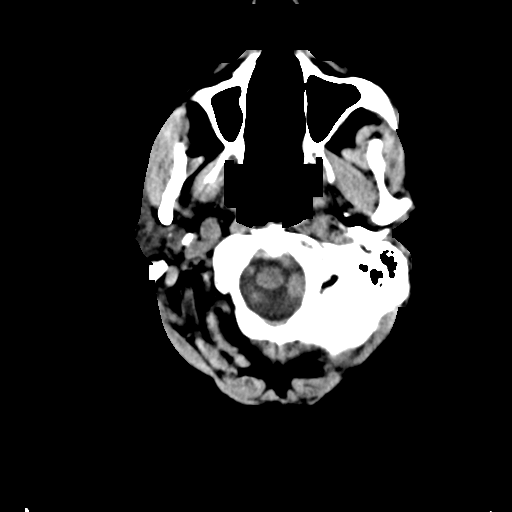
[im 3/32  bone]
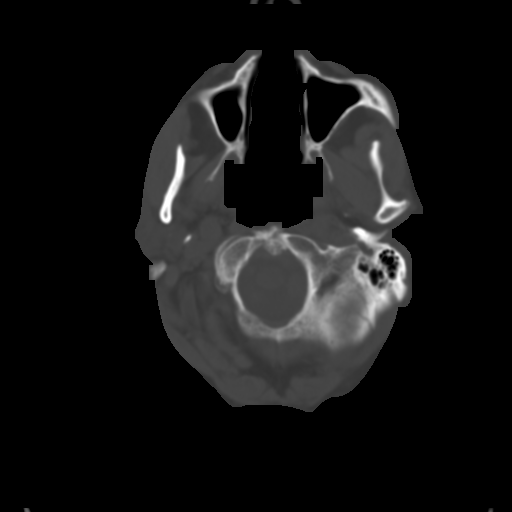
[im 6/32  brain]
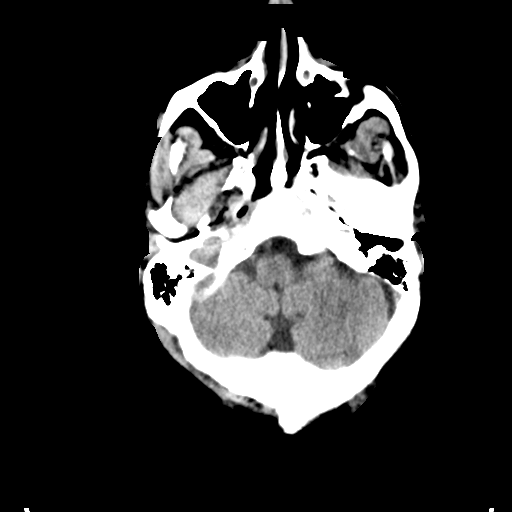
[im 9/32  brain]
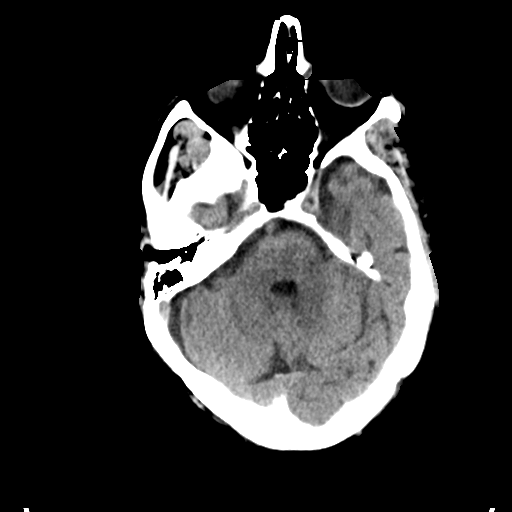
[im 11/32  brain]
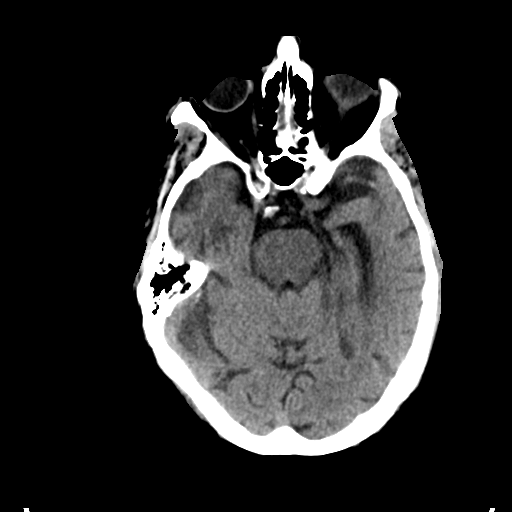
[im 14/32  brain]
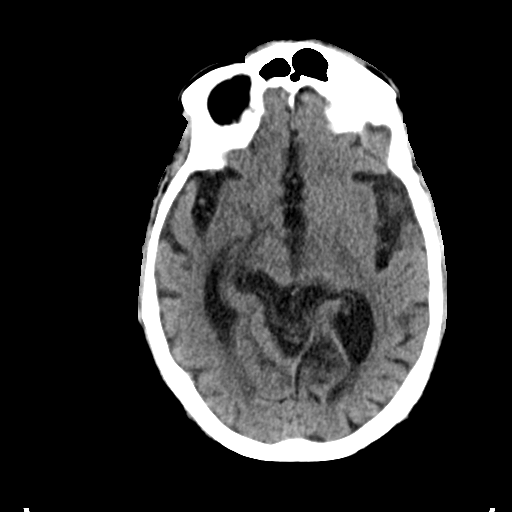
[im 14/32  bone]
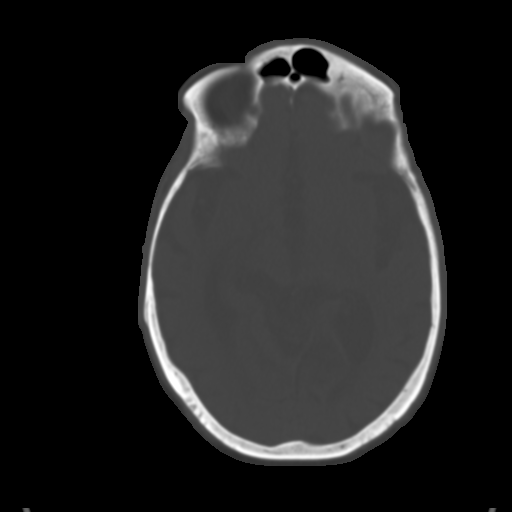
[im 18/32  brain]
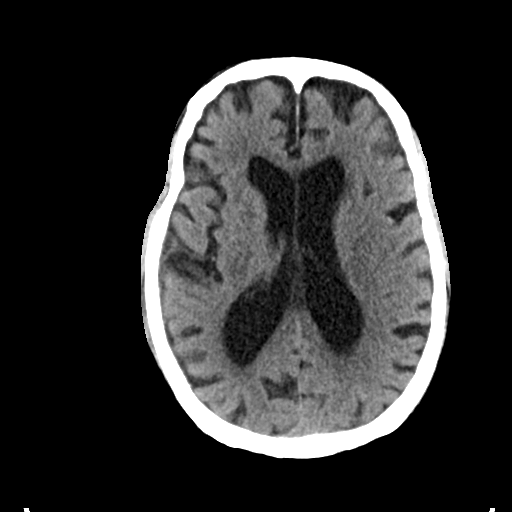
[im 21/32  brain]
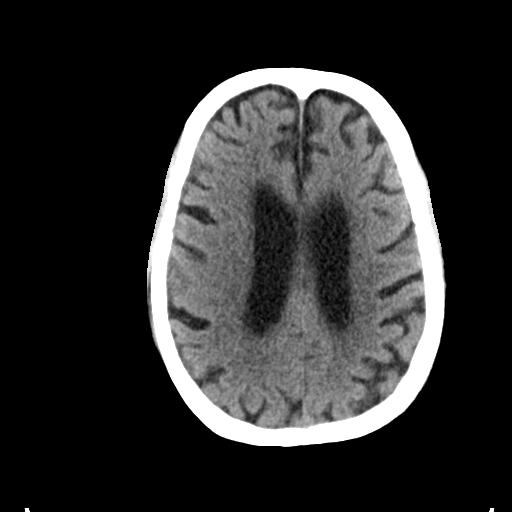
[im 24/32  brain]
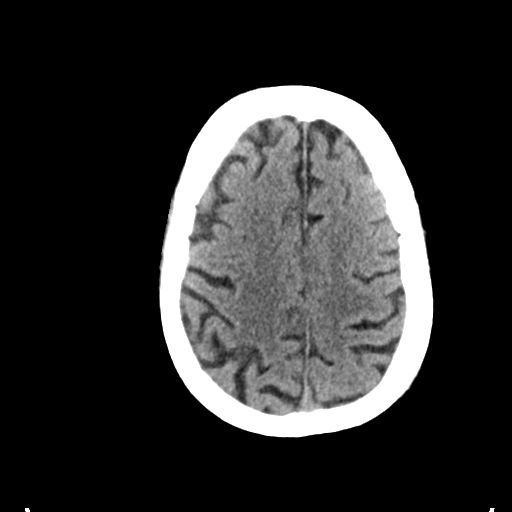
[im 26/32  brain]
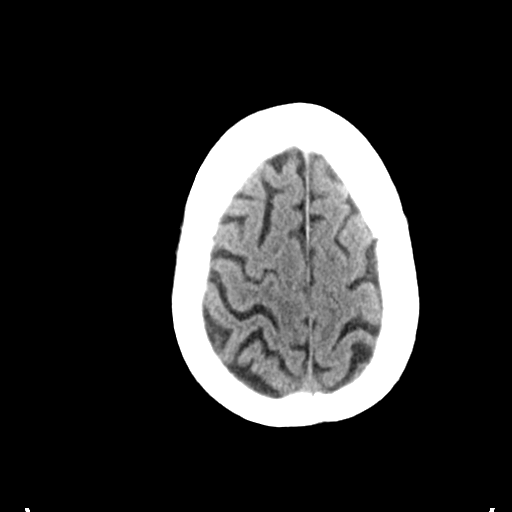
[im 26/32  bone]
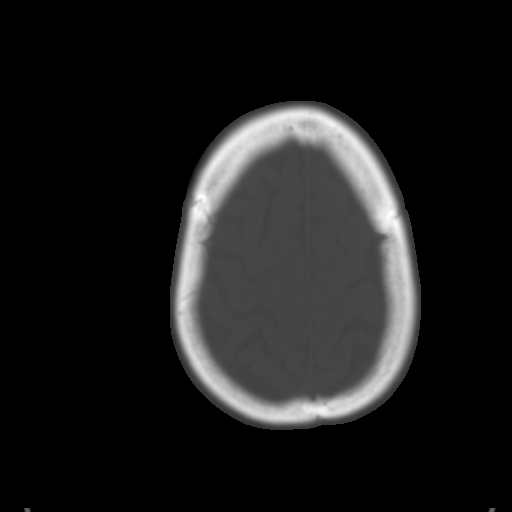
[im 29/32  brain]
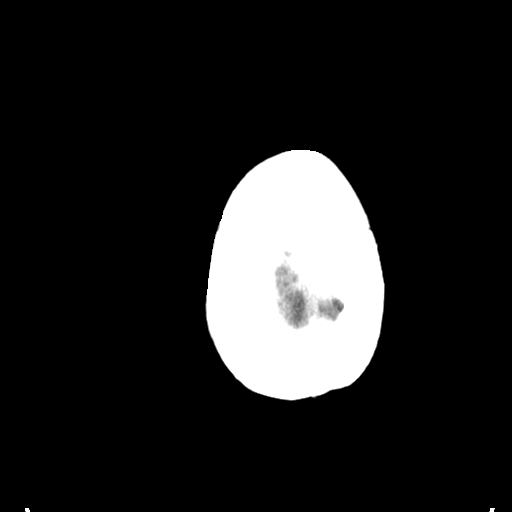

[Series 4: coronal soft tissue · coronal · 0.32mm/px · 3 of 70 slices shown]
[im 24/70  brain]
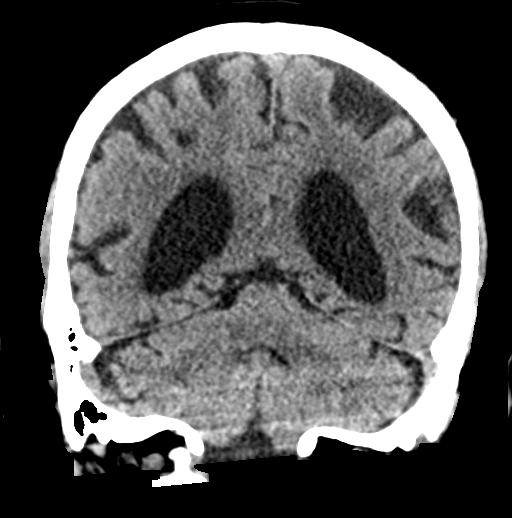
[im 31/70  brain]
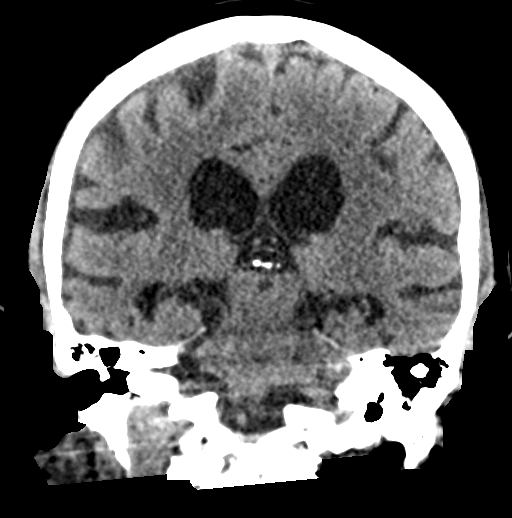
[im 39/70  brain]
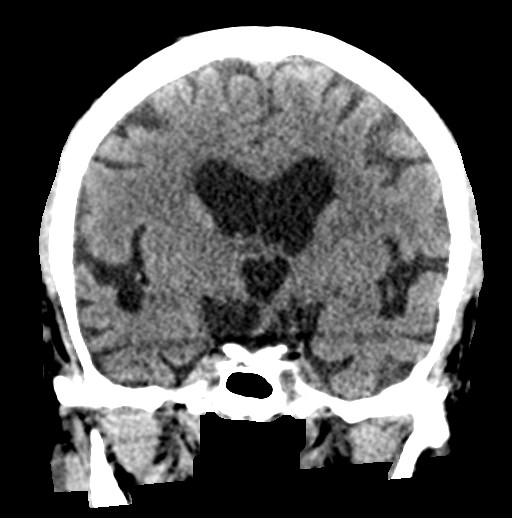

[Series 5: sagittal soft tissue · sagittal · 0.32mm/px · 3 of 55 slices shown]
[im 19/55  brain]
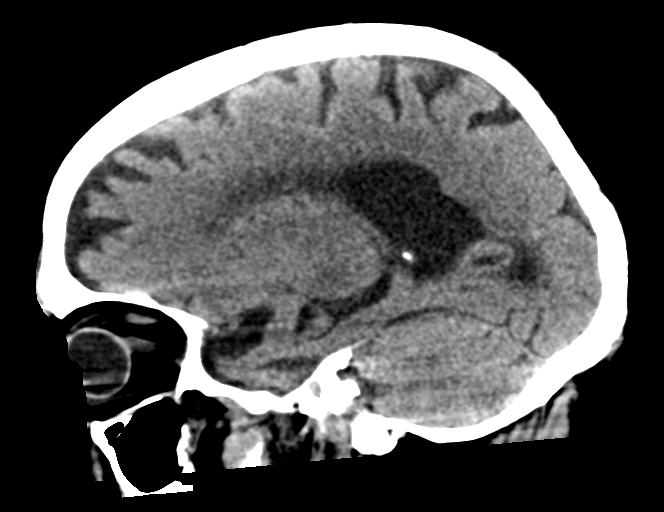
[im 28/55  brain]
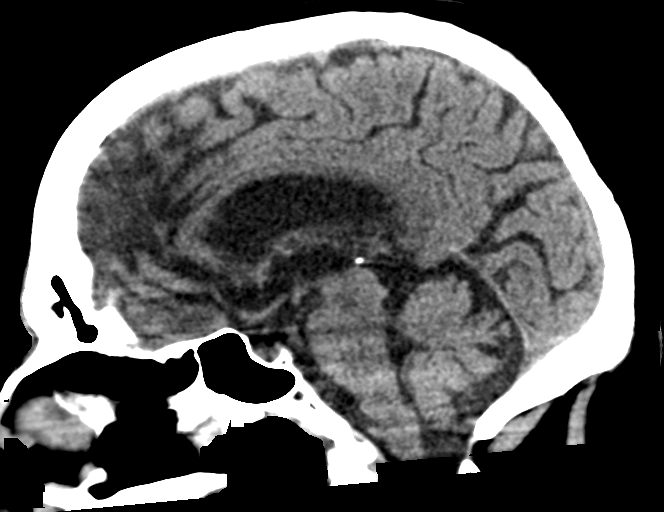
[im 37/55  brain]
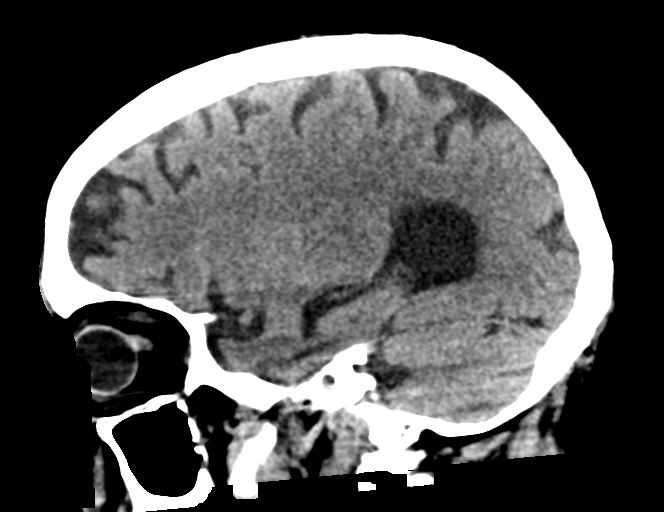

[16 of 47 positions shown; findings below may reference images not displayed]

FINDINGS: Brain: No evidence of acute infarction, hemorrhage, hydrocephalus,
extra-axial collection or mass lesion/mass effect. There is
significant atrophy and chronic microvascular ischemic changes
bilaterally.

Vascular: No hyperdense vessel or unexpected calcification.

Skull: Normal. Negative for fracture or focal lesion.

Sinuses/Orbits: No acute finding. The patient is status post
bilateral cataract surgery.

Other: None.
IMPRESSION: 1. No acute intracranial abnormality.
2. Significant volume loss with associated chronic microvascular
ischemic changes.
